# Patient Record
Sex: Female | Born: 1935
Health system: Southern US, Community
[De-identification: ages and names within clinical notes are randomized; demographics above are authoritative.]

## PROBLEM LIST (undated history)

## (undated) DIAGNOSIS — R197 Diarrhea, unspecified: Secondary | ICD-10-CM

## (undated) DIAGNOSIS — R51 Headache: Secondary | ICD-10-CM

## (undated) DIAGNOSIS — K219 Gastro-esophageal reflux disease without esophagitis: Secondary | ICD-10-CM

## (undated) DIAGNOSIS — I7300 Raynaud's syndrome without gangrene: Secondary | ICD-10-CM

## (undated) DIAGNOSIS — Z974 Presence of external hearing-aid: Secondary | ICD-10-CM

## (undated) DIAGNOSIS — I10 Essential (primary) hypertension: Secondary | ICD-10-CM

## (undated) DIAGNOSIS — R0602 Shortness of breath: Secondary | ICD-10-CM

## (undated) DIAGNOSIS — M1990 Unspecified osteoarthritis, unspecified site: Secondary | ICD-10-CM

## (undated) HISTORY — PX: FINGER SURGERY: SHX640

## (undated) HISTORY — DX: Presence of external hearing-aid: Z97.4

## (undated) HISTORY — PX: CATARACT EXTRACTION W/ INTRAOCULAR LENS  IMPLANT, BILATERAL: SHX1307

## (undated) HISTORY — PX: KNEE ARTHROSCOPY: SUR90

## (undated) HISTORY — PX: FACIAL COSMETIC SURGERY: SHX629

## (undated) HISTORY — PX: FOOT SURGERY: SHX648

## (undated) HISTORY — PX: TUBAL LIGATION: SHX77

---
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
## 1998-10-13 ENCOUNTER — Other Ambulatory Visit: Admission: RE | Admit: 1998-10-13 | Discharge: 1998-10-13 | Payer: Self-pay | Admitting: Obstetrics and Gynecology

## 1999-11-29 ENCOUNTER — Other Ambulatory Visit: Admission: RE | Admit: 1999-11-29 | Discharge: 1999-11-29 | Payer: Self-pay | Admitting: Obstetrics and Gynecology

## 2000-02-27 ENCOUNTER — Ambulatory Visit (HOSPITAL_COMMUNITY): Admission: RE | Admit: 2000-02-27 | Discharge: 2000-02-27 | Payer: Self-pay | Admitting: *Deleted

## 2001-02-25 ENCOUNTER — Other Ambulatory Visit: Admission: RE | Admit: 2001-02-25 | Discharge: 2001-02-25 | Payer: Self-pay | Admitting: Obstetrics and Gynecology

## 2002-03-04 ENCOUNTER — Other Ambulatory Visit: Admission: RE | Admit: 2002-03-04 | Discharge: 2002-03-04 | Payer: Self-pay | Admitting: Obstetrics and Gynecology

## 2002-08-05 ENCOUNTER — Ambulatory Visit (HOSPITAL_BASED_OUTPATIENT_CLINIC_OR_DEPARTMENT_OTHER): Admission: RE | Admit: 2002-08-05 | Discharge: 2002-08-06 | Payer: Self-pay | Admitting: Orthopedic Surgery

## 2002-11-16 HISTORY — PX: VAGINAL HYSTERECTOMY: SUR661

## 2002-11-18 ENCOUNTER — Encounter (INDEPENDENT_AMBULATORY_CARE_PROVIDER_SITE_OTHER): Payer: Self-pay | Admitting: Specialist

## 2002-11-18 ENCOUNTER — Observation Stay (HOSPITAL_COMMUNITY): Admission: RE | Admit: 2002-11-18 | Discharge: 2002-11-19 | Payer: Self-pay | Admitting: Obstetrics and Gynecology

## 2003-07-10 ENCOUNTER — Encounter: Payer: Self-pay | Admitting: Diagnostic Radiology

## 2003-07-10 ENCOUNTER — Encounter: Admission: RE | Admit: 2003-07-10 | Discharge: 2003-07-10 | Payer: Self-pay | Admitting: Neurology

## 2003-07-10 ENCOUNTER — Encounter: Payer: Self-pay | Admitting: Neurology

## 2003-08-19 ENCOUNTER — Encounter: Admission: RE | Admit: 2003-08-19 | Discharge: 2003-08-19 | Payer: Self-pay | Admitting: Neurology

## 2003-10-23 ENCOUNTER — Encounter: Admission: RE | Admit: 2003-10-23 | Discharge: 2003-10-23 | Payer: Self-pay | Admitting: Neurology

## 2004-08-01 ENCOUNTER — Ambulatory Visit (HOSPITAL_COMMUNITY): Admission: RE | Admit: 2004-08-01 | Discharge: 2004-08-01 | Payer: Self-pay | Admitting: Neurology

## 2004-10-16 HISTORY — PX: BACK SURGERY: SHX140

## 2005-05-10 ENCOUNTER — Encounter (INDEPENDENT_AMBULATORY_CARE_PROVIDER_SITE_OTHER): Payer: Self-pay | Admitting: Specialist

## 2005-05-10 ENCOUNTER — Ambulatory Visit (HOSPITAL_COMMUNITY): Admission: RE | Admit: 2005-05-10 | Discharge: 2005-05-10 | Payer: Self-pay | Admitting: *Deleted

## 2008-04-16 ENCOUNTER — Other Ambulatory Visit: Admission: RE | Admit: 2008-04-16 | Discharge: 2008-04-16 | Payer: Self-pay | Admitting: Obstetrics & Gynecology

## 2011-03-03 NOTE — Op Note (Signed)
NAMEAYDE, RECORD             ACCOUNT NO.:  [id number]   MEDICAL RECORD NO.:  [id number]          PATIENT TYPE:  AMB   LOCATION:  ENDO                         FACILITY:  [facility]   PHYSICIAN:  Georgiana Spinner, M.D.    DATE OF BIRTH:  [birth year]   DATE OF PROCEDURE:  05/10/2005  DATE OF DISCHARGE:                                 OPERATIVE REPORT   PROCEDURE:  Upper endoscopy.   INDICATIONS:  Gastroesophageal reflux disease.   ANESTHESIA:  Demerol 50, Versed 5 mg.   DESCRIPTION OF PROCEDURE:  With the patient mildly sedated in the left  lateral decubitus position, the Olympus videoscopic endoscope was inserted  in the mouth and passed under direct vision through the esophagus which  appeared normal. We biopsied around the perimeter of the squamocolumnar  junction and then entered into the stomach. The fundus and body appeared  normal. Antrum showed a thickened fold that we photographed and biopsied.  The duodenal bulb and second portion of duodenum appeared normal. From this  point, the endoscope was slowly withdrawn taking circumferential views of  the duodenal mucosa until the endoscope had been pulled back into the  stomach, placed in retroflexion to view the stomach from below and the GE  junction was widely patent lax and we could see up the esophagus. The  endoscope was then straightened and withdrawn taking circumferential views  of the remaining gastric and esophageal mucosa. The patient's vital signs  and pulse oximeter remained stable. The patient tolerated the procedure well  without apparent complications.   FINDINGS:  Erythema of antral fold biopsied, await biopsy report and biopsy  of the distal esophagus, await biopsy report. The patient will call me for  results and follow-up with me as an outpatient. Proceed to colonoscopy as  planned.       GMO/MEDQ  D:  05/10/2005  T:  05/10/2005  Job:  [id number]

## 2011-03-03 NOTE — Op Note (Signed)
[facility]  Patient:    [name], [name]                      MRN: [id number] Adm. Date:  [id number] Attending:  Sabino Gasser                           Operative Report  PROCEDURE:  Colonoscopy.  INDICATIONS:  Rectal bleeding, family history of colon cancer.  ANESTHESIA:  Demerol 40 mg, Versed 5 mg was given intravenously in divided dose.  DESCRIPTION OF PROCEDURE:  With the patient mildly sedated in the left lateral decubitus position, the Olympus videoscopic pediatric colonoscope was inserted into the rectum and passed under direct vision to the cecum.  The cecum was identified by ileocecal valve and appendiceal orifice, both of which were photographed.  We entered into the terminal ileum easily and through the ileocecal valve.  These, too, appeared normal and were photographed.  From this point, the colonoscope was slowly withdrawn, taking circumferential views of the entire colonic mucosa, stopping only then in the rectum which appeared normal under direct view and showed internal hemorrhoids on retroflex view. The endoscope was straightened and withdrawn.  The patients vital signs and pulse oximeter remained stable.  The patient tolerated the procedure well without apparent complications.  FINDINGS:  Internal hemorrhoids, rare diverticulum seen in sigmoid colon; otherwise, unremarkable colonoscopic examination to the cecum.  PLAN:  Repeat examination in three to five years. DD:  02/27/00 TD:  02/28/00 Job: [id number]

## 2011-03-03 NOTE — Op Note (Signed)
NAMEANDRES, [name]                         ACCOUNT NO.:  [id number]   MEDICAL RECORD NO.:  [id number]                   PATIENT TYPE:  AMB   LOCATION:  DSC                                  FACILITY:  [facility]   PHYSICIAN:  Katy Fitch. Naaman Plummer., M.D.          DATE OF BIRTH:  1933/03/01   DATE OF PROCEDURE:  08/05/2002  DATE OF DISCHARGE:                                 OPERATIVE REPORT   PREOPERATIVE DIAGNOSIS:  Destructive arthropathy, right thumb  metacarpophalangeal joint and right index proximal intraphalangeal joint  with components of hypertrophic osteoarthritis and destructive rheumatoid  arthritis.   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   OPERATION:  1. Proximal interphalangeal joint synovectomy and removal of loose bodies     and  hypertrophic osteophytes, right  index  finger followed by proximal     interphalangeal joint implant arthroplasty utilizing a size 10 pyrocarbon     implant set.  2. Arthrodesis of right thumb metacarpophalangeal joint with incidental     synovectomy, loose body excision and 27.5 mm AccuTrac standard sized     screw fixation with 0.025 inch Kirschner wire fixation.   SURGEON:  Katy Fitch. Sypher, M.D.   ASSISTANT:  Jonni Sanger, P.A.   ANESTHESIA:  Axillary block.   SUPERVISING ANESTHESIOLOGIST:  Janetta Hora. Gelene Mink, M.D.   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 She presented for a consultation requesting evaluation and  management options for this predicament. She was noted to have an extremely  destructive and painful arthropathy affecting her thumb, MP and index finger  proximal interphalangeal joints.   We  reviewed options for both predicaments. In the thumb, arthrodesis of the  thumb at the metacarpophalangeal joint in my judgment represents the most  appropriate choice. In the index finger, arthrodesis is a very reasonable  choice; however, due to her desire to maintain some motion of the PIP joint  due to severe arthropathy at the DIP joint as well, we recommended an  attempt at implant arthroplasty of the PIP joint utilizing our new  generation of pyrocarbon implants.  Preoperatively she was advised that this  would be an extremely difficult procedure from a technical standpoint, given  the fact that her intramedullary canals are obstructed with hypertrophic  bone.   After a lengthy informed consent in the office and a second discussion of  these issues prior to surgery, she is brought to the operating room at this  time. She understands that there are potential complications with implant  arthroplasty, including fracture of the implants, loosening, infection,  and/or development of hypertrophic osteophytes rendering the joints stiff  following surgery.  After informed consent and a period of  questions being  invited and answered, she was brought to the operating room at this time.   DESCRIPTION OF PROCEDURE:  The patient was brought to the operating room and  placed in the supine position on the operating table. Following axillary  block in the holding area by Dr. Gelene Mink, anesthesia was satisfactory in  the right arm.  The arm was prepped with Betadine soap solution and  sterilely draped.   Following exsanguination of the right arm with an Esmarch bandage, an  arterial tourniquet on the proximal brachium was inflated to 220 mmHg. The  procedure commenced with a curvilinear incision at the dorsal aspect of the  thumb. The interval between the extensor pollicis brevis and the extensor  pollicis longus was split longitudinally revealing the capsule of the MP  joint. This was bulging  with inflammatory tenosynovium and synovium.   A complete synovectomy of the MP joint was accomplished followed by release  of collateral ligaments. After hypertrophic osteophytes, the loose bodies  were removed. The 12-mm reamers, female and female, were used to shape the  metacarpal head and the proximal phalanx. This would allow arthrodesis of  the joint in approximately 15 to 20 degrees of flexion.   Preoperatively there was a significant radial deviation deformity of the  joint. I elected to correct this approximately 50%. Further correction in my  judgment may alter her pinch pattern due to deformity of the index finger;  therefore, in my judgment, accepting some degree of radial deviation was an  appropriate choice.   The joint was set at 15 degrees flexion and a 27-mm AccuTrac screw was  placed with standard technique, securing the arthrodesis site with excellent  compression. Rotation was controlled with a secondary 0.025 inch Kirschner  wire placed across the joint with x-ray control.   The capsule was then irrigated and repaired with mattress sutures of 3-0  Ethibond, followed by repair of the interval between the extensor pollicis  brevis and the extensor pollicis longus with figure-of-eight sutures of 3-0  Ethibonds, knots buried.  The wound was repaired with intradermal 3-0  Prolene.   Attention was then directed to the index finger PIP joint. There was a very  severe corkscrew deformity of the finger with considerable ulnar deviation  of the PIP joint and radial deviation of the DIP joint with flexion  deformities of both joints.  The PIP joint was exposed through a dorsal  curvilinear incision followed by use of a Chamay apex proximal release of  the central slip. Care was taken to preserve  the insertion of the central  slip at the middle phalanx.   The extremely deformed joint was exposed after partial release of collateral ligaments.  The proximal phalanx was  prepared in the standard manner for a  PIP implant utilizing the pyrocarbon rasps and cutting guides. Likewise the  middle phalanx was  prepared. The middle phalanx was prepared entirely  freehand due to severe deformity requiring a technique of insetting the  distal implant into the metaphyseal segment of the middle phalanx.  Ultimately, size 10 trials were able to be placed anatomically with  excellent correction of the valgus deformity and correction of the rotation  deformity as well.   The implants were placed with no-touch technique, followed by use of triple  antibiotic solution for irrigation. A 0.045  inch Kirschner wire was placed  down the flexor sheath in the manner of Swanson to maintain full extension  of the PIP joint during  extensive tendon healing and repair.   The central slip was then repaired with a reinforcing suture at the  insertion at the middle phalanx due to the thin bone at this level from the  significant hypertrophic osteophytes it had  formed preoperatively. This was  reinforced utilizing a 28 gauge K-wire and a loop mattress suture at the  central slip insertion distally. The central slip was repaired with a series  of  figure-of-eight sutures, knots buried of 3-0 Ethibond. A very  satisfactory reconstruction of the extensor mechanism was accomplished.   The wound was then irrigated with triple antibiotic solution followed by  repair of the skin with intradermal 3-0 Prolene.  The tourniquet was  released prior to closure of the skin  and the index finger. A small  hematoma collected on the dorsal aspect of the thumb MP joint that was  relieved with a hemostat and placement of a vessel loop drain.   The patient tolerated the surgery well. The total tourniquet time was  approximately 2 hours and 10 minutes at 220 mmHg. There were no apparent  complications.                                               Katy Fitch Naaman Plummer., M.D.    RVS/MEDQ  D:   08/05/2002  T:  08/05/2002  Job:  [id number]   cc:   Aundra Dubin, M.D.  [address]  [place]  [place] [address]  Fax: 1

## 2011-03-03 NOTE — Op Note (Signed)
NAMEJELANI, [name]             ACCOUNT NO.:  [id number]   MEDICAL RECORD NO.:  [id number]          PATIENT TYPE:  AMB   LOCATION:  ENDO                         [facility]ACILITY:  [facility]   PHYSICIAN:  Georgiana Spinner, M.D.    DATE OF BIRTH:  Apr 10, 1933   DATE OF PROCEDURE:  DATE OF DISCHARGE:                                 OPERATIVE REPORT   PROCEDURE:  Colonoscopy.   INDICATIONS:  Rectal bleeding, colon polyp.   ANESTHESIA:  Demerol 10, Versed 1 mg.   DESCRIPTION OF PROCEDURE:  With the patient mildly sedated in the left  lateral decubitus position, the Olympus videoscopic colonoscope was inserted  into the rectum and passed under direct vision to the cecum identified by  the ileocecal valve and appendiceal orifice both of which were photographed.  From this point, the colonoscope was slowly withdrawn taking circumferential  views of the colonic mucosa stopping to photograph diverticula seen in the  sigmoid colon until we reached the rectum which appeared normal on direct  and showed hemorrhoids on retroflexed view. The endoscope was straightened  and withdrawn. The patient's vital signs and pulse oximeter remained stable.  The patient tolerated the procedure well without apparent complications.   FINDINGS:  Internal hemorrhoids, diverticulosis of sigmoid colon otherwise  unremarkable exam.   PLAN:  Consider repeat examination in 5-10 years.       GMO/MEDQ  D:  05/10/2005  T:  05/10/2005  Job:  [id number]

## 2011-03-03 NOTE — Op Note (Signed)
[name], [name]                         ACCOUNT NO.:  [id number]   MEDICAL RECORD NO.:  [id number]                   PATIENT TYPE:  OBV   LOCATION:  9399                                 FACILITY:  [facility]   PHYSICIAN:  Laqueta Linden, M.D.                 DATE OF BIRTH:  1933-09-15   DATE OF PROCEDURE:  11/18/2002  DATE OF DISCHARGE:                                 OPERATIVE REPORT   PREOPERATIVE DIAGNOSES:  1. Pelvic relaxation with uterine descensus.  2. Cystocele and rectocele.   POSTOPERATIVE DIAGNOSES:  1. Pelvic relaxation with uterine descensus.  2. Cystocele and rectocele.   PROCEDURE:  Transvaginal hysterectomy, anterior and posterior colporrhaphy.   SURGEON:  Laqueta Linden, M.D.   ASSISTANT:  Andres Ege, M.D.   ANESTHESIA:  General endotracheal anesthesia.   ESTIMATED BLOOD LOSS:  Less than 100 cc.   URINE OUTPUT:  200 cc.   COUNTS:  Correct x2.   COMPLICATIONS:  None.   INDICATIONS FOR PROCEDURE:  The patient is a 77 year old gravida 2, para 2  white female, menopausal, with symptomatic pelvic relaxation. She denies any  stress urinary incontinence.  She has previously undergone tubal ligation  and removal of right ovarian cyst and has had D&C in 1994 and 1996 for  endometrial polyps.  She had complaints of perineal bulging, difficulty in  fecal evacuation and pelvic pressure and discomfort.  She and her husband  have both seen the informed consent films, visualized and expressed their  understanding and exception of all risks, benefits, alternatives and  complications as well as recovery expectations regarding return to full  activity, sexual functioning, as well as travel and agree to proceed.  She  has undergone a mechanical bowel prep and received Ancef 1 g IV antibiotic  prophylaxis preoperatively.  The above named risks include, but are not  limited to, anesthesia risks, infection, bleeding possibly requiring  transfusion, injury to bowel,  bladder, ureters, vessels or nerves;  possibility of fistula formation, possibility of recurrent prolapse,  possibility of development of stress urinary incontinence or postoperative  urinary retention, the possible need for cystoscopy with suprapubic catheter  placement depending on the findings at the time of surgery versus  intermittent self-catheterization.  Other surgical risks including DVT, PE,  pneumonia, death as well as other risks of postoperative sexual dysfunction  including stenosis and pain were also discussed at length with the patient  and her husband who agree to proceed.  The patient has been on Evista and  this was stopped two weeks preoperatively due to the increased DVT risk.  This will be restarted six to eight weeks postoperatively.  Full consent has  been given.  The patient presents now for definitive surgery.   DESCRIPTION OF PROCEDURE:  The patient was taken to the operating room and  after proper identification and consents were ascertained, she was placed on  the  operating table in the supine position.  After the induction of general  endotracheal anesthesia, she was placed in the Chilcoot-Vinton stirrups in the  lithotomy position and the perineum and vagina were prepped and draped in  the routine sterile fashion.  A weighted speculum was placed in the  posterior vagina.  Cervix was grasped with a single-tooth tenaculum and  advanced to the introitus with traction.  The portio was then injected  circumferentially with 1:100,000 solution of epinephrine.  The cervix was  then circumscribed with the scalpel and the vaginal mucosa advanced off of  the cervix using the finger and a Ray-Tec sponge.  The posterior vaginal  mucosa was then tented down and the cul-de-sac entered sharply using curved  Mayo scissors.  The long weighted speculum was then placed.  Uterosacral  ligaments were clamped, cut and ligated with 0 Vicryl suture intact.  Cardinal sutures were similarly  clamped, cut, sutured and tacked.  The  anterior peritoneal reflection was then identified and entered sharply  without obvious injury or entry into the bladder.  A Foley catheter had been  placed and was draining urine throughout the procedure.  Curved Heaney  clamps were then placed across the uterine vessels bilaterally,  incorporating both anterior and posterior peritoneum.  Pedicles are cut and  suture ligated.  The uterine fundus was then flipped posteriorly and clamps  were placed across both adnexa pedicles with excision of the specimen.  Both  tubes and ovaries were visualized.  Both ovaries were tiny and menopausal  and streaked in appearance and were not accessible for removal.  Since they  appeared within normal limits as per discussion with the patient, they were  left in place.  The adnexal pedicles were triply ligated with two free ties  and stitch of 0 Vicryl.  At this point, a McCall suture was placed through  the upper vagina through the uterosacrals with plication of the cul-de-sac  peritoneum to prevent enterocele formation.  This suture was tied at the  very conclusion of the procedure.  The posterior vaginal mucosa was then  roofed to the posterior peritoneum with improvement of hemostasis.  At this  point, the parietal peritoneum was closed in a pursestring fashion.  The  adnexal pedicles were released after confirming hemostasis.  Counts were  correct prior to closure of the peritoneum.  The cardinal ligament tags were  then placed on a Mayo needle and passed into the upper lateral vaginal  angles at 4 and 8 o'clock and tied for upper vaginal support.  The  uterosacral tags were then tied in the midline.  The bottom half of the  vaginal cuff was then closed with figure-of-eight sutures of 0 Vicryl from  side to side.  Attention was then turned to the cystocele.  It is noted that the majority of the patient's cystocele really was the significant cervical  uterine  prolapse.  There was a 2 to 3 cm cystocele high in the vagina  starting at the apex.  This did not extend anywhere near the urethrovesical  junction as demarcated by the Foley bulb.  The vaginal mucosa of the  anterior cuff of the vagina were grasped with Allis clamps and the mucosa  was then injected with sterile saline.  The mucosa was undermined and  incised in the midline for several centimeters to the extent of the  cystocele.  The tissues were sharply and bluntly dissected off of the  paravesical tissues and the cystocele was  then closed in a running locked  fashion.  Redundant vaginal mucosa was trimmed in the anterior vaginal wall  and the remainder of the vaginal cuff was then closed in a running  intermittently locked fashion.  Since there was no suturing or dissection  performed at the urethrovesical junction, the Foley catheter was left in  place and will be removed the day postoperative with anticipation the  patient would void without problems.  This had been discussed with the  patient. She will be taught intermittent self catheterization as needed.   Attention was then turned to the posterior repair. There was a loose three  fingerbreadth opening with a large rectocele noted.  A small inverted  triangular incision was made at the perineum with incision of scar tissue.  The vaginal mucosa was then grasped.  It was injected with sterile saline  and undermined and incised in the midline to the apex of the rectocele which  came to within 1 to 2 cm of the vaginal cuff.  Sharp and blunt dissection  was then used to dissect the perirectal fascia off of the vaginal mucosa.  The posterior fascia was then pulled together in the midline in running  locked fashion with reduction of the defect.  There was no evidence of an  enterocele.  There was no clear cut fascial defect identified.  The repair  of the rectocele was hemostatic and accomplished in a single layer.  Care  was taken not  to overly tighten the introitus or to create a step off.  Redundant vaginal mucosa was trimmed and the posterior vaginal wall was then  closed in a running intermittently locked fashion with routine perineal  closure as well.  Inspection revealed excellent hemostasis.  The bladder had  ongoing drainage of clear urine via the Foley.  A vaginal pack soaked with  estragon cream was then placed to be removed the morning postoperatively.  The patient was awakened and stable on transfer to the PACU.  Total  estimated blood loss was less than  100 cc.  Urine output 200 cc in one and a half hours.  Counts were correct x2.  There  were no complications.  The Foley catheter and pack will be removed the  morning after surgery with anticipation that the patient will void.  If not,  she will be taught intermittent self catheterization.  It was not felt that  suprapubic catheterization was indicated.                                              Laqueta Linden, M.D.    LKS/MEDQ  D:  11/18/2002  T:  11/18/2002  Job:  [id number]

## 2011-05-16 ENCOUNTER — Other Ambulatory Visit: Payer: Self-pay | Admitting: Dermatology

## 2011-07-04 ENCOUNTER — Other Ambulatory Visit: Payer: Self-pay | Admitting: Orthopaedic Surgery

## 2011-07-04 DIAGNOSIS — M25512 Pain in left shoulder: Secondary | ICD-10-CM

## 2011-07-06 ENCOUNTER — Ambulatory Visit
Admission: RE | Admit: 2011-07-06 | Discharge: 2011-07-06 | Disposition: A | Discharge status: Home / Self Care | Payer: Medicare Other | Source: Ambulatory Visit | Attending: Orthopaedic Surgery | Admitting: Orthopaedic Surgery

## 2011-07-06 DIAGNOSIS — M25512 Pain in left shoulder: Secondary | ICD-10-CM

## 2011-08-28 ENCOUNTER — Inpatient Hospital Stay (HOSPITAL_COMMUNITY): Payer: Medicare Other

## 2011-08-28 ENCOUNTER — Encounter (HOSPITAL_COMMUNITY): Payer: Self-pay | Admitting: Internal Medicine

## 2011-08-28 ENCOUNTER — Inpatient Hospital Stay (HOSPITAL_COMMUNITY)
Admission: AD | Admit: 2011-08-28 | Discharge: 2011-09-03 | DRG: 371 | Disposition: A | Discharge status: Home Health | Payer: Medicare Other | Source: Ambulatory Visit | Attending: Internal Medicine | Admitting: Internal Medicine

## 2011-08-28 DIAGNOSIS — N179 Acute kidney failure, unspecified: Secondary | ICD-10-CM | POA: Diagnosis present

## 2011-08-28 DIAGNOSIS — I471 Supraventricular tachycardia, unspecified: Secondary | ICD-10-CM | POA: Diagnosis not present

## 2011-08-28 DIAGNOSIS — M19049 Primary osteoarthritis, unspecified hand: Secondary | ICD-10-CM | POA: Diagnosis not present

## 2011-08-28 DIAGNOSIS — R51 Headache: Secondary | ICD-10-CM

## 2011-08-28 DIAGNOSIS — A020 Salmonella enteritis: Principal | ICD-10-CM | POA: Diagnosis present

## 2011-08-28 DIAGNOSIS — J189 Pneumonia, unspecified organism: Secondary | ICD-10-CM | POA: Diagnosis not present

## 2011-08-28 DIAGNOSIS — D72829 Elevated white blood cell count, unspecified: Secondary | ICD-10-CM | POA: Diagnosis not present

## 2011-08-28 DIAGNOSIS — M5380 Other specified dorsopathies, site unspecified: Secondary | ICD-10-CM | POA: Diagnosis present

## 2011-08-28 DIAGNOSIS — M1990 Unspecified osteoarthritis, unspecified site: Secondary | ICD-10-CM | POA: Insufficient documentation

## 2011-08-28 DIAGNOSIS — R0602 Shortness of breath: Secondary | ICD-10-CM

## 2011-08-28 DIAGNOSIS — Z79899 Other long term (current) drug therapy: Secondary | ICD-10-CM

## 2011-08-28 DIAGNOSIS — M542 Cervicalgia: Secondary | ICD-10-CM | POA: Diagnosis present

## 2011-08-28 DIAGNOSIS — R197 Diarrhea, unspecified: Secondary | ICD-10-CM

## 2011-08-28 DIAGNOSIS — Z87891 Personal history of nicotine dependence: Secondary | ICD-10-CM

## 2011-08-28 DIAGNOSIS — M75102 Unspecified rotator cuff tear or rupture of left shoulder, not specified as traumatic: Secondary | ICD-10-CM | POA: Insufficient documentation

## 2011-08-28 DIAGNOSIS — E860 Dehydration: Secondary | ICD-10-CM | POA: Diagnosis present

## 2011-08-28 DIAGNOSIS — I359 Nonrheumatic aortic valve disorder, unspecified: Secondary | ICD-10-CM | POA: Diagnosis present

## 2011-08-28 DIAGNOSIS — K219 Gastro-esophageal reflux disease without esophagitis: Secondary | ICD-10-CM | POA: Diagnosis present

## 2011-08-28 DIAGNOSIS — M62830 Muscle spasm of back: Secondary | ICD-10-CM | POA: Diagnosis present

## 2011-08-28 HISTORY — DX: Shortness of breath: R06.02

## 2011-08-28 HISTORY — DX: Gastro-esophageal reflux disease without esophagitis: K21.9

## 2011-08-28 HISTORY — DX: Headache: R51

## 2011-08-28 HISTORY — DX: Unspecified osteoarthritis, unspecified site: M19.90

## 2011-08-28 HISTORY — DX: Diarrhea, unspecified: R19.7

## 2011-08-28 LAB — CBC
HCT: 32.9 % — ABNORMAL LOW (ref 36.0–46.0)
Hemoglobin: 11.6 g/dL — ABNORMAL LOW (ref 12.0–15.0)
MCH: 32.4 pg (ref 26.0–34.0)
MCHC: 35.3 g/dL (ref 30.0–36.0)
RBC: 3.58 MIL/uL — ABNORMAL LOW (ref 3.87–5.11)

## 2011-08-28 LAB — DIFFERENTIAL
Band Neutrophils: 0 % (ref 0–10)
Basophils Absolute: 0.1 10*3/uL (ref 0.0–0.1)
Basophils Relative: 2 % — ABNORMAL HIGH (ref 0–1)
Eosinophils Absolute: 0 10*3/uL (ref 0.0–0.7)
Eosinophils Relative: 0 % (ref 0–5)
Lymphocytes Relative: 12 % (ref 12–46)
Lymphs Abs: 0.9 10*3/uL (ref 0.7–4.0)
Neutro Abs: 5.5 10*3/uL (ref 1.7–7.7)
Neutrophils Relative %: 76 % (ref 43–77)
Promyelocytes Absolute: 0 %
WBC Morphology: INCREASED

## 2011-08-28 LAB — COMPREHENSIVE METABOLIC PANEL
ALT: 9 U/L (ref 0–35)
AST: 15 U/L (ref 0–37)
Albumin: 2.1 g/dL — ABNORMAL LOW (ref 3.5–5.2)
Alkaline Phosphatase: 97 U/L (ref 39–117)
BUN: 38 mg/dL — ABNORMAL HIGH (ref 6–23)
CO2: 24 mEq/L (ref 19–32)
Calcium: 9.2 mg/dL (ref 8.4–10.5)
Chloride: 96 mEq/L (ref 96–112)
Creatinine, Ser: 1.89 mg/dL — ABNORMAL HIGH (ref 0.50–1.10)
GFR calc Af Amer: 29 mL/min — ABNORMAL LOW (ref >90)
GFR calc non Af Amer: 25 mL/min — ABNORMAL LOW (ref >90)
Glucose, Bld: 76 mg/dL (ref 70–99)
Potassium: 3.5 mEq/L (ref 3.5–5.1)
Sodium: 133 mEq/L — ABNORMAL LOW (ref 135–145)
Total Bilirubin: 0.4 mg/dL (ref 0.3–1.2)
Total Protein: 6.5 g/dL (ref 6.0–8.3)

## 2011-08-28 LAB — MAGNESIUM: Magnesium: 1.8 mg/dL (ref 1.5–2.5)

## 2011-08-28 MED ORDER — POTASSIUM CHLORIDE IN NACL 20-0.9 MEQ/L-% IV SOLN
INTRAVENOUS | Status: AC
  Administered 2011-08-28: 1000 mL via INTRAVENOUS
  Filled 2011-08-28: qty 1000

## 2011-08-28 MED ORDER — HEPARIN SODIUM (PORCINE) 5000 UNIT/ML IJ SOLN
5000.0000 [IU] | Freq: Three times a day (TID) | INTRAMUSCULAR | Status: DC
  Administered 2011-08-28 – 2011-08-31 (×9): 5000 [IU] via SUBCUTANEOUS
  Filled 2011-08-28 (×13): qty 1

## 2011-08-28 MED ORDER — ALBUTEROL SULFATE (5 MG/ML) 0.5% IN NEBU: 2.5000 mg | INHALATION_SOLUTION | RESPIRATORY_TRACT | Status: DC | PRN

## 2011-08-28 MED ORDER — SENNOSIDES-DOCUSATE SODIUM 8.6-50 MG PO TABS: 1.0000 | ORAL_TABLET | Freq: Every day | ORAL | Status: DC | PRN

## 2011-08-28 MED ORDER — MORPHINE SULFATE 2 MG/ML IJ SOLN
1.0000 mg | INTRAMUSCULAR | Status: DC | PRN
  Administered 2011-08-28 – 2011-09-01 (×10): 1 mg via INTRAVENOUS
  Administered 2011-09-02: 05:00:00 via INTRAVENOUS
  Filled 2011-08-28 (×12): qty 1

## 2011-08-28 MED ORDER — POTASSIUM CHLORIDE IN NACL 20-0.9 MEQ/L-% IV SOLN
INTRAVENOUS | Status: AC
  Administered 2011-08-28: 14:00:00 via INTRAVENOUS
  Filled 2011-08-28 (×2): qty 1000

## 2011-08-28 MED ORDER — BACLOFEN 5 MG HALF TABLET
5.0000 mg | ORAL_TABLET | Freq: Two times a day (BID) | ORAL | Status: DC
  Administered 2011-08-28 – 2011-08-30 (×6): 5 mg via ORAL
  Filled 2011-08-28 (×9): qty 1

## 2011-08-28 MED ORDER — ZOLPIDEM TARTRATE 5 MG PO TABS
5.0000 mg | ORAL_TABLET | Freq: Every evening | ORAL | Status: DC | PRN
  Administered 2011-09-01: 5 mg via ORAL
  Filled 2011-08-28: qty 1

## 2011-08-28 MED ORDER — POTASSIUM CHLORIDE IN NACL 40-0.9 MEQ/L-% IV SOLN
INTRAVENOUS | Status: DC
  Filled 2011-08-28: qty 1000

## 2011-08-28 MED ORDER — BISACODYL 10 MG RE SUPP: 10.0000 mg | Freq: Every day | RECTAL | Status: DC | PRN

## 2011-08-28 MED ORDER — CIPROFLOXACIN HCL 500 MG PO TABS
500.0000 mg | ORAL_TABLET | Freq: Two times a day (BID) | ORAL | Status: DC
  Administered 2011-08-28 – 2011-08-31 (×7): 500 mg via ORAL
  Filled 2011-08-28 (×9): qty 1

## 2011-08-28 MED ORDER — HYDROCODONE-ACETAMINOPHEN 5-325 MG PO TABS
1.0000 | ORAL_TABLET | ORAL | Status: DC | PRN
  Administered 2011-08-28 – 2011-09-03 (×19): 1 via ORAL
  Filled 2011-08-28 (×20): qty 1

## 2011-08-28 NOTE — H&P (Addendum)
[name] [phone] Outpatient Primary MD for the patient is [name] Lance, MD  With History of - Principal Problem:  *Salmonella enteritis Active Problems:  Muscle spasm of back  Dehydration  GERD (gastroesophageal reflux disease)  DJD (degenerative joint disease)  Rotator cuff tear, left   Past Medical History  Diagnosis Date  . GERD (gastroesophageal reflux disease)   . Rotator cuff tear, left      in for   Diarrhea  HPI  [name] CSN:[id number],MRN:[id number] is a 77 y.o. female, who comes as a direct admit after being diagnosed with salmonella enteritis, apparently she had a chef prepared meal at home 2 weeks ago after which she and her husband developed diarrhea, he husband recovered after 3-4 days but she continued to have diarrhea and decreased PO intake, she also started having back and neck spasms since last 2-3 days, saw her PCP and got stool studies and CBC, CBC stable, but stool +ve for Salmonella. She denies any Abd pain, no fever chills, no palpitations, no Blood in stool. No photophobia or focal weakness. No headache.  Review of Systems    In addition to the HPI above,  No Fever-chills, No Headache, No changes with Vision or hearing, No problems swallowing food or Liquids, No Chest pain, Cough or Shortness of Breath, No Abdominal pain, No Nausea or Vommitting, ++ diarrhea No Blood in stool or Urine, No dysuria, No new skin rashes or bruises, No new joints pains-aches,  No new weakness, tingling, numbness in any extremity, No recent weight gain or loss, No polyuria, polydypsia or polyphagia, No significant Mental Stressors.  A full 10 point Review of Systems was done, except as stated above, all other Review of Systems were negative.   Social History History  Substance Use Topics  . Smoking status: Never Smoker   . Smokeless tobacco: Not on file  . Alcohol Use: 3.0 oz/week    5 Glasses of wine per week      Family  History No CAD  Prior to Admission medications   Not on File   Allergies not on file  Physical Exam No intake or output data in the 24 hours ending 08/28/11 1138 1. Blood pressure 99/61, pulse 73, temperature 97.6 F (36.4 C), temperature source Oral, resp. rate 20, height 5\' 3"  (1.6 m), weight 45.813 kg (101 lb), SpO2 99.00%.  General frail thin middle aged female lying in bed in NAD,    2. Normal affect and insight, Not Suicidal or Homicidal, Awake Alert, Oriented *3.  3. No F.N deficits, ALL C.Nerves Intact, Strength 5/5 all 4 extremities, Sensation intact all 4 extremities, Plantars down going.  4. Ears and Eyes appear Normal, Conjunctivae clear, PERRLA. Moist Oral Mucosa.  5. Supple Neck, No JVD, No cervical lymphadenopathy appriciated, No Carotid Bruits.  6. Symmetrical Chest wall movement, Good air movement bilaterally, CTAB.  7. RRR, No Gallops, Rubs or Murmurs, No Parasternal Heave.  8. Positive Bowel Sounds, Abdomen Soft, Non tender, No organomegaly appriciated,  No rebound -guarding or rigidity.  9. No Cyanosis, Normal Skin Turgor, No Skin Rash or Bruise.  10. Good muscle tone,  joints appear normal , no effusions, Normal ROM.  11. No Palpable Lymph Nodes in Neck or Axillae    Data Review  Outpt Labs  Stool salmonella +ve Cipro senstive WBC 10K No BMP  Personally reviewed Old Chart from MRI shows severe L Shoulder R Cuff tear and Old DJD    Assessment & Plan  1. Severe Diarrhea  with Stool salmonella +ve Cipro sensitive - admit, clinically pt is dehydrated, will put on PO Cipro, IVF, check stat BMP and Mag, replace as indicated.   2. Severe Back spasms - old MRI does show DJD, but likely worse spasms due to dehydration - check lytes and IVF. Muscle relaxants and pain control.  3. GERD - no PPI for now, monitor.  4.Old L.R.Cuff tear , DJD - outpt follow.  DVT Prophylaxis Heparin - SCDs   Stat Labs Ordered, also please review Full  Orders  Addendum   -  pts labs back has ARF continue Rx as #1 above.  Admission, patients condition and plan of care including tests being ordered have been discussed with the patient and husband who indicate understanding and agree with the plan.  Code Status Full  Condition Fair The patient is being admitted to the Hospitalist Service.

## 2011-08-29 LAB — URINALYSIS, ROUTINE W REFLEX MICROSCOPIC
Glucose, UA: NEGATIVE mg/dL
Hgb urine dipstick: NEGATIVE
Protein, ur: 30 mg/dL — AB
Specific Gravity, Urine: 1.02 (ref 1.005–1.030)
Urobilinogen, UA: 0.2 mg/dL (ref 0.0–1.0)

## 2011-08-29 LAB — CBC
HCT: 31.3 % — ABNORMAL LOW (ref 36.0–46.0)
MCHC: 34.2 g/dL (ref 30.0–36.0)
Platelets: 634 10*3/uL — ABNORMAL HIGH (ref 150–400)
RDW: 13.5 % (ref 11.5–15.5)
WBC: 7.2 10*3/uL (ref 4.0–10.5)

## 2011-08-29 LAB — URINE MICROSCOPIC-ADD ON

## 2011-08-29 LAB — PROTIME-INR: INR: 1.25 (ref 0.00–1.49)

## 2011-08-29 LAB — BASIC METABOLIC PANEL
BUN: 23 mg/dL (ref 6–23)
Calcium: 8.9 mg/dL (ref 8.4–10.5)
Chloride: 105 mEq/L (ref 96–112)
Creatinine, Ser: 1.04 mg/dL (ref 0.50–1.10)
GFR calc Af Amer: 59 mL/min — ABNORMAL LOW (ref >90)
GFR calc non Af Amer: 51 mL/min — ABNORMAL LOW (ref >90)

## 2011-08-29 LAB — POTASSIUM
Potassium: 5.6 mEq/L — ABNORMAL HIGH (ref 3.5–5.1)
Potassium: 4.2 mEq/L (ref 3.5–5.1)

## 2011-08-29 MED ORDER — PANTOPRAZOLE SODIUM 40 MG PO TBEC
40.0000 mg | DELAYED_RELEASE_TABLET | Freq: Every day | ORAL | Status: DC
  Administered 2011-08-29 – 2011-09-02 (×5): 40 mg via ORAL
  Filled 2011-08-29 (×4): qty 1

## 2011-08-29 MED ORDER — SODIUM POLYSTYRENE SULFONATE 15 GM/60ML PO SUSP
15.0000 g | Freq: Once | ORAL | Status: AC
  Administered 2011-08-29: 15 g via ORAL
  Filled 2011-08-29: qty 60

## 2011-08-29 MED ORDER — SODIUM CHLORIDE 0.9 % IV SOLN
INTRAVENOUS | Status: AC
  Administered 2011-08-29: 11:00:00 via INTRAVENOUS

## 2011-08-29 MED ORDER — MAGNESIUM SULFATE IN D5W 10-5 MG/ML-% IV SOLN
1.0000 g | Freq: Once | INTRAVENOUS | Status: AC
  Administered 2011-08-29: 1 g via INTRAVENOUS
  Filled 2011-08-29: qty 100

## 2011-08-29 MED ORDER — CYCLOBENZAPRINE HCL 5 MG PO TABS
7.5000 mg | ORAL_TABLET | Freq: Three times a day (TID) | ORAL | Status: DC | PRN
  Administered 2011-08-29 – 2011-08-31 (×4): 7.5 mg via ORAL
  Filled 2011-08-29 (×5): qty 1.5

## 2011-08-29 MED ORDER — FUROSEMIDE 10 MG/ML IJ SOLN
10.0000 mg | Freq: Once | INTRAMUSCULAR | Status: AC
  Administered 2011-08-29: 10 mg via INTRAVENOUS
  Filled 2011-08-29: qty 1

## 2011-08-29 NOTE — Progress Notes (Addendum)
[name]:[id number],WJX:[id number] is a 77 y.o. female,  Outpatient Primary MD for the patient is [name], MD No chief complaint on file.  08/29/2011    Assessment & Plan   1. Severe Diarrhea with Stool salmonella +ve Cipro sensitive -   Continue PO Cipro, gentle NS IVF, D/W Dr Maurice March likely to last 3-4 weeks. Outpt stool C diff -ve, repeat stool studies again (PCR) and monitor.  2. Severe Back spasms - old MRI does show DJD, but likely worse spasms due to dehydration - added flexeril to baclofen, Morphine + Oxycodone for pain, give 1 gm IV Mag, monitor lytes. C spine X ray - Chr DJD.  3. GERD - no PPI for now, monitor.   4.Old L.R.Cuff tear , DJD - outpt follow.  5.ARF - resolved with hydration-monitor.  6.K repeat 5.6 - over compensation from IVF, IV Ns+Kcl (order expired 2 am however IVF continued in error  Till 11am) - 15gm PO  kayaxalate now, gentle 10mg  IV lasix, is on IVF, repeat in 6 hrs.  DVT Prophylaxis  Heparin    Lab Results  Component Value Date   WBC 7.2 08/29/2011   HGB 10.7* 08/29/2011   HCT 31.3* 08/29/2011   MCV 94.0 08/29/2011   PLT 634* 08/29/2011      See all Orders from today for further details     Subjective:   [name] today has, No headache, No chest pain, No abdominal pain - No Nausea, No new weakness tingling or numbness, No Cough - SOB.++ back spasms  Objective:   Vital signs in last 24 hours:  Filed Vitals:   08/28/11 1118 08/28/11 1418 08/28/11 2100 08/29/11 0500  BP: 99/61 93/58 103/63 93/58  Pulse: 73 78 79 67  Temp: 97.6 F (36.4 C) 97.5 F (36.4 C) 99.3 F (37.4 C) 97.5 F (36.4 C)  TempSrc: Oral Oral Oral Oral  Resp: 20 20 18 20   Height: 5\' 3"  (1.6 m)     Weight: 45.813 kg (101 lb)     SpO2: 99% 97% 96% 95%    Intake/Output from previous day:   Intake/Output Summary (Last 24 hours) at 08/29/11 1039 Last data filed at 08/29/11 1019  Gross per 24 hour  Intake   3860 ml  Output      0 ml  Net    3860 ml   Exam Awake Alert, Oriented *3, No new F.N deficits, Normal affect No photophobia, no headache [place].AT,PERRAL Supple Neck,No JVD, No cervical lymphadenopathy appriciated.  Symmetrical Chest wall movement, Good air movement bilaterally, CTAB RRR,No Gallops,Rubs or new Murmurs, No Parasternal Heave +ve B.Sounds, Abd Soft, Non tender, No organomegaly appriciated, No rebound -guarding or rigidity. Spine non tender to palpate no obvious deformity on exam.  No Cyanosis, Clubbing or edema, No new Rash or bruise   Data Review   Radiology Reports  Dg Cervical Spine 2-3 Views  08/28/2011  *RADIOLOGY REPORT*  Clinical Data: Posterior neck pain.  Clinical diagnosis of neck spasms.  CERVICAL SPINE - 2-3 VIEW  Comparison: None.  Findings: AP and lateral views of the cervical spine demonstrate reversal of the normal cervical lordosis.  Multilevel degenerative changes are noted as well as levoconvex cervicothoracic scoliosis.  IMPRESSION: Multilevel degenerative changes and reversal of the normal cervical lordosis.  Original Report Authenticated By: Darrol Angel, M.D.    Lab Results:  Micro Results: Recent Results (from the past 240 hour(s))  CULTURE, BLOOD (ROUTINE X 2)     Status: Normal (Preliminary  result)   Collection Time   08/28/11  3:17 PM      Component Value Range Status Comment   Specimen Description BLOOD LEFT ARM   Final    Special Requests BOTTLES DRAWN AEROBIC AND ANAEROBIC 10CC   Final    Setup Time [id number]   Final    Culture     Final    Value:        BLOOD CULTURE RECEIVED NO GROWTH TO DATE CULTURE WILL BE HELD FOR 5 DAYS BEFORE ISSUING A FINAL NEGATIVE REPORT   Report Status PENDING   Incomplete   CULTURE, BLOOD (ROUTINE X 2)     Status: Normal (Preliminary result)   Collection Time   08/28/11  3:24 PM      Component Value Range Status Comment   Specimen Description BLOOD LEFT WRIST   Final    Special Requests BOTTLES DRAWN AEROBIC ONLY [facility]   Final     Setup Time [id number]   Final    Culture     Final    Value:        BLOOD CULTURE RECEIVED NO GROWTH TO DATE CULTURE WILL BE HELD FOR 5 DAYS BEFORE ISSUING A FINAL NEGATIVE REPORT   Report Status PENDING   Incomplete      Basename 08/29/11 0650 08/28/11 1225  NA 136 133*  K 4.8 3.5  CL 105 96  CO2 24 24  GLUCOSE 94 76  BUN 23 38*  CREATININE 1.04 1.89*  CALCIUM 8.9 9.2  MG -- 1.8  PHOS -- --    Basename 08/28/11 1225  AST 15  ALT 9  ALKPHOS 97  BILITOT 0.4  PROT 6.5  ALBUMIN 2.1*   No results found for this basename: LIPASE:2,AMYLASE:2 in the last 72 hours  Basename 08/29/11 0650 08/28/11 1225  WBC 7.2 7.2  NEUTROABS -- 5.5  HGB 10.7* 11.6*  HCT 31.3* 32.9*  MCV 94.0 91.9  PLT 634* 602*   Medications: Scheduled Meds:   . baclofen  5 mg Oral BID  . ciprofloxacin  500 mg Oral BID  . heparin  5,000 Units Subcutaneous Q8H  . magnesium sulfate IVPB  1 g Intravenous Once   Continuous Infusions:   . sodium chloride    . 0.9 % NaCl with KCl 20 mEq / L 1,000 mL (08/28/11 1230)  . 0.9 % NaCl with KCl 20 mEq / L 100 mL/hr at 08/28/11 1400  . DISCONTD: 0.9 % NaCl with KCl 40 mEq / L     PRN Meds:.albuterol, cyclobenzaprine, HYDROcodone-acetaminophen, morphine injection, zolpidem, DISCONTD: bisacodyl, DISCONTD: senna-docusate

## 2011-08-29 NOTE — Progress Notes (Signed)
Pharmacy Consultation--Review Home Meds  Went over home medication list with patient with attention regarding hormone patch and PPI. Patient insisted that her Vivelle-Dot patch was 0.025 mg/24h. Upon calling her husband to verify this dose (could not call pharmacy as she receives it via mail order), it was determined that the patient is actually on Vivelle-Dot 0.0375/24h patches. She changes her patch on Mondays and Fridays. She placed a new patch on yesterday prior to her admission to the hospital.  In regards to her PPI. The patient stated that she took omeprazole (Prilosec) 40 mg daily as stated on her home med reconciliation.   All other medications were reviewed and verified as correct by the patient. The home medication reconciliation was corrected to include the updated Vivelle-Dot dose.  Georgina Pillion, [place] D 08/29/2011 4:13 PM

## 2011-08-30 ENCOUNTER — Other Ambulatory Visit: Payer: Self-pay

## 2011-08-30 LAB — BASIC METABOLIC PANEL
BUN: 15 mg/dL (ref 6–23)
CO2: 24 mEq/L (ref 19–32)
Calcium: 8.8 mg/dL (ref 8.4–10.5)
Chloride: 106 mEq/L (ref 96–112)
Creatinine, Ser: 0.87 mg/dL (ref 0.50–1.10)
Glucose, Bld: 95 mg/dL (ref 70–99)

## 2011-08-30 LAB — GIARDIA/CRYPTOSPORIDIUM SCREEN(EIA): Giardia Screen - EIA: NEGATIVE

## 2011-08-30 LAB — MAGNESIUM: Magnesium: 1.8 mg/dL (ref 1.5–2.5)

## 2011-08-30 MED ORDER — SODIUM CHLORIDE 0.9 % IV BOLUS (SEPSIS)
1000.0000 mL | Freq: Once | INTRAVENOUS | Status: AC
  Administered 2011-08-30: 1000 mL via INTRAVENOUS

## 2011-08-30 MED ORDER — SODIUM CHLORIDE 0.9 % IV SOLN
INTRAVENOUS | Status: DC
  Administered 2011-08-30: 22:00:00 via INTRAVENOUS
  Administered 2011-08-31: 1000 mL via INTRAVENOUS
  Administered 2011-08-31 – 2011-09-02 (×5): via INTRAVENOUS

## 2011-08-30 MED ORDER — ADENOSINE 6 MG/2ML IV SOLN
6.0000 mg | Freq: Once | INTRAVENOUS | Status: DC
  Filled 2011-08-30: qty 2

## 2011-08-30 NOTE — Progress Notes (Signed)
Subjective: 4 loose watery bm's this morning. Abdominal cramps this morning.  Objective: Vital signs in last 24 hours: Filed Vitals:   08/29/11 1456 08/29/11 2142 08/30/11 0553 08/30/11 1411  BP: 110/64 95/55 104/66 114/57  Pulse: 76 77 73 85  Temp: 98.2 F (36.8 C) 98.3 F (36.8 C) 98.3 F (36.8 C) 98.7 F (37.1 C)  TempSrc: Oral Oral Oral Oral  Resp: 20 21 20 18   Height:      Weight:   49.533 kg (109 lb 3.2 oz)   SpO2: 94% 96% 94% 94%   Weight change: 3.719 kg (8 lb 3.2 oz)  Intake/Output Summary (Last 24 hours) at 08/30/11 1543 Last data filed at 08/29/11 2100  Gross per 24 hour  Intake 481.66 ml  Output      0 ml  Net 481.66 ml   Physical Exam:   General Appearance:    Alert, cooperative, no distress, appears stated age  Lungs:     Clear to auscultation bilaterally, respirations unlabored   Heart:    Regular rate and rhythm, S1 and S2 normal, no murmur, rub   or gallop  Abdomen:     Soft, mildly tender lower quadrant. Bowel sounds heard.   Extremities:   Extremities normal, atraumatic, no cyanosis or edema              Lab Results: . Results for orders placed during the hospital encounter of 08/28/11 (from the past 24 hour(s))  URINALYSIS, ROUTINE W REFLEX MICROSCOPIC     Status: Abnormal   Collection Time   08/29/11  3:58 PM      Component Value Range   Color, Urine YELLOW  YELLOW    Appearance HAZY (*) CLEAR    Specific Gravity, Urine 1.020  1.005 - 1.030    pH 5.0  5.0 - 8.0    Glucose, UA NEGATIVE  NEGATIVE (mg/dL)   Hgb urine dipstick NEGATIVE  NEGATIVE    Bilirubin Urine NEGATIVE  NEGATIVE    Ketones, ur NEGATIVE  NEGATIVE (mg/dL)   Protein, ur 30 (*) NEGATIVE (mg/dL)   Urobilinogen, UA 0.2  0.0 - 1.0 (mg/dL)   Nitrite NEGATIVE  NEGATIVE    Leukocytes, UA SMALL (*) NEGATIVE   URINE MICROSCOPIC-ADD ON     Status: Abnormal   Collection Time   08/29/11  3:58 PM      Component Value Range   Squamous Epithelial / LPF FEW (*) RARE    WBC, UA  3-6  <3 (WBC/hpf)   RBC / HPF 0-2  <3 (RBC/hpf)   Bacteria, UA FEW (*) RARE    Casts GRANULAR CAST (*) NEGATIVE   CLOSTRIDIUM DIFFICILE BY PCR     Status: Normal   Collection Time   08/29/11  7:05 PM      Component Value Range   C difficile by pcr NEGATIVE  NEGATIVE   STOOL CULTURE     Status: Normal (Preliminary result)   Collection Time   08/29/11  7:05 PM      Component Value Range   Specimen Description STOOL     Special Requests Normal     Culture Culture reincubated for better growth     Report Status PENDING    GIARDIA/CRYPTOSPORIDIUM SCREEN(EIA)     Status: Normal   Collection Time   08/29/11  7:18 PM      Component Value Range   Giardia Screen - EIA NEGATIVE     Cryptosporidium Screen (EIA) NEGATIVE     Specimen  Source-GICRSC STOOL    POTASSIUM     Status: Normal   Collection Time   08/29/11  8:45 PM      Component Value Range   Potassium 4.2  3.5 - 5.1 (mEq/L)  MAGNESIUM     Status: Normal   Collection Time   08/30/11  6:21 AM      Component Value Range   Magnesium 1.8  1.5 - 2.5 (mg/dL)  BASIC METABOLIC PANEL     Status: Abnormal   Collection Time   08/30/11  6:21 AM      Component Value Range   Sodium 136  135 - 145 (mEq/L)   Potassium 4.0  3.5 - 5.1 (mEq/L)   Chloride 106  96 - 112 (mEq/L)   CO2 24  19 - 32 (mEq/L)   Glucose, Bld 95  70 - 99 (mg/dL)   BUN 15  6 - 23 (mg/dL)   Creatinine, Ser 7.82  0.50 - 1.10 (mg/dL)   Calcium 8.8  8.4 - 95.6 (mg/dL)   GFR calc non Af Amer 63 (*) >90 (mL/min)   GFR calc Af Amer 73 (*) >90 (mL/min)    Micro Results: Recent Results (from the past 240 hour(s))  CULTURE, BLOOD (ROUTINE X 2)     Status: Normal (Preliminary result)   Collection Time   08/28/11  3:17 PM      Component Value Range Status Comment   Specimen Description BLOOD LEFT ARM   Final    Special Requests BOTTLES DRAWN AEROBIC AND ANAEROBIC 10CC   Final    Setup Time [id number]   Final    Culture     Final    Value:        BLOOD CULTURE RECEIVED  NO GROWTH TO DATE CULTURE WILL BE HELD FOR 5 DAYS BEFORE ISSUING A FINAL NEGATIVE REPORT   Report Status PENDING   Incomplete   CULTURE, BLOOD (ROUTINE X 2)     Status: Normal (Preliminary result)   Collection Time   08/28/11  3:24 PM      Component Value Range Status Comment   Specimen Description BLOOD LEFT WRIST   Final    Special Requests BOTTLES DRAWN AEROBIC ONLY 7CC   Final    Setup Time [id number]   Final    Culture     Final    Value:        BLOOD CULTURE RECEIVED NO GROWTH TO DATE CULTURE WILL BE HELD FOR 5 DAYS BEFORE ISSUING A FINAL NEGATIVE REPORT   Report Status PENDING   Incomplete   CLOSTRIDIUM DIFFICILE BY PCR     Status: Normal   Collection Time   08/29/11  7:05 PM      Component Value Range Status Comment   C difficile by pcr NEGATIVE  NEGATIVE  Final   STOOL CULTURE     Status: Normal (Preliminary result)   Collection Time   08/29/11  7:05 PM      Component Value Range Status Comment   Specimen Description STOOL   Final    Special Requests Normal   Final    Culture Culture reincubated for better growth   Final    Report Status PENDING   Incomplete    Studies/Results: Dg Cervical Spine 2-3 Views  08/28/2011  *RADIOLOGY REPORT*  Clinical Data: Posterior neck pain.  Clinical diagnosis of neck spasms.  CERVICAL SPINE - 2-3 VIEW  Comparison: None.  Findings: AP and lateral views of the cervical spine demonstrate reversal  of the normal cervical lordosis.  Multilevel degenerative changes are noted as well as levoconvex cervicothoracic scoliosis.  IMPRESSION: Multilevel degenerative changes and reversal of the normal cervical lordosis.  Original Report Authenticated By: Darrol Angel, M.D.   Medications: reviewed.  Scheduled Meds:   . baclofen  5 mg Oral BID  . ciprofloxacin  500 mg Oral BID  . furosemide  10 mg Intravenous Once  . heparin  5,000 Units Subcutaneous Q8H  . pantoprazole  40 mg Oral Q1200  . sodium polystyrene  15 g Oral Once   Continuous  Infusions:   . sodium chloride 50 mL/hr at 08/29/11 1122   PRN Meds:.albuterol, cyclobenzaprine, HYDROcodone-acetaminophen, morphine injection, zolpidem Assessment/Plan: Principal Problem:  *Salmonella enteritis on po  antibiotics ( ciprofloxacin) , persitent diarrhea. Stool cultures pending. Continue with antibiotics and iv fluids.   Active Problems:  Muscle spasm of back secondary to the DJD.Pt is already on baclofen and flexeril.   Dehydration: on iv fluids.   GERD (gastroesophageal reflux disease): on protonix.  Osteoarthritis Pain control as needed.     LOS: 2 days   [name] 08/30/2011, 3:43 PM

## 2011-08-30 NOTE — Progress Notes (Signed)
Utilization Review Completed.[name] T11/14/2012   

## 2011-08-30 NOTE — Progress Notes (Signed)
Was called by patient's nurse to see patient at bedside tonight secondary to concerns of hypotension (blood pressure 76/62) and tachycardia (heart rate as high as 200). It appears patient has been in the hospital for 2 days with profuse watery diarrhea secondary to Salmonella. Patient had not been on IV fluids. A 1 L saline bolus was ordered and a stat EKG as well. EKG was concerning for SVT. Order for 6 mg of IV adenosine has been given. I have written orders for transfer to ICU for continuous monitorization. I have spoken with Dr. Danise Mina with [facility] who will accept patient in transfer.

## 2011-08-31 ENCOUNTER — Inpatient Hospital Stay (HOSPITAL_COMMUNITY): Payer: Medicare Other

## 2011-08-31 DIAGNOSIS — M542 Cervicalgia: Secondary | ICD-10-CM | POA: Diagnosis present

## 2011-08-31 DIAGNOSIS — M19049 Primary osteoarthritis, unspecified hand: Secondary | ICD-10-CM | POA: Diagnosis not present

## 2011-08-31 DIAGNOSIS — A020 Salmonella enteritis: Secondary | ICD-10-CM

## 2011-08-31 DIAGNOSIS — I471 Supraventricular tachycardia: Secondary | ICD-10-CM | POA: Diagnosis not present

## 2011-08-31 DIAGNOSIS — A0223 Salmonella arthritis: Secondary | ICD-10-CM

## 2011-08-31 LAB — COMPREHENSIVE METABOLIC PANEL
ALT: 23 U/L (ref 0–35)
Albumin: 1.6 g/dL — ABNORMAL LOW (ref 3.5–5.2)
Alkaline Phosphatase: 151 U/L — ABNORMAL HIGH (ref 39–117)
Chloride: 104 mEq/L (ref 96–112)
GFR calc Af Amer: 72 mL/min — ABNORMAL LOW (ref >90)
Glucose, Bld: 111 mg/dL — ABNORMAL HIGH (ref 70–99)
Potassium: 4.1 mEq/L (ref 3.5–5.1)
Sodium: 136 mEq/L (ref 135–145)
Total Bilirubin: 0.3 mg/dL (ref 0.3–1.2)
Total Protein: 5.2 g/dL — ABNORMAL LOW (ref 6.0–8.3)

## 2011-08-31 LAB — CBC
HCT: 29.6 % — ABNORMAL LOW (ref 36.0–46.0)
Hemoglobin: 10.2 g/dL — ABNORMAL LOW (ref 12.0–15.0)
RBC: 3.16 MIL/uL — ABNORMAL LOW (ref 3.87–5.11)

## 2011-08-31 LAB — LACTATE DEHYDROGENASE: LDH: 143 U/L (ref 94–250)

## 2011-08-31 LAB — LACTIC ACID, PLASMA: Lactic Acid, Venous: 0.8 mmol/L (ref 0.5–2.2)

## 2011-08-31 LAB — RHEUMATOID FACTOR: Rhuematoid fact SerPl-aCnc: 13 IU/mL (ref <=14)

## 2011-08-31 LAB — HEPARIN LEVEL (UNFRACTIONATED): Heparin Unfractionated: <0.1 IU/mL — ABNORMAL LOW (ref 0.30–0.70)

## 2011-08-31 LAB — CARDIAC PANEL(CRET KIN+CKTOT+MB+TROPI)
CK, MB: 2.8 ng/mL (ref 0.3–4.0)
CK, MB: 4.4 ng/mL — ABNORMAL HIGH (ref 0.3–4.0)
CK, MB: 4 ng/mL (ref 0.3–4.0)
Relative Index: INVALID (ref 0.0–2.5)
Total CK: 83 U/L (ref 7–177)
Troponin I: 0.61 ng/mL (ref <0.30)
Troponin I: 0.6 ng/mL (ref <0.30)

## 2011-08-31 LAB — T4, FREE: Free T4: 1.09 ng/dL (ref 0.80–1.80)

## 2011-08-31 LAB — PRO B NATRIURETIC PEPTIDE: Pro B Natriuretic peptide (BNP): 1203 pg/mL — ABNORMAL HIGH (ref 0–450)

## 2011-08-31 LAB — LIPASE, BLOOD: Lipase: 28 U/L (ref 11–59)

## 2011-08-31 LAB — GLUCOSE, CAPILLARY

## 2011-08-31 MED ORDER — HEPARIN BOLUS VIA INFUSION
1500.0000 [IU] | Freq: Once | INTRAVENOUS | Status: AC
  Administered 2011-08-31: 1500 [IU] via INTRAVENOUS
  Filled 2011-08-31: qty 1500

## 2011-08-31 MED ORDER — BACLOFEN 5 MG HALF TABLET
5.0000 mg | ORAL_TABLET | Freq: Three times a day (TID) | ORAL | Status: DC
  Administered 2011-08-31 – 2011-09-03 (×9): 5 mg via ORAL
  Filled 2011-08-31 (×12): qty 1

## 2011-08-31 MED ORDER — HEPARIN (PORCINE) IN NACL 100-0.45 UNIT/ML-% IJ SOLN
1100.0000 [IU]/h | INTRAMUSCULAR | Status: DC
  Administered 2011-08-31: 700 [IU]/h via INTRAVENOUS
  Filled 2011-08-31 (×2): qty 250

## 2011-08-31 MED ORDER — CEFTRIAXONE SODIUM 2 G IJ SOLR
2.0000 g | INTRAMUSCULAR | Status: DC
  Administered 2011-08-31 – 2011-09-01 (×2): 2 g via INTRAVENOUS
  Filled 2011-08-31 (×2): qty 2

## 2011-08-31 MED ORDER — MAGNESIUM SULFATE 40 MG/ML IJ SOLN
2.0000 g | Freq: Once | INTRAMUSCULAR | Status: AC
  Administered 2011-08-31: 2 g via INTRAVENOUS
  Filled 2011-08-31 (×3): qty 50

## 2011-08-31 MED ORDER — MAGNESIUM SULFATE 50 % IJ SOLN: 2.0000 g | Freq: Once | INTRAVENOUS | Status: DC

## 2011-08-31 NOTE — Progress Notes (Signed)
ANTICOAGULATION CONSULT NOTE - Follow Up Consult  Pharmacy Consult for heparin Indication: chest pain/ACS  No Known Allergies  Patient Measurements: Height: 5\' 3"  (160 cm) Weight: 109 lb 9.1 oz (49.7 kg) IBW/kg (Calculated) : 52.4   Vital Signs: Temp: 98.7 F (37.1 C) (11/15 1703) Temp src: Oral (11/15 1703) BP: 115/60 mmHg (11/15 1703) Pulse Rate: 80  (11/15 1703)  Labs:  Basename 08/31/11 1803 08/31/11 1615 08/31/11 1300 08/31/11 0113 08/31/11 0058 08/30/11 0621 08/29/11 0650  HGB -- -- -- -- 10.2* -- 10.7*  HCT -- -- -- -- 29.6* -- 31.3*  PLT -- -- -- -- 641* -- 634*  APTT -- -- -- -- -- -- --  LABPROT -- -- -- -- -- -- 16.0*  INR -- -- -- -- -- -- 1.25  HEPARINUNFRC <0.10* -- -- -- -- -- --  CREATININE -- -- -- -- 0.88 0.87 1.04  CKTOTAL -- 108 83 31 -- -- --  CKMB -- 4.0 4.4* 2.8 -- -- --  TROPONINI -- 0.51* 0.60* 0.61* -- -- --   Estimated Creatinine Clearance: 42.7 ml/min (by C-G formula based on Cr of 0.88).   Medications:  Prescriptions prior to admission  Medication Sig Dispense Refill  . ACAI PO Take 1 tablet by mouth daily.        . Ascorbic Acid (VITAMIN C) 1000 MG tablet Take 1,000 mg by mouth daily.        Marland Kitchen BIOTIN PO Take 1 tablet by mouth daily.        . calcium citrate-vitamin D 200-200 MG-UNIT TABS Take 1 tablet by mouth daily.        . Coenzyme Q10 (CO Q 10 PO) Take 1 tablet by mouth daily.        Marland Kitchen estradiol (VIVELLE-DOT) 0.0375 MG/24HR Place 1 patch onto the skin 2 (two) times a week. Patient changes patch on Mondays and Fridays       . fish oil-omega-3 fatty acids 1000 MG capsule Take 2 g by mouth daily.        . Multiple Vitamins-Minerals (MULTIVITAMINS THER. W/MINERALS) TABS Take 1 tablet by mouth daily.        Marland Kitchen omeprazole (PRILOSEC) 40 MG capsule Take 40 mg by mouth daily.        Marland Kitchen OVER THE COUNTER MEDICATION Take 2 tablets by mouth daily. Osteo biflex        Infusions:    . sodium chloride 150 mL/hr at 08/31/11 1314  . heparin 7 mL/hr  (08/31/11 1314)    Assessment: Pt started on heparin for NSTEMI in the setting of SVT. Heparin is undetectable. No bleeding or problems noted.    Goal of Therapy:  Heparin level 0.3-0.7 units/ml   Plan:  Heparin bolus 1500units IV x 1 Increase heparin gtt to 900units/hr Check 8 hour heparin level  Koree Schopf, Drake Leach 08/31/2011,7:25 PM

## 2011-08-31 NOTE — Progress Notes (Signed)
PHARMACY - CRITICAL CARE PROGRESS NOTE  Pharmacy Consult for Pharmaceutical Care  Indication: Critical Care   No Known Allergies  Patient Measurements: Height: 5\' 3"  (160 cm) Weight: 109 lb 9.1 oz (49.7 kg) IBW/kg (Calculated) : 52.4  Dosing weight: 49.7 Kg  Vital Signs: Temp: 98.1 F (36.7 C) (11/15 0447) Temp src: Oral (11/15 0447) BP: 102/47 mmHg (11/15 0600) Pulse Rate: 80  (11/15 0600) Intake/Output from previous day: 11/14 0701 - 11/15 0700 In: 3365 [P.O.:1160; I.V.:1205; IV Piggyback:1000] Out: -  Intake/Output from this shift:   Vent settings for last 24 hours:    Labs:  [facility] 08/31/11 0058 08/30/11 0621 08/29/11 0650 08/28/11 1225  WBC 9.5 -- 7.2 7.2  HGB 10.2* -- 10.7* 11.6*  HCT 29.6* -- 31.3* 32.9*  PLT 641* -- 634* 602*  APTT -- -- -- --  INR -- -- 1.25 --  CREATININE 0.88 0.87 1.04 --  LABCREA -- -- -- --  CREATININE 0.88 0.87 1.04 --  LABCREA -- -- -- --  CREAT24HRUR -- -- -- --  MG 1.6 1.8 -- 1.8  PHOS 3.7 -- -- --  ALBUMIN 1.6* -- -- 2.1*  PROT 5.2* -- -- 6.5  AST 41* -- -- 15  ALT 23 -- -- 9  ALKPHOS 151* -- -- 97  BILITOT 0.3 -- -- 0.4  BILIDIR -- -- -- --  IBILI -- -- -- --   Estimated Creatinine Clearance: 42.7 ml/min (by C-G formula based on Cr of 0.88).  Basename 08/31/11 0456 08/30/11 2258  GLUCAP 125* 133*    Microbiology: Recent Results (from the past 720 hour(s))  CULTURE, BLOOD (ROUTINE X 2)     Status: Normal (Preliminary result)   Collection Time   08/28/11  3:17 PM      Component Value Range Status Comment   Specimen Description BLOOD LEFT ARM   Final    Special Requests BOTTLES DRAWN AEROBIC AND ANAEROBIC 10CC   Final    Setup Time [id number]   Final    Culture     Final    Value:        BLOOD CULTURE RECEIVED NO GROWTH TO DATE CULTURE WILL BE HELD FOR 5 DAYS BEFORE ISSUING A FINAL NEGATIVE REPORT   Report Status PENDING   Incomplete   CULTURE, BLOOD (ROUTINE X 2)     Status: Normal (Preliminary result)   Collection Time   08/28/11  3:24 PM      Component Value Range Status Comment   Specimen Description BLOOD LEFT WRIST   Final    Special Requests BOTTLES DRAWN AEROBIC ONLY 7CC   Final    Setup Time [id number]   Final    Culture     Final    Value:        BLOOD CULTURE RECEIVED NO GROWTH TO DATE CULTURE WILL BE HELD FOR 5 DAYS BEFORE ISSUING A FINAL NEGATIVE REPORT   Report Status PENDING   Incomplete   CLOSTRIDIUM DIFFICILE BY PCR     Status: Normal   Collection Time   08/29/11  7:05 PM      Component Value Range Status Comment   C difficile by pcr NEGATIVE  NEGATIVE  Final   STOOL CULTURE     Status: Normal (Preliminary result)   Collection Time   08/29/11  7:05 PM      Component Value Range Status Comment   Specimen Description STOOL   Final    Special Requests Normal   Final  Culture Culture reincubated for better growth   Final    Report Status PENDING   Incomplete     Medications:  Prescriptions prior to admission  Medication Sig Dispense Refill  . ACAI PO Take 1 tablet by mouth daily.        . Ascorbic Acid (VITAMIN C) 1000 MG tablet Take 1,000 mg by mouth daily.        Marland Kitchen BIOTIN PO Take 1 tablet by mouth daily.        . calcium citrate-vitamin D 200-200 MG-UNIT TABS Take 1 tablet by mouth daily.        . Coenzyme Q10 (CO Q 10 PO) Take 1 tablet by mouth daily.        Marland Kitchen estradiol (VIVELLE-DOT) 0.0375 MG/24HR Place 1 patch onto the skin 2 (two) times a week. Patient changes patch on Mondays and Fridays       . fish oil-omega-3 fatty acids 1000 MG capsule Take 2 g by mouth daily.        . Multiple Vitamins-Minerals (MULTIVITAMINS THER. W/MINERALS) TABS Take 1 tablet by mouth daily.        Marland Kitchen omeprazole (PRILOSEC) 40 MG capsule Take 40 mg by mouth daily.        Marland Kitchen OVER THE COUNTER MEDICATION Take 2 tablets by mouth daily. Osteo biflex        Scheduled:    . adenosine (ADENOCARD) IV  6 mg Intravenous Once  . baclofen  5 mg Oral TID  . ciprofloxacin  500 mg Oral BID  .  heparin  5,000 Units Subcutaneous Q8H  . magnesium sulfate IVPB  2 g Intravenous Once  . pantoprazole  40 mg Oral Q1200  . sodium chloride  1,000 mL Intravenous Once  . DISCONTD: baclofen  5 mg Oral BID  . DISCONTD: magnesium sulfate infusion  2 g Intravenous Once   Infusions:    . sodium chloride 150 mL/hr at 08/31/11 0600    Assessment: 77 yo F admitted 11/12 with ongoing diarrhea, dehydration and acute renal failure positive for salmonella  Patient Active Problem List  Diagnoses  . Salmonella enteritis  . Muscle spasm of back  . Dehydration  . GERD (gastroesophageal reflux disease)  . DJD (degenerative joint disease)  . Rotator cuff tear, left  . ARF (acute renal failure)  . Neck pain, acute  . Arthritis of hand  . Supraventricular tachycardia   Pharmacy Problem List: Supraventricular tachycardia: experienced SVT and hypotension last night, adenosine ordered by not given as patient converted to NSR, now troponin elevated to 0.61, to start heparin Salmonella Enteritis: D# 3/?  Cipro, on full liquids advancing diet as tolerated ARF:  Recovering, Estimated Creatinine Clearance: 42.7 ml/min (by C-G formula based on Cr of 0.88). all medications renally adjusted appropriately, Mg 1.6, repletion today Acute Neck Pain, DJD: Baclofen TID, PRN vicodin, pain remains 8-10/10, evaluating MRI GERD: Protonix 40 mg PO daily DVT Prophylaxsis: Heparin 5000 units SQ q8h, platelets and CBC Stable  Goal of Therapy:  Heparin level 0.3-0.7  Plan:  1.DC SQ Heparin,  Heparin 1500 units IV x1 then 700 units/hr 2. Check heparin level 8h after heparin started 2. Consider stopping cipro after 7 day course unless otherwise clinically indicated  Lovenia Kim Pharm.D., BCPS 08/31/2011 9:35 AM [phone]

## 2011-08-31 NOTE — Progress Notes (Signed)
Subjective:   She states she had a rough night. No diarrhea since yesterday morning.  Overnight patient became hypotensive, went into paroxysmal supraventricular tachycardia, converted to sinus, has been in sinus since then. BP s improved to 100/50's.  Bilateral Interphalyngeal and metacarpo phal joints swelling has worsened. Neck pain and back spasm's are the same as per the patient. Objective: Vital signs in last 24 hours: Filed Vitals:   08/31/11 0400 08/31/11 0447 08/31/11 0500 08/31/11 0600  BP: 104/46  118/56 102/47  Pulse: 75  83 80  Temp:  98.1 F (36.7 C)    TempSrc:  Oral    Resp: 21  22 17   Height:      Weight:   49.7 kg (109 lb 9.1 oz)   SpO2: 100%  99% 97%   Weight change: 0.167 kg (5.9 oz)  Intake/Output Summary (Last 24 hours) at 08/31/11 1478 Last data filed at 08/31/11 0600  Gross per 24 hour  Intake   3365 ml  Output      0 ml  Net   3365 ml   Physical Exam:   General Appearance:    Alert, cooperative,   Lungs:     Clear to auscultation bilaterally, respirations unlabored   Heart:    Regular rate and rhythm, S1 and S2 normal,   Abdomen:     Soft, non-tender, bowel sounds active all four quadrants,    no masses, no organomegaly  Extremities:   Extremities normal, atraumatic, no cyanosis or edema, b/l upper extremities shows interphalangeal and metacarpo phalangeal joint swelling, erythema and tenderness.   Pulses:   2+ and symmetric all extremities  Skin:   Skin color, texture, turgor normal, no rashes or lesions  Neuro : no focal deficits.   Lab Results: Results for orders placed during the hospital encounter of 08/28/11 (from the past 24 hour(s))  GLUCOSE, CAPILLARY     Status: Abnormal   Collection Time   08/30/11 10:58 PM      Component Value Range   Glucose-Capillary 133 (*) 70 - 99 (mg/dL)  COMPREHENSIVE METABOLIC PANEL     Status: Abnormal   Collection Time   08/31/11 12:58 AM      Component Value Range   Sodium 136  135 - 145 (mEq/L)   Potassium 4.1  3.5 - 5.1 (mEq/L)   Chloride 104  96 - 112 (mEq/L)   CO2 23  19 - 32 (mEq/L)   Glucose, Bld 111 (*) 70 - 99 (mg/dL)   BUN 12  6 - 23 (mg/dL)   Creatinine, Ser 2.95  0.50 - 1.10 (mg/dL)   Calcium 8.3 (*) 8.4 - 10.5 (mg/dL)   Total Protein 5.2 (*) 6.0 - 8.3 (g/dL)   Albumin 1.6 (*) 3.5 - 5.2 (g/dL)   AST 41 (*) 0 - 37 (U/L)   ALT 23  0 - 35 (U/L)   Alkaline Phosphatase 151 (*) 39 - 117 (U/L)   Total Bilirubin 0.3  0.3 - 1.2 (mg/dL)   GFR calc non Af Amer 62 (*) >90 (mL/min)   GFR calc Af Amer 72 (*) >90 (mL/min)  MAGNESIUM     Status: Normal   Collection Time   08/31/11 12:58 AM      Component Value Range   Magnesium 1.6  1.5 - 2.5 (mg/dL)  PHOSPHORUS     Status: Normal   Collection Time   08/31/11 12:58 AM      Component Value Range   Phosphorus 3.7  2.3 - 4.6 (  mg/dL)  LACTATE DEHYDROGENASE     Status: Normal   Collection Time   08/31/11 12:58 AM      Component Value Range   LD 143  94 - 250 (U/L)  CBC     Status: Abnormal   Collection Time   08/31/11 12:58 AM      Component Value Range   WBC 9.5  4.0 - 10.5 (K/uL)   RBC 3.16 (*) 3.87 - 5.11 (MIL/uL)   Hemoglobin 10.2 (*) 12.0 - 15.0 (g/dL)   HCT 16.1 (*) 09.6 - 46.0 (%)   MCV 93.7  78.0 - 100.0 (fL)   MCH 32.3  26.0 - 34.0 (pg)   MCHC 34.5  30.0 - 36.0 (g/dL)   RDW 04.5  40.9 - 81.1 (%)   Platelets 641 (*) 150 - 400 (K/uL)  LIPASE, BLOOD     Status: Normal   Collection Time   08/31/11 12:58 AM      Component Value Range   Lipase 28  11 - 59 (U/L)  PRO B NATRIURETIC PEPTIDE     Status: Abnormal   Collection Time   08/31/11 12:59 AM      Component Value Range   BNP, POC 1203.0 (*) 0 - 450 (pg/mL)  LACTIC ACID, PLASMA     Status: Normal   Collection Time   08/31/11  1:12 AM      Component Value Range   Lactic Acid, Venous 0.8  0.5 - 2.2 (mmol/L)  PROCALCITONIN     Status: Normal   Collection Time   08/31/11  1:13 AM      Component Value Range   Procalcitonin 0.16    CARDIAC PANEL(CRET  KIN+CKTOT+MB+TROPI)     Status: Abnormal   Collection Time   08/31/11  1:13 AM      Component Value Range   Total CK 31  7 - 177 (U/L)   CK, MB 2.8  0.3 - 4.0 (ng/mL)   Troponin I 0.61 (*) <0.30 (ng/mL)   Relative Index RELATIVE INDEX IS INVALID  0.0 - 2.5   GLUCOSE, CAPILLARY     Status: Abnormal   Collection Time   08/31/11  4:56 AM      Component Value Range   Glucose-Capillary 125 (*) 70 - 99 (mg/dL)   Comment 1 Documented in Chart     Comment 2 Notify RN     Micro Results: Recent Results (from the past 240 hour(s))  CULTURE, BLOOD (ROUTINE X 2)     Status: Normal (Preliminary result)   Collection Time   08/28/11  3:17 PM      Component Value Range Status Comment   Specimen Description BLOOD LEFT ARM   Final    Special Requests BOTTLES DRAWN AEROBIC AND ANAEROBIC 10CC   Final    Setup Time [id number]   Final    Culture     Final    Value:        BLOOD CULTURE RECEIVED NO GROWTH TO DATE CULTURE WILL BE HELD FOR 5 DAYS BEFORE ISSUING A FINAL NEGATIVE REPORT   Report Status PENDING   Incomplete   CULTURE, BLOOD (ROUTINE X 2)     Status: Normal (Preliminary result)   Collection Time   08/28/11  3:24 PM      Component Value Range Status Comment   Specimen Description BLOOD LEFT WRIST   Final    Special Requests BOTTLES DRAWN AEROBIC ONLY [facility]   Final    Setup Time [id number]  Final    Culture     Final    Value:        BLOOD CULTURE RECEIVED NO GROWTH TO DATE CULTURE WILL BE HELD FOR 5 DAYS BEFORE ISSUING A FINAL NEGATIVE REPORT   Report Status PENDING   Incomplete   CLOSTRIDIUM DIFFICILE BY PCR     Status: Normal   Collection Time   08/29/11  7:05 PM      Component Value Range Status Comment   C difficile by pcr NEGATIVE  NEGATIVE  Final   STOOL CULTURE     Status: Normal (Preliminary result)   Collection Time   08/29/11  7:05 PM      Component Value Range Status Comment   Specimen Description STOOL   Final    Special Requests Normal   Final    Culture Culture  reincubated for better growth   Final    Report Status PENDING   Incomplete    Studies/Results: No results found. Medications: REVIEWED Scheduled Meds:   . adenosine (ADENOCARD) IV  6 mg Intravenous Once  . baclofen  5 mg Oral BID  . ciprofloxacin  500 mg Oral BID  . heparin  5,000 Units Subcutaneous Q8H  . pantoprazole  40 mg Oral Q1200  . sodium chloride  1,000 mL Intravenous Once   Continuous Infusions:   . sodium chloride 150 mL/hr at 08/31/11 0600   PRN Meds:.albuterol, cyclobenzaprine, HYDROcodone-acetaminophen, morphine injection, zolpidem Assessment/Plan: Principal Problem:  1.Salmonella enteritis: IMPROVING , currently on oral ciprofloxacin and IV fluids running at 131ml/hr. No more diarrhea since yesterday.  Repeat stool cultures have been negative so far. C diff PCR negative.    2.Muscle spasm of back: currently on baclofen and flexeril. Not improving, will get a CK level and check MRI  Of the lumbar spine. Increased baclofen to 5 mg TID and flexeril to 10mg  PRN.     3.ARF (acute renal failure) resolved.    4.Neck pain, acute : with radiculopathy on neck collar. Will get an MRI of the cervical spine.  5.Arthritis of hand: no signs of compartment syndrome, good pulses bilaterally. probably  REACTIVE arthritis, in view of her salmonella enteritis. But looking at old records, she has a h/o of Rheumatoid Arthritis, on further questioning, pt is not sure about it,. Auto immune work up is ordered. HLA B 27 ordered.  Will get Infectious disease consult for further evaluation and management.    6.Supraventricular tachycardia: resolved. Electrolytes will be repleted as needed.  2D echo is ordered. Serial cardiac enzymes ordered. Elevated troponin on first set of enzymes. EKG shows sinus rhythm. Will start the patient on iv heparin. Cardiology consult called from [facility] cardiology.  7. Transfer pt to telemetry when bed available.  D/w with the patient and her husband.    LOS: 3  days   Masyn Rostro 08/31/2011, 8:23 AM

## 2011-08-31 NOTE — Consult Note (Addendum)
Date of Admission:  08/28/2011  Date of Consult:  08/31/2011  Reason for Consult: salmonella enteritidis  Referring Physician: Dr. Nino [name] [name] is an 77 y.o. female pmh of OA of back and and hands with titanium rod in right first finger was admitted from her pcp's office 11/12 because of 2-3 weeks if non bloody diarrhea. She was perfectly fine 08/14/11 when she had some home chef cooked meal. 4 out of the 10 people who had that meal started having diarrhea next morning. The other three, including her husband got better in few days but she continued to have 5-7 episodes of non bloody diarrhea. She went to her pcp and gave stool samples. She was taking imodium which was not helping. She has associated back pain and leg weakness which was getting progressively worse. She went back to her pcp after a week for worsening diarrhea, generalized muscle spasms, neck and back pain. The stool cultures showed salmonella (no species reported) sensitive to ciprofloxacin, bactrim and ampicillin. She was was also told that she was in renal failure but I do not see the creatinine in office records from pcp. She was admitted to [facility] for further management.    Of note, she went into SVT with hypotension(sys 60) and HR in 200s yesterday and was transferred to icu. She spiked a troponin to 0.65 and was started on full dose of heparin. She was transferred out to icu after she spontaneuously converted back to NSR and has been having stable vitals.   Since being admitted, she has been aggressively hydrated and continued on ciprofloxacin. Her diarrhea got better today. She is having worsening back and neck pain and has new swelling in her hands b/l that is very painful. She had some since past 2-3 days but has got worse. She is also having weakness in her leg and says she is unable to walk.   Past Medical History  Diagnosis Date  . GERD (gastroesophageal reflux disease)   . Rotator cuff tear, left   . Arthritis    "osteoarthritis"  . Shortness of breath 08/28/11    "hard time breathing deeply because of the pain"  . Diarrhea 08/28/11    "for the last 17 days; from samonella poisoning"  . Headache 08/28/11    "just for the last few days; releated to dehydration"    Past Surgical History  Procedure Date  . Back surgery ~ 2004    "stenosis"  . Facial cosmetic surgery ~ 2002  . Finger surgery ~ 2000    "titanium rod in right pointer; from arthritis"  . Tonsillectomy 1943  . Abdominal hysterectomy     "still have my ovaries"  . Tubal ligation 1970's  . Cataract extraction w/ intraocular lens  implant, bilateral Summer 2011  ergies:   No Known Allergies  Medications:  cipro- day 4  Social History:  reports that she has quit smoking. Her smoking use included Cigarettes. She has a 45 pack-year smoking history. She has never used smokeless tobacco. She reports that she drinks about 8.4 ounces of alcohol per week. She reports that she does not use illicit drugs.  FH- colon cancer in father at 34, HTN in mother.   ROS Constitutional: Denies fever, chills, diaphoresis, has appetite change and fatigue.  HEENT: Denies photophobia, eye pain, redness, hearing loss, ear pain, congestion, sore throat, rhinorrhea, sneezing, mouth sores, trouble swallowing, neck pain, neck stiffness and tinnitus.   Respiratory: Denies SOB, DOE, cough, chest tightness,  and wheezing.  Cardiovascular: Denies chest pain, palpitations and leg swelling.  Gastrointestinal: denies nausea, vomiting, has diffuse abdominal soreness, no more diarrhea, no constipation, blood in stool and abdominal distention.  Genitourinary: Denies dysuria, urgency, frequency, hematuria, flank pain and difficulty urinating.  Musculoskeletal: HAs myalgias, back pain, joint swelling, arthralgias and gait problem.  Skin: Denies pallor, rash and wound.  Neurological: Denies dizziness, seizures, syncope, weakness, light-headedness, numbness and  headaches.  Hematological: Denies adenopathy. Easy bruising, personal or family bleeding history  Psychiatric/Behavioral: Denies suicidal ideation, mood changes, confusion, nervousness, sleep disturbance and agitation   Blood pressure 104/47, pulse 87, temperature 98.6 F (37 C), temperature source Oral, resp. rate 23, height 5\' 3"  (1.6 m), weight 109 lb 9.1 oz (49.7 kg), SpO2 92.00%.  BP 104/47  Pulse 87  Temp(Src) 98.6 F (37 C) (Oral)  Resp 23  Ht 5\' 3"  (1.6 m)  Wt 109 lb 9.1 oz (49.7 kg)  BMI 19.41 kg/m2  SpO2 92%  General Appearance:    Alert, cooperative, distressed from muscle cramps.   Head:    Normocephalic, without obvious abnormality, atraumatic  Neck:   Supple, symmetrical, trachea midline, no adenopathy;    thyroid:  no enlargement/tenderness/nodules; no carotid   bruit or JVD  Back:   Tender in cervical and lumbar are, mild swelling in lumbar-sacral region  Lungs:     Clear to auscultation bilaterally, respirations unlabored  Chest Wall:    No tenderness or deformity   Heart:    Regular rate and rhythm, S1 and S2 normal, no murmur, rub   or gallop  Abdomen:     Soft, mildly distended,normal bowel sounds, sore to dep palpation without rigidity or gaurding, no masses, no organomegaly  Extremities:   B/l swelling and pain in both upper extremity, lower extremities norma  Pulses:   2+ and symmetric all extremities  Skin:   Skin color, texture, turgor normal, no rashes or lesions  Lymph nodes:   Cervical, supraclavicular, and axillary nodes normal  Neurologic:   CNII-XII intact, normal strength, sensation and reflexes    throughout     Results for orders placed during the hospital encounter of 08/28/11 (from the past 48 hour(s))  CLOSTRIDIUM DIFFICILE BY PCR     Status: Normal   Collection Time   08/29/11  7:05 PM      Component Value Range Comment   C difficile by pcr NEGATIVE  NEGATIVE    STOOL CULTURE     Status: Normal (Preliminary result)   Collection Time     08/29/11  7:05 PM      Component Value Range Comment   Specimen Description STOOL      Special Requests Normal      Culture NO SUSPICIOUS COLONIES, CONTINUING TO HOLD      Report Status PENDING     GIARDIA/CRYPTOSPORIDIUM SCREEN(EIA)     Status: Normal   Collection Time   08/29/11  7:18 PM      Component Value Range Comment   Giardia Screen - EIA NEGATIVE      Cryptosporidium Screen (EIA) NEGATIVE      Specimen Source-GICRSC STOOL     POTASSIUM     Status: Normal   Collection Time   08/29/11  8:45 PM      Component Value Range Comment   Potassium 4.2  3.5 - 5.1 (mEq/L)   MAGNESIUM     Status: Normal   Collection Time   08/30/11  6:21 AM      Component Value  Range Comment   Magnesium 1.8  1.5 - 2.5 (mg/dL)   BASIC METABOLIC PANEL     Status: Abnormal   Collection Time   08/30/11  6:21 AM      Component Value Range Comment   Sodium 136  135 - 145 (mEq/L)    Potassium 4.0  3.5 - 5.1 (mEq/L)    Chloride 106  96 - 112 (mEq/L)    CO2 24  19 - 32 (mEq/L)    Glucose, Bld 95  70 - 99 (mg/dL)    BUN 15  6 - 23 (mg/dL)    Creatinine, Ser 4.09  0.50 - 1.10 (mg/dL)    Calcium 8.8  8.4 - 10.5 (mg/dL)    GFR calc non Af Amer 63 (*) >90 (mL/min)    GFR calc Af Amer 73 (*) >90 (mL/min)   GLUCOSE, CAPILLARY     Status: Abnormal   Collection Time   08/30/11 10:58 PM      Component Value Range Comment   Glucose-Capillary 133 (*) 70 - 99 (mg/dL)   COMPREHENSIVE METABOLIC PANEL     Status: Abnormal   Collection Time   08/31/11 12:58 AM      Component Value Range Comment   Sodium 136  135 - 145 (mEq/L)    Potassium 4.1  3.5 - 5.1 (mEq/L)    Chloride 104  96 - 112 (mEq/L)    CO2 23  19 - 32 (mEq/L)    Glucose, Bld 111 (*) 70 - 99 (mg/dL)    BUN 12  6 - 23 (mg/dL)    Creatinine, Ser 8.11  0.50 - 1.10 (mg/dL)    Calcium 8.3 (*) 8.4 - 10.5 (mg/dL)    Total Protein 5.2 (*) 6.0 - 8.3 (g/dL)    Albumin 1.6 (*) 3.5 - 5.2 (g/dL)    AST 41 (*) 0 - 37 (U/L)    ALT 23  0 - 35 (U/L)     Alkaline Phosphatase 151 (*) 39 - 117 (U/L)    Total Bilirubin 0.3  0.3 - 1.2 (mg/dL)    GFR calc non Af Amer 62 (*) >90 (mL/min)    GFR calc Af Amer 72 (*) >90 (mL/min)   MAGNESIUM     Status: Normal   Collection Time   08/31/11 12:58 AM      Component Value Range Comment   Magnesium 1.6  1.5 - 2.5 (mg/dL)   PHOSPHORUS     Status: Normal   Collection Time   08/31/11 12:58 AM      Component Value Range Comment   Phosphorus 3.7  2.3 - 4.6 (mg/dL)   LACTATE DEHYDROGENASE     Status: Normal   Collection Time   08/31/11 12:58 AM      Component Value Range Comment   LD 143  94 - 250 (U/L)   CBC     Status: Abnormal   Collection Time   08/31/11 12:58 AM      Component Value Range Comment   WBC 9.5  4.0 - 10.5 (K/uL)    RBC 3.16 (*) 3.87 - 5.11 (MIL/uL)    Hemoglobin 10.2 (*) 12.0 - 15.0 (g/dL)    HCT 91.4 (*) 78.2 - 46.0 (%)    MCV 93.7  78.0 - 100.0 (fL)    MCH 32.3  26.0 - 34.0 (pg)    MCHC 34.5  30.0 - 36.0 (g/dL)    RDW 95.6  21.3 - 08.6 (%)    Platelets 641 (*)  150 - 400 (K/uL)   T4, FREE     Status: Normal   Collection Time   08/31/11 12:58 AM      Component Value Range Comment   Free T4 1.09  0.80 - 1.80 (ng/dL)   TSH     Status: Normal   Collection Time   08/31/11 12:58 AM      Component Value Range Comment   TSH 1.946  0.350 - 4.500 (uIU/mL)   LIPASE, BLOOD     Status: Normal   Collection Time   08/31/11 12:58 AM      Component Value Range Comment   Lipase 28  11 - 59 (U/L)   RHEUMATOID FACTOR     Status: Normal   Collection Time   08/31/11 12:58 AM      Component Value Range Comment   Rheumatoid Factor 13  <=14 (IU/mL)   PRO B NATRIURETIC PEPTIDE     Status: Abnormal   Collection Time   08/31/11 12:59 AM      Component Value Range Comment   BNP, POC 1203.0 (*) 0 - 450 (pg/mL)   LACTIC ACID, PLASMA     Status: Normal   Collection Time   08/31/11  1:12 AM      Component Value Range Comment   Lactic Acid, Venous 0.8  0.5 - 2.2 (mmol/L)   PROCALCITONIN      Status: Normal   Collection Time   08/31/11  1:13 AM      Component Value Range Comment   Procalcitonin 0.16     CARDIAC PANEL(CRET KIN+CKTOT+MB+TROPI)     Status: Abnormal   Collection Time   08/31/11  1:13 AM      Component Value Range Comment   Total CK 31  7 - 177 (U/L)    CK, MB 2.8  0.3 - 4.0 (ng/mL)    Troponin I 0.61 (*) <0.30 (ng/mL)    Relative Index RELATIVE INDEX IS INVALID  0.0 - 2.5    GLUCOSE, CAPILLARY     Status: Abnormal   Collection Time   08/31/11  4:56 AM      Component Value Range Comment   Glucose-Capillary 125 (*) 70 - 99 (mg/dL)    Comment 1 Documented in Chart      Comment 2 Notify RN     CARDIAC PANEL(CRET KIN+CKTOT+MB+TROPI)     Status: Abnormal   Collection Time   08/31/11  1:00 PM      Component Value Range Comment   Total CK 83  7 - 177 (U/L)    CK, MB 4.4 (*) 0.3 - 4.0 (ng/mL)    Troponin I 0.60 (*) <0.30 (ng/mL)    Relative Index RELATIVE INDEX IS INVALID  0.0 - 2.5        Component Value Date/Time   SDES STOOL 08/29/2011 1905   SPECREQUEST Normal 08/29/2011 1905   CULT NO SUSPICIOUS COLONIES, CONTINUING TO HOLD 08/29/2011 1905   REPTSTATUS PENDING 08/29/2011 1905   Mr Cervical Spine Wo Contrast  08/31/2011  *RADIOLOGY REPORT*  Clinical Data:  Back pain and spasms.  Neck pain  MRI CERVICAL AND LUMBAR SPINE WITHOUT CONTRAST  Technique:  Multiplanar and multiecho pulse sequences of the cervical spine, to include the craniocervical junction and cervicothoracic junction, and lumbar spine, were obtained without intravenous contrast.  Comparison:  Lumbar MRI 08/01/2004  MRI CERVICAL SPINE  Findings:  Normal cervical alignment.  Negative for fracture or mass.  Cervical cord has normal signal and morphology.  Negative for Chiari malformation  C2-3:  Mild disc degeneration.  C3-4:  Disc degeneration and mild spurring.  Mild foraminal narrowing on the right and moderate foraminal narrowing on the left.  Mild central canal stenosis.  C4-5:  Disc  degeneration and moderate spondylosis causing mild spinal stenosis and moderate foraminal narrowing bilaterally. There is mild flattening of the cord due to spurring  C5-6:  Moderate spondylosis with flattening of the cord and mild to moderate spinal stenosis.  Moderate foraminal narrowing bilaterally due to spurring.  C6-7:  Mild to moderate spondylosis.  Right foraminal narrowing due to spurring.  C7-T1:  Negative  IMPRESSION: Cervical disc degeneration and spondylosis as above.  No acute disc protrusion.  MRI LUMBAR SPINE  Findings: 9 mm anterior slip L4 on L5 has progressed since the prior study.  No fracture is identified.  No mass lesion.  Conus medullaris is normal and terminates at L1-2.  T12-L1:  Negative  L1-2:  Small right-sided disc protrusion with disc bulging.  Mild right foraminal narrowing.  L2-3:  Diffuse disc bulging with mild spurring.  Mild facet degeneration and mild spinal stenosis.  Mild left foraminal narrowing.  L3-4:  Disc degeneration with diffuse disc bulging.  Moderate facet degeneration with mild to moderate spinal stenosis.  L4-5:  9 mm anterior slip.  This is associated  with severe facet degeneration and disc degeneration.  Interval decompressive laminectomy since the prior MRI in 2005.  There is lateral recess encroachment bilaterally.  Mild central canal stenosis.  L5-S1:  Disc degeneration and spondylosis.  Bilateral facet hypertrophy with moderate foraminal narrowing bilaterally.  1 cm synovial cyst on the left projects posterior to the L5-S1 facet joint.  This was not present previously.  IMPRESSION: Mild spinal stenosis at L2-3 and mild to moderate stenosis at L2-3 due to disc and facet degeneration.  9 mm of slip of L4-L5 has progressed since 2005.  There has been interval posterior decompression at  this level. There is mild spinal stenosis however there is significant lateral recess encroachment especially on the right.  Foraminal encroachment bilaterally L5-S1 due to  spondylosis and facet degeneration,  unchanged.  Original Report Authenticated By: Camelia Phenes, M.D.   Mr Lumbar Spine Wo Contrast  08/31/2011  *RADIOLOGY REPORT*  Clinical Data:  Back pain and spasms.  Neck pain  MRI CERVICAL AND LUMBAR SPINE WITHOUT CONTRAST  Technique:  Multiplanar and multiecho pulse sequences of the cervical spine, to include the craniocervical junction and cervicothoracic junction, and lumbar spine, were obtained without intravenous contrast.  Comparison:  Lumbar MRI 08/01/2004  MRI CERVICAL SPINE  Findings:  Normal cervical alignment.  Negative for fracture or mass.  Cervical cord has normal signal and morphology.  Negative for Chiari malformation  C2-3:  Mild disc degeneration.  C3-4:  Disc degeneration and mild spurring.  Mild foraminal narrowing on the right and moderate foraminal narrowing on the left.  Mild central canal stenosis.  C4-5:  Disc degeneration and moderate spondylosis causing mild spinal stenosis and moderate foraminal narrowing bilaterally. There is mild flattening of the cord due to spurring  C5-6:  Moderate spondylosis with flattening of the cord and mild to moderate spinal stenosis.  Moderate foraminal narrowing bilaterally due to spurring.  C6-7:  Mild to moderate spondylosis.  Right foraminal narrowing due to spurring.  C7-T1:  Negative  IMPRESSION: Cervical disc degeneration and spondylosis as above.  No acute disc protrusion.  MRI LUMBAR SPINE  Findings: 9 mm anterior slip L4 on L5  has progressed since the prior study.  No fracture is identified.  No mass lesion.  Conus medullaris is normal and terminates at L1-2.  T12-L1:  Negative  L1-2:  Small right-sided disc protrusion with disc bulging.  Mild right foraminal narrowing.  L2-3:  Diffuse disc bulging with mild spurring.  Mild facet degeneration and mild spinal stenosis.  Mild left foraminal narrowing.  L3-4:  Disc degeneration with diffuse disc bulging.  Moderate facet degeneration with mild to moderate  spinal stenosis.  L4-5:  9 mm anterior slip.  This is associated  with severe facet degeneration and disc degeneration.  Interval decompressive laminectomy since the prior MRI in 2005.  There is lateral recess encroachment bilaterally.  Mild central canal stenosis.  L5-S1:  Disc degeneration and spondylosis.  Bilateral facet hypertrophy with moderate foraminal narrowing bilaterally.  1 cm synovial cyst on the left projects posterior to the L5-S1 facet joint.  This was not present previously.  IMPRESSION: Mild spinal stenosis at L2-3 and mild to moderate stenosis at L2-3 due to disc and facet degeneration.  9 mm of slip of L4-L5 has progressed since 2005.  There has been interval posterior decompression at  this level. There is mild spinal stenosis however there is significant lateral recess encroachment especially on the right.  Foraminal encroachment bilaterally L5-S1 due to spondylosis and facet degeneration,  unchanged.  Original Report Authenticated By: Camelia Phenes, M.D.    Impression/Recommendation  1. Salmonella gastro-enteritits: Patient's diarrhea seems to be improving with antibiotics. This is probably non typhoid strain of salmonella. Will change ciprofloxacin to rocephin (salmonella is sensitive to ampicillin, hence most likely sensitive to cefalosporins) because floroquinolones can cause mental status changes in elderly and place her at risk for c.difficile infection.  she will need only 1-2 days as she has recovered from her initial insult from gastroenteritis. Hand swelling does not look like osteomyelitis as it is bilateral and seems to be involving soft tissue but plain films might help to see the extent of OA in the hands as she is having sever pain.    Thank you so much for this consult,   [facility] 08/31/2011, 4:19 PM    I have seen patient, discussed results and reviewed the above note by [name]. I agree with the above assessment and plan.  [name] Infectious  Diseases [phone] 08/31/2011, 10:07 PM

## 2011-08-31 NOTE — Progress Notes (Signed)
Patient has been stable in the intensive care unit was normal blood pressure and heart rate since my last evaluation. She has been in sinus rhythm. The lab values reviewed her lactic acid and are normal but her troponin is elevated at 0.61. Her magnesium was low at 1.6. I have discussed this with Dr. hospitalist Dr. Blake Divine. I will replacement magnesium and she will call cardiology in followup on the echocardiogram report  We discussed patient's neck pain and arthropathy. She will consider getting an MRI. We will wait on autoimmune profile.   We agreed that patient is stable for transfer to telemetry floor  Pulmonary critical care we'll sign off at this point

## 2011-08-31 NOTE — Consult Note (Addendum)
Name: [name] MRN: [id number] DOB: 1933-02-01  DOS:   08/31/11     CRITICAL CARE MEDICINE ADMISSION / CONSULTATION NOTE  Referring Physician  Dr Peggye Pitt  Reason For Consult  Supraventricular Tachycardia Summary:  Patient Description  77 year old female with sore murmur or diarrhea admitted 08/28/2011. Transfer to intensive care unit 08/30/2011 with first episode of supraventricular tachycardia with associated hypotension that spontaneously converted to sinus rhythm and regained normal blood pressure    Line Location Start Stop  None       Culture Date Result       Antibiotic Indication Start Stop  Cipro   salmonella bacteria and diarrhea      GI Prophylaxis DVT Prophylaxis      Protocols     Consultants       Date Imaging Studies  08/28/2011   C-spine x-ray showing degenerative changes    Date Events  11/14 2012   supraventricular tachycardia, arthropathy of the hand and neck       History Of Present Illness     77 year old lady who is an ex-smoker and former real estate agent current, housewife. She tells s me that she is basically fit and active without much medical problems. Some 3 weeks ago she posterior a dinner at her home with a professional chef. Of the 10 people who consumed a dinner 4 of them developed diarrhea including her husband. Apparently this has been confirmed as Salmonella related diarrhea. Her husband symptoms resolved but she's had ongoing continued diarrhea. At some point her stool was tested positive for Salmonella and she was admitted on 08/18/2011. Some 5 days before the admission she noticed new onset constant moderate intensity, neck pain at the back of the neck with radiation down to the lower chest and upper abdomen and the back. She describes this as spasms of pain although it is constant. She is wearing a soft neck collar with very little relief. Since admission she has had an x-ray that shows DJD changes. Since admission she  is being treated with ciprofloxacin but her diarrhea has continued although it might be improving. In addition she was found to have dehydration and acute renal failure versus prerenal failure this is resolved with fluid hydration. In the today since admission she has developed bilateral hand swelling right worse than the left in the hands exquisitely tender to touch particularly the right hand in the palmar aspect she denies any IV extravasation of fluid. Then, on 08/29/2001 her weight she developed first episode of supraventricular tachycardia with hypotension and was rushed to the intensive care unit but either en route or shortness to arrival she spontaneously converted to sinus rhythm and regained normal blood pressure. At this point she feels relatively stable but symptomatic with a pre-supraventricular tachycardia complaints.  Of note her pain is being treated with morphine and oxycodone in the current setting of normal  renal function  She is frustrated by her ongoing symptomatology.  Past Medical History  Diagnosis Date  . GERD (gastroesophageal reflux disease)   . Rotator cuff tear, left   . Arthritis     "osteoarthritis"  . Shortness of breath 08/28/11    "hard time breathing deeply because of the pain"  . Diarrhea 08/28/11    "for the last 17 days; from samonella poisoning"  . Headache 08/28/11    "just for the last few days; releated to dehydration"    Past Surgical History  Procedure Date  . Back surgery ~ 2004    "  stenosis"  . Facial cosmetic surgery ~ 2002  . Finger surgery ~ 2000    "titanium rod in right pointer; from arthritis"  . Tonsillectomy 1943  . Abdominal hysterectomy     "still have my ovaries"  . Tubal ligation 1970's  . Cataract extraction w/ intraocular lens  implant, bilateral Summer 2011    Prior to Admission medications   Medication Sig Start Date End Date Taking? Authorizing Provider  ACAI PO Take 1 tablet by mouth daily.     Yes Historical  Provider, MD  Ascorbic Acid (VITAMIN C) 1000 MG tablet Take 1,000 mg by mouth daily.     Yes Historical Provider, MD  BIOTIN PO Take 1 tablet by mouth daily.     Yes Historical Provider, MD  calcium citrate-vitamin D 200-200 MG-UNIT TABS Take 1 tablet by mouth daily.     Yes Historical Provider, MD  Coenzyme Q10 (CO Q 10 PO) Take 1 tablet by mouth daily.     Yes Historical Provider, MD  estradiol (VIVELLE-DOT) 0.0375 MG/24HR Place 1 patch onto the skin 2 (two) times a week. Patient changes patch on Mondays and Fridays    Yes Historical Provider, MD  fish oil-omega-3 fatty acids 1000 MG capsule Take 2 g by mouth daily.     Yes Historical Provider, MD  Multiple Vitamins-Minerals (MULTIVITAMINS THER. W/MINERALS) TABS Take 1 tablet by mouth daily.     Yes Historical Provider, MD  omeprazole (PRILOSEC) 40 MG capsule Take 40 mg by mouth daily.     Yes Historical Provider, MD  OVER THE COUNTER MEDICATION Take 2 tablets by mouth daily. Osteo biflex    Yes Historical Provider, MD    No Known Allergies  History reviewed. No pertinent family history.  Social History  reports that she has quit smoking. Her smoking use included Cigarettes. She has a 45 pack-year smoking history. She has never used smokeless tobacco. She reports that she drinks about 8.4 ounces of alcohol per week. She reports that she does not use illicit drugs.  Review Of Systems  11 points review of systems is negative with an exception of listed in HPI.  Physical Examination  BP 101/58  Pulse 91  Temp(Src) 98.6 F (37 C) (Oral)  Resp 22  Ht 5\' 3"  (1.6 m)  Wt 49.7 kg (109 lb 9.1 oz)  BMI 19.41 kg/m2  SpO2 98%  Neuro:  Sedated, intubated HEENT:  ETT/OGT Heart:  RRR, no M/R/G Lungs:  Bilateral air entry, no W/R/R Abdomen:  Soft, non tender, bowel sounds present Extremities:  Bowel sounds present  Labs  Results for orders placed during the hospital encounter of 08/28/11 (from the past 24 hour(s))  MAGNESIUM     Status:  Normal   Collection Time   08/30/11  6:21 AM      Component Value Range   Magnesium 1.8  1.5 - 2.5 (mg/dL)  BASIC METABOLIC PANEL     Status: Abnormal   Collection Time   08/30/11  6:21 AM      Component Value Range   Sodium 136  135 - 145 (mEq/L)   Potassium 4.0  3.5 - 5.1 (mEq/L)   Chloride 106  96 - 112 (mEq/L)   CO2 24  19 - 32 (mEq/L)   Glucose, Bld 95  70 - 99 (mg/dL)   BUN 15  6 - 23 (mg/dL)   Creatinine, Ser 1.61  0.50 - 1.10 (mg/dL)   Calcium 8.8  8.4 - 09.6 (mg/dL)   GFR  calc non Af Amer 63 (*) >90 (mL/min)   GFR calc Af Amer 73 (*) >90 (mL/min)     Imaging C-spine x-rays 08/28/2011 as enumerated above  Stool C. difficile 11/13 2012 negative     Assessment Patient Active Hospital Problem List: Salmonella enteritis (08/28/2011)   Assessment: The admission problem. [facility] with ciprofloxacin. Unclear if her diarrhea has improved but I suspect it is    Plan: Per primary team [facility]  Neck pain, acute (08/31/2011)   Assessment: Although x-ray shows DJD her neck pain is recent and acute and is followed the onset of diarrhea so I wonder if this is Salmonella reactive arthritis. Currently no neurologic deficit.    Plan: Again per primary team but on the morning of 08/31/2011 Will discuss with them about the utility of getting MRI of the neck  Arthritis of hand (08/31/2011)   Assessment: This is new onset since admission to the hospital. Her palms exquisitely tender to touch especially on the right side. I'm not sure what the etiology is there's no evidence of compartment syndrome. Again suspect some of the lower reactive arthritis    Plan: We'll check autoimmune antibodies and HLA-B 27 . The latter can denote salmonella reactive arthiris. Will also check PCT and lactate Supraventricular tachycardia (08/31/2011)   Assessment: This is new onset and was associated with hypotension and developed in the hospital since admission. Unclear  etiology. Fortunately resolved by itself    Plan: Keep in intensive care unit, was carefully monitor, check thyroid function, check autoimmune profile and echocardiogram and get cardiac enzymes Muscle spasm of back (08/28/2011)   Assessment: This appears related to neck pain    Plan: Per [facility]. I will talk to [facility] about getting MRI of the spine  Dehydration (08/28/2011)   Assessment: This appears resolved    Plan: Monitor  ARF (acute renal failure) (08/28/2011)   Assessment: This was related to diarrhea and dehydration and appears to have resolved    Plan: Monitor. If renal function worsens would discontinue morphine and oxycodone which are dangerous in renal failure  Case was discussed with Dr. Penne Lash and  [facility] will continued the primary team     [facility], MD 08/31/2011, 12:39 AM

## 2011-08-31 NOTE — Progress Notes (Signed)
Utilization Review Completed.[name] T11/15/2012   

## 2011-08-31 NOTE — Progress Notes (Signed)
Shift Note: Pt HR sustained at 200-204. BP 76/62. 30R, 92% RA. Pt c/o difficulty "getting a deep breath". Placed pt on 2L oxygen. Notified MD of findings. Orders received to give patient 1 liter bolus NS, then NS @ 136ml/hr, EKG. Read findings of EKG and new BP, HR to MD (SVT, 76/64, 190P). Orders received to transfer to step-down for frequent monitoring. Prior to transfer her blood pressure 94/60, 101P. Notified MD and orders received to repeat EKG (NSR with atrial arrythmias). Pt transferred to 2116. Tacey Heap RN

## 2011-08-31 NOTE — Consults (Signed)
Admit date: 08/28/2011 Referring Physician:  [facility] hospital[facility] Primary Physician:  Blair Heys Primary Cardiologist: Verdis Prime Reason for Consultation    ASSESSMENT: 1. Paroxysmal supraventricular tachycardia, resolved spontaneously.  2. Elevated troponin, likely secondary to demand ischemia (type 2  non-ST elevation myocardial infarction).   PLAN: 1. 2-D echocardiogram to assess cardiac structure  2. Cycle cardiac markers.  3. Repeat electrocardiogram in the a.m.   4. No specific therapy at this time unless recurrent arrhythmia.   HPI: The patient is a pleasant 77 year old who was admitted to the hospital with Salmonella enteritis. Last evening as she was improving she suddenly developed in our complex tachycardia with heart rates near 200 beats per minute. Hypotension develop. A subsequent troponin level was mildly elevated at 0.61. She experienced no chest pain but did feel palpitations. She has never before experienced this.   PMH:   Past Medical History  Diagnosis Date  . GERD (gastroesophageal reflux disease)   . Rotator cuff tear, left   . Arthritis     "osteoarthritis"  . Shortness of breath 08/28/11    "hard time breathing deeply because of the pain"  . Diarrhea 08/28/11    "for the last 17 days; from samonella poisoning"  . Headache 08/28/11    "just for the last few days; releated to dehydration"     PSH:   Past Surgical History  Procedure Date  . Back surgery ~ 2004    "stenosis"  . Facial cosmetic surgery ~ 2002  . Finger surgery ~ 2000    "titanium rod in right pointer; from arthritis"  . Tonsillectomy 1943  . Abdominal hysterectomy     "still have my ovaries"  . Tubal ligation 1970's  . Cataract extraction w/ intraocular lens  implant, bilateral Summer 2011    Allergies:  Review of patient's allergies indicates no known allergies. Fam HX:   History reviewed. No pertinent family history. Social HX:    History   Social History  . Marital  Status: Married    Spouse Name: N/A    Number of Children: N/A  . Years of Education: N/A   Occupational History  . Not on file.   Social History Main Topics  . Smoking status: Former Smoker -- 1.5 packs/day for 30 years    Types: Cigarettes  . Smokeless tobacco: Never Used  . Alcohol Use: 8.4 oz/week    10 Glasses of wine, 4 Shots of liquor per week     "usually wine; sosmetimes vodka; couple drinks q night"  . Drug Use: No     "quit smoking cigarettes ~ 1980's"  . Sexually Active: Not on file   Other Topics Concern  . Not on file   Social History Narrative  . No narrative on file     Review of Systems: No pertinent positives on system review other than GI where the patient presented with abdominal discomfort and diarrhea.  Physical Exam: Blood pressure 104/47, pulse 87, temperature 98.6 F (37 C), temperature source Oral, resp. rate 23, height 5\' 3"  (1.6 m), weight 49.7 kg (109 lb 9.1 oz), SpO2 92.00%. Weight change: 0.167 kg (5.9 oz)  The patient appears pale. She is sitting at 90 in her bed. Her husband is eating her lunch. She has a neck collar on. She is in no distress her lungs are clear the cardiac exam reveals a 1/6 systolic murmur left midsternal border. Abdomen is mildly tender the extremities reveal no edema neurological exam is grossly intact. Patient is able  to appropriately answer questions and is alert and oriented x3. Labs:   Lab Results  Component Value Date   WBC 9.5 08/31/2011   HGB 10.2* 08/31/2011   HCT 29.6* 08/31/2011   MCV 93.7 08/31/2011   PLT 641* 08/31/2011    Lab 08/31/11 0058  NA 136  K 4.1  CL 104  CO2 23  BUN 12  CREATININE 0.88  CALCIUM 8.3*  PROT 5.2*  BILITOT 0.3  ALKPHOS 151*  ALT 23  AST 41*  GLUCOSE 111*   No results found for this basename: PTT   Lab Results  Component Value Date   INR 1.25 08/29/2011   Lab Results  Component Value Date   CKTOTAL 83 08/31/2011   CKMB 4.4* 08/31/2011   TROPONINI 0.60*  08/31/2011         Radiology:  no chest x-ray report is available  EKG:  EKG during tachycardia was reviewed and is compatible with paroxysmal atrial tachycardia/PSVT. EKG post conversion is normal.    [name], [name] 08/31/2011 3:59 PM

## 2011-08-31 NOTE — Progress Notes (Signed)
CRITICAL VALUE ALERT  Critical value received:  Troponin = 0.61    Date of notification:  08/31/11  Time of notification:  0240   Critical value read back:yes  Nurse who received alert:  Val Farnam, Diona Fanti  MD notified (1st page):  Dr. Darrick Penna, MD  Time of first page:  0245  MD notified (2nd page):  Time of second page:  Responding MD:    Time MD responded:

## 2011-09-01 ENCOUNTER — Inpatient Hospital Stay (HOSPITAL_COMMUNITY): Payer: Medicare Other

## 2011-09-01 LAB — URINALYSIS, ROUTINE W REFLEX MICROSCOPIC
Hgb urine dipstick: NEGATIVE
Nitrite: NEGATIVE
Specific Gravity, Urine: 1.008 (ref 1.005–1.030)
Urobilinogen, UA: 0.2 mg/dL (ref 0.0–1.0)

## 2011-09-01 LAB — COMPREHENSIVE METABOLIC PANEL
AST: 19 U/L (ref 0–37)
Albumin: 1.4 g/dL — ABNORMAL LOW (ref 3.5–5.2)
Alkaline Phosphatase: 137 U/L — ABNORMAL HIGH (ref 39–117)
BUN: 7 mg/dL (ref 6–23)
Chloride: 105 mEq/L (ref 96–112)
Potassium: 3.7 mEq/L (ref 3.5–5.1)
Total Bilirubin: 0.2 mg/dL — ABNORMAL LOW (ref 0.3–1.2)
Total Protein: 5.2 g/dL — ABNORMAL LOW (ref 6.0–8.3)

## 2011-09-01 LAB — CBC
HCT: 30.5 % — ABNORMAL LOW (ref 36.0–46.0)
MCV: 93.8 fL (ref 78.0–100.0)
RBC: 3.25 MIL/uL — ABNORMAL LOW (ref 3.87–5.11)
RDW: 13.8 % (ref 11.5–15.5)
WBC: 14.3 10*3/uL — ABNORMAL HIGH (ref 4.0–10.5)

## 2011-09-01 LAB — URINE MICROSCOPIC-ADD ON

## 2011-09-01 LAB — HEPARIN LEVEL (UNFRACTIONATED): Heparin Unfractionated: <0.1 IU/mL — ABNORMAL LOW (ref 0.30–0.70)

## 2011-09-01 LAB — MAGNESIUM: Magnesium: 1.9 mg/dL (ref 1.5–2.5)

## 2011-09-01 LAB — CYCLIC CITRUL PEPTIDE ANTIBODY, IGG: Cyclic Citrullin Peptide Ab: 2.8 U/mL (ref 0.0–5.0)

## 2011-09-01 LAB — ANA: Anti Nuclear Antibody(ANA): NEGATIVE

## 2011-09-01 MED ORDER — METHYLPREDNISOLONE 4 MG PO KIT
8.0000 mg | PACK | Freq: Every morning | ORAL | Status: AC
  Administered 2011-09-01: 8 mg via ORAL
  Filled 2011-09-01: qty 21

## 2011-09-01 MED ORDER — HEPARIN SODIUM (PORCINE) 5000 UNIT/ML IJ SOLN
5000.0000 [IU] | Freq: Three times a day (TID) | INTRAMUSCULAR | Status: DC
  Administered 2011-09-01 – 2011-09-03 (×5): 5000 [IU] via SUBCUTANEOUS
  Filled 2011-09-01 (×8): qty 1

## 2011-09-01 MED ORDER — TRAMADOL HCL 50 MG PO TABS
50.0000 mg | ORAL_TABLET | Freq: Four times a day (QID) | ORAL | Status: DC | PRN
  Filled 2011-09-01: qty 1

## 2011-09-01 MED ORDER — METHYLPREDNISOLONE 4 MG PO KIT: 8.0000 mg | PACK | Freq: Every evening | ORAL | Status: DC

## 2011-09-01 MED ORDER — METHYLPREDNISOLONE 4 MG PO KIT: 4.0000 mg | PACK | ORAL | Status: DC

## 2011-09-01 MED ORDER — METHYLPREDNISOLONE ACETATE PF 80 MG/ML IJ SUSP
80.0000 mg | Freq: Once | INTRAMUSCULAR | Status: AC
  Administered 2011-09-01: 80 mg via INTRAMUSCULAR
  Filled 2011-09-01: qty 1

## 2011-09-01 MED ORDER — ACETAMINOPHEN 325 MG PO TABS
650.0000 mg | ORAL_TABLET | ORAL | Status: DC | PRN
  Administered 2011-09-01: 650 mg via ORAL
  Filled 2011-09-01: qty 2

## 2011-09-01 MED ORDER — METHYLPREDNISOLONE 4 MG PO KIT: 4.0000 mg | PACK | Freq: Four times a day (QID) | ORAL | Status: DC

## 2011-09-01 MED ORDER — BOOST / RESOURCE BREEZE PO LIQD: 1.0000 | Freq: Every day | ORAL | Status: DC | PRN

## 2011-09-01 MED ORDER — DEXTROSE 5 % IV SOLN: 2.0000 g | INTRAVENOUS | Status: DC

## 2011-09-01 MED ORDER — METHYLPREDNISOLONE 4 MG PO KIT: 4.0000 mg | PACK | Freq: Three times a day (TID) | ORAL | Status: DC

## 2011-09-01 MED ORDER — HEPARIN BOLUS VIA INFUSION
1500.0000 [IU] | Freq: Once | INTRAVENOUS | Status: AC
  Administered 2011-09-01: 1500 [IU] via INTRAVENOUS
  Filled 2011-09-01: qty 1500

## 2011-09-01 NOTE — Progress Notes (Addendum)
Subjective:  Persistent hand pain, and swelling. Diarrhea resolved. No bowel movement in 48 hrs.  Objective: Vital signs in last 24 hours: Filed Vitals:   09/01/11 0001 09/01/11 0500 09/01/11 0630 09/01/11 0829  BP:   109/68 111/59  Pulse:   85 93  Temp: 98.6 F (37 C)  98 F (36.7 C) 99.2 F (37.3 C)  TempSrc:    Oral  Resp:   18 17  Height:      Weight:  54.2 kg (119 lb 7.8 oz)    SpO2:   92% 90%   Weight change: 4.5 kg (9 lb 14.7 oz)  Intake/Output Summary (Last 24 hours) at 09/01/11 1206 Last data filed at 09/01/11 0830  Gross per 24 hour  Intake 708.08 ml  Output    900 ml  Net -191.92 ml   Physical Exam:   General Appearance:    Alert, cooperative, no distress, appears stated age  Lungs:     Clear to auscultation bilaterally, respirations unlabored   Heart:    Regular rate and rhythm, S1 and S2 normal,     Abdomen:     Soft, non-tender, bowel sounds active all four quadrants,    no masses, no organomegaly  Extremities:   /l  hands edematous, tender, and 5th digit's erythematous.   Pulses:   2+ and symmetric all extremities    Lab Results: Results for orders placed during the hospital encounter of 08/28/11 (from the past 24 hour(s))  CARDIAC PANEL(CRET KIN+CKTOT+MB+TROPI)     Status: Abnormal   Collection Time   08/31/11  1:00 PM      Component Value Range   Total CK 83  7 - 177 (U/L)   CK, MB 4.4 (*) 0.3 - 4.0 (ng/mL)   Troponin I 0.60 (*) <0.30 (ng/mL)   Relative Index RELATIVE INDEX IS INVALID  0.0 - 2.5   CARDIAC PANEL(CRET KIN+CKTOT+MB+TROPI)     Status: Abnormal   Collection Time   08/31/11  4:15 PM      Component Value Range   Total CK 108  7 - 177 (U/L)   CK, MB 4.0  0.3 - 4.0 (ng/mL)   Troponin I 0.51 (*) <0.30 (ng/mL)   Relative Index 3.7 (*) 0.0 - 2.5   HEPARIN LEVEL (UNFRACTIONATED)     Status: Abnormal   Collection Time   08/31/11  6:03 PM      Component Value Range   Heparin Unfractionated <0.10 (*) 0.30 - 0.70 (IU/mL)  CBC      Status: Abnormal   Collection Time   09/01/11  6:45 AM      Component Value Range   WBC 14.3 (*) 4.0 - 10.5 (K/uL)   RBC 3.25 (*) 3.87 - 5.11 (MIL/uL)   Hemoglobin 10.5 (*) 12.0 - 15.0 (g/dL)   HCT 16.1 (*) 09.6 - 46.0 (%)   MCV 93.8  78.0 - 100.0 (fL)   MCH 32.3  26.0 - 34.0 (pg)   MCHC 34.4  30.0 - 36.0 (g/dL)   RDW 04.5  40.9 - 81.1 (%)   Platelets 662 (*) 150 - 400 (K/uL)  COMPREHENSIVE METABOLIC PANEL     Status: Abnormal   Collection Time   09/01/11  6:45 AM      Component Value Range   Sodium 134 (*) 135 - 145 (mEq/L)   Potassium 3.7  3.5 - 5.1 (mEq/L)   Chloride 105  96 - 112 (mEq/L)   CO2 22  19 - 32 (mEq/L)  Glucose, Bld 84  70 - 99 (mg/dL)   BUN 7  6 - 23 (mg/dL)   Creatinine, Ser 1.61  0.50 - 1.10 (mg/dL)   Calcium 7.9 (*) 8.4 - 10.5 (mg/dL)   Total Protein 5.2 (*) 6.0 - 8.3 (g/dL)   Albumin 1.4 (*) 3.5 - 5.2 (g/dL)   AST 19  0 - 37 (U/L)   ALT 15  0 - 35 (U/L)   Alkaline Phosphatase 137 (*) 39 - 117 (U/L)   Total Bilirubin 0.2 (*) 0.3 - 1.2 (mg/dL)   GFR calc non Af Amer 80 (*) >90 (mL/min)   GFR calc Af Amer >90  >90 (mL/min)  MAGNESIUM     Status: Normal   Collection Time   09/01/11  6:45 AM      Component Value Range   Magnesium 1.9  1.5 - 2.5 (mg/dL)  HEPARIN LEVEL (UNFRACTIONATED)     Status: Abnormal   Collection Time   09/01/11  8:30 AM      Component Value Range   Heparin Unfractionated <0.10 (*) 0.30 - 0.70 (IU/mL)    Micro Results: Recent Results (from the past 240 hour(s))  CULTURE, BLOOD (ROUTINE X 2)     Status: Normal (Preliminary result)   Collection Time   08/28/11  3:17 PM      Component Value Range Status Comment   Specimen Description BLOOD LEFT ARM   Final    Special Requests BOTTLES DRAWN AEROBIC AND ANAEROBIC 10CC   Final    Setup Time [id number]   Final    Culture     Final    Value:        BLOOD CULTURE RECEIVED NO GROWTH TO DATE CULTURE WILL BE HELD FOR 5 DAYS BEFORE ISSUING A FINAL NEGATIVE REPORT   Report Status  PENDING   Incomplete   CULTURE, BLOOD (ROUTINE X 2)     Status: Normal (Preliminary result)   Collection Time   08/28/11  3:24 PM      Component Value Range Status Comment   Specimen Description BLOOD LEFT WRIST   Final    Special Requests BOTTLES DRAWN AEROBIC ONLY 7CC   Final    Setup Time [id number]   Final    Culture     Final    Value:        BLOOD CULTURE RECEIVED NO GROWTH TO DATE CULTURE WILL BE HELD FOR 5 DAYS BEFORE ISSUING A FINAL NEGATIVE REPORT   Report Status PENDING   Incomplete   CLOSTRIDIUM DIFFICILE BY PCR     Status: Normal   Collection Time   08/29/11  7:05 PM      Component Value Range Status Comment   C difficile by pcr NEGATIVE  NEGATIVE  Final   STOOL CULTURE     Status: Normal (Preliminary result)   Collection Time   08/29/11  7:05 PM      Component Value Range Status Comment   Specimen Description STOOL   Final    Special Requests Normal   Final    Culture NO SUSPICIOUS COLONIES, CONTINUING TO HOLD   Final    Report Status PENDING   Incomplete    Studies/Results: Mr Cervical Spine Wo Contrast  08/31/2011  *RADIOLOGY REPORT*  Clinical Data:  Back pain and spasms.  Neck pain  MRI CERVICAL AND LUMBAR SPINE WITHOUT CONTRAST  Technique:  Multiplanar and multiecho pulse sequences of the cervical spine, to include the craniocervical junction and cervicothoracic junction, and lumbar spine,  were obtained without intravenous contrast.  Comparison:  Lumbar MRI 08/01/2004  MRI CERVICAL SPINE  Findings:  Normal cervical alignment.  Negative for fracture or mass.  Cervical cord has normal signal and morphology.  Negative for Chiari malformation  C2-3:  Mild disc degeneration.  C3-4:  Disc degeneration and mild spurring.  Mild foraminal narrowing on the right and moderate foraminal narrowing on the left.  Mild central canal stenosis.  C4-5:  Disc degeneration and moderate spondylosis causing mild spinal stenosis and moderate foraminal narrowing bilaterally. There is mild  flattening of the cord due to spurring  C5-6:  Moderate spondylosis with flattening of the cord and mild to moderate spinal stenosis.  Moderate foraminal narrowing bilaterally due to spurring.  C6-7:  Mild to moderate spondylosis.  Right foraminal narrowing due to spurring.  C7-T1:  Negative  IMPRESSION: Cervical disc degeneration and spondylosis as above.  No acute disc protrusion.  MRI LUMBAR SPINE  Findings: 9 mm anterior slip L4 on L5 has progressed since the prior study.  No fracture is identified.  No mass lesion.  Conus medullaris is normal and terminates at L1-2.  T12-L1:  Negative  L1-2:  Small right-sided disc protrusion with disc bulging.  Mild right foraminal narrowing.  L2-3:  Diffuse disc bulging with mild spurring.  Mild facet degeneration and mild spinal stenosis.  Mild left foraminal narrowing.  L3-4:  Disc degeneration with diffuse disc bulging.  Moderate facet degeneration with mild to moderate spinal stenosis.  L4-5:  9 mm anterior slip.  This is associated  with severe facet degeneration and disc degeneration.  Interval decompressive laminectomy since the prior MRI in 2005.  There is lateral recess encroachment bilaterally.  Mild central canal stenosis.  L5-S1:  Disc degeneration and spondylosis.  Bilateral facet hypertrophy with moderate foraminal narrowing bilaterally.  1 cm synovial cyst on the left projects posterior to the L5-S1 facet joint.  This was not present previously.  IMPRESSION: Mild spinal stenosis at L2-3 and mild to moderate stenosis at L2-3 due to disc and facet degeneration.  9 mm of slip of L4-L5 has progressed since 2005.  There has been interval posterior decompression at  this level. There is mild spinal stenosis however there is significant lateral recess encroachment especially on the right.  Foraminal encroachment bilaterally L5-S1 due to spondylosis and facet degeneration,  unchanged.  Original Report Authenticated By: Camelia Phenes, M.D.   Mr Lumbar Spine Wo  Contrast  08/31/2011  *RADIOLOGY REPORT*  Clinical Data:  Back pain and spasms.  Neck pain  MRI CERVICAL AND LUMBAR SPINE WITHOUT CONTRAST  Technique:  Multiplanar and multiecho pulse sequences of the cervical spine, to include the craniocervical junction and cervicothoracic junction, and lumbar spine, were obtained without intravenous contrast.  Comparison:  Lumbar MRI 08/01/2004  MRI CERVICAL SPINE  Findings:  Normal cervical alignment.  Negative for fracture or mass.  Cervical cord has normal signal and morphology.  Negative for Chiari malformation  C2-3:  Mild disc degeneration.  C3-4:  Disc degeneration and mild spurring.  Mild foraminal narrowing on the right and moderate foraminal narrowing on the left.  Mild central canal stenosis.  C4-5:  Disc degeneration and moderate spondylosis causing mild spinal stenosis and moderate foraminal narrowing bilaterally. There is mild flattening of the cord due to spurring  C5-6:  Moderate spondylosis with flattening of the cord and mild to moderate spinal stenosis.  Moderate foraminal narrowing bilaterally due to spurring.  C6-7:  Mild to moderate spondylosis.  Right  foraminal narrowing due to spurring.  C7-T1:  Negative  IMPRESSION: Cervical disc degeneration and spondylosis as above.  No acute disc protrusion.  MRI LUMBAR SPINE  Findings: 9 mm anterior slip L4 on L5 has progressed since the prior study.  No fracture is identified.  No mass lesion.  Conus medullaris is normal and terminates at L1-2.  T12-L1:  Negative  L1-2:  Small right-sided disc protrusion with disc bulging.  Mild right foraminal narrowing.  L2-3:  Diffuse disc bulging with mild spurring.  Mild facet degeneration and mild spinal stenosis.  Mild left foraminal narrowing.  L3-4:  Disc degeneration with diffuse disc bulging.  Moderate facet degeneration with mild to moderate spinal stenosis.  L4-5:  9 mm anterior slip.  This is associated  with severe facet degeneration and disc degeneration.  Interval  decompressive laminectomy since the prior MRI in 2005.  There is lateral recess encroachment bilaterally.  Mild central canal stenosis.  L5-S1:  Disc degeneration and spondylosis.  Bilateral facet hypertrophy with moderate foraminal narrowing bilaterally.  1 cm synovial cyst on the left projects posterior to the L5-S1 facet joint.  This was not present previously.  IMPRESSION: Mild spinal stenosis at L2-3 and mild to moderate stenosis at L2-3 due to disc and facet degeneration.  9 mm of slip of L4-L5 has progressed since 2005.  There has been interval posterior decompression at  this level. There is mild spinal stenosis however there is significant lateral recess encroachment especially on the right.  Foraminal encroachment bilaterally L5-S1 due to spondylosis and facet degeneration,  unchanged.  Original Report Authenticated By: Camelia Phenes, M.D.   Medications: reviewed.  Scheduled Meds:   . adenosine (ADENOCARD) IV  6 mg Intravenous Once  . baclofen  5 mg Oral TID  . cefTRIAXone (ROCEPHIN) IV  2 g Intravenous Q24H  . heparin  1,500 Units Intravenous Once  . heparin  1,500 Units Intravenous Once  . magnesium sulfate IVPB  2 g Intravenous Once  . pantoprazole  40 mg Oral Q1200  . DISCONTD: ciprofloxacin  500 mg Oral BID   Continuous Infusions:   . sodium chloride 150 mL/hr at 09/01/11 1022  . heparin 900 Units/hr (08/31/11 1924)   PRN Meds:.albuterol, cyclobenzaprine, HYDROcodone-acetaminophen, morphine injection, zolpidem Assessment/Plan: Principal Problem:  1.Salmonella enteritis: Diarrhea almost resolved. No bowel movement in 48hrs. Currently on iv ceftriaxone. Pt is afebrile, there was an elevated wbc count to 14,000, Unlikely from the enteritis.  Nevertheless we will continue with ceftriaxone in the hospital and discharge her on oral cipro, to complete a two week course of antibiotics.  2. Spinal Stenosis of the Lumbar spine: out patient follow up . Pain control and physical therapy.     3. Neck pain, acute: reviewed her MRI  Of the cervical spine, no disc bulges. Pain control  4. Severe Arthritis of hand: probably reactive arthritis vs severe osteoarthritis, ? Probably need intraarticular steroid injection, . Optimized pain medication, and ordered plain films of the hands to see the extent of pt's osteoarthritis. Pt sees [facility] as out pt, requested a consult from ortho to see if she needs intraarticular steroid injection vs systemic steroids( probably medrol dose pack)  Supraventricular tachycardia: resolved. No more episodes of arrythmia Elevated Troponin: most likely from demand ischemia. Discussed with Dr [name], can discontinue the iv heparin, do not anticipate any further cardio work up yet, but awaiting echo results.   Spoke to Dr Lendon Colonel, over the phone( rheumatologist), recommended medrol IM injection followed  by prednisone po taper over the next 12 days and outpt follow up.     LOS: 4 days   Drystan Reader 09/01/2011, 12:06 PM

## 2011-09-01 NOTE — Progress Notes (Signed)
Patient Name: [name] Date of Encounter: 09/01/2011    SUBJECTIVE:No cardiac complaints and denies dyspnea, chest pain, palpitations and neurological symptoms. No history of heart murmur.  TELEMETRY:  NSR, no SVT Filed Vitals:   09/01/11 0001 09/01/11 0500 09/01/11 0630 09/01/11 0829  BP:   109/68 111/59  Pulse:   85 93  Temp: 98.6 F (37 C)  98 F (36.7 C) 99.2 F (37.3 C)  TempSrc:    Oral  Resp:   18 17  Height:      Weight:  54.2 kg (119 lb 7.8 oz)    SpO2:   92% 90%    Intake/Output Summary (Last 24 hours) at 09/01/11 1325 Last data filed at 09/01/11 0830  Gross per 24 hour  Intake    540 ml  Output    900 ml  Net   -360 ml    LABS: Basic Metabolic Panel:  Basename 09/01/11 0645 08/31/11 0058  NA 134* 136  K 3.7 4.1  CL 105 104  CO2 22 23  GLUCOSE 84 111*  BUN 7 12  CREATININE 0.75 0.88  CALCIUM 7.9* 8.3*  MG 1.9 1.6  PHOS -- 3.7   CBC:  Basename 09/01/11 0645 08/31/11 0058  WBC 14.3* 9.5  NEUTROABS -- --  HGB 10.5* 10.2*  HCT 30.5* 29.6*  MCV 93.8 93.7  PLT 662* 641*   Cardiac Enzymes:  Basename 08/31/11 1615 08/31/11 1300 08/31/11 0113  CKTOTAL 108 83 31  CKMB 4.0 4.4* 2.8  CKMBINDEX -- -- --  TROPONINI 0.51* 0.60* 0.61*   BNP:  Basename 08/31/11 0059  POCBNP 1203.0*   Blood cultures: negative x 2  Radiology/Studies:  No new studie  Echocardiogram: thickened aortic valve with moderate AR. Normal LV function. Can't exclude AV vegetation.  Physical Exam: Blood pressure 111/59, pulse 93, temperature 99.2 F (37.3 C), temperature source Oral, resp. rate 17, height 5\' 3"  (1.6 m), weight 54.2 kg (119 lb 7.8 oz), SpO2 90.00%. Weight change: 4.5 kg (9 lb 14.7 oz)   The patient continues to wear the neck brace. The lungs are clear. There is 1/6 systolic murmur. No diastolic murmur is heard. Extremities are free of edema.  ASSESSMENT:  1. PSVT has not recurred. 2. Elevated cardiac markers due to demand ischemia. 3. Abnormal  echo with thickened aortic valve and moderate aortic regurgitation. Endocarditis is a consideration if clinically appropriate. With negative cultures, we should follow clinically but consider TEE if any suspicion of embolic phenomena or persistent infection.   Plan:  1. Continue antibiotic. 2. No need for ischemic evaluation at this time. 3. Consider endocarditis if clinical course is not as anticipated.  ODALY, PERI 09/01/2011, 1:25 PM

## 2011-09-01 NOTE — Progress Notes (Addendum)
INFECTIOUS DISEASES PROGRESS NOTE 77 yo F with Salmonella gastroenteritis now resolved Subjective: No diarrhea in 48hrs, remains afebrile. She underwent MRI, bilateral hand xrays, TTE for SVT event.   Abtx: cipro 11/12-11/15 Ceftriaxone 11/15- present Objective: Vital signs in last 24 hours: Temp:  [98 F (36.7 C)-101.3 F (38.5 C)] 99.8 F (37.7 C) (11/16 1500) Pulse Rate:  [80-95] 95  (11/16 1426) Resp:  [17-20] 17  (11/16 1426) BP: (109-132)/(59-70) 132/70 mmHg (11/16 1426) SpO2:  [90 %-96 %] 93 % (11/16 1426) Weight:  [54.2 kg (119 lb 7.8 oz)] 119 lb 7.8 oz (54.2 kg) (11/16 0500) Weight change: 4.5 kg (9 lb 14.7 oz) Last BM Date: 08/31/11 General Appearance:  Alert, cooperative, distressed from muscle cramps.   Head:  Normocephalic, without obvious abnormality, atraumatic   Neck:  Supple, symmetrical, trachea midline, no adenopathy;  thyroid: no enlargement/tenderness/nodules; no carotid  bruit or JVD   Back:  Tender in cervical and lumbar are, mild swelling in lumbar-sacral region   Lungs:  Clear to auscultation bilaterally, respirations unlabored   Chest Wall:  No tenderness or deformity   Heart:  Regular rate and rhythm, S1 and S2 normal, no murmur, rub  or gallop   Abdomen:  Soft, mildly distended,normal bowel sounds, sore to dep palpation without rigidity or gaurding, no masses, no organomegaly   Extremities:  B/l swelling and pain in both upper extremity, lower extremities norma   Pulses:  2+ and symmetric all extremities   Skin:  Skin color, texture, turgor normal, no rashes or lesions   Lymph nodes:  Cervical, supraclavicular, and axillary nodes normal   Neurologic:  CNII-XII intact, normal strength, sensation and reflexes  throughout      Lab Results:  [facility] 09/01/11 0645 08/31/11 0058  WBC 14.3* 9.5  HGB 10.5* 10.2*  HCT 30.5* 29.6*  PLT 662* 641*   BMET  Basename 09/01/11 0645 08/31/11 0058  NA 134* 136  K 3.7 4.1  CL 105 104  CO2 22 23    GLUCOSE 84 111*  BUN 7 12  CREATININE 0.75 0.88  CALCIUM 7.9* 8.3*   MICRO: 11/12 blood x2 :NGTD 11/13 stool :NGTD 11/13 cdiff Studies/Results:  MRI 11/16: Mild spinal stenosis at L2-3 and mild to moderate stenosis at L2-3 due to disc and facet degeneration.  9 mm of slip of L4-L5 has progressed since 2005.  There has been interval posterior decompression at  this level. There is mild spinal stenosis however there is significant lateral recess encroachment especially on the right.  Foraminal encroachment bilaterally L5-S1 due to spondylosis and facet degeneration,  unchanged. Xray: L-HAND: Findings: Severe osteoarthritis and spurring of the first through the fifth interphalangeal DIP joints.  Moderate osteoarthritis in the PIP joints.  Joint space widening   of the first metacarpal phalangeal joint due to ligamentous injury or laxity.  Degenerative changes of the base of the thumb with spurring and bone fragmentation.  Calcification adjacent to the ulnar styloid may be due to old fracture or chondrocalcinosis.  Negative for fracture.  No erosion.  IMPRESSION: Advanced osteoarthritis without fracture. R-HAND: Fusion of the first metacarpal phalangeal joint with a threaded screw and pin.  Advanced osteoarthritis and spurring of the first interphalangeal joint.  Prosthesis is present in the second PIP joint.  There is osteoarthritis with spurring of the DIP joints as well as the fifth PIP joint.  There is degenerative change at the base of the thumb. No erosions.   Negative for acute fracture.  Possible  chronic fracture of the ulnar styloid.  Soft tissue calcifications medial to the triquetrum may be due to old injury.  IMPRESSION: Advanced osteoarthritis.  Negative for fracture.   Assessment/Plan: 77 yo F with salmonella gastroenteritis, admitted for dehydration, and now diarrhea has resolved. No bacteremia nor imaging to suggest osteomyelitis.-- currently on ceftriaxone  1) salmonella  gastroenteritis - antibiotics is generally not warranted, unless thought to be complicated by requiring hospitalization, as it is for this patient. Length of treatment is usually a short-course < 5 days, once patient has resolved diarrhea, or no longer febrile. Since this is the case for this patient, I recommend to discontinue ceftriaxone tomorrow. Patient does not need antibiotics for salmonella gastroenteritis anymore. She did not have any bacteremia which would place her at risk for disseminated infections, including osteomyelitis.  2) leukocytosis= unclear  Etiology. Recommend to check cbc with diff in the morning. Would do pan-culture if patient has temp > 100.3. Patient did have exposure to ciprofloxacin for a few days, thus have a low threshold to consider c.difficile infection.  3) osteoarthritis = not-likely related to infection, probably having reactive arthritis episode  LOS: 4 days   Berdell Hostetler 09/01/2011, 4:27 PM

## 2011-09-01 NOTE — Progress Notes (Signed)
INITIAL ADULT NUTRITION ASSESSMENT Date: 09/01/2011   Time: 11:51 AM  Reason for Assessment: Nutrition Risk Report (unintentional wt loss)  ASSESSMENT: Female 77 y.o.  Dx: Salmonella enteritis  Hx:  Past Medical History  Diagnosis Date  . GERD (gastroesophageal reflux disease)   . Rotator cuff tear, left   . Arthritis     "osteoarthritis"  . Shortness of breath 08/28/11    "hard time breathing deeply because of the pain"  . Diarrhea 08/28/11    "for the last 17 days; from samonella poisoning"  . Headache 08/28/11    "just for the last few days; releated to dehydration"   Related Meds:     . adenosine (ADENOCARD) IV  6 mg Intravenous Once  . baclofen  5 mg Oral TID  . cefTRIAXone (ROCEPHIN) IV  2 g Intravenous Q24H  . heparin  1,500 Units Intravenous Once  . heparin  1,500 Units Intravenous Once  . magnesium sulfate IVPB  2 g Intravenous Once  . pantoprazole  40 mg Oral Q1200  . DISCONTD: ciprofloxacin  500 mg Oral BID   Ht: 5\' 3"  (160 cm) Wt: 119 lb 7.8 oz (54.2 kg)  Ideal Wt: 52.3kg % Ideal Wt: 104%  Usual Wt: 107-108kg (48.9 kg) % Usual Wt: 111%  Body mass index is 21.17 kg/(m^2). Wt is WNL.  Food/Nutrition Related Hx: diarrhea, poor PO intake, weight loss x 2 week PTA  Labs:  BMET    Component Value Date/Time   NA 134* 09/01/2011 0645   K 3.7 09/01/2011 0645   CL 105 09/01/2011 0645   CO2 22 09/01/2011 0645   GLUCOSE 84 09/01/2011 0645   BUN 7 09/01/2011 0645   CREATININE 0.75 09/01/2011 0645   CALCIUM 7.9* 09/01/2011 0645   GFRNONAA 80* 09/01/2011 0645   GFRAA >90 09/01/2011 0645    I/O last 3 completed shifts: In: 4128.1 [P.O.:840; I.V.:2288.1; IV Piggyback:1000] Out: 901 [Urine:901] Total I/O In: 60 [P.O.:60] Out: -   Diet Order: Regular  Supplements/Tube Feeding: none  IVF:    sodium chloride Last Rate: 150 mL/hr at 09/01/11 1022  heparin Last Rate: 900 Units/hr (08/31/11 1924)    Estimated Nutritional Needs:   Kcal:  1500-1700 Protein: 60-75 g Fluid: 1.5 - 1.7 L/d  Pt admitted with salmonella enteritis s/p chef-prepared meal at home 2 weeks ago. Pt suspects "peanut sauce" to be the culprit, but unsure at this time.  Has had diarrhea and poor PO intake since then. Weight loss of approximately 10# in the last 2 weeks. Per patient, usual wt is ~107-108 lb, but admitted to hospital at 97 lb. This is a weight loss of 9.8% x 2 weeks 2/2 diarrhea and poor PO intake. Pt meets criteria for moderate malnutrition in the context of acute illness. Current wt 119 lb (skewed 2/2 fluid).  Currently on Regular diet just advanced from bland diet, eating 50-75%. Reports eating half french toast, half of eggs, and juice for breakfast. Declined scheduled oral supplement such as [employer] but agreed to prn supplementation.  NUTRITION DIAGNOSIS: -Inadequate protein energy intake (NI-5.3).  Status: Ongoing  RELATED TO: acute illness  AS EVIDENCE BY: wt loss of ~9.8% x 2 weeks and poor PO intake PTA.  MONITORING/EVALUATION(Goals): Goal: Pt to complete >75% of meals. Monitor: PO intake, weights, labs, diet tolerance  EDUCATION NEEDS: -No education needs identified at this time  INTERVENTION: 1. Resource Breeze daily prn for additional protein if pt requests 2. RD to continue to follow care plan  Dietitian #:[id number]  DOCUMENTATION CODES Per approved criteria  -Non-severe (moderate) malnutrition in the context of acute illness or injury    Adair Laundry 09/01/2011, 11:51 AM

## 2011-09-01 NOTE — Progress Notes (Signed)
ANTICOAGULATION CONSULT NOTE - Follow Up Consult  Pharmacy Consult for Heparin Indication: SVT/NSTEMI  No Known Allergies  Patient Measurements: Height: 5\' 3"  (160 cm) Weight: 119 lb 7.8 oz (54.2 kg) IBW/kg (Calculated) : 52.4  Heparin weight: 54.2  Vital Signs: Temp: 99.2 F (37.3 C) (11/16 0829) Temp src: Oral (11/16 0829) BP: 111/59 mmHg (11/16 0829) Pulse Rate: 93  (11/16 0829)  Labs:  Basename 09/01/11 0830 09/01/11 0645 08/31/11 1803 08/31/11 1615 08/31/11 1300 08/31/11 0113 08/31/11 0058 08/30/11 0621  HGB -- 10.5* -- -- -- -- 10.2* --  HCT -- 30.5* -- -- -- -- 29.6* --  PLT -- 662* -- -- -- -- 641* --  APTT -- -- -- -- -- -- -- --  LABPROT -- -- -- -- -- -- -- --  INR -- -- -- -- -- -- -- --  HEPARINUNFRC <0.10* -- <0.10* -- -- -- -- --  CREATININE -- 0.75 -- -- -- -- 0.88 0.87  CKTOTAL -- -- -- 108 83 31 -- --  CKMB -- -- -- 4.0 4.4* 2.8 -- --  TROPONINI -- -- -- 0.51* 0.60* 0.61* -- --   Estimated Creatinine Clearance: 49.5 ml/min (by C-G formula based on Cr of 0.75).   Medications:  Prescriptions prior to admission  Medication Sig Dispense Refill  . ACAI PO Take 1 tablet by mouth daily.        . Ascorbic Acid (VITAMIN C) 1000 MG tablet Take 1,000 mg by mouth daily.        Marland Kitchen BIOTIN PO Take 1 tablet by mouth daily.        . calcium citrate-vitamin D 200-200 MG-UNIT TABS Take 1 tablet by mouth daily.        . Coenzyme Q10 (CO Q 10 PO) Take 1 tablet by mouth daily.        Marland Kitchen estradiol (VIVELLE-DOT) 0.0375 MG/24HR Place 1 patch onto the skin 2 (two) times a week. Patient changes patch on Mondays and Fridays       . fish oil-omega-3 fatty acids 1000 MG capsule Take 2 g by mouth daily.        . Multiple Vitamins-Minerals (MULTIVITAMINS THER. W/MINERALS) TABS Take 1 tablet by mouth daily.        Marland Kitchen omeprazole (PRILOSEC) 40 MG capsule Take 40 mg by mouth daily.        Marland Kitchen OVER THE COUNTER MEDICATION Take 2 tablets by mouth daily. Osteo biflex        Scheduled:    .  adenosine (ADENOCARD) IV  6 mg Intravenous Once  . baclofen  5 mg Oral TID  . cefTRIAXone (ROCEPHIN) IV  2 g Intravenous Q24H  . heparin  1,500 Units Intravenous Once  . heparin  1,500 Units Intravenous Once  . heparin  1,500 Units Intravenous Once  . magnesium sulfate IVPB  2 g Intravenous Once  . pantoprazole  40 mg Oral Q1200  . DISCONTD: ciprofloxacin  500 mg Oral BID    Assessment: 77 y.o. F on heparin for NSTEMI with a undetectable heparin level this a.m. (drawn late) Heparin infusing at proper rate. Hgb/Hct/Plt stable. No bleeding noted. Will re-bolus and increase rate to reach goal.  Goal of Therapy:  Heparin level 0.3-0.7 units/ml   Plan:  1. Heparin bolus of 1500 units x 1 2. Increase heparin drip rate to 1100 units/ hr (11 ml/hr) 3. Will obtain heparin level 6 hours after drip rate changed.  Georgina Pillion Ann 09/01/2011,9:54 AM

## 2011-09-01 NOTE — Progress Notes (Signed)
Pt is off unit

## 2011-09-01 NOTE — Progress Notes (Signed)
*  PRELIMINARY RESULTS* Echocardiogram 2D Echocardiogram has been performed.  Glean Salen Antonio RDCS 09/01/2011, 11:30 AM

## 2011-09-02 DIAGNOSIS — A020 Salmonella enteritis: Secondary | ICD-10-CM

## 2011-09-02 LAB — CBC
HCT: 32.8 % — ABNORMAL LOW (ref 36.0–46.0)
Hemoglobin: 11.3 g/dL — ABNORMAL LOW (ref 12.0–15.0)
MCV: 93.2 fL (ref 78.0–100.0)
WBC: 19.8 10*3/uL — ABNORMAL HIGH (ref 4.0–10.5)

## 2011-09-02 LAB — BASIC METABOLIC PANEL
BUN: 12 mg/dL (ref 6–23)
CO2: 23 mEq/L (ref 19–32)
Chloride: 103 mEq/L (ref 96–112)
Creatinine, Ser: 0.64 mg/dL (ref 0.50–1.10)
Glucose, Bld: 96 mg/dL (ref 70–99)
Potassium: 4.2 mEq/L (ref 3.5–5.1)

## 2011-09-02 LAB — DIFFERENTIAL
Basophils Absolute: 0 10*3/uL (ref 0.0–0.1)
Lymphocytes Relative: 11 % — ABNORMAL LOW (ref 12–46)
Monocytes Relative: 7 % (ref 3–12)
Neutro Abs: 16.2 10*3/uL — ABNORMAL HIGH (ref 1.7–7.7)

## 2011-09-02 LAB — STOOL CULTURE: Special Requests: NORMAL

## 2011-09-02 LAB — GLUCOSE, CAPILLARY: Glucose-Capillary: 174 mg/dL — ABNORMAL HIGH (ref 70–99)

## 2011-09-02 MED ORDER — LEVOFLOXACIN IN D5W 500 MG/100ML IV SOLN
500.0000 mg | Freq: Once | INTRAVENOUS | Status: AC
  Administered 2011-09-02: 500 mg via INTRAVENOUS
  Filled 2011-09-02: qty 100

## 2011-09-02 MED ORDER — SODIUM CHLORIDE 0.9 % IV SOLN
500.0000 mg | Freq: Two times a day (BID) | INTRAVENOUS | Status: DC
  Administered 2011-09-02 (×2): 500 mg via INTRAVENOUS
  Filled 2011-09-02 (×3): qty 500

## 2011-09-02 MED ORDER — PIPERACILLIN-TAZOBACTAM 3.375 G IVPB
3.3750 g | Freq: Three times a day (TID) | INTRAVENOUS | Status: DC
  Administered 2011-09-02 – 2011-09-03 (×4): 3.375 g via INTRAVENOUS
  Filled 2011-09-02 (×5): qty 50

## 2011-09-02 MED ORDER — PREDNISONE 20 MG PO TABS
20.0000 mg | ORAL_TABLET | Freq: Every day | ORAL | Status: DC
  Administered 2011-09-02 – 2011-09-03 (×2): 20 mg via ORAL
  Filled 2011-09-02 (×3): qty 1

## 2011-09-02 MED ORDER — PREDNISONE 5 MG PO TABS: 5.0000 mg | ORAL_TABLET | Freq: Every day | ORAL | Status: DC

## 2011-09-02 MED ORDER — LEVOFLOXACIN IN D5W 250 MG/50ML IV SOLN
250.0000 mg | INTRAVENOUS | Status: DC
  Filled 2011-09-02: qty 50

## 2011-09-02 MED ORDER — PREDNISONE 10 MG PO TABS: 10.0000 mg | ORAL_TABLET | Freq: Every day | ORAL | Status: DC

## 2011-09-02 MED ORDER — PREDNISONE 5 MG PO TABS: 15.0000 mg | ORAL_TABLET | Freq: Every day | ORAL | Status: DC

## 2011-09-02 NOTE — Progress Notes (Signed)
ANTIBIOTIC CONSULT NOTE - INITIAL  Pharmacy Consult for Vancomycin/Zosyn/Levaquin Indication: HCAP -  pneumonia  No Known Allergies  Patient Measurements: Height: 5\' 3"  (160 cm) Weight: 119 lb 7.8 oz (54.2 kg) IBW/kg (Calculated) : 52.4  Adjusted Body Weight: NA  Vital Signs: Temp: 97.6 F (36.4 C) (11/17 0900) Temp src: Oral (11/17 0900) BP: 118/71 mmHg (11/17 0900) Pulse Rate: 83  (11/17 0900) Intake/Output from previous day: 11/16 0701 - 11/17 0700 In: 1392.5 [P.O.:810; I.V.:582.5] Out: 1650 [Urine:1650] Intake/Output from this shift: Total I/O In: 120 [P.O.:120] Out: 150 [Urine:150]  Labs:  [facility] 09/02/11 0740 09/01/11 0645 08/31/11 0058  WBC 19.8* 14.3* 9.5  HGB 11.3* 10.5* 10.2*  PLT 736* 662* 641*  LABCREA -- -- --  CREATININE 0.64 0.75 0.88   Estimated Creatinine Clearance: 49.5 ml/min (by C-G formula based on Cr of 0.64). No results found for this basename: VANCOTROUGH:2,VANCOPEAK:2,VANCORANDOM:2,GENTTROUGH:2,GENTPEAK:2,GENTRANDOM:2,TOBRATROUGH:2,TOBRAPEAK:2,TOBRARND:2,AMIKACINPEAK:2,AMIKACINTROU:2,AMIKACIN:2, in the last 72 hours   Microbiology: Recent Results (from the past 720 hour(s))  CULTURE, BLOOD (ROUTINE X 2)     Status: Normal (Preliminary result)   Collection Time   08/28/11  3:17 PM      Component Value Range Status Comment   Specimen Description BLOOD LEFT ARM   Final    Special Requests BOTTLES DRAWN AEROBIC AND ANAEROBIC 10CC   Final    Setup Time [id number]   Final    Culture     Final    Value:        BLOOD CULTURE RECEIVED NO GROWTH TO DATE CULTURE WILL BE HELD FOR 5 DAYS BEFORE ISSUING A FINAL NEGATIVE REPORT   Report Status PENDING   Incomplete   CULTURE, BLOOD (ROUTINE X 2)     Status: Normal (Preliminary result)   Collection Time   08/28/11  3:24 PM      Component Value Range Status Comment   Specimen Description BLOOD LEFT WRIST   Final    Special Requests BOTTLES DRAWN AEROBIC ONLY 7CC   Final    Setup Time [id number]    Final    Culture     Final    Value:        BLOOD CULTURE RECEIVED NO GROWTH TO DATE CULTURE WILL BE HELD FOR 5 DAYS BEFORE ISSUING A FINAL NEGATIVE REPORT   Report Status PENDING   Incomplete   CLOSTRIDIUM DIFFICILE BY PCR     Status: Normal   Collection Time   08/29/11  7:05 PM      Component Value Range Status Comment   C difficile by pcr NEGATIVE  NEGATIVE  Final   STOOL CULTURE     Status: Normal (Preliminary result)   Collection Time   08/29/11  7:05 PM      Component Value Range Status Comment   Specimen Description STOOL   Final    Special Requests Normal   Final    Culture NO SUSPICIOUS COLONIES, CONTINUING TO HOLD   Final    Report Status PENDING   Incomplete   CULTURE, BLOOD (ROUTINE X 2)     Status: Normal (Preliminary result)   Collection Time   09/01/11  4:30 PM      Component Value Range Status Comment   Specimen Description BLOOD RIGHT ARM   Final    Special Requests BOTTLES DRAWN AEROBIC AND ANAEROBIC 10CC   Final    Setup Time [id number]   Final    Culture     Final    Value:  BLOOD CULTURE RECEIVED NO GROWTH TO DATE CULTURE WILL BE HELD FOR 5 DAYS BEFORE ISSUING A FINAL NEGATIVE REPORT   Report Status PENDING   Incomplete   CULTURE, BLOOD (ROUTINE X 2)     Status: Normal (Preliminary result)   Collection Time   09/01/11  4:35 PM      Component Value Range Status Comment   Specimen Description BLOOD RIGHT HAND   Final    Special Requests BOTTLES DRAWN AEROBIC ONLY 10CC   Final    Setup Time [id number]   Final    Culture     Final    Value:        BLOOD CULTURE RECEIVED NO GROWTH TO DATE CULTURE WILL BE HELD FOR 5 DAYS BEFORE ISSUING A FINAL NEGATIVE REPORT   Report Status PENDING   Incomplete     Medical History: Past Medical History  Diagnosis Date  . GERD (gastroesophageal reflux disease)   . Rotator cuff tear, left   . Arthritis     "osteoarthritis"  . Shortness of breath 08/28/11    "hard time breathing deeply because of the pain"  .  Diarrhea 08/28/11    "for the last 17 days; from samonella poisoning"  . Headache 08/28/11    "just for the last few days; releated to dehydration"    Medications:  Prescriptions prior to admission  Medication Sig Dispense Refill  . ACAI PO Take 1 tablet by mouth daily.        . Ascorbic Acid (VITAMIN C) 1000 MG tablet Take 1,000 mg by mouth daily.        Marland Kitchen BIOTIN PO Take 1 tablet by mouth daily.        . calcium citrate-vitamin D 200-200 MG-UNIT TABS Take 1 tablet by mouth daily.        . Coenzyme Q10 (CO Q 10 PO) Take 1 tablet by mouth daily.        Marland Kitchen estradiol (VIVELLE-DOT) 0.0375 MG/24HR Place 1 patch onto the skin 2 (two) times a week. Patient changes patch on Mondays and Fridays       . fish oil-omega-3 fatty acids 1000 MG capsule Take 2 g by mouth daily.        . Multiple Vitamins-Minerals (MULTIVITAMINS THER. W/MINERALS) TABS Take 1 tablet by mouth daily.        Marland Kitchen omeprazole (PRILOSEC) 40 MG capsule Take 40 mg by mouth daily.        Marland Kitchen OVER THE COUNTER MEDICATION Take 2 tablets by mouth daily. Osteo biflex        Assessment: Pt. Continues to have leukocytosis despite IV Rocephin.  She is being worked up for HCAP and antibiotic coverage has been changed.  Goal of Therapy:  Vancomycin trough level 15-20 mcg/ml  Plan: Vancomycin 500 mg IV every 12 hours Zosyn 3.375 gm IV every 8 hours Levaquin 500 mg IV x 1 then 250 mg IV daily Measure antibiotic drug levels at steady state  [name], [employer] 09/02/2011,10:31 AM

## 2011-09-02 NOTE — Progress Notes (Signed)
Subjective: Feel much better. Swelling has improved  Objective: Vital signs in last 24 hours: Filed Vitals:   09/02/11 0900 09/02/11 1209 09/02/11 1230 09/02/11 1300  BP: 118/71   111/71  Pulse: 83   85  Temp: 97.6 F (36.4 C)   98.2 F (36.8 C)  TempSrc: Oral   Oral  Resp: 18   18  Height:      Weight:      SpO2: 95% 97% 95% 94%   Weight change:   Intake/Output Summary (Last 24 hours) at 09/02/11 1614 Last data filed at 09/02/11 1300  Gross per 24 hour  Intake 1552.5 ml  Output   1201 ml  Net  351.5 ml   Physical Exam:   General Appearance:    Alert, cooperative, no distress,  Lungs:     Clear to auscultation bilaterally, no wheezing or rhonchi   Heart:    Regular rate and rhythm, S1 and S2 normal, no murmur, rub   or gallop  Abdomen:     Soft, non-tender, bowel sounds active all four quadrants,  Extremities:   Swelling of the hands and the joints decreased. Tenderness decreased.  Pulses:   2+ and symmetric all extremities  Skin:   Skin color, texture, turgor normal, no rashes or lesions  Neurologic:   CNII-XII intact, normal strength, sensation and reflexes    throughout     Lab Results: Results for orders placed during the hospital encounter of 08/28/11 (from the past 24 hour(s))  URINALYSIS, ROUTINE W REFLEX MICROSCOPIC     Status: Abnormal   Collection Time   09/01/11  8:15 PM      Component Value Range   Color, Urine YELLOW  YELLOW    Appearance HAZY (*) CLEAR    Specific Gravity, Urine 1.008  1.005 - 1.030    pH 5.5  5.0 - 8.0    Glucose, UA NEGATIVE  NEGATIVE (mg/dL)   Hgb urine dipstick NEGATIVE  NEGATIVE    Bilirubin Urine NEGATIVE  NEGATIVE    Ketones, ur 15 (*) NEGATIVE (mg/dL)   Protein, ur NEGATIVE  NEGATIVE (mg/dL)   Urobilinogen, UA 0.2  0.0 - 1.0 (mg/dL)   Nitrite NEGATIVE  NEGATIVE    Leukocytes, UA SMALL (*) NEGATIVE   URINE MICROSCOPIC-ADD ON     Status: Abnormal   Collection Time   09/01/11  8:15 PM      Component Value Range   Squamous Epithelial / LPF MANY (*) RARE    WBC, UA 7-10  <3 (WBC/hpf)   Bacteria, UA RARE  RARE    Urine-Other RARE YEAST    CBC     Status: Abnormal   Collection Time   09/02/11  7:40 AM      Component Value Range   WBC 19.8 (*) 4.0 - 10.5 (K/uL)   RBC 3.52 (*) 3.87 - 5.11 (MIL/uL)   Hemoglobin 11.3 (*) 12.0 - 15.0 (g/dL)   HCT 72.5 (*) 36.6 - 46.0 (%)   MCV 93.2  78.0 - 100.0 (fL)   MCH 32.1  26.0 - 34.0 (pg)   MCHC 34.5  30.0 - 36.0 (g/dL)   RDW 44.0  34.7 - 42.5 (%)   Platelets 736 (*) 150 - 400 (K/uL)  BASIC METABOLIC PANEL     Status: Abnormal   Collection Time   09/02/11  7:40 AM      Component Value Range   Sodium 136  135 - 145 (mEq/L)   Potassium 4.2  3.5 -  5.1 (mEq/L)   Chloride 103  96 - 112 (mEq/L)   CO2 23  19 - 32 (mEq/L)   Glucose, Bld 96  70 - 99 (mg/dL)   BUN 12  6 - 23 (mg/dL)   Creatinine, Ser 6.29  0.50 - 1.10 (mg/dL)   Calcium 8.8  8.4 - 52.8 (mg/dL)   GFR calc non Af Amer 85 (*) >90 (mL/min)   GFR calc Af Amer >90  >90 (mL/min)  DIFFERENTIAL     Status: Abnormal   Collection Time   09/02/11  7:40 AM      Component Value Range   Neutrophils Relative 82 (*) 43 - 77 (%)   Lymphocytes Relative 11 (*) 12 - 46 (%)   Monocytes Relative 7  3 - 12 (%)   Eosinophils Relative 0  0 - 5 (%)   Basophils Relative 0  0 - 1 (%)   Neutro Abs 16.2 (*) 1.7 - 7.7 (K/uL)   Lymphs Abs 2.2  0.7 - 4.0 (K/uL)   Monocytes Absolute 1.4 (*) 0.1 - 1.0 (K/uL)   Eosinophils Absolute 0.0  0.0 - 0.7 (K/uL)   Basophils Absolute 0.0  0.0 - 0.1 (K/uL)   Smear Review LARGE PLATELETS PRESENT      Micro Results: Recent Results (from the past 240 hour(s))  CULTURE, BLOOD (ROUTINE X 2)     Status: Normal (Preliminary result)   Collection Time   08/28/11  3:17 PM      Component Value Range Status Comment   Specimen Description BLOOD LEFT ARM   Final    Special Requests BOTTLES DRAWN AEROBIC AND ANAEROBIC 10CC   Final    Setup Time [id number]   Final    Culture     Final     Value:        BLOOD CULTURE RECEIVED NO GROWTH TO DATE CULTURE WILL BE HELD FOR 5 DAYS BEFORE ISSUING A FINAL NEGATIVE REPORT   Report Status PENDING   Incomplete   CULTURE, BLOOD (ROUTINE X 2)     Status: Normal (Preliminary result)   Collection Time   08/28/11  3:24 PM      Component Value Range Status Comment   Specimen Description BLOOD LEFT WRIST   Final    Special Requests BOTTLES DRAWN AEROBIC ONLY 7CC   Final    Setup Time [id number]   Final    Culture     Final    Value:        BLOOD CULTURE RECEIVED NO GROWTH TO DATE CULTURE WILL BE HELD FOR 5 DAYS BEFORE ISSUING A FINAL NEGATIVE REPORT   Report Status PENDING   Incomplete   CLOSTRIDIUM DIFFICILE BY PCR     Status: Normal   Collection Time   08/29/11  7:05 PM      Component Value Range Status Comment   C difficile by pcr NEGATIVE  NEGATIVE  Final   STOOL CULTURE     Status: Normal (Preliminary result)   Collection Time   08/29/11  7:05 PM      Component Value Range Status Comment   Specimen Description STOOL   Final    Special Requests Normal   Final    Culture NO SUSPICIOUS COLONIES, CONTINUING TO HOLD   Final    Report Status PENDING   Incomplete   CULTURE, BLOOD (ROUTINE X 2)     Status: Normal (Preliminary result)   Collection Time   09/01/11  4:30 PM  Component Value Range Status Comment   Specimen Description BLOOD RIGHT ARM   Final    Special Requests BOTTLES DRAWN AEROBIC AND ANAEROBIC 10CC   Final    Setup Time [id number]   Final    Culture     Final    Value:        BLOOD CULTURE RECEIVED NO GROWTH TO DATE CULTURE WILL BE HELD FOR 5 DAYS BEFORE ISSUING A FINAL NEGATIVE REPORT   Report Status PENDING   Incomplete   CULTURE, BLOOD (ROUTINE X 2)     Status: Normal (Preliminary result)   Collection Time   09/01/11  4:35 PM      Component Value Range Status Comment   Specimen Description BLOOD RIGHT HAND   Final    Special Requests BOTTLES DRAWN AEROBIC ONLY 10CC   Final    Setup Time [id number]    Final    Culture     Final    Value:        BLOOD CULTURE RECEIVED NO GROWTH TO DATE CULTURE WILL BE HELD FOR 5 DAYS BEFORE ISSUING A FINAL NEGATIVE REPORT   Report Status PENDING   Incomplete    Studies/Results: Dg Chest 2 View  09/01/2011  *RADIOLOGY REPORT*  Clinical Data: New onset of high grade fever  CHEST - 2 VIEW  Comparison: None.  Findings: There is parenchymal opacity in the left lower lobe with small effusions most consistent with left lower lobe pneumonia. The heart is mildly enlarged.  No bony abnormality is seen.  IMPRESSION: Left lower lobe pneumonia and small effusions.  Original Report Authenticated By: Juline Patch, M.D.   Dg Hand Complete Left  09/01/2011  *RADIOLOGY REPORT*  Clinical Data: Bilateral interphalangeal joint swelling  LEFT HAND - COMPLETE 3+ VIEW  Comparison: None.  Findings: Severe osteoarthritis and spurring of the first through the fifth interphalangeal DIP joints.  Moderate osteoarthritis in the PIP joints.  Joint space widening   of the first metacarpal phalangeal joint due to ligamentous injury or laxity.  Degenerative changes of the base of the thumb with spurring and bone fragmentation.  Calcification adjacent to the ulnar styloid may be due to old fracture or chondrocalcinosis.  Negative for fracture.  No erosion.  IMPRESSION: Advanced osteoarthritis without fracture.  Original Report Authenticated By: Camelia Phenes, M.D.   Dg Hand Complete Right  09/01/2011  *RADIOLOGY REPORT*  Clinical Data: Bilateral interphalangeal joint swelling  RIGHT HAND - COMPLETE 3+ VIEW  Comparison: None.  Findings: Fusion of the first metacarpal phalangeal joint with a threaded screw and pin.  Advanced osteoarthritis and spurring of the first interphalangeal joint.  Prosthesis is present in the second PIP joint.  There is osteoarthritis with spurring of the DIP joints as well as the fifth PIP joint.  There is degenerative change at the base of the thumb. No erosions.   Negative  for acute fracture.  Possible chronic fracture of the ulnar styloid.  Soft tissue calcifications medial to the triquetrum may be due to old injury.  IMPRESSION: Advanced osteoarthritis.  Negative for fracture.  Original Report Authenticated By: Camelia Phenes, M.D.   Medications: reviewed Scheduled Meds:   . adenosine (ADENOCARD) IV  6 mg Intravenous Once  . baclofen  5 mg Oral TID  . heparin subcutaneous  5,000 Units Subcutaneous Q8H  . levofloxacin (LEVAQUIN) IV  250 mg Intravenous Q24H  . levofloxacin (LEVAQUIN) IV  500 mg Intravenous Once  . methylPREDNISolone acetate PF  80  mg Intramuscular Once  . pantoprazole  40 mg Oral Q1200  . piperacillin-tazobactam (ZOSYN)  IV  3.375 g Intravenous Q8H  . predniSONE  10 mg Oral QAC breakfast  . predniSONE  15 mg Oral QAC breakfast  . predniSONE  20 mg Oral QAC breakfast  . predniSONE  5 mg Oral QAC breakfast  . vancomycin  500 mg Intravenous Q12H  . DISCONTD: cefTRIAXone (ROCEPHIN) IV  2 g Intravenous Q24H  . DISCONTD: cefTRIAXone (ROCEPHIN) IV  2 g Intravenous Q24H   Continuous Infusions:   . sodium chloride 50 mL/hr at 09/02/11 0626   PRN Meds:.acetaminophen, albuterol, cyclobenzaprine, feeding supplement, HYDROcodone-acetaminophen, morphine injection, traMADol, zolpidem Assessment/Plan: Principal Problem:  *Salmonella enteritis: diarrhea resolved.  HCAP: on Day 1 of abx. ID input appreciated. Will continue abx for one more day and de escalate in am.  SVT : resolved. And 2 d echo showed mod  aortic regurgitation.  ? Reactive arthrits: on tapering steroids. Improving.    LOS: 5 days   [name] 09/02/2011, 4:14 PM

## 2011-09-02 NOTE — Progress Notes (Signed)
Physical Therapy Evaluation Patient Details Name: Vergie Zahm MRN: [id number] DOB: [birth year] Today's Date: 09/02/2011  Problem List:  Patient Active Problem List  Diagnoses  . Salmonella enteritis  . Muscle spasm of back  . Dehydration  . GERD (gastroesophageal reflux disease)  . DJD (degenerative joint disease)  . Rotator cuff tear, left  . ARF (acute renal failure)  . Neck pain, acute  . Arthritis of hand  . Supraventricular tachycardia    Past Medical History:  Past Medical History  Diagnosis Date  . GERD (gastroesophageal reflux disease)   . Rotator cuff tear, left   . Arthritis     "osteoarthritis"  . Shortness of breath 08/28/11    "hard time breathing deeply because of the pain"  . Diarrhea 08/28/11    "for the last 17 days; from samonella poisoning"  . Headache 08/28/11    "just for the last few days; releated to dehydration"   Past Surgical History:  Past Surgical History  Procedure Date  . Back surgery ~ 2004    "stenosis"  . Facial cosmetic surgery ~ 2002  . Finger surgery ~ 2000    "titanium rod in right pointer; from arthritis"  . Tonsillectomy 1943  . Abdominal hysterectomy     "still have my ovaries"  . Tubal ligation 1970's  . Cataract extraction w/ intraocular lens  implant, bilateral Summer 2011    PT Assessment/Plan/Recommendation PT Assessment Clinical Impression Statement: Pt with significant weakness and balance deficits which result in pt being at increased risk for falls. Recommend supervision  with all ambulation at this point.  PT Recommendation/Assessment: Patient will need skilled PT in the acute care venue PT Problem List: Decreased strength;Decreased range of motion;Decreased activity tolerance;Decreased balance;Decreased mobility;Cardiopulmonary status limiting activity PT Therapy Diagnosis : Difficulty walking;Abnormality of gait;Generalized weakness;Acute pain PT Plan PT Frequency: Min 3X/week PT Treatment/Interventions:  DME instruction;Gait training;Functional mobility training;Therapeutic exercise;Balance training;Patient/family education (?DME instruction) PT Recommendation Recommendations for Other Services: OT consult (if not already ordered) Follow Up Recommendations: Home health PT Equipment Recommended: None recommended by PT PT Goals  Acute Rehab PT Goals PT Goal Formulation: With patient Time For Goal Achievement: 2 weeks Pt will go Supine/Side to Sit: Independently PT Goal: Supine/Side to Sit - Progress: Progressing toward goal Pt will Transfer Sit to Stand/Stand to Sit: with modified independence PT Transfer Goal: Sit to Stand/Stand to Sit - Progress: Progressing toward goal Pt will Ambulate: >150 feet;with supervision PT Goal: Ambulate - Progress: Progressing toward goal Additional Goals Additional Goal #1: Pt will score >/= 46/56  PT Evaluation Precautions/Restrictions  Precautions Precautions: Fall Precaution Comments: Pt needs assistance with ambulation Prior Functioning  Home Living Lives With: Spouse Type of Home: House Home Layout: Two level Alternate Level Stairs-Rails: Right Alternate Level Stairs-Number of Steps: 13 Home Access: Level entry Bathroom Shower/Tub: Health visitor: Standard Home Adaptive Equipment: None Prior Function Level of Independence: Independent with basic ADLs Driving: Yes Vocation: Retired Comments: Avid yoga participant, works out with a Psychologist, educational.  Cognition Cognition Arousal/Alertness: Awake/alert Overall Cognitive Status: Appears within functional limits for tasks assessed Sensation/Coordination Sensation Light Touch: Appears Intact Extremity Assessment RLE Assessment RLE Assessment: Exceptions to [facility] RLE AROM (degrees) Overall AROM Right Lower Extremity: Within functional limits for tasks assessed RLE Strength RLE Overall Strength: Deficits RLE Overall Strength Comments: generalized deconditioning, grossly >/= 3/5 (most  evident with functional tasks LLE Assessment LLE Assessment: Exceptions to WFL LLE AROM (degrees) Overall AROM Left Lower Extremity: Within functional limits for tasks  assessed LLE Strength LLE Overall Strength: Deficits LLE Overall Strength Comments: generalized deconditioning, grossly >/= 3/5 (most evident with functional tasks Mobility (including Balance) Bed Mobility Bed Mobility: Yes Supine to Sit: 6: Modified independent (Device/Increase time) (increased time) Transfers Transfers: Yes Sit to Stand: 5: Supervision Sit to Stand Details (indicate cue type and reason): initially for safety.  Stand to Sit: 5: Supervision Stand to Sit Details: for safety, able to sit without need for UEs Ambulation/Gait Ambulation/Gait: Yes Ambulation/Gait Assistance: 4: Min assist Ambulation/Gait Assistance Details (indicate cue type and reason): Verbal cues to monitor fatigue, assist secondary to lateral sway and some cross over stepping Ambulation Distance (Feet): 60 Feet Assistive device: None Gait Pattern: Decreased step length - left;Decreased stance time - right;Decreased stride length (decreased foot clearance bil.) Stairs: No  Posture/Postural Control Posture/Postural Control: No significant limitations Balance Balance Assessed: Yes Berg Balance Test Sit to Stand: Able to stand  independently using hands Standing Unsupported: Able to stand 2 minutes with supervision Sitting with Back Unsupported but Feet Supported on Floor or Stool: Able to sit safely and securely 2 minutes Stand to Sit: Sits safely with minimal use of hands Transfers: Able to transfer safely, minor use of hands Standing Unsupported with Eyes Closed: Able to stand 10 seconds with supervision Standing Ubsupported with Feet Together: Able to place feet together independently and stand for 1 minute with supervision From Standing, Reach Forward with Outstretched Arm: Reaches forward but needs supervision From Standing  Position, Pick up Object from Floor: Able to pick up shoe, needs supervision From Standing Position, Turn to Look Behind Over each Shoulder: Looks behind from both sides and weight shifts well (limited neck rotation secondary to stiffness) Turn 360 Degrees: Needs assistance while turning (loss of balance posteriorly) Standing Unsupported, Alternately Place Feet on Step/Stool: Able to complete >2 steps/needs minimal assist Standing Unsupported, One Foot in Front: Able to plae foot ahead of the other independently and hold 30 seconds (needs assistance for tandem) Standing on One Leg: Able to lift leg independently and hold equal to or more than 3 seconds Total Score: 38  Exercise  General Exercises - Lower Extremity Ankle Circles/Pumps: AROM;Both;15 reps;Seated End of Session PT - End of Session Equipment Utilized During Treatment: Gait belt Activity Tolerance: Patient tolerated treatment well;Patient limited by fatigue Patient left: in chair;with call bell in reach;with family/visitor present Nurse Communication: Mobility status for transfers;Mobility status for ambulation General Behavior During Session: [name] for tasks performed Cognition: [facility] for tasks performed  Sherie Don 09/02/2011, 1:03 PM  Sherie Don) Carleene Mains PT, DPT Acute Rehabilitation [phone]

## 2011-09-02 NOTE — Progress Notes (Signed)
Subjective:  77 year old with thickened AV, moderate AR. Leukocytosis. PSVT. No further occurrence. No chest pain, no SOB. No cardiac complaints.   Objective:  Vital Signs in the last 24 hours: Temp:  [97.6 F (36.4 C)-101.3 F (38.5 C)] 97.6 F (36.4 C) (11/17 0500) Pulse Rate:  [74-95] 75  (11/17 0500) Resp:  [17-20] 17  (11/17 0500) BP: (110-132)/(66-70) 110/70 mmHg (11/17 0500) SpO2:  [93 %-97 %] 96 % (11/17 0500)  Intake/Output from previous day: 11/16 0701 - 11/17 0700 In: 1392.5 [P.O.:810; I.V.:582.5] Out: 1650 [Urine:1650]   Physical Exam: General: Well developed, well nourished, in no acute distress. Head:  Normocephalic and atraumatic. Lungs: Clear to auscultation and percussion. Heart: Normal S1 and S2.  Soft sysolic murmur Pulses: Pulses normal in all 4 extremities. Extremities: No clubbing or cyanosis. No edema. Neurologic: Alert and oriented x 3.    Lab Results:  [facility] 09/02/11 0740 09/01/11 0645  WBC 19.8* 14.3*  HGB 11.3* 10.5*  PLT 736* 662*    Basename 09/01/11 0645 08/31/11 0058  NA 134* 136  K 3.7 4.1  CL 105 104  CO2 22 23  GLUCOSE 84 111*  BUN 7 12  CREATININE 0.75 0.88    Basename 08/31/11 1615 08/31/11 1300  TROPONINI 0.51* 0.60*   Hepatic Function Panel  Basename 09/01/11 0645  PROT 5.2*  ALBUMIN 1.4*  AST 19  ALT 15  ALKPHOS 137*  BILITOT 0.2*  BILIDIR --  IBILI --   No results found for this basename: CHOL in the last 72 hours No results found for this basename: PROTIME in the last 72 hours  Imaging:   EKG:  TELE reviewed. No abnormalities.  Cardiac Studies:  Echocardiogram: thickened aortic valve with moderate AR. Normal LV function. Can't exclude AV vegetation.      Assessment/Plan:   1. PSVT has not recurred.  2. Elevated cardiac markers due to demand ischemia.  3. Abnormal echo with thickened aortic valve and moderate aortic regurgitation. Endocarditis is a consideration if clinically appropriate. With  negative cultures, we should follow clinically but consider TEE if any suspicion of embolic phenomena or persistent infection.  Plan:  1. Continue antibiotic.  2. No need for ischemic evaluation at this time.  3. Consider endocarditis if clinical course is not as anticipated.    [name] 09/02/2011, 8:48 AM

## 2011-09-02 NOTE — Progress Notes (Signed)
Subjective:    Patient ID: [name], female    DOB: 08-04-1933, 77 y.o.   MRN: [id number]  HPI     INFECTIOUS DISEASES PROGRESS NOTE 77 yo F with Salmonella gastroenteritis now resolved Subjective: No diarrhea in 72hrs She underwent MRI, bilateral hand xrays, TTE for SVT event.  Fever to 101.3 yesterday afternoon, afebrile since.     Abtx: cipro 11/12-11/15 Ceftriaxone 11/15- 11/17 Vanco/Zosyn/Levaquin 11/17 - present Objective: Vital signs in last 24 hours: BP 118/71  Pulse 83  Temp(Src) 97.6 F (36.4 C) (Oral)  Resp 18  Ht 5\' 3"  (1.6 m)  Wt 119 lb 7.8 oz (54.2 kg)  BMI 21.17 kg/m2  SpO2 95%  Last BM Date: 08/31/11 General Appearance:   Alert, cooperative, distressed from hands swelling, though improved.    Head:   Normocephalic, without obvious abnormality, atraumatic     Back:   Tender in cervical and lumbar are, mild swelling in lumbar-sacral region    Lungs:   Clear to auscultation bilaterally, respirations unlabored    Heart:   Regular rate and rhythm, S1 and S2 normal, no murmur, rub    or gallop    Abdomen:   Soft, ND,normal bowel sounds, sore to dep palpation without rigidity or gaurding, no masses, no organomegaly    Extremities:   B/l trace swelling and pain in both upper extremity  Pulses:   2+ and symmetric all extremities    Skin:   Skin color, texture, turgor normal, no rashes or lesions   + left arm hematoma Lymph nodes:   Cervical nl  Lab Results  Component Value Date   WBC 19.8* 09/02/2011   HGB 11.3* 09/02/2011   HCT 32.8* 09/02/2011   MCV 93.2 09/02/2011   PLT 736* 09/02/2011   MICRO: 11/12 blood x2 :NGTD 11/13 stool :NGTD 11/13 cdiff Studies/Results:   MRI 11/16: Mild spinal stenosis at L2-3 and mild to moderate stenosis at L2-3 due to disc and facet degeneration.  9 mm of slip of L4-L5 has progressed since 2005.  There has been interval posterior decompression at  this level. There is mild spinal stenosis however there is significant  lateral recess encroachment especially on the right.  Foraminal encroachment bilaterally L5-S1 due to spondylosis and facet degeneration,  unchanged. Xray: L-HAND: Findings: Severe osteoarthritis and spurring of the first through the fifth interphalangeal DIP joints.  Moderate osteoarthritis in the PIP joints.  Joint space widening   of the first metacarpal phalangeal joint due to ligamentous injury or laxity.  Degenerative changes of the base of the thumb with spurring and bone fragmentation.  Calcification adjacent to the ulnar styloid may be due to old fracture or chondrocalcinosis.  Negative for fracture.  No erosion.  IMPRESSION: Advanced osteoarthritis without fracture. R-HAND: Fusion of the first metacarpal phalangeal joint with a threaded screw and pin.  Advanced osteoarthritis and spurring of the first interphalangeal joint.  Prosthesis is present in the second PIP joint.  There is osteoarthritis with spurring of the DIP joints as well as the fifth PIP joint.  There is degenerative change at the base of the thumb. No erosions.   Negative for acute fracture.  Possible chronic fracture of the ulnar styloid.  Soft tissue calcifications medial to the triquetrum may be due to old injury.  IMPRESSION: Advanced osteoarthritis.  Negative for fracture.   CHEST - 2 VIEW  Comparison: None.  Findings: There is parenchymal opacity in the left lower lobe with  small effusions most consistent with left  lower lobe pneumonia.  The heart is mildly enlarged. No bony abnormality is seen.  IMPRESSION:  Left lower lobe pneumonia and small effusions.   Assessment/Plan: 77 yo F with salmonella gastroenteritis, admitted for dehydration, and now diarrhea has resolved. No bacteremia nor imaging to suggest osteomyelitis.-- ceftriaxone now stopped   1) salmonella gastroenteritis - antibiotics is generally not warranted, unless thought to be complicated by requiring hospitalization, as it is for this patient. Length of  treatment is usually a short-course < 5 days, once patient has resolved diarrhea, or no longer febrile. She is now without diarrhea, so no further antibiotics required for this.     2) leukocytosis= unclear  Etiology.  Doubt pneumonia with no hypoxia, no cough, no increased RR (I count 15/min) or respiratory distress, exam benign.  No indication for antibiotics for HCAP. Has increased from 14 to 19 likely in large part due to steroids.  1 fever spike yesterday but none since.  I recommend observation only.  Follow cultures.  No signs of IE on exam. TTE noted.    3) osteoarthritis = not-likely related to infection, probably having reactive arthritis episode       Review of Systems     Objective:   Physical Exam        Assessment & Plan:

## 2011-09-03 LAB — CBC
MCH: 31.8 pg (ref 26.0–34.0)
MCHC: 34.1 g/dL (ref 30.0–36.0)
MCV: 93.4 fL (ref 78.0–100.0)
Platelets: 764 10*3/uL — ABNORMAL HIGH (ref 150–400)
RDW: 14 % (ref 11.5–15.5)

## 2011-09-03 LAB — CULTURE, BLOOD (ROUTINE X 2)
Culture  Setup Time: 201211122338
Culture: NO GROWTH

## 2011-09-03 LAB — BASIC METABOLIC PANEL
Calcium: 8.4 mg/dL (ref 8.4–10.5)
Creatinine, Ser: 0.71 mg/dL (ref 0.50–1.10)
GFR calc Af Amer: >90 mL/min (ref >90)

## 2011-09-03 MED ORDER — PREDNISONE 5 MG PO TABS: 5.0000 mg | ORAL_TABLET | Freq: Every day | ORAL | Status: AC

## 2011-09-03 MED ORDER — BACLOFEN 5 MG HALF TABLET: 5.0000 mg | ORAL_TABLET | Freq: Three times a day (TID) | ORAL | Status: DC

## 2011-09-03 MED ORDER — PREDNISONE 5 MG PO TABS: 15.0000 mg | ORAL_TABLET | Freq: Every day | ORAL | Status: AC

## 2011-09-03 MED ORDER — HYDROCODONE-ACETAMINOPHEN 5-325 MG PO TABS: 1.0000 | ORAL_TABLET | ORAL | Status: AC | PRN

## 2011-09-03 MED ORDER — MOXIFLOXACIN HCL 400 MG PO TABS: 400.0000 mg | ORAL_TABLET | Freq: Every day | ORAL | Status: AC

## 2011-09-03 MED ORDER — ZOLPIDEM TARTRATE 5 MG PO TABS: 5.0000 mg | ORAL_TABLET | Freq: Every evening | ORAL | Status: DC | PRN

## 2011-09-03 MED ORDER — PREDNISONE 10 MG PO TABS: 10.0000 mg | ORAL_TABLET | Freq: Every day | ORAL | Status: AC

## 2011-09-03 MED ORDER — PREDNISONE 20 MG PO TABS: 20.0000 mg | ORAL_TABLET | Freq: Every day | ORAL | Status: AC

## 2011-09-03 NOTE — Progress Notes (Signed)
Patient agreeable to having HH PT with [facility]. Contacted [facility] for [facility] PT for scheduled d/c today.  [facility] delivered RW to room.

## 2011-09-03 NOTE — Progress Notes (Signed)
Subjective:  77 year old with thickened AV, moderate AR. Leukocytosis. PSVT. Elevated Troponin was in the setting of acute illness, demand ischemia. EF normal.  No further occurrence. Tele reviewed. No chest pain, no SOB. No cardiac complaints. Has "pockets of fluids" along her flanks she states.    Objective:  Vital Signs in the last 24 hours: Temp:  [97.6 F (36.4 C)-98.2 F (36.8 C)] 97.7 F (36.5 C) (11/18 0550) Pulse Rate:  [68-85] 68  (11/18 0550) Resp:  [18-19] 18  (11/17 2030) BP: (109-118)/(64-71) 118/69 mmHg (11/18 0550) SpO2:  [93 %-97 %] 94 % (11/18 0550) Weight:  [55.7 kg (122 lb 12.7 oz)] 122 lb 12.7 oz (55.7 kg) (11/17 2030)  Intake/Output from previous day: 11/17 0701 - 11/18 0700 In: 1800 [P.O.:1000; I.V.:600; IV Piggyback:200] Out: 751 [Urine:750; Stool:1]   Physical Exam: General: Well developed, well nourished, in no acute distress. Head:  Normocephalic and atraumatic. Lungs: Clear to auscultation and percussion. Heart: Normal S1 and S2.  No murmur, rubs or gallops.  Pulses: Pulses normal in all 4 extremities. Extremities: No clubbing or cyanosis. No edema. Neurologic: Alert and oriented x 3.    Lab Results:  [facility] 09/03/11 0556 09/02/11 0740  WBC 18.7* 19.8*  HGB 11.0* 11.3*  PLT 764* 736*    Basename 09/03/11 0556 09/02/11 0740  NA 135 136  K 4.0 4.2  CL 102 103  CO2 25 23  GLUCOSE 101* 96  BUN 14 12  CREATININE 0.71 0.64    Basename 08/31/11 1615 08/31/11 1300  TROPONINI 0.51* 0.60*   Hepatic Function Panel  Basename 09/01/11 0645  PROT 5.2*  ALBUMIN 1.4*  AST 19  ALT 15  ALKPHOS 137*  BILITOT 0.2*  BILIDIR --  IBILI --    Imaging:  CXR - LLL PNA  EKG:  PSVT, narrow complex tachycardia 170's personally reviewed from admit. TELE now unremarkable.   Cardiac Studies:  Echocardiogram: thickened aortic valve with moderate AR. Normal LV function.  Assessment/Plan:  Salmonella enteritis - per primary team. Had several days  of diarrhea (non bloody) after dinner party. 4/10 people got sick. Husband had diarrhea for four days. Self limiting.  On levaquin, prednisone, Zosyn, Vanco. PLT's increasing (acute phase reactant). Leukocytosis (in part prednisone).   Dehydration - improved with IVF. Probably can saline lock at this point. Per primary team.   ARF (acute renal failure) - per primary team, resolved with IVF, azotemia  Supraventricular tachycardia - likely AVNRT, auto-converted. No adenosine given. No return. On no beta blocker or calcium channel blocker currently. Stable. Normal EF.   Aortic regurgitation - moderate. Thickened valve. BCX are negative. If fevers persist, may consider TEE to evaluate for vegetation. For now continue medical Mgt.   Elevated Troponin - demand ischemia, PSVT in the setting of acute illness. No further workup at this time. EF normal.      [place] 09/03/2011, 8:09 AM

## 2011-09-03 NOTE — Discharge Summary (Signed)
PATIENT DETAILS Name: [name] Age: 77 y.o. Sex: female Date of Birth: 1932/11/08 MRN: [id number]. Admit Date: 08/28/2011 Admitting Physician: Leroy Sea, MD HYQ:MVHQION,GEXBMW R, MD  PRIMARY DISCHARGE DIAGNOSIS:  Principal Problem:  Salmonella enteritis: resolved.   Muscle spasm of back/ Neck Pain improving.  Dehydration resolved.  ARF (acute renal failure) resolved Reactive Arthritis: improving. Paroxysmal Supraventricular tachycardia Resolved.  Gerd stable. Pneumonia Aortic Regurgitation      PAST MEDICAL HISTORY: Past Medical History  Diagnosis Date  . GERD (gastroesophageal reflux disease)   . Rotator cuff tear, left   . Arthritis     "osteoarthritis"  . Shortness of breath 08/28/11    "hard time breathing deeply because of the pain"  . Diarrhea 08/28/11    "for the last 17 days; from samonella poisoning"  . Headache 08/28/11    "just for the last few days; releated to dehydration"    DISCHARGE MEDICATIONS: Current Discharge Medication List    START taking these medications   Details  baclofen (LIORESAL) 5 mg TABS Take 0.5 tablets (5 mg total) by mouth 3 (three) times daily. Qty: 15 tablet, Refills: 0    HYDROcodone-acetaminophen (NORCO) 5-325 MG per tablet Take 1 tablet by mouth every 4 (four) hours as needed. Qty: 30 tablet, Refills: 0    moxifloxacin (AVELOX) 400 MG tablet Take 1 tablet (400 mg total) by mouth daily. Qty: 4 tablet, Refills: 0    !! predniSONE (DELTASONE) 10 MG tablet Take 1 tablet (10 mg total) by mouth daily before breakfast. Qty: 3 tablet, Refills: 0    !! predniSONE (DELTASONE) 20 MG tablet Take 1 tablet (20 mg total) by mouth daily before breakfast. Qty: 1 tablet, Refills: 0    !! predniSONE (DELTASONE) 5 MG tablet Take 3 tablets (15 mg total) by mouth daily before breakfast. Qty: 3 tablet, Refills: 0    !! predniSONE (DELTASONE) 5 MG tablet Take 1 tablet (5 mg total) by mouth daily before breakfast. Qty: 3  tablet, Refills: 0     !! - Potential duplicate medications found. Please discuss with provider.    CONTINUE these medications which have NOT CHANGED   Details  ACAI PO Take 1 tablet by mouth daily.      Ascorbic Acid (VITAMIN C) 1000 MG tablet Take 1,000 mg by mouth daily.      BIOTIN PO Take 1 tablet by mouth daily.      calcium citrate-vitamin D 200-200 MG-UNIT TABS Take 1 tablet by mouth daily.      Coenzyme Q10 (CO Q 10 PO) Take 1 tablet by mouth daily.      estradiol (VIVELLE-DOT) 0.0375 MG/24HR Place 1 patch onto the skin 2 (two) times a week. Patient changes patch on Mondays and Fridays     fish oil-omega-3 fatty acids 1000 MG capsule Take 2 g by mouth daily.      Multiple Vitamins-Minerals (MULTIVITAMINS THER. W/MINERALS) TABS Take 1 tablet by mouth daily.      omeprazole (PRILOSEC) 40 MG capsule Take 40 mg by mouth daily.      OVER THE COUNTER MEDICATION Take 2 tablets by mouth daily. Osteo biflex          BRIEF HPI and Hospital course.  See H&P, Labs, Consult and Test reports for all details in brief, patient was admitted for worsening diarrhea.  2 days after the admission patient developed paroxysmal supraventricular tachycardia associated with hypotension she was transferred to ICU, was appropriately hydrated, PSVT  resolved spontaneously did not  require any adenosine infusion. Cardiology consult was obtained who recommended  serial cardiac markers and a 2-D echocardiogram. Her cardiac enzymes were elevated probably secondary to demand ischemia. Her daily echocardiogram shows moderate aortic regurgitation. Cardiology recommended outpatient followup as needed. Patient also tablet fever on November 16 with a temp of 101 septic workup was standing in her chest x-ray showed left lower lobe pneumonia. She was started on IV antibiotics including IV vancomycin, IV Zosyn and Levaquin with the next 48 hours patient remained afebrile her leukocytosis improved her IV antibiotics de  escalated to a by mouth aelox she is being discharged home. With the course of her hospitalization she patient developed diffuse bilateral hand swelling with interphalangeal and metacarpophalangeal joint swelling. The symptoms 2 reactive arthritis in view of her Salmonella enteritis. Rheumatologist Dr. Nickola Major was called over the phone for further recommendations. RecommendedDepo IV Solu-Medrol injection of 80 mg followed by a prednisone taper and outpatient follow up with her in about one week  CONSULTATIONS:  Cardiology  [facility]  Infectious Disease Rheumatology from Dr Nickola Major   PERTINENT RADIOLOGIC STUDIES: Dg Chest 2 View  09/01/2011  *RADIOLOGY REPORT*  Clinical Data: New onset of high grade fever  CHEST - 2 VIEW  Comparison: None.  Findings: There is parenchymal opacity in the left lower lobe with small effusions most consistent with left lower lobe pneumonia. The heart is mildly enlarged.  No bony abnormality is seen.  IMPRESSION: Left lower lobe pneumonia and small effusions.  Original Report Authenticated By: Juline Patch, M.D.   Dg Cervical Spine 2-3 Views  08/28/2011  *RADIOLOGY REPORT*  Clinical Data: Posterior neck pain.  Clinical diagnosis of neck spasms.  CERVICAL SPINE - 2-3 VIEW  Comparison: None.  Findings: AP and lateral views of the cervical spine demonstrate reversal of the normal cervical lordosis.  Multilevel degenerative changes are noted as well as levoconvex cervicothoracic scoliosis.  IMPRESSION: Multilevel degenerative changes and reversal of the normal cervical lordosis.  Original Report Authenticated By: Darrol Angel, M.D.   Mr Cervical Spine Wo Contrast  08/31/2011  *RADIOLOGY REPORT*  Clinical Data:  Back pain and spasms.  Neck pain  MRI CERVICAL AND LUMBAR SPINE WITHOUT CONTRAST  Technique:  Multiplanar and multiecho pulse sequences of the cervical spine, to include the craniocervical junction and cervicothoracic junction, and lumbar spine, were obtained without  intravenous contrast.  Comparison:  Lumbar MRI 08/01/2004  MRI CERVICAL SPINE  Findings:  Normal cervical alignment.  Negative for fracture or mass.  Cervical cord has normal signal and morphology.  Negative for Chiari malformation  C2-3:  Mild disc degeneration.  C3-4:  Disc degeneration and mild spurring.  Mild foraminal narrowing on the right and moderate foraminal narrowing on the left.  Mild central canal stenosis.  C4-5:  Disc degeneration and moderate spondylosis causing mild spinal stenosis and moderate foraminal narrowing bilaterally. There is mild flattening of the cord due to spurring  C5-6:  Moderate spondylosis with flattening of the cord and mild to moderate spinal stenosis.  Moderate foraminal narrowing bilaterally due to spurring.  C6-7:  Mild to moderate spondylosis.  Right foraminal narrowing due to spurring.  C7-T1:  Negative  IMPRESSION: Cervical disc degeneration and spondylosis as above.  No acute disc protrusion.  MRI LUMBAR SPINE  Findings: 9 mm anterior slip L4 on L5 has progressed since the prior study.  No fracture is identified.  No mass lesion.  Conus medullaris is normal and terminates at L1-2.  T12-L1:  Negative  L1-2:  Small right-sided disc protrusion with disc bulging.  Mild right foraminal narrowing.  L2-3:  Diffuse disc bulging with mild spurring.  Mild facet degeneration and mild spinal stenosis.  Mild left foraminal narrowing.  L3-4:  Disc degeneration with diffuse disc bulging.  Moderate facet degeneration with mild to moderate spinal stenosis.  L4-5:  9 mm anterior slip.  This is associated  with severe facet degeneration and disc degeneration.  Interval decompressive laminectomy since the prior MRI in 2005.  There is lateral recess encroachment bilaterally.  Mild central canal stenosis.  L5-S1:  Disc degeneration and spondylosis.  Bilateral facet hypertrophy with moderate foraminal narrowing bilaterally.  1 cm synovial cyst on the left projects posterior to the L5-S1 facet  joint.  This was not present previously.  IMPRESSION: Mild spinal stenosis at L2-3 and mild to moderate stenosis at L2-3 due to disc and facet degeneration.  9 mm of slip of L4-L5 has progressed since 2005.  There has been interval posterior decompression at  this level. There is mild spinal stenosis however there is significant lateral recess encroachment especially on the right.  Foraminal encroachment bilaterally L5-S1 due to spondylosis and facet degeneration,  unchanged.  Original Report Authenticated By: Camelia Phenes, M.D.   Mr Lumbar Spine Wo Contrast  08/31/2011  *RADIOLOGY REPORT*  Clinical Data:  Back pain and spasms.  Neck pain  MRI CERVICAL AND LUMBAR SPINE WITHOUT CONTRAST  Technique:  Multiplanar and multiecho pulse sequences of the cervical spine, to include the craniocervical junction and cervicothoracic junction, and lumbar spine, were obtained without intravenous contrast.  Comparison:  Lumbar MRI 08/01/2004  MRI CERVICAL SPINE  Findings:  Normal cervical alignment.  Negative for fracture or mass.  Cervical cord has normal signal and morphology.  Negative for Chiari malformation  C2-3:  Mild disc degeneration.  C3-4:  Disc degeneration and mild spurring.  Mild foraminal narrowing on the right and moderate foraminal narrowing on the left.  Mild central canal stenosis.  C4-5:  Disc degeneration and moderate spondylosis causing mild spinal stenosis and moderate foraminal narrowing bilaterally. There is mild flattening of the cord due to spurring  C5-6:  Moderate spondylosis with flattening of the cord and mild to moderate spinal stenosis.  Moderate foraminal narrowing bilaterally due to spurring.  C6-7:  Mild to moderate spondylosis.  Right foraminal narrowing due to spurring.  C7-T1:  Negative  IMPRESSION: Cervical disc degeneration and spondylosis as above.  No acute disc protrusion.  MRI LUMBAR SPINE  Findings: 9 mm anterior slip L4 on L5 has progressed since the prior study.  No fracture is  identified.  No mass lesion.  Conus medullaris is normal and terminates at L1-2.  T12-L1:  Negative  L1-2:  Small right-sided disc protrusion with disc bulging.  Mild right foraminal narrowing.  L2-3:  Diffuse disc bulging with mild spurring.  Mild facet degeneration and mild spinal stenosis.  Mild left foraminal narrowing.  L3-4:  Disc degeneration with diffuse disc bulging.  Moderate facet degeneration with mild to moderate spinal stenosis.  L4-5:  9 mm anterior slip.  This is associated  with severe facet degeneration and disc degeneration.  Interval decompressive laminectomy since the prior MRI in 2005.  There is lateral recess encroachment bilaterally.  Mild central canal stenosis.  L5-S1:  Disc degeneration and spondylosis.  Bilateral facet hypertrophy with moderate foraminal narrowing bilaterally.  1 cm synovial cyst on the left projects posterior to the L5-S1 facet joint.  This was not present previously.  IMPRESSION:  Mild spinal stenosis at L2-3 and mild to moderate stenosis at L2-3 due to disc and facet degeneration.  9 mm of slip of L4-L5 has progressed since 2005.  There has been interval posterior decompression at  this level. There is mild spinal stenosis however there is significant lateral recess encroachment especially on the right.  Foraminal encroachment bilaterally L5-S1 due to spondylosis and facet degeneration,  unchanged.  Original Report Authenticated By: Camelia Phenes, M.D.   Dg Hand Complete Left  09/01/2011  *RADIOLOGY REPORT*  Clinical Data: Bilateral interphalangeal joint swelling  LEFT HAND - COMPLETE 3+ VIEW  Comparison: None.  Findings: Severe osteoarthritis and spurring of the first through the fifth interphalangeal DIP joints.  Moderate osteoarthritis in the PIP joints.  Joint space widening   of the first metacarpal phalangeal joint due to ligamentous injury or laxity.  Degenerative changes of the base of the thumb with spurring and bone fragmentation.  Calcification adjacent  to the ulnar styloid may be due to old fracture or chondrocalcinosis.  Negative for fracture.  No erosion.  IMPRESSION: Advanced osteoarthritis without fracture.  Original Report Authenticated By: Camelia Phenes, M.D.   Dg Hand Complete Right  09/01/2011  *RADIOLOGY REPORT*  Clinical Data: Bilateral interphalangeal joint swelling  RIGHT HAND - COMPLETE 3+ VIEW  Comparison: None.  Findings: Fusion of the first metacarpal phalangeal joint with a threaded screw and pin.  Advanced osteoarthritis and spurring of the first interphalangeal joint.  Prosthesis is present in the second PIP joint.  There is osteoarthritis with spurring of the DIP joints as well as the fifth PIP joint.  There is degenerative change at the base of the thumb. No erosions.   Negative for acute fracture.  Possible chronic fracture of the ulnar styloid.  Soft tissue calcifications medial to the triquetrum may be due to old injury.  IMPRESSION: Advanced osteoarthritis.  Negative for fracture.  Original Report Authenticated By: Camelia Phenes, M.D.     PERTINENT LAB RESULTS: CBC:  Basename 09/03/11 0556 09/02/11 0740  WBC 18.7* 19.8*  HGB 11.0* 11.3*  HCT 32.3* 32.8*  PLT 764* 736*   CMET CMP     Component Value Date/Time   NA 135 09/03/2011 0556   K 4.0 09/03/2011 0556   CL 102 09/03/2011 0556   CO2 25 09/03/2011 0556   GLUCOSE 101* 09/03/2011 0556   BUN 14 09/03/2011 0556   CREATININE 0.71 09/03/2011 0556   CALCIUM 8.4 09/03/2011 0556   PROT 5.2* 09/01/2011 0645   ALBUMIN 1.4* 09/01/2011 0645   AST 19 09/01/2011 0645   ALT 15 09/01/2011 0645   ALKPHOS 137* 09/01/2011 0645   BILITOT 0.2* 09/01/2011 0645   GFRNONAA 82* 09/03/2011 0556   GFRAA >90 09/03/2011 0556    GFR Estimated Creatinine Clearance: 49.5 ml/min (by C-G formula based on Cr of 0.71). No results found for this basename: LIPASE:2,AMYLASE:2 in the last 72 hours  Basename 08/31/11 1615 08/31/11 1300  CKTOTAL 108 83  CKMB 4.0 4.4*  CKMBINDEX --  --  TROPONINI 0.51* 0.60*   No results found for this basename: POCBNP:3 in the last 72 hours No results found for this basename: DDIMER:2 in the last 72 hours No results found for this basename: HGBA1C:2 in the last 72 hours No results found for this basename: CHOL:2,HDL:2,LDLCALC:2,TRIG:2,CHOLHDL:2,LDLDIRECT:2 in the last 72 hours No results found for this basename: TSH,T4TOTAL,FREET3,T3FREE,THYROIDAB in the last 72 hours No results found for this basename: VITAMINB12:2,FOLATE:2,FERRITIN:2,TIBC:2,IRON:2,RETICCTPCT:2 in the last 72 hours Coags: No  results found for this basename: PT:2,INR:2 in the last 72 hours Microbiology: Recent Results (from the past 240 hour(s))  CULTURE, BLOOD (ROUTINE X 2)     Status: Normal   Collection Time   08/28/11  3:17 PM      Component Value Range Status Comment   Specimen Description BLOOD LEFT ARM   Final    Special Requests BOTTLES DRAWN AEROBIC AND ANAEROBIC 10CC   Final    Setup Time [id number]   Final    Culture NO GROWTH 5 DAYS   Final    Report Status 09/03/2011 FINAL   Final   CULTURE, BLOOD (ROUTINE X 2)     Status: Normal   Collection Time   08/28/11  3:24 PM      Component Value Range Status Comment   Specimen Description BLOOD LEFT WRIST   Final    Special Requests BOTTLES DRAWN AEROBIC ONLY [facility]   Final    Setup Time [id number]   Final    Culture NO GROWTH 5 DAYS   Final    Report Status 09/03/2011 FINAL   Final   CLOSTRIDIUM DIFFICILE BY PCR     Status: Normal   Collection Time   08/29/11  7:05 PM      Component Value Range Status Comment   C difficile by pcr NEGATIVE  NEGATIVE  Final   STOOL CULTURE     Status: Normal   Collection Time   08/29/11  7:05 PM      Component Value Range Status Comment   Specimen Description STOOL   Final    Special Requests Normal   Final    Culture     Final    Value: NO SALMONELLA, SHIGELLA, CAMPYLOBACTER, OR YERSINIA ISOLATED   Report Status 09/02/2011 FINAL   Final   CULTURE, BLOOD  (ROUTINE X 2)     Status: Normal (Preliminary result)   Collection Time   09/01/11  4:30 PM      Component Value Range Status Comment   Specimen Description BLOOD RIGHT ARM   Final    Special Requests BOTTLES DRAWN AEROBIC AND ANAEROBIC 10CC   Final    Setup Time [id number]   Final    Culture     Final    Value:        BLOOD CULTURE RECEIVED NO GROWTH TO DATE CULTURE WILL BE HELD FOR 5 DAYS BEFORE ISSUING A FINAL NEGATIVE REPORT   Report Status PENDING   Incomplete   CULTURE, BLOOD (ROUTINE X 2)     Status: Normal (Preliminary result)   Collection Time   09/01/11  4:35 PM      Component Value Range Status Comment   Specimen Description BLOOD RIGHT HAND   Final    Special Requests BOTTLES DRAWN AEROBIC ONLY 10CC   Final    Setup Time [id number]   Final    Culture     Final    Value:        BLOOD CULTURE RECEIVED NO GROWTH TO DATE CULTURE WILL BE HELD FOR 5 DAYS BEFORE ISSUING A FINAL NEGATIVE REPORT   Report Status PENDING   Incomplete      BRIEF HOSPITAL COURSE:  Principal Problem:  Salmonella enteritis resolved.  Muscle spasm of back/ neck pain: PT recommended home health PT  Dehydration  resolved  ARF (acute renal failure) ; renal failure resolved Reactive arthritis currently on prednisone taper   Paroxysmal Supraventricular tachycardia resolved. outpt follow up as needed.  TODAY-DAY OF DISCHARGE:  Subjective:   [name] today has no headache,no chest abdominal pain,no new weakness tingling or numbness, feels much better wants to go home today.   Objective:   Blood pressure 119/71, pulse 79, temperature 97.5 F (36.4 C), temperature source Oral, resp. rate 18, height 5\' 3"  (1.6 m), weight 55.7 kg (122 lb 12.7 oz), SpO2 95.00%.  Intake/Output Summary (Last 24 hours) at 09/03/11 1129 Last data filed at 09/03/11 0900  Gross per 24 hour  Intake   1880 ml  Output    801 ml  Net   1079 ml    Exam Awake Alert, Oriented 3, No new F.N deficits, Normal  affect [place].AT,PERRAL Supple Neck,No JVD, No cervical lymphadenopathy appriciated.  Symmetrical Chest wall movement, Good air movement bilaterally, CTAB RRR,No Gallops,Rubs or new Murmurs, No Parasternal Heave +ve B.Sounds, Abd Soft, Non tender, No organomegaly appriciated, No rebound -guarding or rigidity. No Cyanosis, Clubbing or bilateral hand and joint swelling improved. , No new Rash or bruise  DISPOSITION: Discharge Home.   DISCHARGE INSTRUCTIONS:    Follow-up Information    Follow up with EHINGER,ROBERT R in 2 weeks. (get a CXR  AT PCP OFFICE)    Contact information:   [address] [employer], [place].  [place] [address] [phone]       Follow up with HAWKES,ANGELA D in 1 week.   Contact information:   [address] [place] [address] [phone]           Total Time spent on discharge equals 45 minutes.  SignedKathlen Mody 09/03/2011 11:29 AM

## 2011-09-04 LAB — ANTI-SCLERODERMA ANTIBODY: Scleroderma (Scl-70) (ENA) Antibody, IgG: 2 AU/mL (ref <30)

## 2011-09-07 LAB — CULTURE, BLOOD (ROUTINE X 2)
Culture  Setup Time: 201211162123
Culture: NO GROWTH

## 2011-09-15 ENCOUNTER — Telehealth: Payer: Self-pay | Admitting: *Deleted

## 2011-09-15 NOTE — Telephone Encounter (Signed)
PATIENT CONFIRMED OVER THE PHONE APPOINTMENT FOR 09-22-2011 STARTING AT 1:00

## 2011-09-18 ENCOUNTER — Other Ambulatory Visit: Payer: Self-pay | Admitting: Family Medicine

## 2011-09-18 ENCOUNTER — Ambulatory Visit
Admission: RE | Admit: 2011-09-18 | Discharge: 2011-09-18 | Disposition: A | Discharge status: Home / Self Care | Payer: Medicare Other | Source: Ambulatory Visit | Attending: Family Medicine | Admitting: Family Medicine

## 2011-09-18 DIAGNOSIS — J189 Pneumonia, unspecified organism: Secondary | ICD-10-CM

## 2011-09-19 ENCOUNTER — Ambulatory Visit: Payer: Medicare Other | Admitting: Oncology

## 2011-09-20 ENCOUNTER — Telehealth: Payer: Self-pay | Admitting: Oncology

## 2011-09-20 NOTE — Telephone Encounter (Signed)
per Dr. Welton Flakes called pt and r/s appts on 12/07 to 12/11

## 2011-09-22 ENCOUNTER — Ambulatory Visit: Payer: Medicare Other

## 2011-09-22 ENCOUNTER — Ambulatory Visit: Payer: Medicare Other | Admitting: Oncology

## 2011-09-22 ENCOUNTER — Other Ambulatory Visit: Payer: Medicare Other | Admitting: Lab

## 2011-09-26 ENCOUNTER — Ambulatory Visit (HOSPITAL_BASED_OUTPATIENT_CLINIC_OR_DEPARTMENT_OTHER): Payer: Medicare Other | Admitting: Oncology

## 2011-09-26 ENCOUNTER — Encounter: Payer: Self-pay | Admitting: Oncology

## 2011-09-26 ENCOUNTER — Ambulatory Visit: Payer: Medicare Other

## 2011-09-26 ENCOUNTER — Other Ambulatory Visit (HOSPITAL_BASED_OUTPATIENT_CLINIC_OR_DEPARTMENT_OTHER): Payer: Medicare Other | Admitting: Lab

## 2011-09-26 ENCOUNTER — Telehealth: Payer: Self-pay | Admitting: Oncology

## 2011-09-26 VITALS — BP 143/70 | HR 96 | Temp 98.3°F | Ht 63.0 in | Wt 104.0 lb

## 2011-09-26 DIAGNOSIS — D473 Essential (hemorrhagic) thrombocythemia: Secondary | ICD-10-CM

## 2011-09-26 DIAGNOSIS — D72829 Elevated white blood cell count, unspecified: Secondary | ICD-10-CM

## 2011-09-26 LAB — CBC WITH DIFFERENTIAL/PLATELET
Basophils Absolute: 0 10*3/uL (ref 0.0–0.1)
Eosinophils Absolute: 0 10*3/uL (ref 0.0–0.5)
HGB: 9.7 g/dL — ABNORMAL LOW (ref 11.6–15.9)
MCV: 95 fL (ref 79.5–101.0)
MONO#: 0.5 10*3/uL (ref 0.1–0.9)
NEUT#: 9.5 10*3/uL — ABNORMAL HIGH (ref 1.5–6.5)
RDW: 13.6 % (ref 11.2–14.5)
WBC: 11 10*3/uL — ABNORMAL HIGH (ref 3.9–10.3)
lymph#: 1 10*3/uL (ref 0.9–3.3)

## 2011-09-26 LAB — IRON AND TIBC

## 2011-09-26 LAB — TECHNOLOGIST REVIEW: Technologist Review: NONE SEEN

## 2011-09-26 LAB — COMPREHENSIVE METABOLIC PANEL
AST: 17 U/L (ref 0–37)
Albumin: 2.3 g/dL — ABNORMAL LOW (ref 3.5–5.2)
Alkaline Phosphatase: 126 U/L — ABNORMAL HIGH (ref 39–117)
Glucose, Bld: 109 mg/dL — ABNORMAL HIGH (ref 70–99)
Potassium: 4.1 mEq/L (ref 3.5–5.3)
Sodium: 134 mEq/L — ABNORMAL LOW (ref 135–145)
Total Protein: 6.8 g/dL (ref 6.0–8.3)

## 2011-09-26 NOTE — Progress Notes (Signed)
[name] [id number] 1933-03-22 77 y.o. 09/26/2011 3:04 PM  CC Dr. Rondall Allegra Dr. Nickola Major  REASON FOR CONSULTATION:  77 year old female with reactive thrombocytosis here for further evaluation for possible primary bone marrow process.   REFERRING PHYSICIAN: Dr. Rondall Allegra  HISTORY OF PRESENT ILLNESS:  [name] is a 77 y.o. female.  With medical history significant for gastroesophageal reflux disease and osteoarthritis. Patient recently in mid November 2012 was giving a dinner party which she became quite sick with diarrhea. She eventually was diagnosed with salmonella enteritis. Thereafter she developed reactive arthritis and has been seeing Dr. Nickola Major. She tells me that she has also been on tapering doses of prednisone off-and-on. Most recently she was seen by Dr. Johna Sheriff did a CBC on her and she was noted to have a platelet count of over 1 million. Because of this she was referred by Dr.Erhinger hematologic evaluation. Graph clinically patient still continues to have significant arthritic type of pain she has swelling of her joints including the hands. He has not noticed any fevers chills night sweats headaches shortness of breath chest pains palpitations she has no myalgias she does have continued arthralgias. She denies having any bleeding problems no hematuria hematochezia melena hemoptysis or hematemesis. She does not have any previous history of hematologic problems.   Past Medical History  Diagnosis Date  . GERD (gastroesophageal reflux disease)   . Rotator cuff tear, left   . Arthritis     "osteoarthritis"  . Shortness of breath 08/28/11    "hard time breathing deeply because of the pain"  . Diarrhea 08/28/11    "for the last 17 days; from samonella poisoning"  . Headache 08/28/11    "just for the last few days; releated to dehydration"    Past Surgical History  Procedure Date  . Back surgery ~ 2004    "stenosis"  . Facial cosmetic surgery ~ 2002   . Finger surgery ~ 2000    "titanium rod in right pointer; from arthritis"  . Tonsillectomy 1943  . Abdominal hysterectomy     "still have my ovaries"  . Tubal ligation 1970's  . Cataract extraction w/ intraocular lens  implant, bilateral Summer 2011    No family history on file.  Social History History  Substance Use Topics  . Smoking status: Former Smoker -- 1.5 packs/day for 30 years    Types: Cigarettes  . Smokeless tobacco: Never Used  . Alcohol Use: 8.4 oz/week    10 Glasses of wine, 4 Shots of liquor per week     "usually wine; sosmetimes vodka; couple drinks q night"    No Known Allergies  Current Outpatient Prescriptions  Medication Sig Dispense Refill  . ACAI PO Take 1 tablet by mouth daily.        . Ascorbic Acid (VITAMIN C) 1000 MG tablet Take 1,000 mg by mouth daily.        Marland Kitchen BIOTIN PO Take 1 tablet by mouth daily.        . calcium citrate-vitamin D 200-200 MG-UNIT TABS Take 1 tablet by mouth daily.        . Coenzyme Q10 (CO Q 10 PO) Take 1 tablet by mouth daily.        Marland Kitchen estradiol (VIVELLE-DOT) 0.0375 MG/24HR Place 1 patch onto the skin 2 (two) times a week. Patient changes patch on Mondays and Fridays       . fish oil-omega-3 fatty acids 1000 MG capsule Take 2 g by mouth daily.        Marland Kitchen  Multiple Vitamins-Minerals (MULTIVITAMINS THER. W/MINERALS) TABS Take 1 tablet by mouth daily.        Marland Kitchen omeprazole (PRILOSEC) 40 MG capsule Take 40 mg by mouth daily.        Marland Kitchen OVER THE COUNTER MEDICATION Take 2 tablets by mouth daily. Osteo biflex       . oxyCODONE-acetaminophen (PERCOCET) 5-325 MG per tablet Take 1 tablet by mouth every 4 (four) hours as needed.        . predniSONE (STERAPRED UNI-PAK) 10 MG tablet Take 10 mg by mouth daily.        Marland Kitchen zolpidem (AMBIEN) 5 MG tablet Take 1 tablet (5 mg total) by mouth at bedtime as needed for sleep (insomnia).  15 tablet  0    REVIEW OF SYSTEMS:  Constitutional: positive for malaise and weight loss Eyes: positive for  irritation Ears, nose, mouth, throat, and face: negative Respiratory: negative Cardiovascular: positive for fatigue Gastrointestinal: positive for abdominal pain, diarrhea and reflux symptoms Genitourinary:negative Hematologic/lymphatic: negative Musculoskeletal:positive for arthralgias, back pain, neck pain and stiff joints Neurological: negative  ECOG performance Status: 1  PHYSICAL EXAMINATION: Blood pressure 143/70, pulse 96, temperature 98.3 F (36.8 C), temperature source Oral, height 5\' 3"  (1.6 m), weight 104 lb (47.174 kg). ZOX:WRUEA, healthy, no distress, well nourished, well developed and pale SKIN: skin color, texture, turgor are normal, no rashes or significant lesions HEAD: Normocephalic, No masses, lesions, tenderness or abnormalities EYES: normal, PERRLA, EOMI, Conjunctiva are pink and non-injected EARS: External ears normal, Canals clear OROPHARYNX:no exudate, no erythema, lips, buccal mucosa, and tongue normal and dentition normal  NECK: supple, no adenopathy, no bruits, no JVD, thyroid normal size, non-tender, without nodularity LYMPH:  no palpable lymphadenopathy, no hepatosplenomegaly, few small anterior cervical nodes BREAST:not examined LUNGS: clear to auscultation , and palpation, clear to auscultation and percussion HEART: regular rate & rhythm, no murmurs, no gallops, S1 normal and S2 normal ABDOMEN:abdomen soft, non-tender, normal bowel sounds and no masses or organomegaly BACK: Back symmetric, no curvature., No CVA tenderness, Range of motion is normal, Mild to moderate spasm of paralumbar muscles EXTREMITIES:less then 2 second capillary refill, positive findings:  Heberden's nodes present on DIP's of hands, tenderness and swelling of PIP and MCP joints and ulnar deviation  NEURO: alert & oriented x 3 with fluent speech, no focal motor/sensory deficits, gait normal, reflexes normal and symmetric    STUDIES/RESULTS: Dg Chest 2 View  09/18/2011  *RADIOLOGY  REPORT*  Clinical Data: History pneumonia, follow  CHEST - 2 VIEW  Comparison: Lower chest x-ray of 09/01/2011  Findings: The opacity noted previously at the left lung base has cleared, and the small effusions have resolved.  No active infiltrate or effusion is seen.  Mild cardiomegaly is stable.  The lungs remain slightly hyperaerated.  No bony abnormality is seen.  IMPRESSION: Clearing of left lower lobe opacity and small effusions.  No active process.  Original Report Authenticated By: Juline Patch, M.D.   Dg Chest 2 View  09/01/2011  *RADIOLOGY REPORT*  Clinical Data: New onset of high grade fever  CHEST - 2 VIEW  Comparison: None.  Findings: There is parenchymal opacity in the left lower lobe with small effusions most consistent with left lower lobe pneumonia. The heart is mildly enlarged.  No bony abnormality is seen.  IMPRESSION: Left lower lobe pneumonia and small effusions.  Original Report Authenticated By: Juline Patch, M.D.   Dg Cervical Spine 2-3 Views  08/28/2011  *RADIOLOGY REPORT*  Clinical Data: Posterior  neck pain.  Clinical diagnosis of neck spasms.  CERVICAL SPINE - 2-3 VIEW  Comparison: None.  Findings: AP and lateral views of the cervical spine demonstrate reversal of the normal cervical lordosis.  Multilevel degenerative changes are noted as well as levoconvex cervicothoracic scoliosis.  IMPRESSION: Multilevel degenerative changes and reversal of the normal cervical lordosis.  Original Report Authenticated By: Darrol Angel, M.D.   Mr Cervical Spine Wo Contrast  08/31/2011  *RADIOLOGY REPORT*  Clinical Data:  Back pain and spasms.  Neck pain  MRI CERVICAL AND LUMBAR SPINE WITHOUT CONTRAST  Technique:  Multiplanar and multiecho pulse sequences of the cervical spine, to include the craniocervical junction and cervicothoracic junction, and lumbar spine, were obtained without intravenous contrast.  Comparison:  Lumbar MRI 08/01/2004  MRI CERVICAL SPINE  Findings:  Normal cervical  alignment.  Negative for fracture or mass.  Cervical cord has normal signal and morphology.  Negative for Chiari malformation  C2-3:  Mild disc degeneration.  C3-4:  Disc degeneration and mild spurring.  Mild foraminal narrowing on the right and moderate foraminal narrowing on the left.  Mild central canal stenosis.  C4-5:  Disc degeneration and moderate spondylosis causing mild spinal stenosis and moderate foraminal narrowing bilaterally. There is mild flattening of the cord due to spurring  C5-6:  Moderate spondylosis with flattening of the cord and mild to moderate spinal stenosis.  Moderate foraminal narrowing bilaterally due to spurring.  C6-7:  Mild to moderate spondylosis.  Right foraminal narrowing due to spurring.  C7-T1:  Negative  IMPRESSION: Cervical disc degeneration and spondylosis as above.  No acute disc protrusion.  MRI LUMBAR SPINE  Findings: 9 mm anterior slip L4 on L5 has progressed since the prior study.  No fracture is identified.  No mass lesion.  Conus medullaris is normal and terminates at L1-2.  T12-L1:  Negative  L1-2:  Small right-sided disc protrusion with disc bulging.  Mild right foraminal narrowing.  L2-3:  Diffuse disc bulging with mild spurring.  Mild facet degeneration and mild spinal stenosis.  Mild left foraminal narrowing.  L3-4:  Disc degeneration with diffuse disc bulging.  Moderate facet degeneration with mild to moderate spinal stenosis.  L4-5:  9 mm anterior slip.  This is associated  with severe facet degeneration and disc degeneration.  Interval decompressive laminectomy since the prior MRI in 2005.  There is lateral recess encroachment bilaterally.  Mild central canal stenosis.  L5-S1:  Disc degeneration and spondylosis.  Bilateral facet hypertrophy with moderate foraminal narrowing bilaterally.  1 cm synovial cyst on the left projects posterior to the L5-S1 facet joint.  This was not present previously.  IMPRESSION: Mild spinal stenosis at L2-3 and mild to moderate  stenosis at L2-3 due to disc and facet degeneration.  9 mm of slip of L4-L5 has progressed since 2005.  There has been interval posterior decompression at  this level. There is mild spinal stenosis however there is significant lateral recess encroachment especially on the right.  Foraminal encroachment bilaterally L5-S1 due to spondylosis and facet degeneration,  unchanged.  Original Report Authenticated By: Camelia Phenes, M.D.   Mr Lumbar Spine Wo Contrast  08/31/2011  *RADIOLOGY REPORT*  Clinical Data:  Back pain and spasms.  Neck pain  MRI CERVICAL AND LUMBAR SPINE WITHOUT CONTRAST  Technique:  Multiplanar and multiecho pulse sequences of the cervical spine, to include the craniocervical junction and cervicothoracic junction, and lumbar spine, were obtained without intravenous contrast.  Comparison:  Lumbar MRI 08/01/2004  MRI CERVICAL SPINE  Findings:  Normal cervical alignment.  Negative for fracture or mass.  Cervical cord has normal signal and morphology.  Negative for Chiari malformation  C2-3:  Mild disc degeneration.  C3-4:  Disc degeneration and mild spurring.  Mild foraminal narrowing on the right and moderate foraminal narrowing on the left.  Mild central canal stenosis.  C4-5:  Disc degeneration and moderate spondylosis causing mild spinal stenosis and moderate foraminal narrowing bilaterally. There is mild flattening of the cord due to spurring  C5-6:  Moderate spondylosis with flattening of the cord and mild to moderate spinal stenosis.  Moderate foraminal narrowing bilaterally due to spurring.  C6-7:  Mild to moderate spondylosis.  Right foraminal narrowing due to spurring.  C7-T1:  Negative  IMPRESSION: Cervical disc degeneration and spondylosis as above.  No acute disc protrusion.  MRI LUMBAR SPINE  Findings: 9 mm anterior slip L4 on L5 has progressed since the prior study.  No fracture is identified.  No mass lesion.  Conus medullaris is normal and terminates at L1-2.  T12-L1:  Negative   L1-2:  Small right-sided disc protrusion with disc bulging.  Mild right foraminal narrowing.  L2-3:  Diffuse disc bulging with mild spurring.  Mild facet degeneration and mild spinal stenosis.  Mild left foraminal narrowing.  L3-4:  Disc degeneration with diffuse disc bulging.  Moderate facet degeneration with mild to moderate spinal stenosis.  L4-5:  9 mm anterior slip.  This is associated  with severe facet degeneration and disc degeneration.  Interval decompressive laminectomy since the prior MRI in 2005.  There is lateral recess encroachment bilaterally.  Mild central canal stenosis.  L5-S1:  Disc degeneration and spondylosis.  Bilateral facet hypertrophy with moderate foraminal narrowing bilaterally.  1 cm synovial cyst on the left projects posterior to the L5-S1 facet joint.  This was not present previously.  IMPRESSION: Mild spinal stenosis at L2-3 and mild to moderate stenosis at L2-3 due to disc and facet degeneration.  9 mm of slip of L4-L5 has progressed since 2005.  There has been interval posterior decompression at  this level. There is mild spinal stenosis however there is significant lateral recess encroachment especially on the right.  Foraminal encroachment bilaterally L5-S1 due to spondylosis and facet degeneration,  unchanged.  Original Report Authenticated By: Camelia Phenes, M.D.   Dg Hand Complete Left  09/01/2011  *RADIOLOGY REPORT*  Clinical Data: Bilateral interphalangeal joint swelling  LEFT HAND - COMPLETE 3+ VIEW  Comparison: None.  Findings: Severe osteoarthritis and spurring of the first through the fifth interphalangeal DIP joints.  Moderate osteoarthritis in the PIP joints.  Joint space widening   of the first metacarpal phalangeal joint due to ligamentous injury or laxity.  Degenerative changes of the base of the thumb with spurring and bone fragmentation.  Calcification adjacent to the ulnar styloid may be due to old fracture or chondrocalcinosis.  Negative for fracture.  No  erosion.  IMPRESSION: Advanced osteoarthritis without fracture.  Original Report Authenticated By: Camelia Phenes, M.D.   Dg Hand Complete Right  09/01/2011  *RADIOLOGY REPORT*  Clinical Data: Bilateral interphalangeal joint swelling  RIGHT HAND - COMPLETE 3+ VIEW  Comparison: None.  Findings: Fusion of the first metacarpal phalangeal joint with a threaded screw and pin.  Advanced osteoarthritis and spurring of the first interphalangeal joint.  Prosthesis is present in the second PIP joint.  There is osteoarthritis with spurring of the DIP joints as well as the fifth PIP joint.  There is degenerative change at the base of the thumb. No erosions.   Negative for acute fracture.  Possible chronic fracture of the ulnar styloid.  Soft tissue calcifications medial to the triquetrum may be due to old injury.  IMPRESSION: Advanced osteoarthritis.  Negative for fracture.  Original Report Authenticated By: Camelia Phenes, M.D.     ASSESSMENT    77 year old female with thrombocytosis with platelet count over 1 million most likely this is reactive. However a primary bone marrow problem does need to be ruled out. I have discussed the differential diagnosis with the patient and her husband. The differential does include reactive thrombocytosis versus essential thrombocytosis iron deficiency or even a chronic myeloproliferative disorder.    PLAN:    We will proceed with getting a CBC C. matte LDH as well as iron studies. Will also send off a leukocyte alkaline phosphatase score. And we will do a BCR/ABL analysis. I will see her back in about a month's time when I have the results of these studies. If none of these studies reveal the cause or source then we will plan on doing a bone marrow biopsy and aspirate.      All questions were answered. The patient knows to call the clinic with any problems, questions or concerns. We can certainly see the patient much sooner if necessary.  Thank you so much for allowing  me to participate in the care of [facility]. I will continue to follow up the patient with you and assist in her care.  I spent 40 minutes counseling the patient face to face. The total time spent in the appointment was 60 minutes. Drue Second, MD Medical Oncology/Hematology [facility] [phone] (beeper) [phone] (Office)  09/26/2011, 3:04 PM 09/26/2011, 3:04 PM

## 2011-09-26 NOTE — Telephone Encounter (Signed)
[facility] pt appt schedule for jan and sent pt back to lab per KK

## 2011-10-16 ENCOUNTER — Other Ambulatory Visit: Payer: Medicare Other | Admitting: Lab

## 2011-10-23 ENCOUNTER — Ambulatory Visit (HOSPITAL_BASED_OUTPATIENT_CLINIC_OR_DEPARTMENT_OTHER): Payer: Medicare Other | Admitting: Oncology

## 2011-10-23 ENCOUNTER — Ambulatory Visit (HOSPITAL_BASED_OUTPATIENT_CLINIC_OR_DEPARTMENT_OTHER): Payer: Medicare Other

## 2011-10-23 ENCOUNTER — Telehealth: Payer: Self-pay | Admitting: Oncology

## 2011-10-23 DIAGNOSIS — D649 Anemia, unspecified: Secondary | ICD-10-CM

## 2011-10-23 DIAGNOSIS — D473 Essential (hemorrhagic) thrombocythemia: Secondary | ICD-10-CM

## 2011-10-23 LAB — CBC WITH DIFFERENTIAL/PLATELET
Basophils Absolute: 0 10*3/uL (ref 0.0–0.1)
EOS%: 0.2 % (ref 0.0–7.0)
HCT: 35.2 % (ref 34.8–46.6)
HGB: 11.9 g/dL (ref 11.6–15.9)
LYMPH%: 9.5 % — ABNORMAL LOW (ref 14.0–49.7)
MCH: 31.1 pg (ref 25.1–34.0)
MCV: 92.3 fL (ref 79.5–101.0)
MONO%: 3.2 % (ref 0.0–14.0)
NEUT%: 86.8 % — ABNORMAL HIGH (ref 38.4–76.8)

## 2011-10-23 NOTE — Telephone Encounter (Signed)
[facility] PT APPT SCHEDULE FOR April.

## 2011-10-23 NOTE — Progress Notes (Signed)
OFFICE PROGRESS NOTE  CC  [name], MD, MD [address] [place] [address]  DIAGNOSIS: 78 year old female with reactive thrombocytosis  PRIOR THERAPY:observation  CURRENT THERAPY:observation  INTERVAL HISTORY: [name] 78 y.o. female returns for followup visit today. On her last visit which was her initial visit on 09/26/2011 patient was noted to have an elevated white count as well as platelet count. We did a quantitative RT PCR for BCR able. This result has come back as negative. Patient clinically today tells me that she is doing much better. She still remains on steroids do to her underlying inflammatory process. She otherwise feels well she has no fevers chills night sweats headaches. She is still being seen by Dr. Johna Sheriff for of the reactive arthritis. This was secondary to the Salmonella infection that she experienced at a dinner party. Today she has no nausea vomiting fevers chills night sweats headaches shortness of breath chest pains palpitations. Remainder of the review of systems is negative.  MEDICAL HISTORY: Past Medical History  Diagnosis Date  . GERD (gastroesophageal reflux disease)   . Rotator cuff tear, left   . Arthritis     "osteoarthritis"  . Shortness of breath 08/28/11    "hard time breathing deeply because of the pain"  . Diarrhea 08/28/11    "for the last 17 days; from samonella poisoning"  . Headache 08/28/11    "just for the last few days; releated to dehydration"    ALLERGIES:   has no known allergies.  MEDICATIONS:  Current Outpatient Prescriptions  Medication Sig Dispense Refill  . ACAI PO Take 1 tablet by mouth daily.        . Ascorbic Acid (VITAMIN C) 1000 MG tablet Take 1,000 mg by mouth daily.        Marland Kitchen BIOTIN PO Take 1 tablet by mouth daily.        . calcium citrate-vitamin D 200-200 MG-UNIT TABS Take 1 tablet by mouth daily.        . Coenzyme Q10 (CO Q 10 PO) Take 1 tablet by mouth daily.        Marland Kitchen estradiol  (VIVELLE-DOT) 0.0375 MG/24HR Place 1 patch onto the skin 2 (two) times a week. Patient changes patch on Mondays and Fridays       . fish oil-omega-3 fatty acids 1000 MG capsule Take 2 g by mouth daily.        . Multiple Vitamins-Minerals (MULTIVITAMINS THER. W/MINERALS) TABS Take 1 tablet by mouth daily.        Marland Kitchen omeprazole (PRILOSEC) 40 MG capsule Take 40 mg by mouth daily.        Marland Kitchen OVER THE COUNTER MEDICATION Take 2 tablets by mouth daily. Osteo biflex       . oxyCODONE-acetaminophen (PERCOCET) 5-325 MG per tablet Take 1 tablet by mouth every 4 (four) hours as needed.        . predniSONE (STERAPRED UNI-PAK) 10 MG tablet Take 10 mg by mouth daily.        Marland Kitchen zolpidem (AMBIEN) 5 MG tablet Take 1 tablet (5 mg total) by mouth at bedtime as needed for sleep (insomnia).  15 tablet  0    SURGICAL HISTORY:  Past Surgical History  Procedure Date  . Back surgery ~ 2004    "stenosis"  . Facial cosmetic surgery ~ 2002  . Finger surgery ~ 2000    "titanium rod in right pointer; from arthritis"  . Tonsillectomy 1943  . Abdominal hysterectomy     "  still have my ovaries"  . Tubal ligation 1970's  . Cataract extraction w/ intraocular lens  implant, bilateral Summer 2011    REVIEW OF SYSTEMS:  Pertinent items are noted in HPI.   PHYSICAL EXAMINATION: General appearance: alert, cooperative and appears stated age  ECOG PERFORMANCE STATUS: 0 - Asymptomatic  Blood pressure 155/71, pulse 76, temperature 97.7 F (36.5 C), height 5\' 3"  (1.6 m), weight 99 lb 14.4 oz (45.314 kg).  LABORATORY DATA: Lab Results  Component Value Date   WBC 13.2* 10/23/2011   HGB 11.9 10/23/2011   HCT 35.2 10/23/2011   MCV 92.3 10/23/2011   PLT 655* 10/23/2011      Chemistry      Component Value Date/Time   NA 134* 09/26/2011 1533   K 4.1 09/26/2011 1533   CL 99 09/26/2011 1533   CO2 27 09/26/2011 1533   BUN 15 09/26/2011 1533   CREATININE 0.64 09/26/2011 1533      Component Value Date/Time   CALCIUM 9.3 09/26/2011  1533   ALKPHOS 126* 09/26/2011 1533   AST 17 09/26/2011 1533   ALT 27 09/26/2011 1533   BILITOT 0.2* 09/26/2011 1533       RADIOGRAPHIC STUDIES:  No results found.  ASSESSMENT: 78 year old female with reactive thrombocytosis. Today her platelet count is down even further to the 600 range. I do believe that this was all a reactive process. Patient remains on corticosteroids at this time for her reactive arthritis. Of note I had done a iron studies on her and she is noted to be to have low total iron. I have recommended oral iron with vitamin C to see if we can replenish her iron stores.she tells me that she has been eating foods rich in iron and in fact her hemoglobin has gone up to 11.9 from 9.7 and this is a great response. Platelets are down to 655,000 and the white count is slightly up to 13.2 but again this may all be reactive. Patient remains on quite a dose of steroids and this may be a reaction to the steroids.   PLAN: at this time a recommendation is observation. She will continue I. Oral iron and I will see her back in 3 months time at which time we will do iron studies again for her. In the meantime she knows to call me with any problems questions or concerns.   All questions were answered. The patient knows to call the clinic with any problems, questions or concerns. We can certainly see the patient much sooner if necessary.  I spent 25 minutes counseling the patient face to face. The total time spent in the appointment was 30 minutes.    Drue Second, MD Medical/Oncology [facility] [phone] (beeper) [phone] (Office)  10/23/2011, 3:10 PM

## 2011-10-24 ENCOUNTER — Telehealth: Payer: Self-pay | Admitting: *Deleted

## 2011-10-24 NOTE — Telephone Encounter (Signed)
Message copied by GARNER, Gerald Leitz on Tue Oct 24, 2011 10:02 AM ------      Message from: Victorino December      Created: Mon Oct 23, 2011  3:16 PM       Call patient: let pt know platelets down to 655,000 from 849,000 This is all good!!!

## 2011-10-24 NOTE — Telephone Encounter (Signed)
Pt notified platelets are down to 655,000 from 849,000.

## 2012-01-29 ENCOUNTER — Telehealth: Payer: Self-pay | Admitting: Oncology

## 2012-01-29 ENCOUNTER — Encounter: Payer: Self-pay | Admitting: Oncology

## 2012-01-29 ENCOUNTER — Telehealth: Payer: Self-pay | Admitting: *Deleted

## 2012-01-29 ENCOUNTER — Other Ambulatory Visit (HOSPITAL_BASED_OUTPATIENT_CLINIC_OR_DEPARTMENT_OTHER): Payer: Medicare Other | Admitting: Lab

## 2012-01-29 ENCOUNTER — Ambulatory Visit (HOSPITAL_BASED_OUTPATIENT_CLINIC_OR_DEPARTMENT_OTHER): Payer: Medicare Other | Admitting: Oncology

## 2012-01-29 VITALS — BP 143/74 | HR 63 | Temp 97.8°F | Ht 63.0 in | Wt 106.5 lb

## 2012-01-29 DIAGNOSIS — Z862 Personal history of diseases of the blood and blood-forming organs and certain disorders involving the immune mechanism: Secondary | ICD-10-CM | POA: Insufficient documentation

## 2012-01-29 DIAGNOSIS — D649 Anemia, unspecified: Secondary | ICD-10-CM

## 2012-01-29 LAB — FERRITIN: Ferritin: 132 ng/mL (ref 10–291)

## 2012-01-29 LAB — CBC WITH DIFFERENTIAL/PLATELET
EOS%: 1.1 % (ref 0.0–7.0)
MCH: 31 pg (ref 25.1–34.0)
MCV: 91.5 fL (ref 79.5–101.0)
MONO%: 11.3 % (ref 0.0–14.0)
RBC: 4.4 10*6/uL (ref 3.70–5.45)
RDW: 15.5 % — ABNORMAL HIGH (ref 11.2–14.5)
nRBC: 0 % (ref 0–0)

## 2012-01-29 LAB — IRON AND TIBC: %SAT: 18 % — ABNORMAL LOW (ref 20–55)

## 2012-01-29 NOTE — Progress Notes (Signed)
OFFICE PROGRESS NOTE  CC  [name], MD, MD [address] [place] [address]  DIAGNOSIS: 78 year old female with reactive thrombocytosis resolved  PRIOR THERAPY:observation  CURRENT THERAPY:observation  INTERVAL HISTORY: [name] 78 y.o. female returns for followup visit today. Clinically she seems to be doing well. She still remains on prednisone as well as sulfasalazine. Her platelet count today is normal at 358,000. She otherwise denies any headaches double vision blurring of vision no fevers chills or night sweats. Remainder of the 10 point review of systems is negative from hematologic perspective.   MEDICAL HISTORY: Past Medical History  Diagnosis Date  . GERD (gastroesophageal reflux disease)   . Rotator cuff tear, left   . Arthritis     "osteoarthritis"  . Shortness of breath 08/28/11    "hard time breathing deeply because of the pain"  . Diarrhea 08/28/11    "for the last 17 days; from samonella poisoning"  . Headache 08/28/11    "just for the last few days; releated to dehydration"    ALLERGIES:   has no known allergies.  MEDICATIONS:  Current Outpatient Prescriptions  Medication Sig Dispense Refill  . ACAI PO Take 1 tablet by mouth daily.        Marland Kitchen BIOTIN PO Take 1 tablet by mouth daily.        . calcium citrate-vitamin D 200-200 MG-UNIT TABS Take 1 tablet by mouth daily.        . Coenzyme Q10 (CO Q 10 PO) Take 1 tablet by mouth daily.        Marland Kitchen estradiol (VIVELLE-DOT) 0.0375 MG/24HR Place 1 patch onto the skin 2 (two) times a week. Patient changes patch on Mondays and Fridays       . fish oil-omega-3 fatty acids 1000 MG capsule Take 2 g by mouth daily.        . Melatonin 10 MG TABS Take 10 mg by mouth every other day.      . Multiple Vitamins-Minerals (MULTIVITAMINS THER. W/MINERALS) TABS Take 1 tablet by mouth daily.        Marland Kitchen omeprazole (PRILOSEC) 40 MG capsule Take 40 mg by mouth daily.        Marland Kitchen OVER THE COUNTER MEDICATION Take 2  tablets by mouth daily. Osteo biflex       . predniSONE (STERAPRED UNI-PAK) 10 MG tablet Take 7.5 mg by mouth daily.       Marland Kitchen sulfaSALAzine (AZULFIDINE) 500 MG tablet Take 500 mg by mouth 4 (four) times daily.      Marland Kitchen VALERIAN PO Take 2 capsules by mouth every other day. Opposite the melatonin      . Ascorbic Acid (VITAMIN C) 1000 MG tablet Take 1,000 mg by mouth daily.        Marland Kitchen oxyCODONE-acetaminophen (PERCOCET) 5-325 MG per tablet Take 1 tablet by mouth every 4 (four) hours as needed.        . zolpidem (AMBIEN) 5 MG tablet Take 1 tablet (5 mg total) by mouth at bedtime as needed for sleep (insomnia).  15 tablet  0    SURGICAL HISTORY:  Past Surgical History  Procedure Date  . Back surgery ~ 2004    "stenosis"  . Facial cosmetic surgery ~ 2002  . Finger surgery ~ 2000    "titanium rod in right pointer; from arthritis"  . Tonsillectomy 1943  . Abdominal hysterectomy     "still have my ovaries"  . Tubal ligation 1970's  . Cataract extraction w/  intraocular lens  implant, bilateral Summer 2011    REVIEW OF SYSTEMS:  Pertinent items are noted in HPI.   PHYSICAL EXAMINATION: General appearance: alert, cooperative and appears stated age  ECOG PERFORMANCE STATUS: 0 - Asymptomatic  Blood pressure 143/74, pulse 63, temperature 97.8 F (36.6 C), height 5\' 3"  (1.6 m), weight 106 lb 8 oz (48.308 kg).  LABORATORY DATA: Lab Results  Component Value Date   WBC 5.7 01/29/2012   HGB 13.6 01/29/2012   HCT 40.2 01/29/2012   MCV 91.5 01/29/2012   PLT 358 01/29/2012      Chemistry      Component Value Date/Time   NA 134* 09/26/2011 1533   K 4.1 09/26/2011 1533   CL 99 09/26/2011 1533   CO2 27 09/26/2011 1533   BUN 15 09/26/2011 1533   CREATININE 0.64 09/26/2011 1533      Component Value Date/Time   CALCIUM 9.3 09/26/2011 1533   ALKPHOS 126* 09/26/2011 1533   AST 17 09/26/2011 1533   ALT 27 09/26/2011 1533   BILITOT 0.2* 09/26/2011 1533       RADIOGRAPHIC STUDIES:  No results  found.  ASSESSMENT: 78 year old female with reactive thrombocytosis. Her thrombocytosis is now resolved her platelets are back to normal at 358,000.  PLAN:  At this time a recommendation is to see me only on a as needed basis. This showed certainly I can be available to her should any questions arise. Of note patient had been anemic in the past I had recommended she take oral iron but she discontinued it. We did do iron studies on her today and I will see what the results are and we will call her with it.  All questions were answered. The patient knows to call the clinic with any problems, questions or concerns. We can certainly see the patient much sooner if necessary.  I spent 25 minutes counseling the patient face to face. The total time spent in the appointment was 30 minutes.    Drue Second, MD Medical/Oncology [name] [phone] (beeper) [phone] (Office)  01/29/2012, 3:16 PM

## 2012-01-29 NOTE — Telephone Encounter (Signed)
Per onc tx detail the pt can be seen as needed

## 2012-01-29 NOTE — Patient Instructions (Signed)
1. You can be seen on a needed

## 2012-01-30 NOTE — Telephone Encounter (Signed)
No note needed.  Error

## 2012-02-01 ENCOUNTER — Telehealth: Payer: Self-pay | Admitting: *Deleted

## 2012-02-01 NOTE — Telephone Encounter (Signed)
Per MD, notified pt to continue taking oral iron every other day.

## 2012-02-01 NOTE — Telephone Encounter (Signed)
Message copied by Cooper Render on Thu Feb 01, 2012 12:36 PM ------      Message from: Victorino December      Created: Thu Feb 01, 2012 12:14 PM       Call patient: pt iron ok she still needs to take it orally one every other day.

## 2013-01-03 ENCOUNTER — Other Ambulatory Visit: Payer: Self-pay | Admitting: *Deleted

## 2013-01-03 MED ORDER — ESTRADIOL 0.0375 MG/24HR TD PTTW: 1.0000 | MEDICATED_PATCH | TRANSDERMAL | Status: DC

## 2013-01-03 NOTE — Telephone Encounter (Signed)
Patient insurance would not cover  vivelle-dot. Required PA, per Dr. Darcel Bayley med changed to [facility] patch. Left message on pt. Phone vm to call office about this.  sue

## 2013-01-06 ENCOUNTER — Telehealth: Payer: Self-pay | Admitting: Certified Nurse Midwife

## 2013-01-06 NOTE — Telephone Encounter (Signed)
PT RETURNING SUE'S CALL FROM 01/03/13 RE: RX/PT UNSURE WHAT RX/[employer]

## 2013-01-06 NOTE — Telephone Encounter (Signed)
Returned patient call . Left message of info on Friday 3/21/ of insurance not cover her vivelle patch that needed a prior authorization. Sara Chu ,cnm , changed to medication of mimivelle  Patch that is covered by insurance. Patient to call back if any questions. Fannie Knee

## 2013-01-08 ENCOUNTER — Telehealth: Payer: Self-pay | Admitting: *Deleted

## 2013-01-08 NOTE — Telephone Encounter (Signed)
Medication vivelle-dot approved till 12/31/

## 2013-04-14 ENCOUNTER — Other Ambulatory Visit: Payer: Self-pay | Admitting: *Deleted

## 2013-04-14 MED ORDER — TESTOSTERONE PROPIONATE 2 % TD OINT: 1.0000 "application " | TOPICAL_OINTMENT | TRANSDERMAL | Status: DC

## 2013-04-14 NOTE — Telephone Encounter (Signed)
Fax from [facility]   Testerone Propionate Petroleum (Jar) 2% Ointment Last Refill: 05/01/12 # 60 x 1 year refills Next Aex: 05/06/13  #60 gram with no refills sent through to last pt. Until AEX

## 2013-04-29 ENCOUNTER — Encounter: Payer: Self-pay | Admitting: Certified Nurse Midwife

## 2013-05-05 ENCOUNTER — Other Ambulatory Visit: Payer: Self-pay | Admitting: Certified Nurse Midwife

## 2013-05-06 ENCOUNTER — Encounter: Payer: Self-pay | Admitting: Certified Nurse Midwife

## 2013-05-06 ENCOUNTER — Ambulatory Visit (INDEPENDENT_AMBULATORY_CARE_PROVIDER_SITE_OTHER): Payer: Medicare Other | Admitting: Certified Nurse Midwife

## 2013-05-06 VITALS — BP 118/70 | HR 64 | Ht 63.0 in | Wt 106.0 lb

## 2013-05-06 DIAGNOSIS — N952 Postmenopausal atrophic vaginitis: Secondary | ICD-10-CM

## 2013-05-06 DIAGNOSIS — N951 Menopausal and female climacteric states: Secondary | ICD-10-CM

## 2013-05-06 DIAGNOSIS — Z01419 Encounter for gynecological examination (general) (routine) without abnormal findings: Secondary | ICD-10-CM

## 2013-05-06 DIAGNOSIS — R6882 Decreased libido: Secondary | ICD-10-CM

## 2013-05-06 MED ORDER — TESTOSTERONE PROPIONATE 2 % TD OINT: 1.0000 "application " | TOPICAL_OINTMENT | TRANSDERMAL | Status: DC

## 2013-05-06 MED ORDER — ESTROGENS, CONJUGATED 0.625 MG/GM VA CREA: TOPICAL_CREAM | Freq: Every day | VAGINAL | Status: DC

## 2013-05-06 MED ORDER — ESTRADIOL 0.0375 MG/24HR TD PTTW: 1.0000 | MEDICATED_PATCH | TRANSDERMAL | Status: DC

## 2013-05-06 NOTE — Patient Instructions (Signed)

## 2013-05-06 NOTE — Progress Notes (Signed)
Patient ID: [name], female   DOB: 10/10/1933, 79 y.o.   MRN: [id number] 80 y.o. [id number] Married Caucasian F here for annual exam.  Menopausal on ERT no vaginal bleeding or vaginal dryness with use of Premarin cream use. Testosterone works very well for decrease libido uses twice a week. Sees PCP for aex, labs, BMD management and medication management. Just returned from another trip to [place]! No health issues today.  No LMP recorded. Patient has had a hysterectomy.          Sexually active: yes  The current method of family planning is status post hysterectomy.    Exercising: yes  cardio and strength training, yoga Smoker:  Former smoker, quit in 1980's  Health Maintenance: Pap:  04/16/08, normal  History of abnormal Pap:  no MMG:  03/2013, negative Colonoscopy:  2013, normal, repeat in 10 years BMD:   05/11/11, low bone mass TDaP:  2010 Screening Labs: PCP, Hb today: PCP, Urine today: PCP   reports that she has quit smoking. Her smoking use included Cigarettes. She has a 45 pack-year smoking history. She has never used smokeless tobacco. She reports that she drinks about 8.4 ounces of alcohol per week. She reports that she does not use illicit drugs.  Past Medical History  Diagnosis Date  . GERD (gastroesophageal reflux disease)   . Rotator cuff tear, left   . Arthritis     "osteoarthritis"  . Shortness of breath 08/28/11    "hard time breathing deeply because of the pain"  . Diarrhea 08/28/11    "for the last 17 days; from samonella poisoning"  . Headache(784.0) 08/28/11    "just for the last few days; releated to dehydration"    Past Surgical History  Procedure Laterality Date  . Back surgery  1/06    "spinal stenosis"  . Facial cosmetic surgery  ~ 2002  . Finger surgery  ~ 2000    "titanium rod in right pointer; from arthritis"  . Tonsillectomy  1943  . Tubal ligation  1970's  . Cataract extraction w/ intraocular lens  implant, bilateral  Summer 2011  . Vaginal  hysterectomy  2/04    "still have my ovaries"    Current Outpatient Prescriptions  Medication Sig Dispense Refill  . ACAI PO Take 1 tablet by mouth daily.        Marland Kitchen BIOTIN PO Take 1 tablet by mouth daily.        . calcium citrate-vitamin D 200-200 MG-UNIT TABS Take 1 tablet by mouth daily.        . Coenzyme Q10 (CO Q 10 PO) Take 1 tablet by mouth daily.        Marland Kitchen conjugated estrogens (PREMARIN) vaginal cream Place vaginally daily.      Marland Kitchen estradiol (MINIVELLE) 0.0375 MG/24HR Place 1 patch onto the skin 2 (two) times a week. . Per patient insurance to cover.  8 patch  6  . fish oil-omega-3 fatty acids 1000 MG capsule Take 2 g by mouth daily.        . Melatonin 10 MG TABS Take 10 mg by mouth every other day.      . meloxicam (MOBIC) 15 MG tablet Take 1 tablet by mouth daily.      . Multiple Vitamins-Minerals (MULTIVITAMINS THER. W/MINERALS) TABS Take 1 tablet by mouth daily.        Marland Kitchen omeprazole (PRILOSEC) 40 MG capsule Take 40 mg by mouth daily.        . S-Adenosylmethionine (  SAM-E PO) Take 400 mg by mouth daily.      Marland Kitchen sulfaSALAzine (AZULFIDINE) 500 MG tablet Take 500 mg by mouth daily.       . Testosterone Propionate 2 % OINT Place 1 application onto the skin 2 (two) times a week. Apply one tear drop size amount to vaginal area at bedtime 2x/wk  60 g  0  . VALERIAN PO Take 2 capsules by mouth every other day. Opposite the melatonin      . zolpidem (AMBIEN) 5 MG tablet Take 1 tablet (5 mg total) by mouth at bedtime as needed for sleep (insomnia).  15 tablet  0   No current facility-administered medications for this visit.    Family History  Problem Relation Age of Onset  . Cancer Father     colon    ROS:  Pertinent items are noted in HPI.  Otherwise, a comprehensive ROS was negative.  Exam:   BP 118/70  Pulse 64  Ht 5\' 3"  (1.6 m)  Wt 106 lb (48.081 kg)  BMI 18.78 kg/m2    Height: 5\' 3"  (160 cm)  Ht Readings from Last 3 Encounters:  05/06/13 5\' 3"  (1.6 m)  01/29/12 5\' 3"  (1.6 m)   10/23/11 5\' 3"  (1.6 m)    General appearance: alert, cooperative and appears stated age Head: Normocephalic, without obvious abnormality, atraumatic Neck: no adenopathy, supple, symmetrical, trachea midline and thyroid normal to inspection and palpation Lungs: clear to auscultation bilaterally Breasts: normal appearance, no masses or tenderness, No nipple retraction or dimpling, No nipple discharge or bleeding, No axillary or supraclavicular adenopathy Heart: regular rate and rhythm Abdomen: soft, non-tender; bowel sounds normal; no masses,  no organomegaly Extremities: extremities normal, atraumatic, no cyanosis or edema Skin: Skin color, texture, turgor normal. No rashes or lesions Lymph nodes: Cervical, supraclavicular, and axillary nodes normal. No abnormal inguinal nodes palpated Neurologic: Grossly normal   Pelvic: External genitalia:  no lesions              Urethra:  normal appearing urethra with no masses, tenderness or lesions              Bartholins and Skenes: normal                 Vagina: normal appearing vagina with normal color and discharge, no lesions              Cervix: absent              Pap taken: no Bimanual Exam:  Uterus:  uterus absent              Adnexa: normal adnexa and no mass, fullness, tenderness               Rectovaginal: Confirms               Anus:  normal sphincter tone, no lesions  A:  Well Woman with normal exam  Menopausal on ERT S/P TVH bleeding  Atrophic Vaginitis Premarin cream working well  Decreased libido Testosterone cream working well desires continuance  P:   Reviewed health and wellness pertinent to exam  Discussed risks and benefits of ERT, patient and provider feel appropriate to continue use Rx Minivelle see order  Continue to use 2 x weekly, advise if problems with use. Rx Premarin vaginal cream see order  Discussed risks and benefits of use, patient definitely desires continuance.  Rx Testosterone Propionate 2% ointment 2  x weekly See order, given  written Rx.  Continue follow up with PCP as indicated  Mammogram yearly pap smear not taken today  counseled on breast self exam, mammography screening, feminine hygiene, use and side effects of HRT, adequate intake of calcium and vitamin D, diet and exercise, Kegel's exercises  return annually or prn  An After Visit Summary was printed and given to the patient.

## 2013-05-13 NOTE — Progress Notes (Signed)
Note reviewed, agree with plan.  Yaneliz Radebaugh, MD  

## 2013-12-04 ENCOUNTER — Telehealth: Payer: Self-pay | Admitting: Certified Nurse Midwife

## 2013-12-04 NOTE — Telephone Encounter (Signed)
Patient states she dropped off forms from her insurance company a couple of weeks ago for prior authorization of Minivelle patches. She is calling to check on the status of this.  [facility] [employer]

## 2013-12-04 NOTE — Telephone Encounter (Signed)
Notified patient, approved through 10/15/14.  Routing to provider for final review. Patient agreeable to disposition. Will close encounter

## 2013-12-05 ENCOUNTER — Emergency Department (HOSPITAL_COMMUNITY)
Admission: EM | Admit: 2013-12-05 | Discharge: 2013-12-05 | Disposition: A | Discharge status: Home / Self Care | Payer: Medicare Other | Attending: Emergency Medicine | Admitting: Emergency Medicine

## 2013-12-05 ENCOUNTER — Encounter (HOSPITAL_COMMUNITY): Payer: Self-pay | Admitting: Emergency Medicine

## 2013-12-05 ENCOUNTER — Emergency Department (HOSPITAL_COMMUNITY): Payer: Medicare Other

## 2013-12-05 DIAGNOSIS — Y92009 Unspecified place in unspecified non-institutional (private) residence as the place of occurrence of the external cause: Secondary | ICD-10-CM | POA: Insufficient documentation

## 2013-12-05 DIAGNOSIS — S069XAA Unspecified intracranial injury with loss of consciousness status unknown, initial encounter: Secondary | ICD-10-CM

## 2013-12-05 DIAGNOSIS — Z87828 Personal history of other (healed) physical injury and trauma: Secondary | ICD-10-CM | POA: Insufficient documentation

## 2013-12-05 DIAGNOSIS — K219 Gastro-esophageal reflux disease without esophagitis: Secondary | ICD-10-CM | POA: Insufficient documentation

## 2013-12-05 DIAGNOSIS — S0083XA Contusion of other part of head, initial encounter: Secondary | ICD-10-CM

## 2013-12-05 DIAGNOSIS — S1093XA Contusion of unspecified part of neck, initial encounter: Secondary | ICD-10-CM

## 2013-12-05 DIAGNOSIS — S098XXA Other specified injuries of head, initial encounter: Secondary | ICD-10-CM

## 2013-12-05 DIAGNOSIS — M1990 Unspecified osteoarthritis, unspecified site: Secondary | ICD-10-CM | POA: Insufficient documentation

## 2013-12-05 DIAGNOSIS — Z791 Long term (current) use of non-steroidal anti-inflammatories (NSAID): Secondary | ICD-10-CM | POA: Insufficient documentation

## 2013-12-05 DIAGNOSIS — S0003XA Contusion of scalp, initial encounter: Secondary | ICD-10-CM | POA: Insufficient documentation

## 2013-12-05 DIAGNOSIS — Z79899 Other long term (current) drug therapy: Secondary | ICD-10-CM | POA: Insufficient documentation

## 2013-12-05 DIAGNOSIS — Z87891 Personal history of nicotine dependence: Secondary | ICD-10-CM | POA: Insufficient documentation

## 2013-12-05 DIAGNOSIS — W1809XA Striking against other object with subsequent fall, initial encounter: Secondary | ICD-10-CM | POA: Insufficient documentation

## 2013-12-05 DIAGNOSIS — Y9389 Activity, other specified: Secondary | ICD-10-CM | POA: Insufficient documentation

## 2013-12-05 DIAGNOSIS — W010XXA Fall on same level from slipping, tripping and stumbling without subsequent striking against object, initial encounter: Secondary | ICD-10-CM | POA: Insufficient documentation

## 2013-12-05 DIAGNOSIS — S069X9A Unspecified intracranial injury with loss of consciousness of unspecified duration, initial encounter: Secondary | ICD-10-CM

## 2013-12-05 DIAGNOSIS — W19XXXA Unspecified fall, initial encounter: Secondary | ICD-10-CM

## 2013-12-05 NOTE — ED Notes (Signed)
Judithann Graves, MD at bedside.

## 2013-12-05 NOTE — ED Provider Notes (Signed)
CSN: [id number]     Arrival date & time 12/05/13  2007 History   First MD Initiated Contact with Patient 12/05/13 2037     Chief Complaint  Patient presents with  . Fall  . Head Injury     Patient is a 80 y.o. female presenting with fall.  Fall This is a new problem. The current episode started today. The problem occurs constantly. The problem has been resolved. Associated symptoms include headaches. Pertinent negatives include no abdominal pain, anorexia, arthralgias, change in bowel habit, chest pain, chills, congestion, coughing, diaphoresis, fatigue, fever, joint swelling, myalgias, nausea, neck pain, numbness, rash, sore throat, swollen glands, urinary symptoms, vertigo, visual change, vomiting or weakness. Nothing aggravates the symptoms. She has tried nothing for the symptoms.    Past Medical History  Diagnosis Date  . GERD (gastroesophageal reflux disease)   . Rotator cuff tear, left   . Arthritis     "osteoarthritis"  . Shortness of breath 08/28/11    "hard time breathing deeply because of the pain"  . Diarrhea 08/28/11    "for the last 17 days; from [place] poisoning"  . Headache(784.0) 08/28/11    "just for the last few days; releated to dehydration"   Past Surgical History  Procedure Laterality Date  . Back surgery  1/06    "spinal stenosis"  . Facial cosmetic surgery  ~ 2002  . Finger surgery  ~ 2000    "titanium rod in right pointer; from arthritis"  . Tonsillectomy  1943  . Tubal ligation  1970's  . Cataract extraction w/ intraocular lens  implant, bilateral  Summer 2011  . Vaginal hysterectomy  2/04    "still have my ovaries"   Family History  Problem Relation Age of Onset  . Cancer Father     colon   History  Substance Use Topics  . Smoking status: Former Smoker -- 1.50 packs/day for 30 years    Types: Cigarettes  . Smokeless tobacco: Never Used  . Alcohol Use: 8.4 oz/week    10 Glasses of wine, 4 Shots of liquor per week     Comment: "usually  wine; sosmetimes vodka; couple drinks q night"   OB History   Grav Para Term Preterm Abortions TAB SAB Ect Mult Living   2 2 2       1      Review of Systems  Constitutional: Negative for fever, chills, diaphoresis, activity change, appetite change and fatigue.  HENT: Negative for congestion, ear pain, rhinorrhea and sore throat.   Eyes: Negative for pain.  Respiratory: Negative for cough and shortness of breath.   Cardiovascular: Negative for chest pain and palpitations.  Gastrointestinal: Negative for nausea, vomiting, abdominal pain, anorexia and change in bowel habit.  Genitourinary: Negative for dysuria, difficulty urinating and pelvic pain.  Musculoskeletal: Negative for arthralgias, back pain, joint swelling, myalgias and neck pain.  Skin: Negative for rash and wound.  Neurological: Positive for headaches. Negative for vertigo, weakness and numbness.  Psychiatric/Behavioral: Positive for confusion. Negative for behavioral problems and agitation.      Allergies  Review of patient's allergies indicates no known allergies.  Home Medications   Current Outpatient Rx  Name  Route  Sig  Dispense  Refill  . Calcium Citrate (CITRACAL PO)   Oral   Take 1 tablet by mouth daily.         Marland Kitchen estradiol (VIVELLE-DOT) 0.0375 MG/24HR   Transdermal   Place 1 patch onto the skin 2 (two) times a  week. Monday and Friday         . meloxicam (MOBIC) 15 MG tablet   Oral   Take 7.5 mg by mouth daily.          Marland Kitchen OVER THE COUNTER MEDICATION   Both Eyes   Place 1 drop into both eyes 2 (two) times daily as needed (redness/ dry eyes). Over the counter eye drops         . OVER THE COUNTER MEDICATION   Oral   Take 2 tablets by mouth at bedtime as needed (sleep). Aleve PM         . pantoprazole (PROTONIX) 40 MG tablet   Oral   Take 40 mg by mouth 2 (two) times daily.          Marland Kitchen PRESCRIPTION MEDICATION   Topical   Apply 1 application topically 2 (two) times a week. Apply to  vaginal area on Sunday and Thursday   (Testosterone ointment compounded at [place])         . zolpidem (AMBIEN) 5 MG tablet   Oral   Take 1 tablet (5 mg total) by mouth at bedtime as needed for sleep (insomnia).   15 tablet   0    BP 118/50  Pulse 56  Temp(Src) 97.7 F (36.5 C) (Oral)  Resp 12  Wt 104 lb (47.174 kg)  SpO2 100% Physical Exam  Constitutional: She is oriented to person, place, and time. She appears well-developed and well-nourished. No distress.  HENT:  Head: Normocephalic and atraumatic.  Nose: Nose normal.  Mouth/Throat: Oropharynx is clear and moist.  Eyes: EOM are normal. Pupils are equal, round, and reactive to light.  Neck: Normal range of motion. Neck supple. No tracheal deviation present.  Cardiovascular: Normal rate, regular rhythm, normal heart sounds and intact distal pulses.   Pulmonary/Chest: Effort normal and breath sounds normal. She has no rales.  Abdominal: Soft. Bowel sounds are normal. She exhibits no distension. There is no tenderness. There is no rebound and no guarding.  Musculoskeletal: Normal range of motion. She exhibits no tenderness.  No midline C, T or L spine ttp. No TTP to extremities. No hip pain. Patient is able to bear weight.   Neurological: She is alert and oriented to person, place, and time.  Skin: Skin is warm and dry. No rash noted.  Psychiatric: She has a normal mood and affect. Her behavior is normal.    ED Course  Procedures (including critical care time) Labs Review Labs Reviewed - No data to display Imaging Review Ct Head Wo Contrast  12/05/2013   CLINICAL DATA:  Fall .  EXAM: CT HEAD WITHOUT CONTRAST  TECHNIQUE: Contiguous axial images were obtained from the base of the skull through the vertex without intravenous contrast.  COMPARISON:  MR C SPINE W/O CM dated 08/31/2011  FINDINGS: No mass. No hydrocephalus. No hemorrhage. White matter changes consistent with chronic ischemia. Orbits are unremarkable.  No acute bony abnormality. Air-fluid level noted in the left maxillary sinus consistent with sinusitis. If facial fracture is a clinical concern facial CT can be obtained.  IMPRESSION: 1. Findings suggesting left maxillary sinusitis. 2. No acute abnormality otherwise noted. White matter changes consistent with chronic ischemia .   Electronically Signed   By: Marcello Moores  Register   On: 12/05/2013 21:46      MDM   Final diagnoses:  Fall  Blunt head injury  Mild TBI (traumatic brain injury)    80 yo F in NAD  AFVSS slipped on ice prior to arrival. No LOC initially was confused but now at baseline. Neuro exam non focal. Exam without focal ttp. No laceration to scalp.  Will obtain head CT scan.  Not on anticoagulation. No midline C , T or L spine TTP. C spine cleared by NEXUS criteria.   10:03 PM Ct scan with L maxillary sinusitis . The patient reports no TTP of L maxillary sinus. Otherwise CT head with NAICA.   Thorough discussion with family and patient on plan findings and return precautions.   Case discussed with my attending Dr. Stevie Kern.   Ruthell Rummage, MD 12/05/13 [phone]

## 2013-12-05 NOTE — Discharge Instructions (Signed)
Concussion, Adult  A concussion is a brain injury. It is caused by:  · A hit to the head.  · A quick and sudden movement (jolt) of the head or neck.  A concussion is usually not life-threatening. Even so, it can cause serious problems. If you had a concussion before, you may have concussion-like problems after a hit to your head.  HOME CARE  General Instructions  · Follow your doctor's directions carefully.  · Take medicines only as told by your doctor.  · Only take medicines your doctor says are safe.  · Do not drink alcohol until your doctor says it is OK. Alcohol and some drugs can slow down healing. They can also put you at risk for further injury.  · If your are having trouble remembering things, write them down.  · Try to do one thing at a time if you get distracted easily. For example, do not watch TV while making dinner.  · Talk to your family members or close friends when making important decisions.  · Follow up with your doctor as told.  · Watch your symptoms. Tell others to do the same. Serious problems can sometimes happen after a concussion. Older adults are more likely to have these problems.  · Tell your teachers, school nurse, school counselor, coach, athletic trainer, or work manager about your concussion. Tell them about what you can or cannot do. They should watch to see if:  · It gets even harder for you to pay attention or concentrate.  · It gets even harder for you to remember things or learn new things.  · You need more time than normal to finish things.  · You become annoyed (irritable) more than before.  · You are not able to deal with stress as well.  · You have more problems than before.  · Rest. Make sure you:  · Get plenty of sleep at night.  · Go to sleep early.  · Go to bed at the same time every day. Try to wake up at the same time.  · Rest during the day.  · Take naps when you feel tired.  · Limit activities where you have to think a lot or concentrate. These include:  · Doing  homework.  · Doing work related to a job.  · Watching TV.  · Using the computer.  Returning To Your Regular Activities  Return to your normal activities slowly, not all at once. You must give your body and brain enough time to heal.   · Do not play sports or do other athletic activities until your doctor says it is OK.  · Ask your doctor when you can drive, ride a bicycle, or work other vehicles or machines. Never do these things if you feel dizzy.  · Ask your doctor about when you can return to work or school.  Preventing Another Concussion  It is very important to avoid another brain injury, especially before you have healed. In rare cases, another injury can lead to permanent brain damage, brain swelling, or death. The risk of this is greatest during the first 7 10 after your injury. Avoid injuries by:   · Wearing a seat belt when riding in a car.  · Not drinking too much alcohol.  · Avoiding activities that could lead to a second concussion (such as contact sports).  · Wearing a helmet when doing activities like:  · Biking.  · Skiing.  · Skateboarding.  · Skating.  ·   Making your home safer by:  · Removing things from the floor or stairways that could make you trip.  · Using grab bars in bathrooms and handrails by stairs.  · Placing non-slip mats on floors and in bathtubs.  · Improve lighting in dark areas.  GET HELP IF:  · It gets even harder for you to pay attention or concentrate.  · It gets even harder for you to remember things or learn new things.  · You need more time than normal to finish things.  · You become annoyed (irritable) more than before.  · You are not able to deal with stress as well.  · You have more problems than before.  · You have problems keeping your balance.  · You are not able to react quickly when you should.  Get help if you have any of these problems for more than 2 weeks:   · Lasting (chronic) headaches.  · Dizziness or trouble balancing.  · Feeling sick to your stomach  (nausea).  · Seeing (vision) problems.  · Being affected by noises or light more than normal.  · Feeling sad, low, down in the dumps, blue, gloomy, or empty (depressed).  · Mood changes (mood swings).  · Feeling of fear or nervousness about what may happen (anxiety).  · Feeling annoyed.  · Memory problems.  · Problems concentrating or paying attention.  · Sleep problems.  · Feeling tired all the time.  GET HELP RIGHT AWAY IF:   · You have bad headaches or your headaches get worse.  · You have weakness (even if it is in one hand, leg, or part of the face).  · You have loss of feeling (numbness).  · You feel off balance.  · You keep throwing up (vomiting).  · You feel tired.  · One black center of your eye (pupil) is larger than the other.  · You twitch or shake violently (convulse).  · Your speech is not clear (slurred).  · You are more confused, easily angered (agitated), or annoyed than before.  · You have more trouble resting than before.  · You are unable to recognize people or places.  · You have neck pain.  · It is difficult to wake you up.  · You have unusual behavior changes.  · You pass out (lose consciousness).  MAKE SURE YOU:   · Understand these instructions.  · Will watch your condition.  · Will get help right away if you are not doing well or get worse.  Document Released: 09/20/2009 Document Revised: 07/23/2013 Document Reviewed: 04/24/2013  ExitCare® Patient Information ©2014 [employer].

## 2013-12-05 NOTE — ED Provider Notes (Signed)
I saw and evaluated the patient, reviewed the resident's note and I agree with the findings and plan.  Minor CHI with mild concussive Sxs. No anticoagulation, no C-S tenderness  Babette Relic, MD 12/06/13 [phone]

## 2013-12-05 NOTE — ED Notes (Addendum)
Pt in with husband who states that when they got home tonight patient slipped and fell on the ice and hit her head, denies LOC, but since injury patient has been asking repetitive questions, answers questions appropriately in triage but was unable to tell answer where they had dinner, pt alert, does not take any blood thinning medications. Pt c/o head and neck pain.

## 2014-01-02 ENCOUNTER — Other Ambulatory Visit: Payer: Self-pay | Admitting: *Deleted

## 2014-01-02 NOTE — Telephone Encounter (Signed)
eScribe request from [facility] for refill on TESTOSTERONE  Last filled - 05/06/2013 Last AEX - 05/06/13 Next AEX - 05/07/14  Please advise refills. Fax [phone]

## 2014-01-06 ENCOUNTER — Telehealth: Payer: Self-pay | Admitting: *Deleted

## 2014-01-06 NOTE — Telephone Encounter (Signed)
Incoming fax from [facility] requesting Testosterone Propionate Petrolatum (jar) 2% Ointment  Last AEX and refill 05/06/2013 #60g/1 refill - Discontinued Reason Inpatient Standard.  Next appt 05/07/2013  Paper Chart in your door. Please approve or deny rx.

## 2014-01-07 MED ORDER — NONFORMULARY OR COMPOUNDED ITEM: Status: DC

## 2014-01-08 NOTE — Telephone Encounter (Signed)
Faxed to [facility]. 

## 2014-01-08 NOTE — Telephone Encounter (Signed)
We need to deny RX at this time

## 2014-05-07 ENCOUNTER — Encounter: Payer: Self-pay | Admitting: Certified Nurse Midwife

## 2014-05-07 ENCOUNTER — Ambulatory Visit (INDEPENDENT_AMBULATORY_CARE_PROVIDER_SITE_OTHER): Payer: Medicare Other | Admitting: Certified Nurse Midwife

## 2014-05-07 VITALS — BP 108/60 | HR 60 | Resp 18 | Ht 62.5 in | Wt 104.0 lb

## 2014-05-07 DIAGNOSIS — Z0000 Encounter for general adult medical examination without abnormal findings: Secondary | ICD-10-CM

## 2014-05-07 DIAGNOSIS — R6882 Decreased libido: Secondary | ICD-10-CM

## 2014-05-07 DIAGNOSIS — Z01419 Encounter for gynecological examination (general) (routine) without abnormal findings: Secondary | ICD-10-CM

## 2014-05-07 LAB — POCT URINALYSIS DIPSTICK
BILIRUBIN UA: NEGATIVE
Blood, UA: NEGATIVE
GLUCOSE UA: NEGATIVE
Ketones, UA: NEGATIVE
LEUKOCYTES UA: NEGATIVE
NITRITE UA: NEGATIVE
Protein, UA: NEGATIVE
UROBILINOGEN UA: NEGATIVE
pH, UA: 5

## 2014-05-07 MED ORDER — ESTROGENS, CONJUGATED 0.625 MG/GM VA CREA: TOPICAL_CREAM | VAGINAL | Status: DC

## 2014-05-07 MED ORDER — NONFORMULARY OR COMPOUNDED ITEM: Status: DC

## 2014-05-07 NOTE — Patient Instructions (Signed)

## 2014-05-07 NOTE — Progress Notes (Signed)
80 y.o. G33P2001 Married Caucasian Fe here for annual exam.  Menopausal on ERT and desires continuance. Denies any vaginal bleeding. Vaginal dryness controlled with Premarin vaginal cream Testosterone working well for libido, using only once weekly. Patient recent aex with PCP normal and labs normal per patient. Has mammogram scheduled. Had last BMD with [facility]. Will request records. No health issues today. Has been traveling in [place] over the past year!!  No LMP recorded. Patient has had a hysterectomy.          Sexually active: Yes.    The current method of family planning is status post hysterectomy.    Exercising: Yes.    Walking, Weights, yoga daily Smoker:  Former  Health Maintenance: Pap: 04/2008 Neg MMG: 03/2013 neg Colonoscopy:  2013 repeat 10 years  BMD:  04/2011 TDaP: 2010 Labs: PCP   reports that she has quit smoking. Her smoking use included Cigarettes. She has a 45 pack-year smoking history. She has never used smokeless tobacco. She reports that she drinks about 8.4 ounces of alcohol per week. She reports that she does not use illicit drugs.  Past Medical History  Diagnosis Date  . GERD (gastroesophageal reflux disease)   . Rotator cuff tear, left   . Arthritis     "osteoarthritis"  . Shortness of breath 08/28/11    "hard time breathing deeply because of the pain"  . Diarrhea 08/28/11    "for the last 17 days; from [place] poisoning"  . Headache(784.0) 08/28/11    "just for the last few days; releated to dehydration"  . Hearing aid worn     Past Surgical History  Procedure Laterality Date  . Back surgery  1/06    "spinal stenosis"  . Facial cosmetic surgery  ~ 2002  . Finger surgery  ~ 2000    "titanium rod in right pointer; from arthritis"  . Tonsillectomy  1943  . Tubal ligation  1970's  . Cataract extraction w/ intraocular lens  implant, bilateral  Summer 2011  . Vaginal hysterectomy  2/04    "still have my ovaries"    Current Outpatient Prescriptions   Medication Sig Dispense Refill  . Biotin 10 MG TABS Take by mouth daily.      . Calcium Citrate (CITRACAL PO) Take 1 tablet by mouth daily.      . Coenzyme Q10 (CO Q 10 PO) Take by mouth daily.      Marland Kitchen estradiol (VIVELLE-DOT) 0.0375 MG/24HR Place 1 patch onto the skin 2 (two) times a week. Monday and Friday      . meloxicam (MOBIC) 15 MG tablet Take 7.5 mg by mouth daily.       . Multiple Vitamin (MULTIVITAMIN) tablet Take 1 tablet by mouth daily.      . NONFORMULARY OR COMPOUNDED ITEM TESTOSTERONE PROPIONATE PETROLATUM (JAR)  APPLY ONE TEAR DROP SIZE AMOUNT TO VAGINAL AREA AT BEDTIME TWO TIMES WEEKLY.  60 each  1  . OVER THE COUNTER MEDICATION Place 1 drop into both eyes 2 (two) times daily as needed (redness/ dry eyes). Over the counter eye drops      . OVER THE COUNTER MEDICATION Take 2 tablets by mouth at bedtime as needed (sleep). Aleve PM      . pantoprazole (PROTONIX) 40 MG tablet Take 40 mg by mouth 2 (two) times daily.       Marland Kitchen PREMARIN vaginal cream       . zolpidem (AMBIEN) 5 MG tablet Take 1 tablet (5 mg total) by  mouth at bedtime as needed for sleep (insomnia).  15 tablet  0  . ciclopirox (PENLAC) 8 % solution        No current facility-administered medications for this visit.    Family History  Problem Relation Age of Onset  . Cancer Father     colon    ROS:  Pertinent items are noted in HPI.  Otherwise, a comprehensive ROS was negative.  Exam:   BP 108/60  Pulse 60  Resp 18  Ht 5' 2.5" (1.588 m)  Wt 104 lb (47.174 kg)  BMI 18.71 kg/m2 Height: 5' 2.5" (158.8 cm)  Ht Readings from Last 3 Encounters:  05/07/14 5' 2.5" (1.588 m)  05/06/13 5\' 3"  (1.6 m)  01/29/12 5\' 3"  (1.6 m)    General appearance: alert, cooperative and appears stated age Head: Normocephalic, without obvious abnormality, atraumatic Neck: no adenopathy, supple, symmetrical, trachea midline and thyroid  Lungs: clear to auscultation bilaterally Breasts: No nipple retraction or dimpling, No nipple  discharge or bleeding, No axillary or supraclavicular adenopathy Heart: regular rate and rhythm Abdomen: soft, non-tender; no masses,  no organomegaly Extremities: extremities normal, atraumatic, no cyanosis or edema Skin: Skin color, texture, turgor normal. No rashes or lesions Lymph nodes: Cervical, supraclavicular, and axillary nodes normal. No abnormal inguinal nodes palpated Neurologic: Grossly normal   Pelvic: External genitalia:  no lesions              Urethra:  normal appearing urethra with no masses, tenderness or lesions              Bartholin's and Skene's: normal                 Vagina: normal appearing vagina with normal color and discharge, no lesions              Cervix: absent              Pap taken: No. Bimanual Exam:  Uterus:  uterus absent              Adnexa: normal adnexa and no mass, fullness, tenderness               Rectovaginal: Confirms               Anus:  normal sphincter tone, no lesions  A:  Well Woman with normal exam  Menopausal on ERT desires continuance  Atrophic Vaginitis uses Premarin cream  Decreased libido with testosterone compound use weekly, working well  Mammogram due  BMD managed with PCP  P:   Reviewed health and wellness pertinent to exam  Discussed risks and benefits of ERT and WHI information on long term use and cardiovascular changes and concern with breast cancer. Patient does not have brain fog with use and feels much better on. She has routine PCP visits and is active. Patient and provider agree together that patient will report in change in health status and stop ERT.  Rx Vivelle dot see order  Rx Premarin cream see order  Rx Testosterone  Ointment see order  Pap smear not taken today   counseled on breast self exam, mammography screening stressed keeping appointment for continued estrogen use, use and side effects of HRT, adequate intake of calcium and vitamin D, diet and exercise, Kegel's exercises return annually or  prn  An After Visit Summary was printed and given to the patient.

## 2014-05-08 NOTE — Progress Notes (Signed)
Note reviewed, agree with plan.  Erasmo Vertz, MD  

## 2014-05-22 ENCOUNTER — Other Ambulatory Visit: Payer: Self-pay | Admitting: Certified Nurse Midwife

## 2014-05-22 NOTE — Telephone Encounter (Signed)
According to AEX  note (05/07/14) it said that rx was refilled and to see order but no rx was sent.  Minivelle 0.0375 mg #8/12 refills sent to pharmacy.  Routed to provider for review, encounter closed.

## 2014-11-23 ENCOUNTER — Telehealth: Payer: Self-pay | Admitting: Emergency Medicine

## 2014-11-23 NOTE — Telephone Encounter (Signed)
Debbi,  If patient agreeable, okay to change back to Vivelle Dot 0.0375 mg Patch from Minivelle 0.0375 mg patch due to [place] not covered under patient's formulary?

## 2014-11-23 NOTE — Telephone Encounter (Signed)
Message left to return call to [place] at [phone].   Need to discuss formulary authorization request received from patient's insurance, [employer]. Patient's Rx for Minivelle is not on Formulary.  Will discuss with patient.   Patient was previously on Vivelle Dot but was changed due to prior insurance not having Vivelle dot on their formulary.  If patient okay to use Vivelle Dot, will need prior authorization for new Vivelle Dot Rx.

## 2014-11-23 NOTE — Telephone Encounter (Signed)
I think it is fine

## 2014-11-27 MED ORDER — ESTRADIOL 0.0375 MG/24HR TD PTTW: 1.0000 | MEDICATED_PATCH | TRANSDERMAL | Status: DC

## 2014-11-27 NOTE — Telephone Encounter (Signed)
Left message to call Deakon Frix at [phone]. 

## 2014-11-27 NOTE — Telephone Encounter (Signed)
Spoke with patient. Patient states that she is okay with staying on the [place] or switching to the Vivelle Dot. "As long as I can get one of them I am okay with either." Advised patient will initiate prior authorization and will return call once we have a decision from the insurance company. Patient is agreeable.

## 2014-11-27 NOTE — Telephone Encounter (Signed)
New rx for Vivelle dot 0.0375mg  #8 5RF sent to pharmacy on file. PA for Vivelle dot to Regina Eck CNM for review ans signature.

## 2014-12-02 NOTE — Telephone Encounter (Signed)
Spoke with patient and advised new order sent. Advised order sent. Prior authorization pending.  Routing to provider for final review. Patient agreeable to disposition. Will close encounter

## 2014-12-04 ENCOUNTER — Telehealth: Payer: Self-pay | Admitting: Certified Nurse Midwife

## 2014-12-04 NOTE — Telephone Encounter (Signed)
Left message to call Gay at [phone].  Need to advise patient that Vivelle dot has been sent to [facility] on [address] and PA has been approved from 12/04/2014-10/16/2015.

## 2014-12-04 NOTE — Telephone Encounter (Signed)
Rx sent 11/27/14 #8/5R. To [facility]. Prior Authorization pending.  Routed to Triage.   Karen Chafe, RN at 12/02/2014 2:27 PM     Status: Signed       Expand All Collapse All   Spoke with patient and advised new order sent. Advised order sent. Prior authorization pending.  Routing to provider for final review. Patient agreeable to disposition. Will close encounter

## 2014-12-04 NOTE — Telephone Encounter (Signed)
Pt requests vivelle-dot sent to [facility] on [address]  bf

## 2014-12-08 NOTE — Telephone Encounter (Signed)
Left detailed message at number provided [phone] (Okay per ROI). Advised rx for Vivelle dot has been sent to [facility] and that PA was approved. Advised if any further questions to return call to the office.  Routing to provider for final review. Patient agreeable to disposition. Will close encounter

## 2015-01-01 ENCOUNTER — Other Ambulatory Visit: Payer: Self-pay | Admitting: *Deleted

## 2015-01-01 DIAGNOSIS — R6882 Decreased libido: Secondary | ICD-10-CM

## 2015-01-01 NOTE — Telephone Encounter (Signed)
Medication refill request: Testosterone Propionate Petralatum (Jar) 2% Ointment Last AEX:  05/07/14 with DL Next AEX: 05/12/15 with SM  Last MMG (if hormonal medication request): 06/01/14 Breast Density Category B; Bi-Rads 2: Benign Refill authorized: #60/1 rfs, please advise  Routed to PG since DL is out of office.

## 2015-01-03 MED ORDER — NONFORMULARY OR COMPOUNDED ITEM: Status: DC

## 2015-01-04 MED ORDER — NONFORMULARY OR COMPOUNDED ITEM: Status: DC

## 2015-01-04 NOTE — Addendum Note (Signed)
Addended by: Alfonzo Feller on: 01/04/2015 11:57 AM   Modules accepted: Orders

## 2015-01-04 NOTE — Telephone Encounter (Signed)
RX printed, signed by Ms. Patty and faxed to [facility].

## 2015-02-24 ENCOUNTER — Telehealth: Payer: Self-pay | Admitting: Certified Nurse Midwife

## 2015-02-24 NOTE — Telephone Encounter (Signed)
Called [employer]. Was transferred to [place] at [phone]. She sees that there is an authorization on file for approval of Vivelle dot. She is now working with her supervisor to change approval. She states she will call back in the morning with an update and speak with me.   Message left to return call to [place] at [phone] to patient to give update.

## 2015-02-24 NOTE — Telephone Encounter (Signed)
Patient wants to talk with the nurse about her prescription she states the insurance denied it.

## 2015-02-25 NOTE — Telephone Encounter (Signed)
Received call from [place] at  [facility]. She states that patient will need additional prior authorization for generic estradiol and that it will be placed under tier 2 for cost.   Prior authorization completed and faxed to  [employer] at this time with fax confirmation received.

## 2015-02-25 NOTE — Telephone Encounter (Signed)
Received incoming call from Judson Roch at [facility]. She states that authorization is for brand Vivelle only. Unsure if patient prefers brand or generic. Brand for one month is tier 4, $85.00 cost per month.   Spoke with patient. She has been using generic. She is paying $79.00 for one month of generic patches.   Advised I am working with insurance to work out coverage and will return her call. She is agreeable.

## 2015-02-25 NOTE — Telephone Encounter (Signed)
Received fax from [facility] that states that prior authorization is not required for generic estradiol patch 0.0375 mg patch bi weekly.  Called to [employer] to review as this medication does indeed require a prior authorization per [employer]. I was advised by representative Mervin Kung to call  [employer] directly. I called  [employer] directly and was connected to Ivin Booty at member concierage who then transferred me to provider services at [phone] and I spoke with representative [place] at  [facility] who advised me to call [facility]. Called [facility] again and representative states that she will have a representative call me back who can take a prior authorization over the phone. Called  [employer] provider concierge department at [phone] and spoke with representative Irma who states that she will need to have a representative call me back to discuss claim.  Again, faxed prior authorization request for generic estradiol 0.0375 mg biweekly patch to [facility] at [phone] to request that prior authorization be re-reviewed as medication does require a prior authorization.

## 2015-02-26 NOTE — Telephone Encounter (Signed)
Call to [facility] after receiving fax that states that "[place]!!!!!!"   Re-faxed prior authorization this is third fax.   Called [facility] and spoke with representative, Vivien Rota, who transferred me to representative, Georgina Snell who states that prior authorization has now been denied, and an appeal must be made. After review he states that form was never received so denial was made based on form not being received. Georgina Snell has transferred me to Marjorie Smolder who is in clinical staffing. Angelina then transferred me Therapist, music.   Total time of call: 80 minutes.

## 2015-03-02 NOTE — Telephone Encounter (Signed)
Called [facility].  They have approved authorization appeal.  Called and spoke with Rob at [employer]. Rx is covered for $15.00 dollars per month.  Called patient and advised. She is agreeable. [facility] thanks for working with her insurance to get generic coverage.  Patient advised to call with any issues related to prescription or any concern. Patient agreeable. Routing to provider for final review. Patient agreeable to disposition. Will close encounter.

## 2015-04-09 ENCOUNTER — Encounter (HOSPITAL_BASED_OUTPATIENT_CLINIC_OR_DEPARTMENT_OTHER): Payer: PPO | Attending: Internal Medicine

## 2015-04-09 DIAGNOSIS — X58XXXA Exposure to other specified factors, initial encounter: Secondary | ICD-10-CM | POA: Diagnosis not present

## 2015-04-09 DIAGNOSIS — M1990 Unspecified osteoarthritis, unspecified site: Secondary | ICD-10-CM | POA: Diagnosis not present

## 2015-04-09 DIAGNOSIS — S81812A Laceration without foreign body, left lower leg, initial encounter: Secondary | ICD-10-CM | POA: Diagnosis present

## 2015-04-09 DIAGNOSIS — K219 Gastro-esophageal reflux disease without esophagitis: Secondary | ICD-10-CM | POA: Diagnosis not present

## 2015-04-16 ENCOUNTER — Encounter (HOSPITAL_BASED_OUTPATIENT_CLINIC_OR_DEPARTMENT_OTHER): Payer: PPO | Attending: Internal Medicine

## 2015-04-16 DIAGNOSIS — M1990 Unspecified osteoarthritis, unspecified site: Secondary | ICD-10-CM | POA: Diagnosis not present

## 2015-04-16 DIAGNOSIS — L97221 Non-pressure chronic ulcer of left calf limited to breakdown of skin: Secondary | ICD-10-CM | POA: Diagnosis not present

## 2015-04-16 DIAGNOSIS — I7300 Raynaud's syndrome without gangrene: Secondary | ICD-10-CM | POA: Diagnosis not present

## 2015-04-21 DIAGNOSIS — L97221 Non-pressure chronic ulcer of left calf limited to breakdown of skin: Secondary | ICD-10-CM | POA: Diagnosis not present

## 2015-04-30 DIAGNOSIS — L97221 Non-pressure chronic ulcer of left calf limited to breakdown of skin: Secondary | ICD-10-CM | POA: Diagnosis not present

## 2015-04-30 DIAGNOSIS — I7300 Raynaud's syndrome without gangrene: Secondary | ICD-10-CM | POA: Diagnosis not present

## 2015-04-30 DIAGNOSIS — M1990 Unspecified osteoarthritis, unspecified site: Secondary | ICD-10-CM | POA: Diagnosis not present

## 2015-05-04 ENCOUNTER — Encounter (HOSPITAL_BASED_OUTPATIENT_CLINIC_OR_DEPARTMENT_OTHER): Payer: Self-pay

## 2015-05-07 DIAGNOSIS — I7300 Raynaud's syndrome without gangrene: Secondary | ICD-10-CM | POA: Diagnosis not present

## 2015-05-07 DIAGNOSIS — M1990 Unspecified osteoarthritis, unspecified site: Secondary | ICD-10-CM | POA: Diagnosis not present

## 2015-05-07 DIAGNOSIS — L97221 Non-pressure chronic ulcer of left calf limited to breakdown of skin: Secondary | ICD-10-CM | POA: Diagnosis not present

## 2015-05-12 ENCOUNTER — Ambulatory Visit: Payer: Medicare Other | Admitting: Certified Nurse Midwife

## 2015-05-21 ENCOUNTER — Encounter (HOSPITAL_BASED_OUTPATIENT_CLINIC_OR_DEPARTMENT_OTHER): Payer: PPO | Attending: Internal Medicine

## 2015-05-21 DIAGNOSIS — L97821 Non-pressure chronic ulcer of other part of left lower leg limited to breakdown of skin: Secondary | ICD-10-CM | POA: Diagnosis present

## 2015-05-21 DIAGNOSIS — M1990 Unspecified osteoarthritis, unspecified site: Secondary | ICD-10-CM | POA: Insufficient documentation

## 2015-05-21 DIAGNOSIS — I7300 Raynaud's syndrome without gangrene: Secondary | ICD-10-CM | POA: Insufficient documentation

## 2015-05-24 ENCOUNTER — Other Ambulatory Visit: Payer: Self-pay | Admitting: Certified Nurse Midwife

## 2015-05-24 NOTE — Telephone Encounter (Signed)
Medication refill request: Estradiol  Last AEX:  05/07/14 DL Next AEX: 07/09/15 DL Last MMG (if hormonal medication request): 06/01/14 Bi-rads C 2 Benign Refill authorized: please advise.

## 2015-05-27 DIAGNOSIS — M1990 Unspecified osteoarthritis, unspecified site: Secondary | ICD-10-CM | POA: Diagnosis not present

## 2015-05-27 DIAGNOSIS — L97821 Non-pressure chronic ulcer of other part of left lower leg limited to breakdown of skin: Secondary | ICD-10-CM | POA: Diagnosis not present

## 2015-05-27 DIAGNOSIS — I7300 Raynaud's syndrome without gangrene: Secondary | ICD-10-CM | POA: Diagnosis not present

## 2015-06-04 DIAGNOSIS — I7300 Raynaud's syndrome without gangrene: Secondary | ICD-10-CM | POA: Diagnosis not present

## 2015-06-04 DIAGNOSIS — M1990 Unspecified osteoarthritis, unspecified site: Secondary | ICD-10-CM | POA: Diagnosis not present

## 2015-06-04 DIAGNOSIS — L97821 Non-pressure chronic ulcer of other part of left lower leg limited to breakdown of skin: Secondary | ICD-10-CM | POA: Diagnosis not present

## 2015-06-05 ENCOUNTER — Other Ambulatory Visit: Payer: Self-pay | Admitting: Certified Nurse Midwife

## 2015-06-07 NOTE — Telephone Encounter (Signed)
Medication refill request: Premarin Vaginal Cream 30 gm Last AEX:  05/07/14 with DL  Next AEX:  07/09/15 with DL  Last MMG (if hormonal medication request): 06/01/14 breast density category b: bi-rads 2 benign Refill authorized: #30 gm  Routed to Ms. Patty since Ms. Jackelyn Poling is out of the office today)

## 2015-06-11 DIAGNOSIS — M1990 Unspecified osteoarthritis, unspecified site: Secondary | ICD-10-CM | POA: Diagnosis not present

## 2015-06-11 DIAGNOSIS — I7300 Raynaud's syndrome without gangrene: Secondary | ICD-10-CM | POA: Diagnosis not present

## 2015-06-11 DIAGNOSIS — L97821 Non-pressure chronic ulcer of other part of left lower leg limited to breakdown of skin: Secondary | ICD-10-CM | POA: Diagnosis not present

## 2015-06-18 ENCOUNTER — Encounter (HOSPITAL_BASED_OUTPATIENT_CLINIC_OR_DEPARTMENT_OTHER): Payer: PPO | Attending: Internal Medicine

## 2015-06-18 DIAGNOSIS — L97221 Non-pressure chronic ulcer of left calf limited to breakdown of skin: Secondary | ICD-10-CM | POA: Insufficient documentation

## 2015-06-18 DIAGNOSIS — M1990 Unspecified osteoarthritis, unspecified site: Secondary | ICD-10-CM | POA: Diagnosis not present

## 2015-06-18 DIAGNOSIS — I7300 Raynaud's syndrome without gangrene: Secondary | ICD-10-CM | POA: Diagnosis not present

## 2015-06-21 ENCOUNTER — Other Ambulatory Visit: Payer: Self-pay | Admitting: Nurse Practitioner

## 2015-06-22 NOTE — Telephone Encounter (Signed)
Medication refill request: Vivelle Dot Last AEX:  05/07/14 with DL  Next AEX: 07/09/15 with DL Last MMG (if hormonal medication request): 06/01/14 3D dense, category b, Bi-rads C 2 Benign Refill authorized: please advise.

## 2015-06-22 NOTE — Telephone Encounter (Signed)
Needs current mammogram, will refill one month only

## 2015-06-25 DIAGNOSIS — L97221 Non-pressure chronic ulcer of left calf limited to breakdown of skin: Secondary | ICD-10-CM | POA: Diagnosis not present

## 2015-06-25 DIAGNOSIS — M1990 Unspecified osteoarthritis, unspecified site: Secondary | ICD-10-CM | POA: Diagnosis not present

## 2015-06-25 DIAGNOSIS — I7300 Raynaud's syndrome without gangrene: Secondary | ICD-10-CM | POA: Diagnosis not present

## 2015-06-28 NOTE — Telephone Encounter (Signed)
Patient was given a call and message was left regarding scheduling a mammogram.

## 2015-07-09 ENCOUNTER — Ambulatory Visit (INDEPENDENT_AMBULATORY_CARE_PROVIDER_SITE_OTHER): Payer: PPO | Admitting: Certified Nurse Midwife

## 2015-07-09 ENCOUNTER — Encounter: Payer: Self-pay | Admitting: Certified Nurse Midwife

## 2015-07-09 VITALS — BP 118/62 | HR 72 | Resp 16 | Ht 62.25 in | Wt 105.0 lb

## 2015-07-09 DIAGNOSIS — N952 Postmenopausal atrophic vaginitis: Secondary | ICD-10-CM | POA: Diagnosis not present

## 2015-07-09 DIAGNOSIS — R6882 Decreased libido: Secondary | ICD-10-CM | POA: Diagnosis not present

## 2015-07-09 DIAGNOSIS — Z01419 Encounter for gynecological examination (general) (routine) without abnormal findings: Secondary | ICD-10-CM | POA: Diagnosis not present

## 2015-07-09 DIAGNOSIS — M1990 Unspecified osteoarthritis, unspecified site: Secondary | ICD-10-CM | POA: Diagnosis not present

## 2015-07-09 DIAGNOSIS — I7300 Raynaud's syndrome without gangrene: Secondary | ICD-10-CM | POA: Diagnosis not present

## 2015-07-09 DIAGNOSIS — L97221 Non-pressure chronic ulcer of left calf limited to breakdown of skin: Secondary | ICD-10-CM | POA: Diagnosis not present

## 2015-07-09 NOTE — Patient Instructions (Signed)

## 2015-07-09 NOTE — Progress Notes (Signed)
81 y.o. G71P2001 Married  Caucasian Fe here for annual exam. Menopausal on ERT and desires continuance. Denies vaginal bleeding. Using Premarin for vaginal dryness, working well. Testosterone still working well for libido and desires continuance. No health issues or health changes in the past year. Has appointment with PCP in 2 weeks for screening labs and aex. Requests help with scheduling Mammogram and BMD. Had a slip on water and had injury to skin on left leg with wound care for the past 3 months, all healed now. Sees dermatology yearly for skin check with some basal cancer removed again. No other health concerns today.  No LMP recorded. Patient has had a hysterectomy.          Sexually active: Yes.    The current method of family planning is status post hysterectomy.    Exercising: Yes.    waling, weights, yoga Smoker:  no  Health Maintenance: Pap:  7/09 neg MMG: 06-01-14 category b density birads 2:neg Colonoscopy: 2013 f/u 63yrs BMD:  2012 TDaP:  2010 Labs: pcp Self breast exam: done occ   reports that she has quit smoking. Her smoking use included Cigarettes. She has a 45 pack-year smoking history. She has never used smokeless tobacco. She reports that she drinks alcohol. She reports that she does not use illicit drugs.  Past Medical History  Diagnosis Date  . GERD (gastroesophageal reflux disease)   . Rotator cuff tear, left   . Arthritis     "osteoarthritis"  . Shortness of breath 08/28/11    "hard time breathing deeply because of the pain"  . Diarrhea 08/28/11    "for the last 17 days; from [place] poisoning"  . Headache(784.0) 08/28/11    "just for the last few days; releated to dehydration"  . Hearing aid worn     Past Surgical History  Procedure Laterality Date  . Back surgery  1/06    "spinal stenosis"  . Facial cosmetic surgery  ~ 2002  . Finger surgery  ~ 2000    "titanium rod in right pointer; from arthritis"  . Tonsillectomy  1943  . Tubal ligation  1970's   . Cataract extraction w/ intraocular lens  implant, bilateral  Summer 2011  . Vaginal hysterectomy  2/04    "still have my ovaries"    Current Outpatient Prescriptions  Medication Sig Dispense Refill  . Biotin 10 MG TABS Take by mouth daily.    . Calcium Citrate (CITRACAL PO) Take 1 tablet by mouth daily.    . Coenzyme Q10 (CO Q 10 PO) Take by mouth daily.    Marland Kitchen estradiol (VIVELLE-DOT) 0.0375 MG/24HR PLACE 1 PATCH ONTO THE SKIN TWO TIMES A WEEK ON MONDAYS AND FRIDAYS 8 patch 0  . meloxicam (MOBIC) 15 MG tablet Take 7.5 mg by mouth daily.     . Multiple Vitamin (MULTIVITAMIN) tablet Take 1 tablet by mouth daily.    . NONFORMULARY OR COMPOUNDED ITEM TESTOSTERONE PROPIONATE PETROLATUM (JAR)  APPLY ONE TEAR DROP SIZE AMOUNT TO VAGINAL AREA AT BEDTIME TWO TIMES WEEKLY. 60 each 1  . OVER THE COUNTER MEDICATION Place 1 drop into both eyes 2 (two) times daily as needed (redness/ dry eyes). Over the counter eye drops    . OVER THE COUNTER MEDICATION Take 2 tablets by mouth at bedtime as needed (sleep). Aleve PM    . pantoprazole (PROTONIX) 40 MG tablet Take 40 mg by mouth 2 (two) times daily.     Marland Kitchen PREMARIN vaginal cream PLACE VAGINALLY TWICE  WEEKLY AS DIRECTED 30 g 0  . zolpidem (AMBIEN) 5 MG tablet Take 1 tablet (5 mg total) by mouth at bedtime as needed for sleep (insomnia). 15 tablet 0   No current facility-administered medications for this visit.    Family History  Problem Relation Age of Onset  . Cancer Father     colon    ROS:  Pertinent items are noted in HPI.  Otherwise, a comprehensive ROS was negative.  Exam:   BP 118/62 mmHg  Pulse 72  Resp 16  Ht 5' 2.25" (1.581 m)  Wt 105 lb (47.628 kg)  BMI 19.05 kg/m2 Height: 5' 2.25" (158.1 cm) Ht Readings from Last 3 Encounters:  07/09/15 5' 2.25" (1.581 m)  05/07/14 5' 2.5" (1.588 m)  05/06/13 5\' 3"  (1.6 m)    General appearance: alert, cooperative and appears stated age Head: Normocephalic, without obvious abnormality,  atraumatic Neck: no adenopathy, supple, symmetrical, trachea midline and thyroid normal to inspection and palpation Lungs: clear to auscultation bilaterally Breasts: normal appearance, no masses or tenderness, No nipple retraction or dimpling, No nipple discharge or bleeding, No axillary or supraclavicular adenopathy Heart: regular rate and rhythm Abdomen: soft, non-tender; no masses,  no organomegaly Extremities: extremities normal, atraumatic, no cyanosis or edema Skin: Skin color, texture, turgor normal. No rashes or lesions Lymph nodes: Cervical, supraclavicular, and axillary nodes normal. No abnormal inguinal nodes palpated Neurologic: Grossly normal   Pelvic: External genitalia:  no lesions              Urethra:  normal appearing urethra with no masses, tenderness or lesions              Bartholin's and Skene's: normal                 Vagina: normal appearing vagina with normal color and discharge, no lesions              Cervix: absent              Pap taken: No. Bimanual Exam:  Uterus:  uterus absent              Adnexa: normal adnexa and no mass, fullness, tenderness               Rectovaginal: Confirms               Anus:  normal sphincter tone, no lesions  Chaperone present: yes  A:  Well Woman with normal exam  Menopausal on ERT s/p TVH for prolapse, ovaries retained  Atrophic vaginitis Premarin cream working well desires continuance, uses 2 x weekly  Decrease libido, testosterone cream working well use only once weekly, needs testosterone level today  Mammogram and BMD due  P:   Reviewed health and wellness pertinent to exam  Discussed ERT and WHI information and long term estrogen use and increase of cardiovascular changes and breast cancer risk. Patient has no brain fog, feels great and feels this contributes to her well being.  Aware she needs mammogram before refill given. Patient and provider agree if she continues in good health has no health changes to continue  but decrease dose.  Rx Premarin cream see order  Will renew testosterone if lab normal  Lab testosterone  Discussed need to schedule mammogram and BMD, will schedule for her and advise of information.  Pap smear as above not taken   counseled on breast self exam, mammography screening, adequate intake of calcium and vitamin D, diet and  exercise  return annually or prn  An After Visit Summary was printed and given to the patient.

## 2015-07-09 NOTE — Progress Notes (Signed)
Agree with continuing to encouraged lowest dosage of HRT.  Reviewed personally.  Felipa Emory, MD.

## 2015-07-10 LAB — TESTOSTERONE: Testosterone: 43 ng/dL (ref 10–70)

## 2015-07-13 ENCOUNTER — Telehealth: Payer: Self-pay

## 2015-07-13 NOTE — Telephone Encounter (Signed)
lmtcb

## 2015-07-13 NOTE — Telephone Encounter (Signed)
Left message to call [place] at [phone].  Need to advised I have scheduled her mammogram and BMD for May 1st 2017 at 3:30 pm at [facility] mammography.

## 2015-07-13 NOTE — Telephone Encounter (Signed)
-----   Message from Regina Eck, CNM sent at 07/13/2015  7:15 AM EDT ----- Notify testosterone level normal Rx refilled

## 2015-07-14 NOTE — Telephone Encounter (Signed)
Spoke with patient. Advised of appointment date and time as seen below for mammogram and BMD on May 1st 2017 at 3:30 pm at [facility]. States she needs a mammogram before then and will call to schedule that appointment. Will keep appointment for BMD on May 1st 2017. Patient was notified of normal testosterone level by Joy on 07/14/2015. Please see result note.  Routing to provider for final review. Patient agreeable to disposition. Will close encounter.

## 2015-07-26 ENCOUNTER — Other Ambulatory Visit: Payer: Self-pay | Admitting: *Deleted

## 2015-07-26 ENCOUNTER — Other Ambulatory Visit: Payer: Self-pay | Admitting: Certified Nurse Midwife

## 2015-07-26 DIAGNOSIS — Z780 Asymptomatic menopausal state: Secondary | ICD-10-CM

## 2015-07-26 MED ORDER — ESTRADIOL 0.025 MG/24HR TD PTTW: MEDICATED_PATCH | TRANSDERMAL | Status: DC

## 2015-07-26 NOTE — Telephone Encounter (Signed)
Medication refill request: Vivelle Dot Last AEX:  07/09/15 DL Next AEX: 07/12/16 DL Last MMG (if hormonal medication request): 06/01/14 BIRADS2:Benign  Refill authorized: 06/22/15 #8patch/0R. Today please advise.  Called patient. Has MMG appt on 08/11/15. Will need refills until then.  Ok to wait for Mrs [name] tomorrow. 07/27/15

## 2015-08-23 ENCOUNTER — Ambulatory Visit
Admission: RE | Admit: 2015-08-23 | Discharge: 2015-08-23 | Disposition: A | Discharge status: Home / Self Care | Payer: PPO | Source: Ambulatory Visit | Attending: Family Medicine | Admitting: Family Medicine

## 2015-08-23 ENCOUNTER — Other Ambulatory Visit: Payer: Self-pay | Admitting: Family Medicine

## 2015-08-23 DIAGNOSIS — T1490XA Injury, unspecified, initial encounter: Secondary | ICD-10-CM

## 2015-08-24 ENCOUNTER — Other Ambulatory Visit: Payer: Self-pay | Admitting: Certified Nurse Midwife

## 2015-08-24 DIAGNOSIS — Z780 Asymptomatic menopausal state: Secondary | ICD-10-CM

## 2015-08-24 MED ORDER — ESTRADIOL 0.025 MG/24HR TD PTTW: MEDICATED_PATCH | TRANSDERMAL | Status: DC

## 2015-08-24 NOTE — Telephone Encounter (Signed)
Medication refill request: Vivelle patch Last AEX:  07-09-15 Next AEX: 07-12-16 Last MMG (if hormonal medication request): 08-11-15 WNl Refill authorized: please advise

## 2015-08-30 ENCOUNTER — Other Ambulatory Visit: Payer: Self-pay | Admitting: *Deleted

## 2015-08-30 DIAGNOSIS — R6882 Decreased libido: Secondary | ICD-10-CM

## 2015-08-30 MED ORDER — NONFORMULARY OR COMPOUNDED ITEM: Status: DC

## 2015-08-30 NOTE — Telephone Encounter (Signed)
Rx faxed to [facility] today

## 2015-08-30 NOTE — Telephone Encounter (Signed)
Medication refill request: testosterone  Last AEX:  07/09/15 DL Next AEX: 07/12/16 DL Last MMG (if hormonal medication request): 08/11/15 BIRADS2:benign  Refill authorized: 01/04/15 #60each/1R. Today please advise.

## 2015-09-20 ENCOUNTER — Other Ambulatory Visit: Payer: Self-pay | Admitting: Nurse Practitioner

## 2015-09-20 NOTE — Telephone Encounter (Signed)
Medication refill request: Premarin Last AEX:  07/09/15 DL Next AEX: 07/12/16 DL Last MMG (if hormonal medication request): 08/11/15-Category B; Composed of scattered fibroglandular density and Benign Vascular calcifications in both breasts. Refill authorized: 06/07/15 #30g. 0refills. Please Advise.

## 2015-10-21 DIAGNOSIS — R42 Dizziness and giddiness: Secondary | ICD-10-CM | POA: Diagnosis not present

## 2015-11-17 DIAGNOSIS — Z961 Presence of intraocular lens: Secondary | ICD-10-CM | POA: Diagnosis not present

## 2015-11-17 DIAGNOSIS — H04123 Dry eye syndrome of bilateral lacrimal glands: Secondary | ICD-10-CM | POA: Diagnosis not present

## 2015-11-17 DIAGNOSIS — H5213 Myopia, bilateral: Secondary | ICD-10-CM | POA: Diagnosis not present

## 2016-01-03 DIAGNOSIS — Z85828 Personal history of other malignant neoplasm of skin: Secondary | ICD-10-CM | POA: Diagnosis not present

## 2016-01-03 DIAGNOSIS — L853 Xerosis cutis: Secondary | ICD-10-CM | POA: Diagnosis not present

## 2016-01-17 DIAGNOSIS — M154 Erosive (osteo)arthritis: Secondary | ICD-10-CM | POA: Diagnosis not present

## 2016-01-17 DIAGNOSIS — M859 Disorder of bone density and structure, unspecified: Secondary | ICD-10-CM | POA: Diagnosis not present

## 2016-01-17 DIAGNOSIS — M4690 Unspecified inflammatory spondylopathy, site unspecified: Secondary | ICD-10-CM | POA: Diagnosis not present

## 2016-01-17 DIAGNOSIS — Z791 Long term (current) use of non-steroidal anti-inflammatories (NSAID): Secondary | ICD-10-CM | POA: Diagnosis not present

## 2016-01-17 DIAGNOSIS — Z1589 Genetic susceptibility to other disease: Secondary | ICD-10-CM | POA: Diagnosis not present

## 2016-01-17 DIAGNOSIS — Z780 Asymptomatic menopausal state: Secondary | ICD-10-CM | POA: Diagnosis not present

## 2016-05-10 DIAGNOSIS — L570 Actinic keratosis: Secondary | ICD-10-CM | POA: Diagnosis not present

## 2016-05-10 DIAGNOSIS — L820 Inflamed seborrheic keratosis: Secondary | ICD-10-CM | POA: Diagnosis not present

## 2016-05-10 DIAGNOSIS — L738 Other specified follicular disorders: Secondary | ICD-10-CM | POA: Diagnosis not present

## 2016-05-10 DIAGNOSIS — Z85828 Personal history of other malignant neoplasm of skin: Secondary | ICD-10-CM | POA: Diagnosis not present

## 2016-05-11 ENCOUNTER — Other Ambulatory Visit: Payer: Self-pay

## 2016-05-11 DIAGNOSIS — R6882 Decreased libido: Secondary | ICD-10-CM

## 2016-05-11 NOTE — Telephone Encounter (Signed)
Medication refill request: Testosterone Propionate Petrolatum 2% Last AEX:  07/13/15 PG Next AEX: 07/12/16 Last MMG (if hormonal medication request): 08/11/15 BIRADS2 benign Refill authorized: 08/30/15 #60each w/1 refill; today #60each  w/1 refill?

## 2016-05-12 ENCOUNTER — Telehealth: Payer: Self-pay | Admitting: Certified Nurse Midwife

## 2016-05-12 MED ORDER — NONFORMULARY OR COMPOUNDED ITEM: 0 refills | Status: DC

## 2016-05-12 NOTE — Telephone Encounter (Signed)
Faxed Rx to [facility].  Call to patient to advise. No answer. Unable to leave message.

## 2016-05-12 NOTE — Telephone Encounter (Signed)
[facility] calling regarding "testosterone and petroleum jelly compound" prescription sent to them.   They are unable to fill this prescription.

## 2016-05-29 DIAGNOSIS — B351 Tinea unguium: Secondary | ICD-10-CM | POA: Diagnosis not present

## 2016-05-29 DIAGNOSIS — M79676 Pain in unspecified toe(s): Secondary | ICD-10-CM | POA: Diagnosis not present

## 2016-07-03 DIAGNOSIS — J310 Chronic rhinitis: Secondary | ICD-10-CM | POA: Diagnosis not present

## 2016-07-03 DIAGNOSIS — Z87891 Personal history of nicotine dependence: Secondary | ICD-10-CM | POA: Diagnosis not present

## 2016-07-03 DIAGNOSIS — H6123 Impacted cerumen, bilateral: Secondary | ICD-10-CM | POA: Insufficient documentation

## 2016-07-12 ENCOUNTER — Ambulatory Visit (INDEPENDENT_AMBULATORY_CARE_PROVIDER_SITE_OTHER): Payer: PPO | Admitting: Certified Nurse Midwife

## 2016-07-12 ENCOUNTER — Encounter: Payer: Self-pay | Admitting: Certified Nurse Midwife

## 2016-07-12 VITALS — BP 104/62 | HR 64 | Resp 16 | Ht 62.25 in | Wt 102.0 lb

## 2016-07-12 DIAGNOSIS — Z01419 Encounter for gynecological examination (general) (routine) without abnormal findings: Secondary | ICD-10-CM | POA: Diagnosis not present

## 2016-07-12 DIAGNOSIS — R6882 Decreased libido: Secondary | ICD-10-CM

## 2016-07-12 DIAGNOSIS — Z0000 Encounter for general adult medical examination without abnormal findings: Secondary | ICD-10-CM

## 2016-07-12 DIAGNOSIS — N952 Postmenopausal atrophic vaginitis: Secondary | ICD-10-CM

## 2016-07-12 DIAGNOSIS — Z780 Asymptomatic menopausal state: Secondary | ICD-10-CM | POA: Diagnosis not present

## 2016-07-12 LAB — POCT URINALYSIS DIPSTICK
Bilirubin, UA: NEGATIVE
GLUCOSE UA: NEGATIVE
Ketones, UA: NEGATIVE
Leukocytes, UA: NEGATIVE
Nitrite, UA: NEGATIVE
PROTEIN UA: NEGATIVE
RBC UA: NEGATIVE
UROBILINOGEN UA: NEGATIVE
pH, UA: 5

## 2016-07-12 MED ORDER — ESTRADIOL 0.025 MG/24HR TD PTWK: 0.0250 mg | MEDICATED_PATCH | Freq: Once | TRANSDERMAL | 12 refills | Status: DC

## 2016-07-12 MED ORDER — ESTROGENS, CONJUGATED 0.625 MG/GM VA CREA: TOPICAL_CREAM | VAGINAL | 2 refills | Status: DC

## 2016-07-12 NOTE — Patient Instructions (Signed)

## 2016-07-12 NOTE — Progress Notes (Signed)
82 y.o. G31P2001 Married  Caucasian Fe here for annual exam. Menopausal no vaginal bleeding or vaginal dryness. Continues Premarin cream twice weekly for vaginal dryness. Sexually active, no concerns. Testosterone cream working well for libido. Continues with Vivelle dot, no problems and desires continuance to maintain bone health. Sees PCP yearly for labs, all normal, and medication management of Arthritis and GERD. No new heath issues or changes this past year. Walks 15 miles/week with spouse, travels at least 4-6 months out of the year. Recent return from [place] and [place] cruise. Will be 83 this year! Still drives and has not issues with eye sight. Ready for another good year.   No LMP recorded. Patient has had a hysterectomy.          Sexually active: Yes.    The current method of family planning is status post hysterectomy.    Exercising: Yes.    walking,weights & yoga Smoker:  no  Health Maintenance: Pap:  7/09 neg MMG:  08-07-15 category b density birads 2:neg Colonoscopy:  2013 f/u 24yrs BMD:   2017 TDaP:  2010 Shingles: had done Pneumonia: had done Hep C and HIV: not done Labs: poct urine-neg Self breast exam: done occ   reports that she has quit smoking. Her smoking use included Cigarettes. She has a 45.00 pack-year smoking history. She has never used smokeless tobacco. She reports that she drinks alcohol. She reports that she does not use drugs.  Past Medical History:  Diagnosis Date  . Arthritis    "osteoarthritis"  . Diarrhea 08/28/11   "for the last 17 days; from [place] poisoning"  . GERD (gastroesophageal reflux disease)   . Headache(784.0) 08/28/11   "just for the last few days; releated to dehydration"  . Hearing aid worn   . Rotator cuff tear, left   . Shortness of breath 08/28/11   "hard time breathing deeply because of the pain"    Past Surgical History:  Procedure Laterality Date  . BACK SURGERY  1/06   "spinal stenosis"  . CATARACT EXTRACTION  W/ INTRAOCULAR LENS  IMPLANT, BILATERAL  Summer 2011  . FACIAL COSMETIC SURGERY  ~ 2002  . FINGER SURGERY  ~ 2000   "titanium rod in right pointer; from arthritis"  . TONSILLECTOMY  1943  . TUBAL LIGATION  1970's  . VAGINAL HYSTERECTOMY  2/04   "still have my ovaries"    Current Outpatient Prescriptions  Medication Sig Dispense Refill  . Biotin 10 MG TABS Take by mouth daily.    . Calcium Citrate (CITRACAL PO) Take 1 tablet by mouth daily.    . Coenzyme Q10 (CO Q 10 PO) Take by mouth daily.    Marland Kitchen estradiol (VIVELLE-DOT) 0.025 MG/24HR Apply patch on Monday and Friday twice weekly 8 patch 11  . meloxicam (MOBIC) 15 MG tablet Take 7.5 mg by mouth daily.     . Multiple Vitamin (MULTIVITAMIN) tablet Take 1 tablet by mouth daily.    . NONFORMULARY OR COMPOUNDED ITEM TESTOSTERONE PROPIONATE PETROLATUM (JAR)  APPLY ONE TEAR DROP SIZE AMOUNT TO VAGINAL AREA AT BEDTIME TWO TIMES WEEKLY. 60 each 0  . OVER THE COUNTER MEDICATION Place 1 drop into both eyes 2 (two) times daily as needed (redness/ dry eyes). Over the counter eye drops    . OVER THE COUNTER MEDICATION Take 2 tablets by mouth at bedtime as needed (sleep). Aleve PM    . pantoprazole (PROTONIX) 40 MG tablet Take 40 mg by mouth 2 (two) times daily.     Marland Kitchen  PREMARIN vaginal cream PLACE VAGINALLY 2 TIMES A WEEK AS DIRECTED 30 g 2  . zolpidem (AMBIEN) 5 MG tablet Take 1 tablet (5 mg total) by mouth at bedtime as needed for sleep (insomnia). 15 tablet 0   No current facility-administered medications for this visit.     Family History  Problem Relation Age of Onset  . Cancer Father     colon    ROS:  Pertinent items are noted in HPI.  Otherwise, a comprehensive ROS was negative.  Exam:   There were no vitals taken for this visit.   Ht Readings from Last 3 Encounters:  07/09/15 5' 2.25" (1.581 m)  05/07/14 5' 2.5" (1.588 m)  05/06/13 5\' 3"  (1.6 m)    General appearance: alert, cooperative and appears stated age Head: Normocephalic,  without obvious abnormality, atraumatic Neck: no adenopathy, supple, symmetrical, trachea midline and thyroid normal to inspection and palpation Lungs: clear to auscultation bilaterally Breasts: normal appearance, no masses or tenderness, No nipple retraction or dimpling, No nipple discharge or bleeding, No axillary or supraclavicular adenopathy Heart: regular rate and rhythm Abdomen: soft, non-tender; no masses,  no organomegaly Extremities: extremities normal, atraumatic, no cyanosis or edema Skin: Skin color, texture, turgor normal. No rashes or lesions Lymph nodes: Cervical, supraclavicular, and axillary nodes normal. No abnormal inguinal nodes palpated Neurologic: Grossly normal   Pelvic: External genitalia:  no lesions              Urethra:  normal appearing urethra with no masses, tenderness or lesions              Bartholin's and Skene's: normal                 Vagina: normal appearing vagina with normal color and discharge, no lesions              Cervix: absent              Pap taken: No. Bimanual Exam:  Uterus:  uterus absent              Adnexa: normal adnexa and no mass, fullness, tenderness               Rectovaginal: Confirms               Anus:  normal sphincter tone, no lesions  Chaperone present: yes  A:  Well Woman with normal exam  Menopausal on ERT desires continuance, no cardiovascular changes or health changes, BMD report requested from PCP, along with labs. S/P TVH with ovaries retained  Atrophic Vaginitis with Premarin vaginal cream use, working well with 2 x weekly  Decrease libido with improvement with small amount of testosterone cream prn. GERD and arthritis management with PCP  P:   Reviewed health and wellness pertinent to exam  Discussed risks and benefits of ERT and premarin vaginal cream use. Discussed WHI recommendations of appropriate use and increase risk of cardiovascular changes and risk of breast cancer with use. Patient is aware this  information "discuss it with her every year". Patient quality of life is very important and desires continuance with the knowledge of the risks. Patient and provider agree to continue use if she continues routine mammograms and SBE and reports any changes. Reports any health changes with PCP or hospitalization. Warning signs with ERT and premarin use reviewed and need to advise.  Rx Vivelle dot see order  Rx Premarin vaginal cream see order twice weekly only  Discussed lab draw  of testosterone level and then will consider renewal. Patient aware and still has some left. Will call her with results and information on use when lab in.  Lab Testosterone  Continue follow up with PCP as indicated.  Pap smear as above not taken    counseled on breast self exam, mammography screening, adequate intake of calcium and vitamin D, diet and exercise, Kegel's exercises  return annually or prn  An After Visit Summary was printed and given to the patient.

## 2016-07-13 LAB — TESTOSTERONE, TOTAL, LC/MS/MS: TESTOSTERONE, TOTAL, LC-MS-MS: 26 ng/dL (ref 2–45)

## 2016-07-14 ENCOUNTER — Other Ambulatory Visit: Payer: Self-pay | Admitting: Certified Nurse Midwife

## 2016-07-14 DIAGNOSIS — R6882 Decreased libido: Secondary | ICD-10-CM

## 2016-07-14 MED ORDER — NONFORMULARY OR COMPOUNDED ITEM: 1 refills | Status: DC

## 2016-07-14 NOTE — Progress Notes (Signed)
Encounter reviewed Jimmy Plessinger, MD   

## 2016-07-17 DIAGNOSIS — Z791 Long term (current) use of non-steroidal anti-inflammatories (NSAID): Secondary | ICD-10-CM | POA: Diagnosis not present

## 2016-07-17 DIAGNOSIS — M79641 Pain in right hand: Secondary | ICD-10-CM | POA: Diagnosis not present

## 2016-07-17 DIAGNOSIS — M154 Erosive (osteo)arthritis: Secondary | ICD-10-CM | POA: Diagnosis not present

## 2016-07-17 DIAGNOSIS — M79642 Pain in left hand: Secondary | ICD-10-CM | POA: Diagnosis not present

## 2016-07-17 DIAGNOSIS — M4690 Unspecified inflammatory spondylopathy, site unspecified: Secondary | ICD-10-CM | POA: Diagnosis not present

## 2016-07-17 DIAGNOSIS — Z1589 Genetic susceptibility to other disease: Secondary | ICD-10-CM | POA: Diagnosis not present

## 2016-07-26 ENCOUNTER — Other Ambulatory Visit: Payer: Self-pay | Admitting: Certified Nurse Midwife

## 2016-07-26 DIAGNOSIS — Z780 Asymptomatic menopausal state: Secondary | ICD-10-CM

## 2016-08-18 ENCOUNTER — Telehealth: Payer: Self-pay | Admitting: Certified Nurse Midwife

## 2016-08-18 NOTE — Telephone Encounter (Signed)
[name], CNM -see patient message below and advise?  

## 2016-08-18 NOTE — Telephone Encounter (Signed)
Patient called and said, "I am not happy with my new hormone patch. It's big and ugly and it won't stay on.  I was taking the smaller patches and I'd like to go back to that." Pharmacy on file is correct.

## 2016-08-19 ENCOUNTER — Other Ambulatory Visit: Payer: Self-pay | Admitting: Certified Nurse Midwife

## 2016-08-19 MED ORDER — ESTRADIOL 0.025 MG/24HR TD PTTW: 1.0000 | MEDICATED_PATCH | TRANSDERMAL | 12 refills | Status: DC

## 2016-08-19 NOTE — Telephone Encounter (Signed)
Order placed for Vivelle dot and Climara cancelled. Patient needs to be notified.

## 2016-08-19 NOTE — Progress Notes (Signed)
Patient unhappy with large generic patch for ERT. Cancelled order for Climara and Vivelle Dot ordered. Patient to be notified.

## 2016-08-21 DIAGNOSIS — Z1231 Encounter for screening mammogram for malignant neoplasm of breast: Secondary | ICD-10-CM | POA: Diagnosis not present

## 2016-08-21 NOTE — Telephone Encounter (Signed)
Spoke with patient, advised as seen below per Deborah Leonard, CNM. Patient verbalizes understanding and is agreeable.   Routing to provider for final review. Patient is agreeable to disposition. Will close encounter.  

## 2016-10-30 DIAGNOSIS — Z23 Encounter for immunization: Secondary | ICD-10-CM | POA: Diagnosis not present

## 2016-10-30 DIAGNOSIS — M899 Disorder of bone, unspecified: Secondary | ICD-10-CM | POA: Diagnosis not present

## 2016-10-30 DIAGNOSIS — E871 Hypo-osmolality and hyponatremia: Secondary | ICD-10-CM | POA: Diagnosis not present

## 2016-10-30 DIAGNOSIS — Z1389 Encounter for screening for other disorder: Secondary | ICD-10-CM | POA: Diagnosis not present

## 2016-10-30 DIAGNOSIS — E7800 Pure hypercholesterolemia, unspecified: Secondary | ICD-10-CM | POA: Diagnosis not present

## 2016-10-30 DIAGNOSIS — K219 Gastro-esophageal reflux disease without esophagitis: Secondary | ICD-10-CM | POA: Diagnosis not present

## 2016-10-30 DIAGNOSIS — Z0000 Encounter for general adult medical examination without abnormal findings: Secondary | ICD-10-CM | POA: Diagnosis not present

## 2016-11-06 ENCOUNTER — Other Ambulatory Visit: Payer: Self-pay | Admitting: *Deleted

## 2016-11-06 DIAGNOSIS — R6882 Decreased libido: Secondary | ICD-10-CM

## 2016-11-06 MED ORDER — NONFORMULARY OR COMPOUNDED ITEM: 1 refills | Status: DC

## 2016-11-06 NOTE — Telephone Encounter (Signed)
Incoming message from [facility] requesting TESTOSTERONE PROPIONATE PETROLATUM (JAR)  Medication refill request: testosterone  Last AEX:  07/12/16 DL Next AEX: 07/18/17 DL Last MMG (if hormonal medication request): 08/21/16 BIRADS2:Benign  Refill authorized: 07/14/16 #60each/1R. Today please advise.

## 2016-11-07 ENCOUNTER — Other Ambulatory Visit: Payer: Self-pay

## 2016-11-07 DIAGNOSIS — R6882 Decreased libido: Secondary | ICD-10-CM

## 2016-11-07 NOTE — Telephone Encounter (Signed)
Opened in error

## 2016-11-17 DIAGNOSIS — H5212 Myopia, left eye: Secondary | ICD-10-CM | POA: Diagnosis not present

## 2016-11-17 DIAGNOSIS — H26491 Other secondary cataract, right eye: Secondary | ICD-10-CM | POA: Diagnosis not present

## 2016-12-13 DIAGNOSIS — H903 Sensorineural hearing loss, bilateral: Secondary | ICD-10-CM | POA: Diagnosis not present

## 2016-12-14 DIAGNOSIS — H26491 Other secondary cataract, right eye: Secondary | ICD-10-CM | POA: Diagnosis not present

## 2017-01-03 DIAGNOSIS — H6123 Impacted cerumen, bilateral: Secondary | ICD-10-CM | POA: Diagnosis not present

## 2017-01-03 DIAGNOSIS — H903 Sensorineural hearing loss, bilateral: Secondary | ICD-10-CM | POA: Insufficient documentation

## 2017-01-03 DIAGNOSIS — Z87891 Personal history of nicotine dependence: Secondary | ICD-10-CM | POA: Diagnosis not present

## 2017-01-03 DIAGNOSIS — Z974 Presence of external hearing-aid: Secondary | ICD-10-CM | POA: Diagnosis not present

## 2017-01-03 DIAGNOSIS — Z7289 Other problems related to lifestyle: Secondary | ICD-10-CM | POA: Diagnosis not present

## 2017-01-09 ENCOUNTER — Ambulatory Visit (INDEPENDENT_AMBULATORY_CARE_PROVIDER_SITE_OTHER): Payer: PPO | Admitting: Certified Nurse Midwife

## 2017-01-09 ENCOUNTER — Encounter: Payer: Self-pay | Admitting: Certified Nurse Midwife

## 2017-01-09 VITALS — BP 110/70 | HR 70 | Temp 98.0°F | Resp 16 | Ht 62.25 in | Wt 105.0 lb

## 2017-01-09 DIAGNOSIS — N342 Other urethritis: Secondary | ICD-10-CM | POA: Diagnosis not present

## 2017-01-09 DIAGNOSIS — N949 Unspecified condition associated with female genital organs and menstrual cycle: Secondary | ICD-10-CM

## 2017-01-09 DIAGNOSIS — R300 Dysuria: Secondary | ICD-10-CM

## 2017-01-09 LAB — POCT URINALYSIS DIPSTICK
Bilirubin, UA: NEGATIVE
Blood, UA: NEGATIVE
GLUCOSE UA: NEGATIVE
Ketones, UA: NEGATIVE
NITRITE UA: NEGATIVE
PH UA: 5 (ref 5.0–8.0)
Protein, UA: NEGATIVE
Urobilinogen, UA: NEGATIVE (ref ?–2.0)

## 2017-01-09 MED ORDER — SULFAMETHOXAZOLE-TRIMETHOPRIM 800-160 MG PO TABS: ORAL_TABLET | ORAL | 0 refills | Status: DC

## 2017-01-09 NOTE — Patient Instructions (Signed)

## 2017-01-09 NOTE — Progress Notes (Signed)
83 y.o. Married Caucasian female G2P2001 here with complaint of UTI, with onset  on 2-3 days.. Patient complaining of  pain with urination. Patient denies fever, chills, nausea or back pain. No new personal products. Patient feels not related to sexual activity. Denies any vaginal symptoms of itching but vaginal burning, continues with premarin cream once weekly.   . Menopausal with vaginal dryness with Premarin use.. Patient not consuming adequate water intake. Drinks more tea than anything, one glass of water in am and pm at best. No urinary incontinence. She did notice urine more concentrated this am. No other health issues today.  ROS  Pertinent to HPI  O: Healthy female WDWN Affect: Normal, orientation x 3 Skin : warm and dry CVAT: negative bilateral Abdomen: positive for suprapubic tenderness  Pelvic exam: External genital area: atrophic but normal female appearance, no lesions Bladder,Urethra tender, Urethral meatus: tender, slightly red Vagina:atrophic with scant moisture noted. Affirm taken Cervix: surgically absent Uterus:surgically absent Adnexa: normal non tender, no fullness or masses   A: UTI Normal pelvic exam Poct urine-wbc 1+ Vaginal burning R/O vaginal infection Atrophic Vaginitis with Premarin Cream use  P: Reviewed findings of UTI and need for treatment. Rx: Bactrim DS see order with instructions VEL:[id number] micro, culture Reviewed warning signs and symptoms of UTI and need to advise if occurring. Encouraged to limit soda, tea, and coffee and be sure to increase water intake. Continue Premarin cream but twice weekly, can do coconut oil for dryness also. Instructions given. Lab Affirm will treat if indicated. Questions addressed.   RV prn

## 2017-01-10 DIAGNOSIS — M19012 Primary osteoarthritis, left shoulder: Secondary | ICD-10-CM | POA: Diagnosis not present

## 2017-01-10 LAB — WET PREP BY MOLECULAR PROBE
Candida species: NOT DETECTED
GARDNERELLA VAGINALIS: NOT DETECTED
Trichomonas vaginosis: NOT DETECTED

## 2017-01-10 LAB — URINALYSIS, MICROSCOPIC ONLY
BACTERIA UA: NONE SEEN [HPF]
Casts: NONE SEEN [LPF]
Crystals: NONE SEEN [HPF]
RBC / HPF: NONE SEEN RBC/HPF (ref <=2)
Yeast: NONE SEEN [HPF]

## 2017-01-10 NOTE — Progress Notes (Signed)
Encounter reviewed Nea Gittens, MD   

## 2017-01-11 LAB — URINE CULTURE

## 2017-01-15 DIAGNOSIS — Z791 Long term (current) use of non-steroidal anti-inflammatories (NSAID): Secondary | ICD-10-CM | POA: Diagnosis not present

## 2017-01-15 DIAGNOSIS — Z1589 Genetic susceptibility to other disease: Secondary | ICD-10-CM | POA: Diagnosis not present

## 2017-01-15 DIAGNOSIS — M4690 Unspecified inflammatory spondylopathy, site unspecified: Secondary | ICD-10-CM | POA: Diagnosis not present

## 2017-01-15 DIAGNOSIS — Z681 Body mass index (BMI) 19 or less, adult: Secondary | ICD-10-CM | POA: Diagnosis not present

## 2017-01-15 DIAGNOSIS — M154 Erosive (osteo)arthritis: Secondary | ICD-10-CM | POA: Diagnosis not present

## 2017-01-23 ENCOUNTER — Other Ambulatory Visit: Payer: Self-pay | Admitting: Certified Nurse Midwife

## 2017-01-23 DIAGNOSIS — R6882 Decreased libido: Secondary | ICD-10-CM

## 2017-01-23 MED ORDER — NONFORMULARY OR COMPOUNDED ITEM: 0 refills | Status: DC

## 2017-01-23 NOTE — Telephone Encounter (Signed)
Patient called and said, "My pharmacy recently requested a refill on testosterone cream and it was denied by your office. I am not sure why but I do need refills."  Pharmacy on file is correct.

## 2017-01-23 NOTE — Telephone Encounter (Signed)
Medication refill request: Nonformulary--Testosterone Propionate Petrolatum Last AEX:  07/12/16 DL Next AEX: 07/18/17 DL Last MMG (if hormonal medication request): 08/21/16 BIRADS2, Density B, [facility] Refill authorized: 11/06/16 #60 each 1R. Please advise. Thank you.

## 2017-01-23 NOTE — Telephone Encounter (Signed)
Prescription faxed to [facility] on file.

## 2017-01-27 ENCOUNTER — Ambulatory Visit (HOSPITAL_COMMUNITY)
Admission: EM | Admit: 2017-01-27 | Discharge: 2017-01-27 | Disposition: A | Discharge status: Home / Self Care | Payer: PPO | Attending: Family Medicine | Admitting: Family Medicine

## 2017-01-27 ENCOUNTER — Encounter (HOSPITAL_COMMUNITY): Payer: Self-pay | Admitting: Emergency Medicine

## 2017-01-27 DIAGNOSIS — S81812A Laceration without foreign body, left lower leg, initial encounter: Secondary | ICD-10-CM

## 2017-01-27 NOTE — Discharge Instructions (Signed)
Wound adhesive instructions given to patient

## 2017-01-27 NOTE — ED Provider Notes (Signed)
[facility]    CSN: [id number] Arrival date & time: 01/27/17  1213     History   Chief Complaint Chief Complaint  Patient presents with  . Abrasion    HPI [name] is a 83 y.o. female.   Is an 83 year old woman who tore the skin on her left shin, mid section, last night when getting out of the bathtub. She's having no significant pain, redness or discharge. The wound is still bleeding a little bit.      Past Medical History:  Diagnosis Date  . Arthritis    "osteoarthritis"  . Diarrhea 08/28/11   "for the last 17 days; from [place] poisoning"  . GERD (gastroesophageal reflux disease)   . Headache(784.0) 08/28/11   "just for the last few days; releated to dehydration"  . Hearing aid worn   . Shortness of breath 08/28/11   "hard time breathing deeply because of the pain"    Patient Active Problem List   Diagnosis Date Noted  . H/O thrombocytosis 01/29/2012  . Neck pain, acute 08/31/2011  . Arthritis of hand 08/31/2011  . Supraventricular tachycardia ([place]) 08/31/2011  . Salmonella enteritis 08/28/2011  . Muscle spasm of back 08/28/2011  . Dehydration 08/28/2011  . GERD (gastroesophageal reflux disease) 08/28/2011  . DJD (degenerative joint disease) 08/28/2011  . Rotator cuff tear, left 08/28/2011  . ARF (acute renal failure) ([place]) 08/28/2011    Past Surgical History:  Procedure Laterality Date  . BACK SURGERY  1/06   "spinal stenosis"  . CATARACT EXTRACTION W/ INTRAOCULAR LENS  IMPLANT, BILATERAL  Summer 2011  . FACIAL COSMETIC SURGERY  ~ 2002  . FINGER SURGERY  ~ 2000   "titanium rod in right pointer; from arthritis"  . TONSILLECTOMY  1943  . TUBAL LIGATION  1970's  . VAGINAL HYSTERECTOMY  2/04   "still have my ovaries"    OB History    Gravida Para Term Preterm AB Living   2 2 2     1    SAB TAB Ectopic Multiple Live Births           2       Home Medications    Prior to Admission medications   Medication Sig Start Date End  Date Taking? Authorizing Provider  B Complex Vitamins (B-COMPLEX/B-12 PO) Take by mouth daily.    Historical Provider, MD  Biotin 10 MG TABS Take by mouth daily.    Historical Provider, MD  Calcium Citrate (CITRACAL PO) Take 1 tablet by mouth daily.    Historical Provider, MD  Coenzyme Q10 (CO Q 10 PO) Take by mouth daily.    Historical Provider, MD  conjugated estrogens (PREMARIN) vaginal cream PLACE 1/2 gm  VAGINALLY 2 TIMES A WEEK AS DIRECTED 07/12/16   Regina Eck, CNM  diclofenac sodium (VOLTAREN) 1 % GEL  10/24/16   Historical Provider, MD  estradiol (VIVELLE-DOT) 0.025 MG/24HR Place 1 patch onto the skin 2 (two) times a week. 08/21/16   Regina Eck, CNM  ipratropium (ATROVENT) 0.06 % nasal spray U 2 SPRAYS IEN TID PRF RHINITIS 12/03/16   Historical Provider, MD  meloxicam (MOBIC) 15 MG tablet Take 7.5 mg by mouth daily.  04/05/13   Historical Provider, MD  Multiple Vitamin (MULTIVITAMIN) tablet Take 1 tablet by mouth daily.    Historical Provider, MD  NONFORMULARY OR COMPOUNDED ITEM TESTOSTERONE PROPIONATE PETROLATUM (JAR)  APPLY ONE TEAR DROP SIZE AMOUNT TO VAGINAL AREA AT BEDTIME TWO TIMES WEEKLY. 01/23/17  Regina Eck, CNM  OVER THE COUNTER MEDICATION Place 1 drop into both eyes 2 (two) times daily as needed (redness/ dry eyes). Over the counter eye drops    Historical Provider, MD  OVER THE COUNTER MEDICATION Take 2 tablets by mouth at bedtime as needed (sleep). Aleve PM    Historical Provider, MD  ranitidine (ZANTAC) 75 MG tablet Take 75 mg by mouth 2 (two) times daily.    Historical Provider, MD  sulfamethoxazole-trimethoprim (BACTRIM DS,SEPTRA DS) 800-160 MG tablet One tablet twice daily with glass of water 01/09/17   Regina Eck, CNM  valACYclovir (VALTREX) 1000 MG tablet  01/06/17   Historical Provider, MD  zolpidem (AMBIEN) 5 MG tablet Take 1 tablet (5 mg total) by mouth at bedtime as needed for sleep (insomnia). 09/03/11   Hosie Poisson, MD    Family  History Family History  Problem Relation Age of Onset  . Cancer Father     colon    Social History Social History  Substance Use Topics  . Smoking status: Former Smoker    Packs/day: 1.50    Years: 30.00    Types: Cigarettes  . Smokeless tobacco: Never Used     Comment: quit 30 years go   . Alcohol use Yes     Comment: 1-2 per day     Allergies   Patient has no known allergies.   Review of Systems Review of Systems  Constitutional: Negative.   HENT: Negative.   Musculoskeletal: Negative.   Skin: Positive for wound.  Neurological: Negative.   All other systems reviewed and are negative.    Physical Exam Triage Vital Signs ED Triage Vitals  Enc Vitals Group     BP 01/27/17 1325 (!) 150/77     Pulse Rate 01/27/17 1325 (!) 58     Resp 01/27/17 1325 16     Temp 01/27/17 1325 98.2 F (36.8 C)     Temp Source 01/27/17 1325 Oral     SpO2 01/27/17 1325 100 %     Weight 01/27/17 1325 105 lb (47.6 kg)     Height 01/27/17 1325 5\' 2"  (1.575 m)     Head Circumference --      Peak Flow --      Pain Score 01/27/17 1326 0     Pain Loc --      Pain Edu? --      Excl. in [place]? --    No data found.   Updated Vital Signs BP (!) 150/77   Pulse (!) 58   Temp 98.2 F (36.8 C) (Oral)   Resp 16   Ht 5\' 2"  (1.575 m)   Wt 105 lb (47.6 kg)   SpO2 100%   BMI 19.20 kg/m   Visual Acuity Right Eye Distance:   Left Eye Distance:   Bilateral Distance:    Right Eye Near:   Left Eye Near:    Bilateral Near:     Physical Exam  Constitutional: She is oriented to person, place, and time. She appears well-developed and well-nourished.  HENT:  Right Ear: External ear normal.  Left Ear: External ear normal.  Mouth/Throat: Oropharynx is clear and moist.  Eyes: Conjunctivae are normal. Pupils are equal, round, and reactive to light.  Neck: Normal range of motion. Neck supple.  Pulmonary/Chest: Effort normal.  Abdominal: Soft.  Musculoskeletal: Normal range of motion.   Neurological: She is alert and oriented to person, place, and time.  Skin: Skin is warm and dry.  Nursing note and vitals reviewed.    UC Treatments / Results  Labs (all labs ordered are listed, but only abnormal results are displayed) Labs Reviewed - No data to display  EKG  EKG Interpretation None       Radiology No results found.  Procedures .Marland KitchenLaceration Repair Date/Time: 01/27/2017 1:48 PM Performed by: Robyn Haber Authorized by: Robyn Haber   Consent:    Consent obtained:  Verbal   Consent given by:  Patient   Risks discussed:  Infection and poor cosmetic result Anesthesia (see MAR for exact dosages):    Anesthesia method:  None Laceration details:    Location:  Leg   Leg location:  L lower leg Repair type:    Repair type:  Simple Treatment:    Area cleansed with:  Shur-Clens Skin repair:    Repair method:  Tissue adhesive Approximation:    Approximation:  Close Post-procedure details:    Dressing:  Bulky dressing   Patient tolerance of procedure:  Tolerated well, no immediate complications   (including critical care time)  Medications Ordered in UC Medications - No data to display   Initial Impression / Assessment and Plan / UC Course  I have reviewed the triage vital signs and the nursing notes.  Pertinent labs & imaging results that were available during my care of the patient were reviewed by me and considered in my medical decision making (see chart for details).     Final Clinical Impressions(s) / UC Diagnoses   Final diagnoses:  Laceration of left lower leg, initial encounter    New Prescriptions New Prescriptions   No medications on file     Robyn Haber, MD 01/27/17 1350

## 2017-01-27 NOTE — ED Triage Notes (Signed)
PT has a skin tear to left shin. PT scraped it on the outer edge of her tub last night. No fall

## 2017-02-09 DIAGNOSIS — M19012 Primary osteoarthritis, left shoulder: Secondary | ICD-10-CM | POA: Diagnosis not present

## 2017-02-09 DIAGNOSIS — M79671 Pain in right foot: Secondary | ICD-10-CM | POA: Diagnosis not present

## 2017-03-09 DIAGNOSIS — M19012 Primary osteoarthritis, left shoulder: Secondary | ICD-10-CM | POA: Diagnosis not present

## 2017-03-13 DIAGNOSIS — R262 Difficulty in walking, not elsewhere classified: Secondary | ICD-10-CM | POA: Diagnosis not present

## 2017-03-13 DIAGNOSIS — M79671 Pain in right foot: Secondary | ICD-10-CM | POA: Diagnosis not present

## 2017-03-13 DIAGNOSIS — S92354S Nondisplaced fracture of fifth metatarsal bone, right foot, sequela: Secondary | ICD-10-CM | POA: Diagnosis not present

## 2017-03-14 DIAGNOSIS — R262 Difficulty in walking, not elsewhere classified: Secondary | ICD-10-CM | POA: Diagnosis not present

## 2017-03-14 DIAGNOSIS — S92354S Nondisplaced fracture of fifth metatarsal bone, right foot, sequela: Secondary | ICD-10-CM | POA: Diagnosis not present

## 2017-03-14 DIAGNOSIS — M79671 Pain in right foot: Secondary | ICD-10-CM | POA: Diagnosis not present

## 2017-03-19 DIAGNOSIS — R262 Difficulty in walking, not elsewhere classified: Secondary | ICD-10-CM | POA: Diagnosis not present

## 2017-03-19 DIAGNOSIS — M79671 Pain in right foot: Secondary | ICD-10-CM | POA: Diagnosis not present

## 2017-03-19 DIAGNOSIS — S92354S Nondisplaced fracture of fifth metatarsal bone, right foot, sequela: Secondary | ICD-10-CM | POA: Diagnosis not present

## 2017-03-20 ENCOUNTER — Other Ambulatory Visit: Payer: Self-pay | Admitting: Orthopaedic Surgery

## 2017-03-20 ENCOUNTER — Encounter (HOSPITAL_BASED_OUTPATIENT_CLINIC_OR_DEPARTMENT_OTHER): Payer: PPO | Attending: Surgery

## 2017-03-20 DIAGNOSIS — I7300 Raynaud's syndrome without gangrene: Secondary | ICD-10-CM | POA: Insufficient documentation

## 2017-03-20 DIAGNOSIS — Z79899 Other long term (current) drug therapy: Secondary | ICD-10-CM | POA: Diagnosis not present

## 2017-03-20 DIAGNOSIS — M25512 Pain in left shoulder: Secondary | ICD-10-CM

## 2017-03-20 DIAGNOSIS — Z87891 Personal history of nicotine dependence: Secondary | ICD-10-CM | POA: Insufficient documentation

## 2017-03-20 DIAGNOSIS — L97222 Non-pressure chronic ulcer of left calf with fat layer exposed: Secondary | ICD-10-CM | POA: Insufficient documentation

## 2017-03-21 DIAGNOSIS — R262 Difficulty in walking, not elsewhere classified: Secondary | ICD-10-CM | POA: Diagnosis not present

## 2017-03-21 DIAGNOSIS — M79671 Pain in right foot: Secondary | ICD-10-CM | POA: Diagnosis not present

## 2017-03-21 DIAGNOSIS — S92354S Nondisplaced fracture of fifth metatarsal bone, right foot, sequela: Secondary | ICD-10-CM | POA: Diagnosis not present

## 2017-03-22 ENCOUNTER — Encounter (HOSPITAL_BASED_OUTPATIENT_CLINIC_OR_DEPARTMENT_OTHER): Payer: PPO

## 2017-03-24 ENCOUNTER — Ambulatory Visit
Admission: RE | Admit: 2017-03-24 | Discharge: 2017-03-24 | Disposition: A | Discharge status: Home / Self Care | Payer: PPO | Source: Ambulatory Visit | Attending: Orthopaedic Surgery | Admitting: Orthopaedic Surgery

## 2017-03-24 DIAGNOSIS — M25512 Pain in left shoulder: Secondary | ICD-10-CM

## 2017-03-24 DIAGNOSIS — M25412 Effusion, left shoulder: Secondary | ICD-10-CM | POA: Diagnosis not present

## 2017-03-26 DIAGNOSIS — M79671 Pain in right foot: Secondary | ICD-10-CM | POA: Diagnosis not present

## 2017-03-26 DIAGNOSIS — R262 Difficulty in walking, not elsewhere classified: Secondary | ICD-10-CM | POA: Diagnosis not present

## 2017-03-26 DIAGNOSIS — M19012 Primary osteoarthritis, left shoulder: Secondary | ICD-10-CM | POA: Diagnosis not present

## 2017-03-26 DIAGNOSIS — S92354S Nondisplaced fracture of fifth metatarsal bone, right foot, sequela: Secondary | ICD-10-CM | POA: Diagnosis not present

## 2017-03-27 DIAGNOSIS — L97222 Non-pressure chronic ulcer of left calf with fat layer exposed: Secondary | ICD-10-CM | POA: Diagnosis not present

## 2017-03-30 DIAGNOSIS — S92354S Nondisplaced fracture of fifth metatarsal bone, right foot, sequela: Secondary | ICD-10-CM | POA: Diagnosis not present

## 2017-03-30 DIAGNOSIS — R262 Difficulty in walking, not elsewhere classified: Secondary | ICD-10-CM | POA: Diagnosis not present

## 2017-03-30 DIAGNOSIS — M79671 Pain in right foot: Secondary | ICD-10-CM | POA: Diagnosis not present

## 2017-03-31 DIAGNOSIS — M19212 Secondary osteoarthritis, left shoulder: Secondary | ICD-10-CM | POA: Diagnosis not present

## 2017-04-02 DIAGNOSIS — S92354S Nondisplaced fracture of fifth metatarsal bone, right foot, sequela: Secondary | ICD-10-CM | POA: Diagnosis not present

## 2017-04-02 DIAGNOSIS — M79671 Pain in right foot: Secondary | ICD-10-CM | POA: Diagnosis not present

## 2017-04-02 DIAGNOSIS — R262 Difficulty in walking, not elsewhere classified: Secondary | ICD-10-CM | POA: Diagnosis not present

## 2017-04-03 DIAGNOSIS — L97222 Non-pressure chronic ulcer of left calf with fat layer exposed: Secondary | ICD-10-CM | POA: Diagnosis not present

## 2017-04-03 DIAGNOSIS — L97229 Non-pressure chronic ulcer of left calf with unspecified severity: Secondary | ICD-10-CM | POA: Diagnosis not present

## 2017-04-05 DIAGNOSIS — R262 Difficulty in walking, not elsewhere classified: Secondary | ICD-10-CM | POA: Diagnosis not present

## 2017-04-05 DIAGNOSIS — S92354S Nondisplaced fracture of fifth metatarsal bone, right foot, sequela: Secondary | ICD-10-CM | POA: Diagnosis not present

## 2017-04-05 DIAGNOSIS — M79671 Pain in right foot: Secondary | ICD-10-CM | POA: Diagnosis not present

## 2017-05-04 DIAGNOSIS — L03011 Cellulitis of right finger: Secondary | ICD-10-CM | POA: Diagnosis not present

## 2017-05-04 DIAGNOSIS — Z85828 Personal history of other malignant neoplasm of skin: Secondary | ICD-10-CM | POA: Diagnosis not present

## 2017-05-25 ENCOUNTER — Other Ambulatory Visit: Payer: Self-pay | Admitting: Certified Nurse Midwife

## 2017-05-25 DIAGNOSIS — R6882 Decreased libido: Secondary | ICD-10-CM

## 2017-05-25 MED ORDER — NONFORMULARY OR COMPOUNDED ITEM: 0 refills | Status: DC

## 2017-05-25 NOTE — Telephone Encounter (Signed)
Medication refill request: testosterone propionate petrolatum (jar) Last AEX:  07-12-16 Next AEX: 07-18-17 Last MMG (if hormonal medication request): 08-21-16 category b density birads 2 Refill authorized: pt called requesting refill to last until aex. Please approve if appropriate. rx [facility]

## 2017-05-25 NOTE — Telephone Encounter (Signed)
Patient is asking for a refill of her testosterone ointment . [facility].

## 2017-05-25 NOTE — Telephone Encounter (Signed)
Patient says her prescription was sent to the wrong pharmacy.

## 2017-05-25 NOTE — Telephone Encounter (Signed)
Message left for patient to return call regarding refill request. 

## 2017-05-28 NOTE — Telephone Encounter (Signed)
Spoke with patient and notified we faxed Rx for Testosterone propionate Petrolatum to [facility].

## 2017-06-07 ENCOUNTER — Other Ambulatory Visit: Payer: Self-pay | Admitting: Certified Nurse Midwife

## 2017-06-08 NOTE — Telephone Encounter (Signed)
Medication refill request: vivelle dot 0.025mg  Last AEX:  07-12-16 Next AEX: 07-18-17 Last MMG (if hormonal medication request): 08-21-16 category b density birads 2:neg Refill authorized: pt requesting 90 day supply to get to aex. Please approve if appropriate.

## 2017-07-09 DIAGNOSIS — Z974 Presence of external hearing-aid: Secondary | ICD-10-CM | POA: Diagnosis not present

## 2017-07-09 DIAGNOSIS — Z87891 Personal history of nicotine dependence: Secondary | ICD-10-CM | POA: Diagnosis not present

## 2017-07-09 DIAGNOSIS — H6123 Impacted cerumen, bilateral: Secondary | ICD-10-CM | POA: Diagnosis not present

## 2017-07-09 DIAGNOSIS — H903 Sensorineural hearing loss, bilateral: Secondary | ICD-10-CM | POA: Diagnosis not present

## 2017-07-10 ENCOUNTER — Other Ambulatory Visit: Payer: Self-pay | Admitting: Certified Nurse Midwife

## 2017-07-10 NOTE — Telephone Encounter (Signed)
Medication refill request: Vivelle Dot patch  Last AEX:  07-12-16  Next AEX: 07-18-17 Last MMG (if hormonal medication request): 08-21-16 WNL  Refill authorized: please advise

## 2017-07-13 NOTE — Telephone Encounter (Signed)
Will refill at aex

## 2017-07-16 DIAGNOSIS — Z6820 Body mass index (BMI) 20.0-20.9, adult: Secondary | ICD-10-CM | POA: Diagnosis not present

## 2017-07-16 DIAGNOSIS — M4690 Unspecified inflammatory spondylopathy, site unspecified: Secondary | ICD-10-CM | POA: Diagnosis not present

## 2017-07-16 DIAGNOSIS — M154 Erosive (osteo)arthritis: Secondary | ICD-10-CM | POA: Diagnosis not present

## 2017-07-16 DIAGNOSIS — Z1589 Genetic susceptibility to other disease: Secondary | ICD-10-CM | POA: Diagnosis not present

## 2017-07-16 DIAGNOSIS — Z791 Long term (current) use of non-steroidal anti-inflammatories (NSAID): Secondary | ICD-10-CM | POA: Diagnosis not present

## 2017-07-18 ENCOUNTER — Ambulatory Visit: Payer: PPO | Admitting: Certified Nurse Midwife

## 2017-07-30 ENCOUNTER — Other Ambulatory Visit: Payer: Self-pay | Admitting: Certified Nurse Midwife

## 2017-07-30 DIAGNOSIS — M810 Age-related osteoporosis without current pathological fracture: Principal | ICD-10-CM

## 2017-07-30 DIAGNOSIS — Z780 Asymptomatic menopausal state: Secondary | ICD-10-CM

## 2017-07-30 NOTE — Telephone Encounter (Signed)
Medication refill request: estradiol  Last AEX:  07-12-16  Next AEX: 08-01-17  Last MMG (if hormonal medication request): 08-21-16 WNL  Refill authorized: please advise

## 2017-07-31 NOTE — Telephone Encounter (Signed)
Will refill with appt 08/01/17

## 2017-08-01 ENCOUNTER — Encounter: Payer: Self-pay | Admitting: Certified Nurse Midwife

## 2017-08-01 ENCOUNTER — Ambulatory Visit (INDEPENDENT_AMBULATORY_CARE_PROVIDER_SITE_OTHER): Payer: PPO | Admitting: Certified Nurse Midwife

## 2017-08-01 VITALS — BP 118/70 | HR 64 | Resp 14 | Ht 62.0 in | Wt 106.2 lb

## 2017-08-01 DIAGNOSIS — Z01419 Encounter for gynecological examination (general) (routine) without abnormal findings: Secondary | ICD-10-CM

## 2017-08-01 DIAGNOSIS — M8588 Other specified disorders of bone density and structure, other site: Secondary | ICD-10-CM

## 2017-08-01 DIAGNOSIS — N952 Postmenopausal atrophic vaginitis: Secondary | ICD-10-CM | POA: Diagnosis not present

## 2017-08-01 DIAGNOSIS — R6882 Decreased libido: Secondary | ICD-10-CM

## 2017-08-01 MED ORDER — ESTROGENS, CONJUGATED 0.625 MG/GM VA CREA
TOPICAL_CREAM | VAGINAL | 2 refills | Status: DC
Start: 2017-08-01 — End: 2018-08-08

## 2017-08-01 MED ORDER — ESTRADIOL 0.025 MG/24HR TD PTTW: MEDICATED_PATCH | TRANSDERMAL | 1 refills | Status: DC

## 2017-08-01 NOTE — Progress Notes (Addendum)
83 y.o. G38P2001 Married  Caucasian Fe here for annual exam. Denies vaginal bleeding. Uses Estrogen cream with good results. Mammogram due after 08/21/2017. Denies urinary incontinence only with coming in from shopping only. No stress incontinence or bowel issues. Sees Dr. Doran Stabler for aex/labs. No HSV outbreaks. Continues to use testosterone for decrease libido with good results, approximately once weekly. Continues HRT for bone health and has no health change in past year. Continues to see Specialist for Botox for facial issues. No change in labs with PCP or heart or cholesterol issues. Having left shoulder pain with Rotator cuff tear, in PT now. Took trip to [place] this past summer!  No LMP recorded. Patient has had a hysterectomy.          Sexually active: Yes.    The current method of family planning is status post hysterectomy.   Exercising: Yes.    yoga, walking, strength training Smoker:  Former smoker   Health Maintenance: Pap:  7/09 NEG History of Abnormal Pap: no MMG:  08-21-16 WNL BI RADS 2 Self Breast exams: occasionally  Colonoscopy:  2013 F/U 5 yrs per record with [facility] Marisue Humble BMD:   01-17-16 WNL TDaP:  2010 Shingles: Done Pneumonia: Done Hep C and HIV: not done Labs: discuss with provider   reports that she has quit smoking. Her smoking use included Cigarettes. She has a 45.00 pack-year smoking history. She has never used smokeless tobacco. She reports that she drinks alcohol. She reports that she does not use drugs.  Past Medical History:  Diagnosis Date  . Arthritis    "osteoarthritis"  . Diarrhea 08/28/11   "for the last 17 days; from [place] poisoning"  . GERD (gastroesophageal reflux disease)   . Headache(784.0) 08/28/11   "just for the last few days; releated to dehydration"  . Hearing aid worn   . Shortness of breath 08/28/11   "hard time breathing deeply because of the pain"    Past Surgical History:  Procedure Laterality Date  . BACK SURGERY  1/06    "spinal stenosis"  . CATARACT EXTRACTION W/ INTRAOCULAR LENS  IMPLANT, BILATERAL  Summer 2011  . FACIAL COSMETIC SURGERY  ~ 2002  . FINGER SURGERY  ~ 2000   "titanium rod in right pointer; from arthritis"  . TONSILLECTOMY  1943  . TUBAL LIGATION  1970's  . VAGINAL HYSTERECTOMY  2/04   "still have my ovaries"    Current Outpatient Prescriptions  Medication Sig Dispense Refill  . B Complex Vitamins (B-COMPLEX/B-12 PO) Take by mouth daily.    . Biotin 10 MG TABS Take by mouth daily.    . Calcium Citrate (CITRACAL PO) Take 1 tablet by mouth daily.    . Coenzyme Q10 (CO Q 10 PO) Take by mouth daily.    Marland Kitchen conjugated estrogens (PREMARIN) vaginal cream PLACE 1/2 gm  VAGINALLY 2 TIMES A WEEK AS DIRECTED 30 g 2  . diclofenac sodium (VOLTAREN) 1 % GEL     . estradiol (VIVELLE-DOT) 0.025 MG/24HR APPLY 1 PATCH EXTERNALLY TO THE SKIN 2 TIMES A WEEK 24 patch 0  . ipratropium (ATROVENT) 0.06 % nasal spray U 2 SPRAYS IEN TID PRF RHINITIS  5  . meloxicam (MOBIC) 15 MG tablet Take 7.5 mg by mouth daily.     . Multiple Vitamin (MULTIVITAMIN) tablet Take 1 tablet by mouth daily.    . NONFORMULARY OR COMPOUNDED ITEM TESTOSTERONE PROPIONATE PETROLATUM (JAR)  APPLY ONE TEAR DROP SIZE AMOUNT TO VAGINAL AREA AT BEDTIME TWO TIMES  WEEKLY. 60 each 0  . OVER THE COUNTER MEDICATION Place 1 drop into both eyes 2 (two) times daily as needed (redness/ dry eyes). Over the counter eye drops    . OVER THE COUNTER MEDICATION Take 2 tablets by mouth at bedtime as needed (sleep). Aleve PM    . ranitidine (ZANTAC) 75 MG tablet Take 75 mg by mouth 2 (two) times daily.    . valACYclovir (VALTREX) 1000 MG tablet     . zolpidem (AMBIEN) 5 MG tablet Take 1 tablet (5 mg total) by mouth at bedtime as needed for sleep (insomnia). 15 tablet 0   No current facility-administered medications for this visit.     Family History  Problem Relation Age of Onset  . Cancer Father        colon    ROS:  Pertinent items are noted in HPI.   Otherwise, a comprehensive ROS was negative.  Exam:   BP 118/70 (BP Location: Right Arm, Patient Position: Sitting, Cuff Size: Normal)   Pulse 64   Resp 14   Ht 5\' 2"  (1.575 m)   Wt 106 lb 4 oz (48.2 kg)   BMI 19.43 kg/m  Height: 5\' 2"  (157.5 cm) Ht Readings from Last 3 Encounters:  08/01/17 5\' 2"  (1.575 m)  01/27/17 5\' 2"  (1.575 m)  01/09/17 5' 2.25" (1.581 m)    General appearance: alert, cooperative and appears stated age Head: Normocephalic, without obvious abnormality, atraumatic Neck: no adenopathy, supple, symmetrical, trachea midline and thyroid normal to inspection and palpation Lungs: clear to auscultation bilaterally Breasts: normal appearance, no masses or tenderness, No nipple retraction or dimpling, No nipple discharge or bleeding, No axillary or supraclavicular adenopathy Heart: regular rate and rhythm Abdomen: soft, non-tender; no masses,  no organomegaly Extremities: extremities normal, atraumatic, no cyanosis or edema Skin: Skin color, texture, turgor normal. No rashes or lesions Lymph nodes: Cervical, supraclavicular, and axillary nodes normal. No abnormal inguinal nodes palpated Neurologic: Grossly normal   Pelvic: External genitalia:  no lesions              Urethra:  normal appearing urethra with no masses, tenderness or lesions              Bartholin's and Skene's: normal                 Vagina: Atrophic appearing vagina with pink color and normal discharge, no lesions              Cervix: absent              Pap taken: No. Bimanual Exam:  Uterus:  uterus absent              Adnexa: normal adnexa and no mass, fullness, tenderness               Rectovaginal: Confirms               Anus:  normal sphincter tone, no lesions  Chaperone present: yes  A:  Well Woman with normal exam  Post menopausal on HRT for bone health and quality of life, desires continuance, s/p TVH with ovaries retained, prolapse. History of osteopenia and degenerative  spine.  Atrophic vaginitis with Premarin vaginal cream use once weekly, working well  Decrease libido with prn use of Testosterone cream use, no more than once or twice monthly  History of HSV, no outbreaks no Rx needed  MD management of insomnia and GERD    P:  Reviewed health and wellness pertinent to exam  Discussed risks/benefits/warning signs of HRT. Patient is aware and strongly feels that she is healthy and would like to continue use. She will advise if any cardiovascular changes with PCP or concerns and will stop HRT. Discussed at length and agree this is good decision for her at this point and will trust her to advise if health change. If surgery necessary for shoulder, will need to be off HRT, due to risk of clot. Agreeable  Rx Vivelle dot see order with instructions  Discussed benefits and risks and warning signs of estrogen cream use, will continue, having no issues for prn use. No more than twice weekly.  Rx Premarin cream see order with instructions  Discussed will need testosterone level to continue Testosterone use for libido. Patient agreeable. Will not refill until in. Stressed for prn use 1-2 x monthly. Will advise if issues.  Lab : testosterone female  Continue follow up with PCP as indicated and for labs. If any level change regarding cardiovascular health needs to stop HRT. Voiced agreement.  Pap smear: no  counseled on breast self exam, mammography screening, feminine hygiene, menopause, osteoporosis, adequate intake of calcium and vitamin D, diet and exercise return annually or prn  An After Visit Summary was printed and given to the patient.

## 2017-08-01 NOTE — Patient Instructions (Signed)

## 2017-08-03 LAB — TESTOSTERONE, TOTAL, LC/MS/MS: TESTOSTERONE, TOTAL: 22.7 ng/dL (ref 7.0–40.0)

## 2017-08-06 ENCOUNTER — Other Ambulatory Visit: Payer: Self-pay | Admitting: Certified Nurse Midwife

## 2017-08-06 DIAGNOSIS — R6882 Decreased libido: Secondary | ICD-10-CM

## 2017-08-06 MED ORDER — NONFORMULARY OR COMPOUNDED ITEM: 0 refills | Status: DC

## 2017-08-07 ENCOUNTER — Telehealth: Payer: Self-pay

## 2017-08-07 ENCOUNTER — Other Ambulatory Visit: Payer: Self-pay | Admitting: Certified Nurse Midwife

## 2017-08-07 DIAGNOSIS — R6882 Decreased libido: Secondary | ICD-10-CM

## 2017-08-07 MED ORDER — NONFORMULARY OR COMPOUNDED ITEM: 0 refills | Status: DC

## 2017-08-07 NOTE — Telephone Encounter (Signed)
lmtcb

## 2017-08-07 NOTE — Telephone Encounter (Signed)
-----   Message from [name], CNM sent at 08/06/2017  7:48 PM EDT ----- Notify patient her Testosterone level is normal.  Will refill her Rx, but use sparingly as we discussed.  Her printed Rx will  Need to be signed and faxed

## 2017-08-09 NOTE — Telephone Encounter (Signed)
Left message to call back  

## 2017-08-09 NOTE — Telephone Encounter (Signed)
Patient notified of results as written by provider & [facility] per patient request.

## 2017-08-09 NOTE — Telephone Encounter (Signed)
Patient is returning a call to Joy. °

## 2017-08-09 NOTE — Telephone Encounter (Signed)
Left message for call back.

## 2017-08-27 DIAGNOSIS — Z1231 Encounter for screening mammogram for malignant neoplasm of breast: Secondary | ICD-10-CM | POA: Diagnosis not present

## 2017-09-19 ENCOUNTER — Encounter: Payer: Self-pay | Admitting: Certified Nurse Midwife

## 2017-10-02 ENCOUNTER — Telehealth: Payer: Self-pay | Admitting: Certified Nurse Midwife

## 2017-10-02 DIAGNOSIS — M8588 Other specified disorders of bone density and structure, other site: Secondary | ICD-10-CM

## 2017-10-02 NOTE — Telephone Encounter (Signed)
Spoke with patient. Reports applying whole estradiol patch, just recently realized she was supposed to apply 1/2 of patch, started 1/2 patch 2.5 wks ago.   Patient states the 1/2 patch does not stick well, has not had problems in the past with whole patch. Currently applies on upper thigh.  Reviewed options of areas to apply patch: buttock, abdomen, upper arm and upper torso -not to include breast.   Reviewed changing patch sites, applying to clean, dry area, no lotions or oils.   Patient states she does not feel 1/2 patch will work, request possible alternative. Advised will review with Melvia Heaps, CNM and return call with recommendations, patient is agreeable.   Melvia Heaps, CNM -please advise?

## 2017-10-02 NOTE — Telephone Encounter (Signed)
Spoke with patient, advised as seen below per Melvia Heaps, CNM. Patient states she will need new RX reflecting dose change. Advised will review with Melvia Heaps, CNM and return call, patient agreeable.   Melvia Heaps, CNM -please advise on number of refills for estradiol 0.025 mg patch? Patient request 90 day supply.

## 2017-10-02 NOTE — Telephone Encounter (Signed)
Patient states she can not get her Estradiol patch to stay on when she cuts them in half.  Also says for a month she has used a whole patch because she did not read the instructions.

## 2017-10-02 NOTE — Telephone Encounter (Signed)
She can use the whole patch if she feels this will not adhere and is having no issues.

## 2017-10-03 MED ORDER — ESTRADIOL 0.025 MG/24HR TD PTTW: 1.0000 | MEDICATED_PATCH | TRANSDERMAL | 0 refills | Status: DC

## 2017-10-03 NOTE — Telephone Encounter (Signed)
90 day is agreeable, using for bone support

## 2017-10-03 NOTE — Telephone Encounter (Signed)
Rx for estradiol 0.025mg  #24/0RF placed to [facility]  Left message, advised requested Rx has been filled and sent to [facility], f/u for filling. Return call to office for any additional questions.   Routing to provider for final review. Will close encounter.

## 2017-11-26 DIAGNOSIS — Z1211 Encounter for screening for malignant neoplasm of colon: Secondary | ICD-10-CM | POA: Diagnosis not present

## 2017-11-26 DIAGNOSIS — K219 Gastro-esophageal reflux disease without esophagitis: Secondary | ICD-10-CM | POA: Diagnosis not present

## 2017-11-26 DIAGNOSIS — E7800 Pure hypercholesterolemia, unspecified: Secondary | ICD-10-CM | POA: Diagnosis not present

## 2017-11-26 DIAGNOSIS — M899 Disorder of bone, unspecified: Secondary | ICD-10-CM | POA: Diagnosis not present

## 2017-11-26 DIAGNOSIS — Z1389 Encounter for screening for other disorder: Secondary | ICD-10-CM | POA: Diagnosis not present

## 2017-11-26 DIAGNOSIS — E871 Hypo-osmolality and hyponatremia: Secondary | ICD-10-CM | POA: Diagnosis not present

## 2017-11-26 DIAGNOSIS — Z0000 Encounter for general adult medical examination without abnormal findings: Secondary | ICD-10-CM | POA: Diagnosis not present

## 2017-12-13 ENCOUNTER — Telehealth: Payer: Self-pay | Admitting: Certified Nurse Midwife

## 2017-12-13 NOTE — Telephone Encounter (Signed)
Medication refill request: TESTOSTERONE PROPIONATE PETROLATUM  Last AEX:  08-01-17  Next AEX: [phone]  Last MMG (if hormonal medication request): 08-27-17 WNL  Refill authorized: please advise

## 2017-12-13 NOTE — Telephone Encounter (Signed)
Patient is requesting a refill of testerone ointment to be called to [facility].

## 2017-12-14 ENCOUNTER — Other Ambulatory Visit: Payer: Self-pay | Admitting: Certified Nurse Midwife

## 2017-12-14 DIAGNOSIS — R6882 Decreased libido: Secondary | ICD-10-CM

## 2017-12-14 MED ORDER — NONFORMULARY OR COMPOUNDED ITEM: 0 refills | Status: DC

## 2017-12-14 NOTE — Telephone Encounter (Signed)
Printed script and will have [place] fax to pharmacy

## 2017-12-20 DIAGNOSIS — H6123 Impacted cerumen, bilateral: Secondary | ICD-10-CM | POA: Diagnosis not present

## 2018-01-05 IMAGING — MR MR SHOULDER*L* W/O CM
4 of 5 series · 14 of 40 positions shown · non-contrast
Comparison: 07/06/2011

CLINICAL DATA: Month of acute left shoulder pain superimposed on 5
years of chronic shoulder pain. No known surgery. Injections used to
help until a few weeks/months ago.

EXAM:
MRI OF THE LEFT SHOULDER WITHOUT CONTRAST
TECHNIQUE: Multiplanar, multisequence MR imaging of the shoulder was performed.
No intravenous contrast was administered.

[Series 6: T2 fat-sat · axial · left · 3.0mm · 0.44mm/px · z∈[-26,+52]mm · 3 of 28 slices shown (1 of 3)]
[im 4/28]
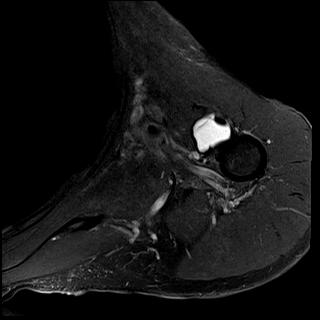
[im 16/28]
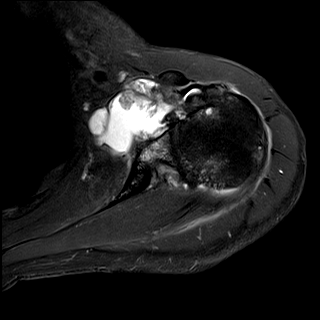
[im 25/28]
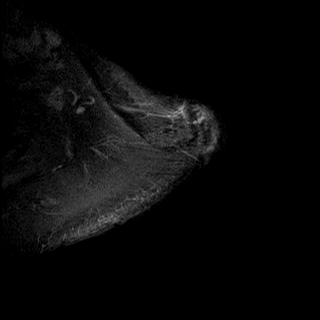

[Series 7: T2 fat-sat · oblique · left · 3.0mm · 0.44mm/px · 3 of 26 slices shown (2 of 3)]
[im 4/26]
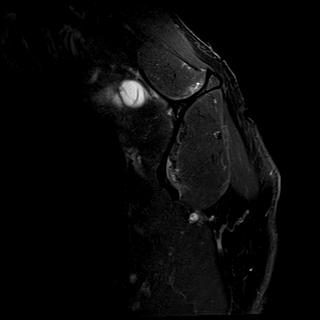
[im 15/26]
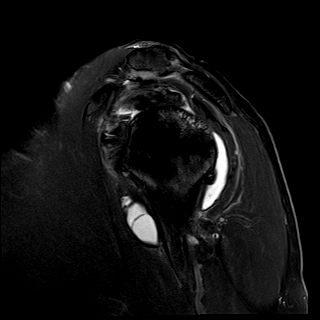
[im 22/26]
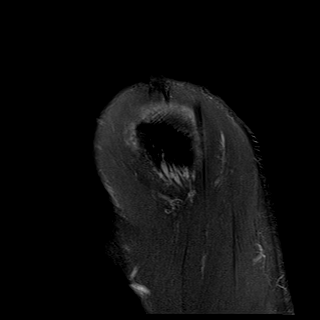

[Series 9: PD · oblique · left · 3.0mm · 0.18mm/px · 5 of 23 slices shown]
[im 1/23]
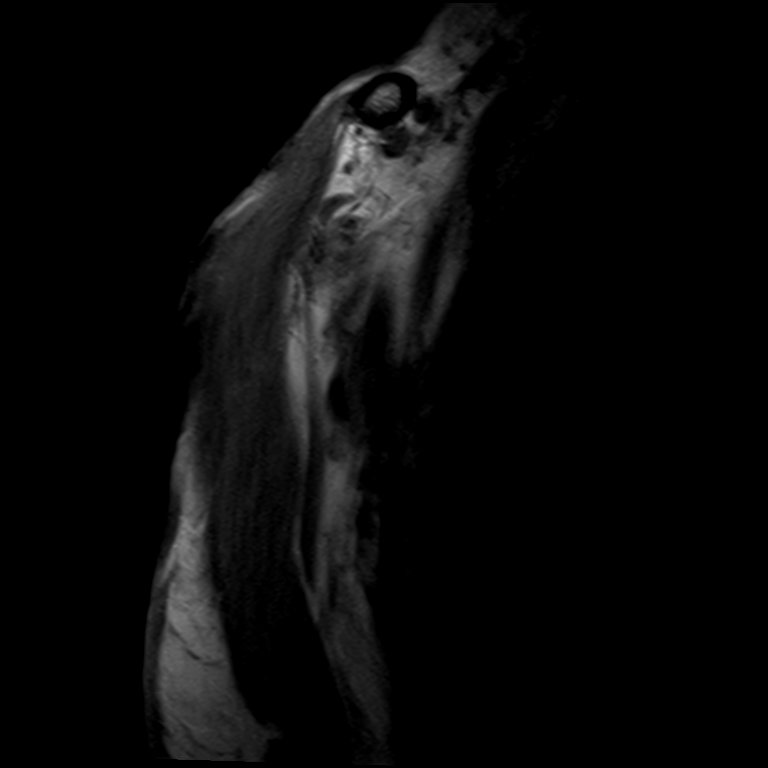
[im 4/23]
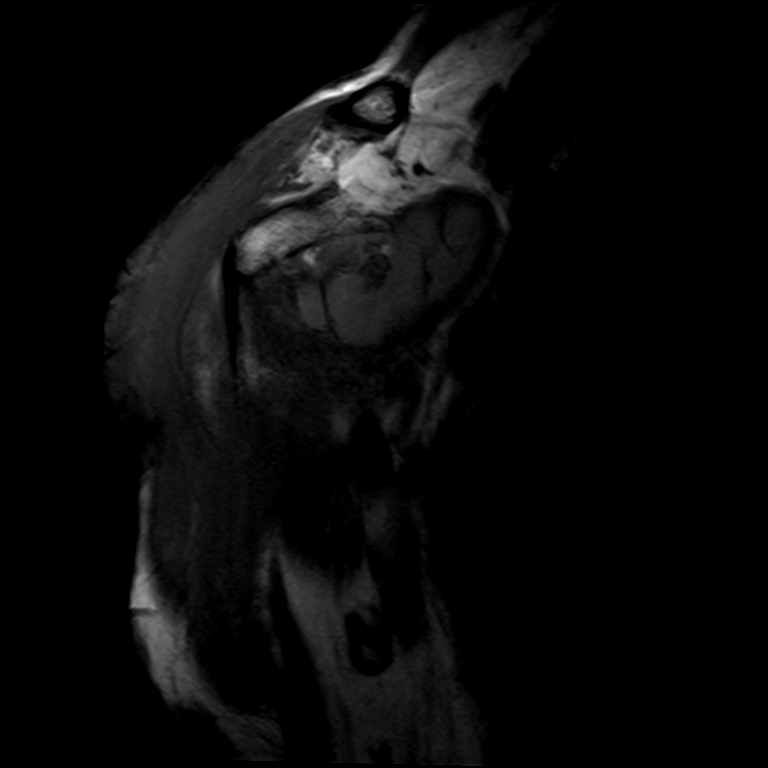
[im 8/23]
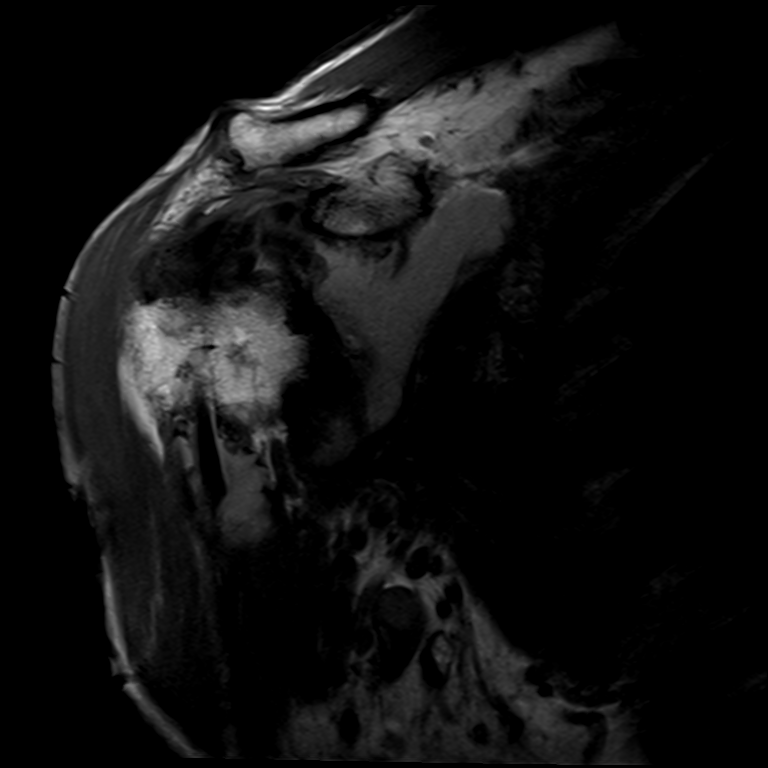
[im 12/23]
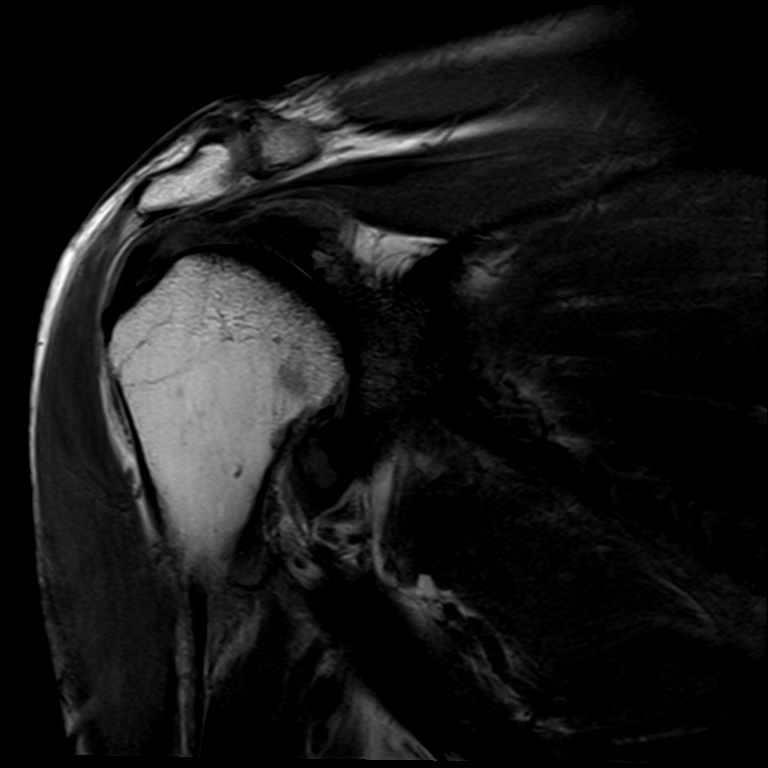
[im 19/23]
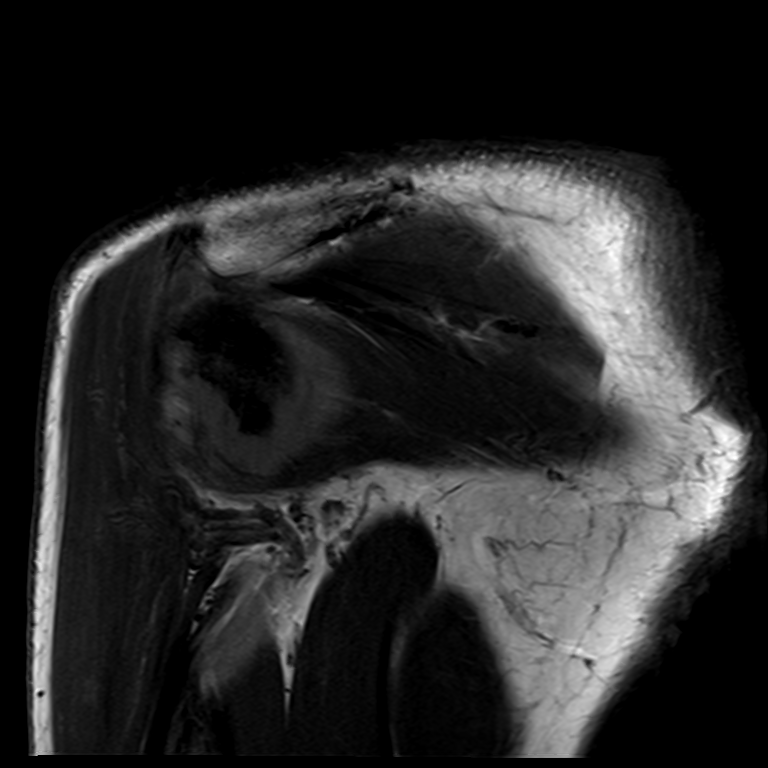

[Series 10: T2 fat-sat · oblique · left · 3.0mm · 0.22mm/px · 3 of 23 slices shown (3 of 3)]
[im 4/23]
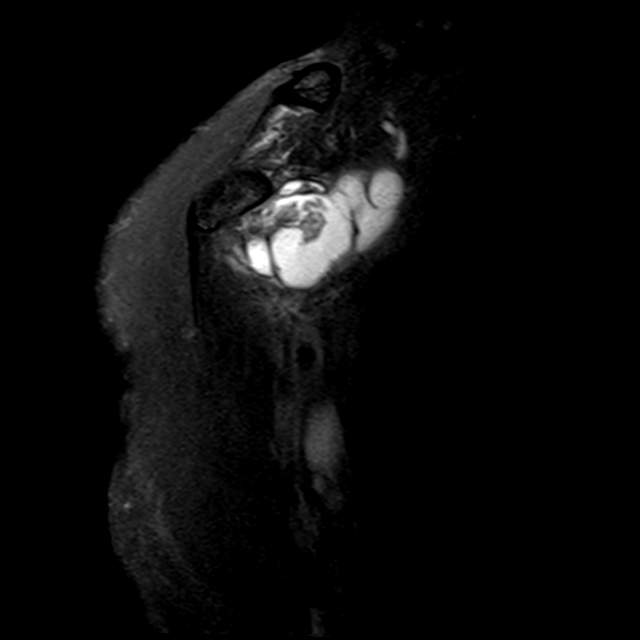
[im 12/23]
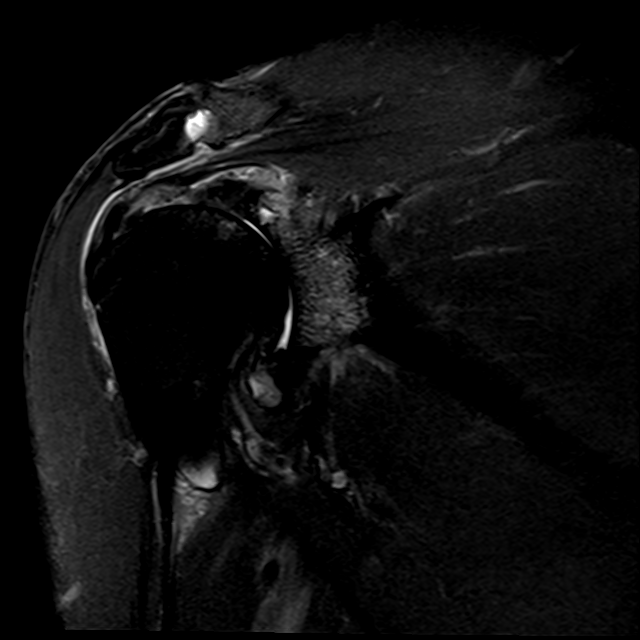
[im 19/23]
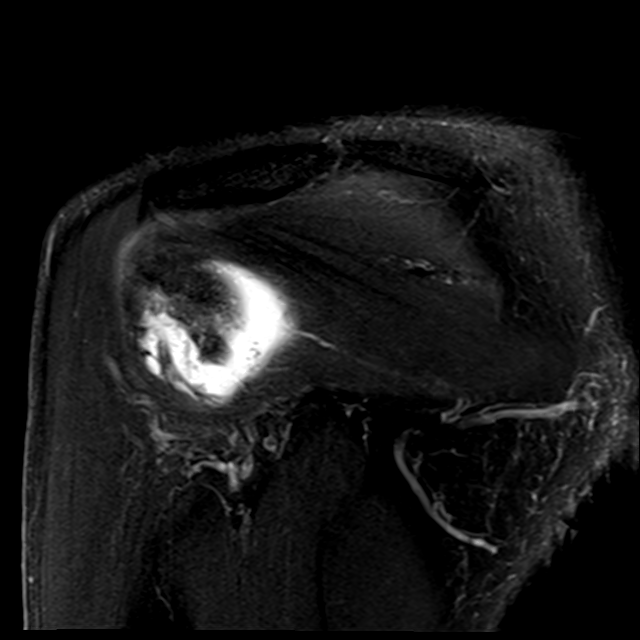

[14 of 40 positions shown; findings below may reference images not displayed]

FINDINGS: Rotator cuff: Near complete articular surface delaminating tear of
the anterior fibers of the distal supraspinatus tendon, series 10,
image 9 and series 7, image 19. Subscapularis tendinopathy. Intact
infraspinatus and teres minor.

Muscles: No atrophy or abnormal signal of the muscles of the rotator
cuff.

Biceps long head: To thickening of the proximal biceps tendon
consistent with biceps tendinopathy. No tear noted.

Acromioclavicular Joint: Mild arthropathy of the acromioclavicular
joint. Type I acromion. Trace subacromial and subdeltoid bursal
fluid.

Glenohumeral Joint: Moderate to large joint effusion with prominence
of the synovium suggesting is a component of synovitis.

Labrum: Intrasubstance degeneration of the labrum and tearing as
before.

Bones: Chondromalacia involving the glenoid and humeral head
cartilage at the glenohumeral joint with bone-on-bone apposition and
subchondral cystic change of the glenoid and humeral head. No
fracture nor dislocations.

Other: None
IMPRESSION: 1. Near complete delaminating partial articular surface tear of the
anterior fibers of the supraspinatus tendon, progressed since
previous exam. Given the small amount of subacromial and subdeltoid
bursal fluid, full-thickness microperforations are believed likely.
2. Subscapularis tendinopathy.
3. Mild AC joint osteoarthropathy.
4. Marked glenohumeral joint chondromalacia with degenerative
subchondral cystic change of the glenoid and humeral head.
5. Moderate to large glenohumeral joint effusion with mild
prominence of the synovium consistent with synovitis.
6. Extensive labral degeneration and tearing as before.
7. Proximal biceps tendinopathy.

## 2018-01-21 DIAGNOSIS — Z1589 Genetic susceptibility to other disease: Secondary | ICD-10-CM | POA: Diagnosis not present

## 2018-01-21 DIAGNOSIS — Z791 Long term (current) use of non-steroidal anti-inflammatories (NSAID): Secondary | ICD-10-CM | POA: Diagnosis not present

## 2018-01-21 DIAGNOSIS — Z681 Body mass index (BMI) 19 or less, adult: Secondary | ICD-10-CM | POA: Diagnosis not present

## 2018-01-21 DIAGNOSIS — M4690 Unspecified inflammatory spondylopathy, site unspecified: Secondary | ICD-10-CM | POA: Diagnosis not present

## 2018-01-21 DIAGNOSIS — M154 Erosive (osteo)arthritis: Secondary | ICD-10-CM | POA: Diagnosis not present

## 2018-01-23 DIAGNOSIS — M12812 Other specific arthropathies, not elsewhere classified, left shoulder: Secondary | ICD-10-CM | POA: Diagnosis not present

## 2018-01-23 DIAGNOSIS — M25512 Pain in left shoulder: Secondary | ICD-10-CM | POA: Diagnosis not present

## 2018-01-23 DIAGNOSIS — M75102 Unspecified rotator cuff tear or rupture of left shoulder, not specified as traumatic: Secondary | ICD-10-CM | POA: Diagnosis not present

## 2018-01-28 DIAGNOSIS — H903 Sensorineural hearing loss, bilateral: Secondary | ICD-10-CM | POA: Diagnosis not present

## 2018-02-04 DIAGNOSIS — G8929 Other chronic pain: Secondary | ICD-10-CM | POA: Diagnosis not present

## 2018-02-04 DIAGNOSIS — M25512 Pain in left shoulder: Secondary | ICD-10-CM | POA: Diagnosis not present

## 2018-02-04 DIAGNOSIS — M19012 Primary osteoarthritis, left shoulder: Secondary | ICD-10-CM | POA: Diagnosis not present

## 2018-02-20 ENCOUNTER — Telehealth: Payer: Self-pay | Admitting: Certified Nurse Midwife

## 2018-02-20 NOTE — Telephone Encounter (Signed)
Patient called and stated that the pharmacy said that there is "a problem with the insurance" when trying to refill medication.

## 2018-02-20 NOTE — Telephone Encounter (Signed)
Spoke with patient. Patient states that she is having trouble getting her estradiol patch filled. Unsure why thinks this is related to insurance coverage. Advised will review with pharmacy and return call.  Spoke with [facility] who states the patient picked up her rx April 1st and was given enough to get her to June as she is using 1/2 patch twice weekly. Patient is not due for a refill at this time.   Call to patient. Advised of information received from [facility]. Patient verbalizes understanding. States she forgot she packed two patches for vacation and will have enough until June.  Routing to provider for final review. Patient agreeable to disposition. Will close encounter.

## 2018-02-21 DIAGNOSIS — Z86010 Personal history of colonic polyps: Secondary | ICD-10-CM | POA: Diagnosis not present

## 2018-02-21 DIAGNOSIS — Z800 Family history of malignant neoplasm of digestive organs: Secondary | ICD-10-CM | POA: Diagnosis not present

## 2018-02-21 DIAGNOSIS — K5730 Diverticulosis of large intestine without perforation or abscess without bleeding: Secondary | ICD-10-CM | POA: Diagnosis not present

## 2018-02-21 DIAGNOSIS — K635 Polyp of colon: Secondary | ICD-10-CM | POA: Diagnosis not present

## 2018-02-21 DIAGNOSIS — K6289 Other specified diseases of anus and rectum: Secondary | ICD-10-CM | POA: Diagnosis not present

## 2018-02-26 DIAGNOSIS — K635 Polyp of colon: Secondary | ICD-10-CM | POA: Diagnosis not present

## 2018-03-16 HISTORY — PX: SHOULDER SURGERY: SHX246

## 2018-03-20 DIAGNOSIS — Z01818 Encounter for other preprocedural examination: Secondary | ICD-10-CM | POA: Diagnosis not present

## 2018-03-20 DIAGNOSIS — G8929 Other chronic pain: Secondary | ICD-10-CM | POA: Insufficient documentation

## 2018-03-20 DIAGNOSIS — M25512 Pain in left shoulder: Secondary | ICD-10-CM | POA: Diagnosis not present

## 2018-03-20 DIAGNOSIS — K219 Gastro-esophageal reflux disease without esophagitis: Secondary | ICD-10-CM | POA: Diagnosis not present

## 2018-03-20 DIAGNOSIS — H903 Sensorineural hearing loss, bilateral: Secondary | ICD-10-CM | POA: Diagnosis not present

## 2018-03-26 DIAGNOSIS — Z96612 Presence of left artificial shoulder joint: Secondary | ICD-10-CM | POA: Diagnosis not present

## 2018-03-26 DIAGNOSIS — M75102 Unspecified rotator cuff tear or rupture of left shoulder, not specified as traumatic: Secondary | ICD-10-CM | POA: Diagnosis not present

## 2018-03-26 DIAGNOSIS — M65812 Other synovitis and tenosynovitis, left shoulder: Secondary | ICD-10-CM | POA: Diagnosis not present

## 2018-03-26 DIAGNOSIS — G8929 Other chronic pain: Secondary | ICD-10-CM | POA: Diagnosis not present

## 2018-03-26 DIAGNOSIS — M12812 Other specific arthropathies, not elsewhere classified, left shoulder: Secondary | ICD-10-CM | POA: Diagnosis not present

## 2018-03-26 DIAGNOSIS — Z90710 Acquired absence of both cervix and uterus: Secondary | ICD-10-CM | POA: Diagnosis not present

## 2018-03-26 DIAGNOSIS — M25512 Pain in left shoulder: Secondary | ICD-10-CM | POA: Diagnosis not present

## 2018-03-26 DIAGNOSIS — M19012 Primary osteoarthritis, left shoulder: Secondary | ICD-10-CM | POA: Diagnosis not present

## 2018-03-26 DIAGNOSIS — G8918 Other acute postprocedural pain: Secondary | ICD-10-CM | POA: Diagnosis not present

## 2018-03-29 ENCOUNTER — Other Ambulatory Visit: Payer: Self-pay

## 2018-03-29 NOTE — Patient Outreach (Signed)
[facility]) Care Management  03/29/2018  [name] [birth year] [id number]   Referral received. No outreach warranted at this time. Transition of Care  will be completed by primary care provider office who will refer to [facility] care management if needed.  Plan: RN CM will close case.  Jone Baseman, RN, MSN [facility] Care Management Care Management Coordinator Direct Line [phone] Toll Free: [phone]  Fax: [phone]

## 2018-04-01 DIAGNOSIS — M25512 Pain in left shoulder: Secondary | ICD-10-CM | POA: Diagnosis not present

## 2018-04-04 DIAGNOSIS — M25512 Pain in left shoulder: Secondary | ICD-10-CM | POA: Diagnosis not present

## 2018-04-09 DIAGNOSIS — Z96612 Presence of left artificial shoulder joint: Secondary | ICD-10-CM | POA: Insufficient documentation

## 2018-04-10 DIAGNOSIS — Z96612 Presence of left artificial shoulder joint: Secondary | ICD-10-CM | POA: Diagnosis not present

## 2018-04-11 DIAGNOSIS — M25512 Pain in left shoulder: Secondary | ICD-10-CM | POA: Diagnosis not present

## 2018-04-15 DIAGNOSIS — M25512 Pain in left shoulder: Secondary | ICD-10-CM | POA: Diagnosis not present

## 2018-04-22 DIAGNOSIS — M25512 Pain in left shoulder: Secondary | ICD-10-CM | POA: Diagnosis not present

## 2018-04-25 DIAGNOSIS — M25512 Pain in left shoulder: Secondary | ICD-10-CM | POA: Diagnosis not present

## 2018-04-29 DIAGNOSIS — M25512 Pain in left shoulder: Secondary | ICD-10-CM | POA: Diagnosis not present

## 2018-05-02 ENCOUNTER — Other Ambulatory Visit: Payer: Self-pay

## 2018-05-02 DIAGNOSIS — R6882 Decreased libido: Secondary | ICD-10-CM

## 2018-05-02 DIAGNOSIS — M25512 Pain in left shoulder: Secondary | ICD-10-CM | POA: Diagnosis not present

## 2018-05-02 MED ORDER — NONFORMULARY OR COMPOUNDED ITEM: 0 refills | Status: DC

## 2018-05-02 NOTE — Telephone Encounter (Signed)
Medication refill request: Trestosterone propionate  Last AEX:  08/01/17 Next AEX: 08/08/18 Last MMG (if hormonal medication request): 08/27/17 Refill authorized: please refill if appropriate.

## 2018-05-03 NOTE — Telephone Encounter (Signed)
[facility]

## 2018-05-06 DIAGNOSIS — M154 Erosive (osteo)arthritis: Secondary | ICD-10-CM | POA: Diagnosis not present

## 2018-05-06 DIAGNOSIS — Z1589 Genetic susceptibility to other disease: Secondary | ICD-10-CM | POA: Diagnosis not present

## 2018-05-06 DIAGNOSIS — M4690 Unspecified inflammatory spondylopathy, site unspecified: Secondary | ICD-10-CM | POA: Diagnosis not present

## 2018-05-06 DIAGNOSIS — Z791 Long term (current) use of non-steroidal anti-inflammatories (NSAID): Secondary | ICD-10-CM | POA: Diagnosis not present

## 2018-05-06 DIAGNOSIS — Z681 Body mass index (BMI) 19 or less, adult: Secondary | ICD-10-CM | POA: Diagnosis not present

## 2018-05-06 DIAGNOSIS — M150 Primary generalized (osteo)arthritis: Secondary | ICD-10-CM | POA: Diagnosis not present

## 2018-05-08 DIAGNOSIS — M25512 Pain in left shoulder: Secondary | ICD-10-CM | POA: Diagnosis not present

## 2018-05-13 ENCOUNTER — Encounter (HOSPITAL_COMMUNITY): Payer: Self-pay | Admitting: Emergency Medicine

## 2018-05-13 ENCOUNTER — Ambulatory Visit (HOSPITAL_COMMUNITY)
Admission: EM | Admit: 2018-05-13 | Discharge: 2018-05-13 | Disposition: A | Discharge status: Home / Self Care | Payer: PPO | Attending: Family Medicine | Admitting: Family Medicine

## 2018-05-13 DIAGNOSIS — M25512 Pain in left shoulder: Secondary | ICD-10-CM | POA: Diagnosis not present

## 2018-05-13 DIAGNOSIS — L089 Local infection of the skin and subcutaneous tissue, unspecified: Secondary | ICD-10-CM

## 2018-05-13 DIAGNOSIS — T148XXA Other injury of unspecified body region, initial encounter: Secondary | ICD-10-CM | POA: Diagnosis not present

## 2018-05-13 MED ORDER — DOXYCYCLINE HYCLATE 100 MG PO CAPS: 100.0000 mg | ORAL_CAPSULE | Freq: Two times a day (BID) | ORAL | 0 refills | Status: DC

## 2018-05-13 NOTE — Discharge Instructions (Signed)
We will start antibiotics at this time for concern for possible infection.  Please call today to try to get in sooner with your orthopedist as we do not want this to worsen after such a significant surgery.  Warm compresses to the area may be helpful as well.

## 2018-05-13 NOTE — ED Triage Notes (Signed)
Pt here for wound check from surgical incision to left shoulder; pt sts small part looks infected

## 2018-05-13 NOTE — ED Provider Notes (Signed)
[place]    CSN: [id number] Arrival date & time: 05/13/18  1241     History   Chief Complaint Chief Complaint  Patient presents with  . Wound Check    HPI [name] is a 84 y.o. female.   Ezra presents with complaints of area to her left shoulder incision which has become more swollen and tender over the past three days. Had left shoulder replacement with Dr. Earlie Counts at [facility] 7 weeks ago. Still participating in P/T without difficulty. No affect to range of motion or shoulder joint pain. No fevers. No drainage. No known history of MRSA. Had staples which were removed. Has appointment for follow up 8/7. Hx of arthritis, gerd, headache, djd.    ROS per HPI.      Past Medical History:  Diagnosis Date  . Arthritis    "osteoarthritis"  . Diarrhea 08/28/11   "for the last 17 days; from [place] poisoning"  . GERD (gastroesophageal reflux disease)   . Headache(784.0) 08/28/11   "just for the last few days; releated to dehydration"  . Hearing aid worn   . Shortness of breath 08/28/11   "hard time breathing deeply because of the pain"    Patient Active Problem List   Diagnosis Date Noted  . Sensorineural hearing loss (SNHL), bilateral 01/03/2017  . Bilateral impacted cerumen 07/03/2016  . Rhinitis, chronic 07/03/2016  . H/O thrombocytosis 01/29/2012  . Neck pain, acute 08/31/2011  . Arthritis of hand 08/31/2011  . Supraventricular tachycardia ([place]) 08/31/2011  . Salmonella enteritis 08/28/2011  . Muscle spasm of back 08/28/2011  . Dehydration 08/28/2011  . GERD (gastroesophageal reflux disease) 08/28/2011  . DJD (degenerative joint disease) 08/28/2011  . Rotator cuff tear, left 08/28/2011  . ARF (acute renal failure) ([place]) 08/28/2011    Past Surgical History:  Procedure Laterality Date  . BACK SURGERY  1/06   "spinal stenosis"  . CATARACT EXTRACTION W/ INTRAOCULAR LENS  IMPLANT, BILATERAL  Summer 2011  . FACIAL COSMETIC SURGERY  ~ 2002  .  FINGER SURGERY  ~ 2000   "titanium rod in right pointer; from arthritis"  . TONSILLECTOMY  1943  . TUBAL LIGATION  1970's  . VAGINAL HYSTERECTOMY  2/04   "still have my ovaries"    OB History    Gravida  2   Para  2   Term  2   Preterm      AB      Living  1     SAB      TAB      Ectopic      Multiple      Live Births  2            Home Medications    Prior to Admission medications   Medication Sig Start Date End Date Taking? Authorizing Provider  B Complex Vitamins (B-COMPLEX/B-12 PO) Take by mouth daily.    [provider]  Biotin 10 MG TABS Take by mouth daily.    [provider]  Calcium Citrate (CITRACAL PO) Take 1 tablet by mouth daily.    [provider]  Coenzyme Q10 (CO Q 10 PO) Take by mouth daily.    [provider]  conjugated estrogens (PREMARIN) vaginal cream PLACE 1/2 gm  VAGINALLY 2 TIMES A WEEK AS DIRECTED 08/01/17   Regina Eck, CNM  diclofenac sodium (VOLTAREN) 1 % GEL  10/24/16   [provider]  doxycycline (VIBRAMYCIN) 100 MG capsule Take 1 capsule (  100 mg total) by mouth 2 (two) times daily. 05/13/18   Zigmund Gottron, NP  estradiol (VIVELLE-DOT) 0.025 MG/24HR Place 1 patch onto the skin 2 (two) times a week. 10/04/17   Regina Eck, CNM  ipratropium (ATROVENT) 0.06 % nasal spray U 2 SPRAYS IEN TID PRF RHINITIS 12/03/16   [provider]  meloxicam (MOBIC) 15 MG tablet Take 7.5 mg by mouth daily.  04/05/13   [provider]  Multiple Vitamin (MULTIVITAMIN) tablet Take 1 tablet by mouth daily.    [provider]  NONFORMULARY OR COMPOUNDED ITEM TESTOSTERONE PROPIONATE PETROLATUM (JAR)  APPLY ONE TEAR DROP SIZE AMOUNT TO VAGINAL AREA AT BEDTIME TWO TIMES WEEKLY. 05/02/18   Regina Eck, CNM  OVER THE COUNTER MEDICATION Place 1 drop into both eyes 2 (two) times daily as needed (redness/ dry eyes). Over the counter eye drops    [provider]  OVER  THE COUNTER MEDICATION Take 2 tablets by mouth at bedtime as needed (sleep). Aleve PM    [provider]  ranitidine (ZANTAC) 75 MG tablet Take 75 mg by mouth 2 (two) times daily.    [provider]  valACYclovir (VALTREX) 1000 MG tablet  01/06/17   [provider]  zolpidem (AMBIEN) 5 MG tablet Take 1 tablet (5 mg total) by mouth at bedtime as needed for sleep (insomnia). 09/03/11   Hosie Poisson, MD    Family History Family History  Problem Relation Age of Onset  . Cancer Father        colon    Social History Social History   Tobacco Use  . Smoking status: Former Smoker    Packs/day: 1.50    Years: 30.00    Pack years: 45.00    Types: Cigarettes  . Smokeless tobacco: Never Used  . Tobacco comment: quit 30 years go   Substance Use Topics  . Alcohol use: Yes    Comment: 1-2 per day  . Drug use: No    Comment: "quit smoking cigarettes ~ 1980's"     Allergies   Patient has no known allergies.   Review of Systems Review of Systems   Physical Exam Triage Vital Signs ED Triage Vitals [05/13/18 1319]  Enc Vitals Group     BP (!) 163/65     Pulse Rate (!) 58     Resp 18     Temp 98.1 F (36.7 C)     Temp Source Oral     SpO2 99 %     Weight      Height      Head Circumference      Peak Flow      Pain Score      Pain Loc      Pain Edu?      Excl. in [place]?    No data found.  Updated Vital Signs BP (!) 163/65 (BP Location: Left Arm)   Pulse (!) 58   Temp 98.1 F (36.7 C) (Oral)   Resp 18   SpO2 99%   Visual Acuity Right Eye Distance:   Left Eye Distance:   Bilateral Distance:    Right Eye Near:   Left Eye Near:    Bilateral Near:     Physical Exam  Constitutional: She is oriented to person, place, and time. She appears well-developed and well-nourished. No distress.  Cardiovascular: Normal rate, regular rhythm and normal heart sounds.  Pulmonary/Chest: Effort normal and breath sounds normal.  Neurological: She is alert  and oriented to person, place, and time.  Skin: Skin is warm and dry.          UC Treatments / Results  Labs (all labs ordered are listed, but only abnormal results are displayed) Labs Reviewed - No data to display  EKG None  Radiology No results found.  Procedures Procedures (including critical care time)  Medications Ordered in UC Medications - No data to display  Initial Impression / Assessment and Plan / UC Course  I have reviewed the triage vital signs and the nursing notes.  Pertinent labs & imaging results that were available during my care of the patient were reviewed by me and considered in my medical decision making (see chart for details).     Antibiotics initiated at this time. Uncertain if this is related to underlying sutures, vs keloid vs small abscess presence. No dehiscence. No shoulder pain. No drainage. Encouraged follow up with surgeon for further evaluation and treatment as may need further intervention. Patient verbalized understanding and agreeable to plan.    Final Clinical Impressions(s) / UC Diagnoses   Final diagnoses:  Wound infection     Discharge Instructions     We will start antibiotics at this time for concern for possible infection.  Please call today to try to get in sooner with your orthopedist as we do not want this to worsen after such a significant surgery.  Warm compresses to the area may be helpful as well.     ED Prescriptions    Medication Sig Dispense Auth. Provider   doxycycline (VIBRAMYCIN) 100 MG capsule Take 1 capsule (100 mg total) by mouth 2 (two) times daily. 20 capsule Zigmund Gottron, NP     Controlled Substance Prescriptions [place] Controlled Substance Registry consulted? Not Applicable   Zigmund Gottron, NP 05/13/18 1348

## 2018-05-16 DIAGNOSIS — M25512 Pain in left shoulder: Secondary | ICD-10-CM | POA: Diagnosis not present

## 2018-05-22 DIAGNOSIS — M25512 Pain in left shoulder: Secondary | ICD-10-CM | POA: Diagnosis not present

## 2018-05-24 DIAGNOSIS — R030 Elevated blood-pressure reading, without diagnosis of hypertension: Secondary | ICD-10-CM | POA: Diagnosis not present

## 2018-05-24 DIAGNOSIS — M25512 Pain in left shoulder: Secondary | ICD-10-CM | POA: Diagnosis not present

## 2018-05-30 DIAGNOSIS — M25512 Pain in left shoulder: Secondary | ICD-10-CM | POA: Diagnosis not present

## 2018-06-03 DIAGNOSIS — M25512 Pain in left shoulder: Secondary | ICD-10-CM | POA: Diagnosis not present

## 2018-06-06 DIAGNOSIS — H6122 Impacted cerumen, left ear: Secondary | ICD-10-CM | POA: Diagnosis not present

## 2018-06-06 DIAGNOSIS — H6121 Impacted cerumen, right ear: Secondary | ICD-10-CM | POA: Diagnosis not present

## 2018-06-06 DIAGNOSIS — H6123 Impacted cerumen, bilateral: Secondary | ICD-10-CM | POA: Diagnosis not present

## 2018-06-07 DIAGNOSIS — M25512 Pain in left shoulder: Secondary | ICD-10-CM | POA: Diagnosis not present

## 2018-06-18 DIAGNOSIS — M25512 Pain in left shoulder: Secondary | ICD-10-CM | POA: Diagnosis not present

## 2018-06-20 DIAGNOSIS — M25512 Pain in left shoulder: Secondary | ICD-10-CM | POA: Diagnosis not present

## 2018-06-24 ENCOUNTER — Other Ambulatory Visit: Payer: Self-pay | Admitting: Certified Nurse Midwife

## 2018-06-24 DIAGNOSIS — M25512 Pain in left shoulder: Secondary | ICD-10-CM | POA: Diagnosis not present

## 2018-06-24 DIAGNOSIS — M8588 Other specified disorders of bone density and structure, other site: Secondary | ICD-10-CM

## 2018-06-26 DIAGNOSIS — M25512 Pain in left shoulder: Secondary | ICD-10-CM | POA: Diagnosis not present

## 2018-07-01 DIAGNOSIS — M25512 Pain in left shoulder: Secondary | ICD-10-CM | POA: Diagnosis not present

## 2018-07-05 DIAGNOSIS — M25512 Pain in left shoulder: Secondary | ICD-10-CM | POA: Diagnosis not present

## 2018-07-08 DIAGNOSIS — M25512 Pain in left shoulder: Secondary | ICD-10-CM | POA: Diagnosis not present

## 2018-07-10 ENCOUNTER — Other Ambulatory Visit: Payer: Self-pay | Admitting: Certified Nurse Midwife

## 2018-07-10 MED ORDER — ESTRADIOL 0.025 MG/24HR TD PTTW: 1.0000 | MEDICATED_PATCH | TRANSDERMAL | 0 refills | Status: DC

## 2018-07-10 NOTE — Telephone Encounter (Signed)
Medication refill request: Vivelle dot patch  Last AEX:  08/01/17 DL Next AEX: 08/08/18 DL Last MMG (if hormonal medication request): 08/27/17 BIRADS2:benign  Refill authorized: 10/04/17 #[id number]. Today please advise.

## 2018-07-10 NOTE — Telephone Encounter (Signed)
Patient is requesting a refill of estradiol to the pharmacy on file.

## 2018-08-01 DIAGNOSIS — M154 Erosive (osteo)arthritis: Secondary | ICD-10-CM | POA: Diagnosis not present

## 2018-08-01 DIAGNOSIS — Z1589 Genetic susceptibility to other disease: Secondary | ICD-10-CM | POA: Diagnosis not present

## 2018-08-01 DIAGNOSIS — Z681 Body mass index (BMI) 19 or less, adult: Secondary | ICD-10-CM | POA: Diagnosis not present

## 2018-08-01 DIAGNOSIS — L570 Actinic keratosis: Secondary | ICD-10-CM | POA: Diagnosis not present

## 2018-08-01 DIAGNOSIS — M4690 Unspecified inflammatory spondylopathy, site unspecified: Secondary | ICD-10-CM | POA: Diagnosis not present

## 2018-08-01 DIAGNOSIS — M150 Primary generalized (osteo)arthritis: Secondary | ICD-10-CM | POA: Diagnosis not present

## 2018-08-01 DIAGNOSIS — L820 Inflamed seborrheic keratosis: Secondary | ICD-10-CM | POA: Diagnosis not present

## 2018-08-01 DIAGNOSIS — Z85828 Personal history of other malignant neoplasm of skin: Secondary | ICD-10-CM | POA: Diagnosis not present

## 2018-08-01 DIAGNOSIS — Z791 Long term (current) use of non-steroidal anti-inflammatories (NSAID): Secondary | ICD-10-CM | POA: Diagnosis not present

## 2018-08-08 ENCOUNTER — Ambulatory Visit (INDEPENDENT_AMBULATORY_CARE_PROVIDER_SITE_OTHER): Payer: PPO | Admitting: Certified Nurse Midwife

## 2018-08-08 ENCOUNTER — Other Ambulatory Visit: Payer: Self-pay

## 2018-08-08 ENCOUNTER — Encounter: Payer: Self-pay | Admitting: Certified Nurse Midwife

## 2018-08-08 VITALS — BP 120/70 | HR 60 | Resp 16 | Ht 62.0 in | Wt 106.0 lb

## 2018-08-08 DIAGNOSIS — R6882 Decreased libido: Secondary | ICD-10-CM

## 2018-08-08 DIAGNOSIS — Z01419 Encounter for gynecological examination (general) (routine) without abnormal findings: Secondary | ICD-10-CM | POA: Diagnosis not present

## 2018-08-08 DIAGNOSIS — Z8739 Personal history of other diseases of the musculoskeletal system and connective tissue: Secondary | ICD-10-CM

## 2018-08-08 DIAGNOSIS — N952 Postmenopausal atrophic vaginitis: Secondary | ICD-10-CM

## 2018-08-08 DIAGNOSIS — Z79890 Hormone replacement therapy (postmenopausal): Secondary | ICD-10-CM | POA: Diagnosis not present

## 2018-08-08 MED ORDER — NONFORMULARY OR COMPOUNDED ITEM: 2 refills | Status: DC

## 2018-08-08 MED ORDER — ESTRADIOL 0.025 MG/24HR TD PTTW: MEDICATED_PATCH | TRANSDERMAL | 4 refills | Status: DC

## 2018-08-08 MED ORDER — ESTROGENS, CONJUGATED 0.625 MG/GM VA CREA: TOPICAL_CREAM | VAGINAL | 3 refills | Status: DC

## 2018-08-08 NOTE — Progress Notes (Signed)
84 y.o. G59P2001 Married  Caucasian Fe here for annual exam. Post menopausal on HRT for health benefits and bone health. Denies hot flashes or night sweats with use. Premarin cream working well for vaginal dryness uses twice weekly. Continues Testosterone Cream use for decrease libido and feels does work for her. Has noted no increase hair growth. Continues with facial fillers in [place] for skin tightness with good results. Sees PCP yearly for labs and medication management of GERD and arthritis with Meloxicam use. No other health concerns today. Ambulating well and exercises daily. Spent time in [place] again this year!  No LMP recorded. Patient has had a hysterectomy.          Sexually active: Yes.    The current method of family planning is status post hysterectomy.    Exercising: Yes.    walking, weight training & yoga Smoker:  no  Review of Systems  Constitutional: Negative.   HENT: Negative.   Eyes: Negative.   Respiratory: Negative.   Cardiovascular: Negative.   Gastrointestinal: Negative.   Genitourinary: Negative.   Musculoskeletal: Negative.   Skin: Negative.   Neurological: Negative.   Endo/Heme/Allergies: Negative.   Psychiatric/Behavioral: Negative.     Health Maintenance: Pap:  7/09 neg History of Abnormal Pap: no MMG:  08-27-17 category b density,   birads 2:neg, has scheduled Self Breast exams: occ Colonoscopy:  2019 normal BMD:   2017 osteopenia TDaP:  2010 Shingles: 2019 Pneumonia: yes Hep C and HIV: not done Labs: if needed   reports that she has quit smoking. Her smoking use included cigarettes. She has a 45.00 pack-year smoking history. She has never used smokeless tobacco. She reports that she drinks alcohol. She reports that she does not use drugs.  Past Medical History:  Diagnosis Date  . Arthritis    "osteoarthritis"  . Diarrhea 08/28/11   "for the last 17 days; from [place] poisoning"  . GERD (gastroesophageal reflux disease)   . Headache(784.0)  08/28/11   "just for the last few days; releated to dehydration"  . Hearing aid worn   . Shortness of breath 08/28/11   "hard time breathing deeply because of the pain"    Past Surgical History:  Procedure Laterality Date  . BACK SURGERY  1/06   "spinal stenosis"  . CATARACT EXTRACTION W/ INTRAOCULAR LENS  IMPLANT, BILATERAL  Summer 2011  . FACIAL COSMETIC SURGERY  ~ 2002  . FINGER SURGERY  ~ 2000   "titanium rod in right pointer; from arthritis"  . TONSILLECTOMY  1943  . TUBAL LIGATION  1970's  . VAGINAL HYSTERECTOMY  2/04   "still have my ovaries"    Current Outpatient Medications  Medication Sig Dispense Refill  . B Complex Vitamins (B-COMPLEX/B-12 PO) Take by mouth daily.    . Biotin 10 MG TABS Take by mouth daily.    . Calcium Citrate (CITRACAL PO) Take 1 tablet by mouth daily.    . Coenzyme Q10 (CO Q 10 PO) Take by mouth daily.    Marland Kitchen conjugated estrogens (PREMARIN) vaginal cream PLACE 1/2 gm  VAGINALLY 2 TIMES A WEEK AS DIRECTED 30 g 2  . diclofenac sodium (VOLTAREN) 1 % GEL     . doxycycline (VIBRAMYCIN) 100 MG capsule Take 1 capsule (100 mg total) by mouth 2 (two) times daily. 20 capsule 0  . estradiol (VIVELLE-DOT) 0.025 MG/24HR Place 1 patch onto the skin 2 (two) times a week. 24 patch 0  . ipratropium (ATROVENT) 0.06 % nasal spray U 2  SPRAYS IEN TID PRF RHINITIS  5  . meloxicam (MOBIC) 15 MG tablet Take 7.5 mg by mouth daily.     . Multiple Vitamin (MULTIVITAMIN) tablet Take 1 tablet by mouth daily.    . NONFORMULARY OR COMPOUNDED ITEM TESTOSTERONE PROPIONATE PETROLATUM (JAR)  APPLY ONE TEAR DROP SIZE AMOUNT TO VAGINAL AREA AT BEDTIME TWO TIMES WEEKLY. 60 each 0  . OVER THE COUNTER MEDICATION Place 1 drop into both eyes 2 (two) times daily as needed (redness/ dry eyes). Over the counter eye drops    . OVER THE COUNTER MEDICATION Take 2 tablets by mouth at bedtime as needed (sleep). Aleve PM    . ranitidine (ZANTAC) 75 MG tablet Take 75 mg by mouth 2 (two) times daily.     . valACYclovir (VALTREX) 1000 MG tablet     . zolpidem (AMBIEN) 5 MG tablet Take 1 tablet (5 mg total) by mouth at bedtime as needed for sleep (insomnia). 15 tablet 0   No current facility-administered medications for this visit.     Family History  Problem Relation Age of Onset  . Cancer Father        colon    ROS:  Pertinent items are noted in HPI.  Otherwise, a comprehensive ROS was negative.  Exam:   There were no vitals taken for this visit.   Ht Readings from Last 3 Encounters:  08/01/17 5\' 2"  (1.575 m)  01/27/17 5\' 2"  (1.575 m)  01/09/17 5' 2.25" (1.581 m)    General appearance: alert, cooperative and appears stated age Head: Normocephalic, without obvious abnormality, atraumatic Neck: no adenopathy, supple, symmetrical, trachea midline and thyroid normal to inspection and palpation Lungs: clear to auscultation bilaterally Breasts: normal appearance, no masses or tenderness, No nipple retraction or dimpling, No nipple discharge or bleeding, No axillary or supraclavicular adenopathy Heart: regular rate and rhythm Abdomen: soft, non-tender; no masses,  no organomegaly Extremities: extremities normal, atraumatic, no cyanosis or edema Skin: Skin color, texture, turgor normal. No rashes or lesions Lymph nodes: Cervical, supraclavicular, and axillary nodes normal. No abnormal inguinal nodes palpated Neurologic: Grossly normal   Pelvic: External genitalia:  no lesions              Urethra:  normal appearing urethra with no masses, tenderness or lesions              Bartholin's and Skene's: normal                 Vagina: normal appearing vagina with normal color and discharge, no lesions              Cervix: absent              Pap taken: No. Bimanual Exam:  Uterus:  uterus absent              Adnexa: normal adnexa and no mass, fullness, tenderness               Rectovaginal: Confirms               Anus:  normal sphincter tone, no lesions  Chaperone present: yes  A:   Well Woman with normal exam  Post menopausal on ERT for quality of life and bone support with Osteopenia, no warning signs with use. Has decreased to 1/2 patch working well  Atrophic vaginitis with Premarin cream use, no issues.   Testosterone working well for decrease libido, no issues. Will do level today.  History of HSV with  occasional outbreak.  GERD and arthritis with MD management  P:   Reviewed health and wellness pertinent to exam  Discussed risks/benefits of ERT use and warning signs. Patient aware and requests continuance.  Rx Vivelle Dot 0.025 mg see order with instructions for 1/2 patch twice weekly  Risks/benefits/warning signs with Premarin cream given desires Rx  Rx Premarin cream vaginally twice weekly  Risks /benefits/warning signs reviewed, patient desires continuance.  Rx testosterone Cream see order with instructions  Lab: testosterone female  No refill for Valtrex given , declines.  Continue follow up with MD as indicated  Pap smear: no   counseled on breast self exam, mammography screening, feminine hygiene, adequate intake of calcium and vitamin D, diet and exercise, Kegel's exercises  return annually or prn  An After Visit Summary was printed and given to the patient.

## 2018-08-08 NOTE — Patient Instructions (Signed)
EXERCISE AND DIET:  We recommended that you start or continue a regular exercise program for good health. Regular exercise means any activity that makes your heart beat faster and makes you sweat.  We recommend exercising at least 30 minutes per day at least 3 days a week, preferably 4 or 5.  We also recommend a diet low in fat and sugar.  Inactivity, poor dietary choices and obesity can cause diabetes, heart attack, stroke, and kidney damage, among others.    ALCOHOL AND SMOKING:  Women should limit their alcohol intake to no more than 7 drinks/beers/glasses of wine (combined, not each!) per week. Moderation of alcohol intake to this level decreases your risk of breast cancer and liver damage. And of course, no recreational drugs are part of a healthy lifestyle.  And absolutely no smoking or even second hand smoke. Most people know smoking can cause heart and lung diseases, but did you know it also contributes to weakening of your bones? Aging of your skin?  Yellowing of your teeth and nails?  CALCIUM AND VITAMIN D:  Adequate intake of calcium and Vitamin D are recommended.  The recommendations for exact amounts of these supplements seem to change often, but generally speaking 600 mg of calcium (either carbonate or citrate) and 800 units of Vitamin D per day seems prudent. Certain women may benefit from higher intake of Vitamin D.  If you are among these women, your doctor will have told you during your visit.    PAP SMEARS:  Pap smears, to check for cervical cancer or precancers,  have traditionally been done yearly, although recent scientific advances have shown that most women can have pap smears less often.  However, every woman still should have a physical exam from her gynecologist every year. It will include a breast check, inspection of the vulva and vagina to check for abnormal growths or skin changes, a visual exam of the cervix, and then an exam to evaluate the size and shape of the uterus and  ovaries.  And after 82 years of age, a rectal exam is indicated to check for rectal cancers. We will also provide age appropriate advice regarding health maintenance, like when you should have certain vaccines, screening for sexually transmitted diseases, bone density testing, colonoscopy, mammograms, etc.   MAMMOGRAMS:  All women over 40 years old should have a yearly mammogram. Many facilities now offer a "3D" mammogram, which may cost around $50 extra out of pocket. If possible,  we recommend you accept the option to have the 3D mammogram performed.  It both reduces the number of women who will be called back for extra views which then turn out to be normal, and it is better than the routine mammogram at detecting truly abnormal areas.    COLONOSCOPY:  Colonoscopy to screen for colon cancer is recommended for all women at age 50.  We know, you hate the idea of the prep.  We agree, BUT, having colon cancer and not knowing it is worse!!  Colon cancer so often starts as a polyp that can be seen and removed at colonscopy, which can quite literally save your life!  And if your first colonoscopy is normal and you have no family history of colon cancer, most women don't have to have it again for 10 years.  Once every ten years, you can do something that may end up saving your life, right?  We will be happy to help you get it scheduled when you are ready.    Be sure to check your insurance coverage so you understand how much it will cost.  It may be covered as a preventative service at no cost, but you should check your particular policy.      Atrophic Vaginitis Atrophic vaginitis is when the tissues that line the vagina become dry and thin. This is caused by a drop in estrogen. Estrogen helps:  To keep the vagina moist.  To make a clear fluid that helps: ? To lubricate the vagina for sex. ? To protect the vagina from infection.  If the lining of the vagina is dry and thin, it may:  Make sex painful. It  may also cause bleeding.  Cause a feeling of: ? Burning. ? Irritation. ? Itchiness.  Make an exam of your vagina painful. It may also cause bleeding.  Make you lose interest in sex.  Cause a burning feeling when you pee.  Make your vaginal fluid (discharge) brown or yellow.  For some women, there are no symptoms. This condition is most common in women who do not get their regular menstrual periods anymore (menopause). This often starts when a woman is 45-55 years old. Follow these instructions at home:  Take medicines only as told by your doctor. Do not use any herbal or alternative medicines unless your doctor says it is okay.  Use over-the-counter products for dryness only as told by your doctor. These include: ? Creams. ? Lubricants. ? Moisturizers.  Do not douche.  Do not use products that can make your vagina dry. These include: ? Scented feminine sprays. ? Scented tampons. ? Scented soaps.  If it hurts to have sex, tell your sexual partner. Contact a doctor if:  Your discharge looks different than normal.  Your vagina has an unusual smell.  You have new symptoms.  Your symptoms do not get better with treatment.  Your symptoms get worse. This information is not intended to replace advice given to you by your health care provider. Make sure you discuss any questions you have with your health care provider. Document Released: 03/20/2008 Document Revised: 03/09/2016 Document Reviewed: 09/23/2014 Elsevier Interactive Patient Education  2018 Elsevier Inc.  

## 2018-08-11 LAB — TESTOSTERONE, TOTAL, LC/MS/MS: Testosterone, total: 12.7 ng/dL (ref 7.0–40.0)

## 2018-08-21 DIAGNOSIS — Z96612 Presence of left artificial shoulder joint: Secondary | ICD-10-CM | POA: Diagnosis not present

## 2018-09-05 DIAGNOSIS — Z1231 Encounter for screening mammogram for malignant neoplasm of breast: Secondary | ICD-10-CM | POA: Diagnosis not present

## 2018-09-17 DIAGNOSIS — H16213 Exposure keratoconjunctivitis, bilateral: Secondary | ICD-10-CM | POA: Diagnosis not present

## 2018-09-17 DIAGNOSIS — H5000 Unspecified esotropia: Secondary | ICD-10-CM | POA: Diagnosis not present

## 2018-09-26 DIAGNOSIS — M4690 Unspecified inflammatory spondylopathy, site unspecified: Secondary | ICD-10-CM | POA: Diagnosis not present

## 2018-09-26 DIAGNOSIS — M154 Erosive (osteo)arthritis: Secondary | ICD-10-CM | POA: Diagnosis not present

## 2018-09-26 DIAGNOSIS — Z1589 Genetic susceptibility to other disease: Secondary | ICD-10-CM | POA: Diagnosis not present

## 2018-09-26 DIAGNOSIS — Z79899 Other long term (current) drug therapy: Secondary | ICD-10-CM | POA: Diagnosis not present

## 2018-09-26 DIAGNOSIS — M150 Primary generalized (osteo)arthritis: Secondary | ICD-10-CM | POA: Diagnosis not present

## 2018-09-26 DIAGNOSIS — Z681 Body mass index (BMI) 19 or less, adult: Secondary | ICD-10-CM | POA: Diagnosis not present

## 2018-09-30 DIAGNOSIS — H04123 Dry eye syndrome of bilateral lacrimal glands: Secondary | ICD-10-CM | POA: Diagnosis not present

## 2018-09-30 DIAGNOSIS — H16213 Exposure keratoconjunctivitis, bilateral: Secondary | ICD-10-CM | POA: Diagnosis not present

## 2018-09-30 DIAGNOSIS — H04121 Dry eye syndrome of right lacrimal gland: Secondary | ICD-10-CM | POA: Diagnosis not present

## 2018-11-05 DIAGNOSIS — H04123 Dry eye syndrome of bilateral lacrimal glands: Secondary | ICD-10-CM | POA: Diagnosis not present

## 2018-11-05 DIAGNOSIS — H5000 Unspecified esotropia: Secondary | ICD-10-CM | POA: Diagnosis not present

## 2018-12-02 DIAGNOSIS — H9113 Presbycusis, bilateral: Secondary | ICD-10-CM | POA: Insufficient documentation

## 2018-12-02 DIAGNOSIS — H6121 Impacted cerumen, right ear: Secondary | ICD-10-CM | POA: Diagnosis not present

## 2018-12-02 DIAGNOSIS — H6123 Impacted cerumen, bilateral: Secondary | ICD-10-CM | POA: Diagnosis not present

## 2018-12-02 DIAGNOSIS — H6122 Impacted cerumen, left ear: Secondary | ICD-10-CM | POA: Diagnosis not present

## 2018-12-12 ENCOUNTER — Ambulatory Visit: Payer: PPO | Admitting: Podiatry

## 2018-12-12 ENCOUNTER — Encounter: Payer: Self-pay | Admitting: Podiatry

## 2018-12-12 ENCOUNTER — Ambulatory Visit (INDEPENDENT_AMBULATORY_CARE_PROVIDER_SITE_OTHER): Payer: PPO

## 2018-12-12 VITALS — BP 135/63 | HR 66 | Resp 16

## 2018-12-12 DIAGNOSIS — M779 Enthesopathy, unspecified: Secondary | ICD-10-CM

## 2018-12-12 DIAGNOSIS — M2041 Other hammer toe(s) (acquired), right foot: Secondary | ICD-10-CM

## 2018-12-12 DIAGNOSIS — L84 Corns and callosities: Secondary | ICD-10-CM | POA: Diagnosis not present

## 2018-12-12 DIAGNOSIS — M79674 Pain in right toe(s): Secondary | ICD-10-CM | POA: Diagnosis not present

## 2018-12-12 DIAGNOSIS — M2042 Other hammer toe(s) (acquired), left foot: Principal | ICD-10-CM

## 2018-12-12 MED ORDER — TRIAMCINOLONE ACETONIDE 10 MG/ML IJ SUSP
10.0000 mg | Freq: Once | INTRAMUSCULAR | Status: AC
  Administered 2018-12-12: 10 mg

## 2018-12-12 NOTE — Progress Notes (Signed)
Subjective:   Patient ID: [name], female   DOB: 85 y.o.   MRN: [id number]   HPI Patient presents stating the second toe is been bothering her for a long time and it is gotten worse recently.  States that it is inflamed and hard for her to wear shoe gear with and that she is tried to do some changes without relief of symptoms.  Patient does not smoke and likes to be active   Review of Systems  All other systems reviewed and are negative.       Objective:  Physical Exam Vitals signs and nursing note reviewed.  Constitutional:      Appearance: She is well-developed.  Pulmonary:     Effort: Pulmonary effort is normal.  Musculoskeletal: Normal range of motion.  Skin:    General: Skin is warm.  Neurological:     Mental Status: She is alert.     Neurovascular status intact muscle strength adequate range of motion within normal limits with pressure between the hallux and second toe right with inflammation of the distal interphalangeal joint medial side with keratotic lesion and pressure occurring between the 2 toes.  Patient is noted to have good digital perfusion and is well oriented x3     Assessment:  Digital deformity with hammertoe deformity and pressure between the hallux and second toe right foot with fluid buildup of the inner phalangeal joint     Plan:  Acute capsulitis of the second digit right foot medial side with inner phalangeal joint irritation and pressure between the 2 toes.  Reviewed condition and did initial H&P x-ray and today I did a proximal nerve block of the right second toe and I went ahead and using sterile technique I injected the inner phalangeal joint with 2 mg dexamethasone Kenalog combination and then did sterile debridement with sharp instrumentation second digit and applied padding.  Discussed possible distal arthroplasty if symptoms persist  X-ray indicates there is pressure between the hallux and second toe right foot with structural changes of  the inner phalangeal joint

## 2018-12-12 NOTE — Progress Notes (Signed)
   Subjective:    Patient ID: [name], female    DOB: 05-Jul-1933, 85 y.o.   MRN: [id number]  HPI    Review of Systems  All other systems reviewed and are negative.      Objective:   Physical Exam        Assessment & Plan:

## 2018-12-13 ENCOUNTER — Other Ambulatory Visit: Payer: Self-pay | Admitting: Certified Nurse Midwife

## 2018-12-16 ENCOUNTER — Other Ambulatory Visit: Payer: Self-pay | Admitting: Certified Nurse Midwife

## 2018-12-16 NOTE — Telephone Encounter (Signed)
Medication refill request: Vivelle Dot  Last AEX:  08/08/18 Next AEX: 08/20/19 Last MMG (if hormonal medication request): 09/05/18 Bi-rads 1 Neg Refill authorized: #12 with 3 RF pended.

## 2018-12-16 NOTE — Telephone Encounter (Signed)
Patient requesting a refill on estradiol. [facility] on [address] at [phone]

## 2018-12-17 MED ORDER — ESTRADIOL 0.025 MG/24HR TD PTTW: MEDICATED_PATCH | TRANSDERMAL | 2 refills | Status: DC

## 2018-12-27 DIAGNOSIS — M79671 Pain in right foot: Secondary | ICD-10-CM | POA: Diagnosis not present

## 2018-12-30 DIAGNOSIS — M4690 Unspecified inflammatory spondylopathy, site unspecified: Secondary | ICD-10-CM | POA: Diagnosis not present

## 2018-12-30 DIAGNOSIS — M154 Erosive (osteo)arthritis: Secondary | ICD-10-CM | POA: Diagnosis not present

## 2018-12-30 DIAGNOSIS — Z681 Body mass index (BMI) 19 or less, adult: Secondary | ICD-10-CM | POA: Diagnosis not present

## 2018-12-30 DIAGNOSIS — M150 Primary generalized (osteo)arthritis: Secondary | ICD-10-CM | POA: Diagnosis not present

## 2018-12-30 DIAGNOSIS — Z1589 Genetic susceptibility to other disease: Secondary | ICD-10-CM | POA: Diagnosis not present

## 2018-12-30 DIAGNOSIS — Z791 Long term (current) use of non-steroidal anti-inflammatories (NSAID): Secondary | ICD-10-CM | POA: Diagnosis not present

## 2019-01-01 DIAGNOSIS — H04123 Dry eye syndrome of bilateral lacrimal glands: Secondary | ICD-10-CM | POA: Diagnosis not present

## 2019-01-01 DIAGNOSIS — Z961 Presence of intraocular lens: Secondary | ICD-10-CM | POA: Diagnosis not present

## 2019-01-06 DIAGNOSIS — K219 Gastro-esophageal reflux disease without esophagitis: Secondary | ICD-10-CM | POA: Diagnosis not present

## 2019-01-06 DIAGNOSIS — M79671 Pain in right foot: Secondary | ICD-10-CM | POA: Diagnosis not present

## 2019-01-06 DIAGNOSIS — E871 Hypo-osmolality and hyponatremia: Secondary | ICD-10-CM | POA: Diagnosis not present

## 2019-01-06 DIAGNOSIS — F419 Anxiety disorder, unspecified: Secondary | ICD-10-CM | POA: Diagnosis not present

## 2019-01-06 DIAGNOSIS — G479 Sleep disorder, unspecified: Secondary | ICD-10-CM | POA: Diagnosis not present

## 2019-01-06 DIAGNOSIS — Z1389 Encounter for screening for other disorder: Secondary | ICD-10-CM | POA: Diagnosis not present

## 2019-01-06 DIAGNOSIS — Z0000 Encounter for general adult medical examination without abnormal findings: Secondary | ICD-10-CM | POA: Diagnosis not present

## 2019-01-06 DIAGNOSIS — E7800 Pure hypercholesterolemia, unspecified: Secondary | ICD-10-CM | POA: Diagnosis not present

## 2019-01-06 DIAGNOSIS — F39 Unspecified mood [affective] disorder: Secondary | ICD-10-CM | POA: Diagnosis not present

## 2019-01-06 DIAGNOSIS — M899 Disorder of bone, unspecified: Secondary | ICD-10-CM | POA: Diagnosis not present

## 2019-01-06 DIAGNOSIS — D692 Other nonthrombocytopenic purpura: Secondary | ICD-10-CM | POA: Diagnosis not present

## 2019-01-09 ENCOUNTER — Ambulatory Visit: Payer: PPO | Admitting: Podiatry

## 2019-01-20 DIAGNOSIS — M25511 Pain in right shoulder: Secondary | ICD-10-CM | POA: Diagnosis not present

## 2019-01-29 DIAGNOSIS — M25511 Pain in right shoulder: Secondary | ICD-10-CM | POA: Diagnosis not present

## 2019-02-17 ENCOUNTER — Ambulatory Visit: Payer: PPO | Admitting: Podiatry

## 2019-02-17 ENCOUNTER — Encounter: Payer: Self-pay | Admitting: Podiatry

## 2019-02-17 ENCOUNTER — Other Ambulatory Visit: Payer: Self-pay

## 2019-02-17 VITALS — Temp 99.3°F

## 2019-02-17 DIAGNOSIS — M778 Other enthesopathies, not elsewhere classified: Secondary | ICD-10-CM

## 2019-02-17 DIAGNOSIS — M2041 Other hammer toe(s) (acquired), right foot: Secondary | ICD-10-CM | POA: Diagnosis not present

## 2019-02-17 DIAGNOSIS — D169 Benign neoplasm of bone and articular cartilage, unspecified: Secondary | ICD-10-CM

## 2019-02-17 DIAGNOSIS — M779 Enthesopathy, unspecified: Secondary | ICD-10-CM

## 2019-02-17 NOTE — Progress Notes (Signed)
Subjective:   Patient ID: [name], female   DOB: 85 y.o.   MRN: [id number]   HPI Patient presents stating that spot between the big toe and second toe is still bothering me quite a bit and it got better for a short period of time   ROS      Objective:  Physical Exam  Neurovascular status intact with irritation between the hallux and second digit right with inflammation at the distal to phalangeal joint and prominence between the 2 toes     Assessment:  Hammertoe deformity second right exostosis hallux right both of them painful     Plan:  H&P conditions reviewed and at this point I did discuss surgical intervention in this case with arthroplasty second digit exostectomy of the hallux.  Patient is can hold off but that may be necessary and at this point I applied padding and if this is not successful surgery will be necessary

## 2019-02-26 DIAGNOSIS — M25551 Pain in right hip: Secondary | ICD-10-CM | POA: Diagnosis not present

## 2019-03-11 ENCOUNTER — Other Ambulatory Visit: Payer: Self-pay | Admitting: Certified Nurse Midwife

## 2019-03-11 DIAGNOSIS — R6882 Decreased libido: Secondary | ICD-10-CM

## 2019-03-11 MED ORDER — NONFORMULARY OR COMPOUNDED ITEM: 2 refills | Status: DC

## 2019-03-11 NOTE — Telephone Encounter (Signed)
Medication refill request: testosterone  Last AEX:  08/08/18 DL Next AEX: 08/20/19 DL Last MMG (if hormonal medication request): 09/05/18 BIRADS1:neg  Refill authorized: 08/08/18 #[id number]. Today please advise.

## 2019-03-11 NOTE — Telephone Encounter (Signed)
Rx faxed to [facility]

## 2019-03-11 NOTE — Telephone Encounter (Signed)
Patient left voicemail over lunch requesting a refill for her testosterone propionate petrolatum. Patient confirmed pharmacy as [facility].

## 2019-03-12 ENCOUNTER — Other Ambulatory Visit: Payer: Self-pay | Admitting: Obstetrics & Gynecology

## 2019-03-12 DIAGNOSIS — R6882 Decreased libido: Secondary | ICD-10-CM

## 2019-03-12 MED ORDER — NONFORMULARY OR COMPOUNDED ITEM: 1 refills | Status: DC

## 2019-05-21 DIAGNOSIS — K219 Gastro-esophageal reflux disease without esophagitis: Secondary | ICD-10-CM | POA: Diagnosis not present

## 2019-05-21 DIAGNOSIS — I10 Essential (primary) hypertension: Secondary | ICD-10-CM | POA: Diagnosis not present

## 2019-05-21 DIAGNOSIS — R000 Tachycardia, unspecified: Secondary | ICD-10-CM | POA: Diagnosis not present

## 2019-06-06 ENCOUNTER — Other Ambulatory Visit: Payer: Self-pay | Admitting: Certified Nurse Midwife

## 2019-06-06 NOTE — Telephone Encounter (Signed)
Medication refill request: [email or web address]  Last AEX:  08-08-18 DL  Next AEX: 08-20-2019 Last MMG (if hormonal medication request): 09-05-18 density 1/BIRADS 1 negative  Refill authorized: Today, please advise.   Medication pended for #12, 1RF. Refill if appropriate.

## 2019-06-12 DIAGNOSIS — I10 Essential (primary) hypertension: Secondary | ICD-10-CM | POA: Diagnosis not present

## 2019-06-12 DIAGNOSIS — K219 Gastro-esophageal reflux disease without esophagitis: Secondary | ICD-10-CM | POA: Diagnosis not present

## 2019-06-12 DIAGNOSIS — K649 Unspecified hemorrhoids: Secondary | ICD-10-CM | POA: Diagnosis not present

## 2019-06-16 DIAGNOSIS — K219 Gastro-esophageal reflux disease without esophagitis: Secondary | ICD-10-CM | POA: Diagnosis not present

## 2019-06-19 ENCOUNTER — Encounter: Payer: Self-pay | Admitting: Podiatry

## 2019-06-19 ENCOUNTER — Ambulatory Visit: Payer: PPO | Admitting: Podiatry

## 2019-06-19 ENCOUNTER — Other Ambulatory Visit: Payer: Self-pay

## 2019-06-19 ENCOUNTER — Ambulatory Visit: Payer: PPO

## 2019-06-19 DIAGNOSIS — M2041 Other hammer toe(s) (acquired), right foot: Secondary | ICD-10-CM | POA: Diagnosis not present

## 2019-06-19 DIAGNOSIS — D169 Benign neoplasm of bone and articular cartilage, unspecified: Secondary | ICD-10-CM

## 2019-06-19 NOTE — Patient Instructions (Signed)
Pre-Operative Instructions  Congratulations, you have decided to take an important step towards improving your quality of life.  You can be assured that the doctors and staff at [facility] will be with you every step of the way.  Here are some important things you should know:  1. Plan to be at the surgery center/hospital at least 1 (one) hour prior to your scheduled time, unless otherwise directed by the surgical center/hospital staff.  You must have a responsible adult accompany you, remain during the surgery and drive you home.  Make sure you have directions to the surgical center/hospital to ensure you arrive on time. 2. If you are having surgery at [facility] or [facility] hospitals, you will need a copy of your medical history and physical form from your family physician within one month prior to the date of surgery. We will give you a form for your primary physician to complete.  3. We make every effort to accommodate the date you request for surgery.  However, there are times where surgery dates or times have to be moved.  We will contact you as soon as possible if a change in schedule is required.   4. No aspirin/ibuprofen for one week before surgery.  If you are on aspirin, any non-steroidal anti-inflammatory medications (Mobic, Aleve, Ibuprofen) should not be taken seven (7) days prior to your surgery.  You make take Tylenol for pain prior to surgery.  5. Medications - If you are taking daily heart and blood pressure medications, seizure, reflux, allergy, asthma, anxiety, pain or diabetes medications, make sure you notify the surgery center/hospital before the day of surgery so they can tell you which medications you should take or avoid the day of surgery. 6. No food or drink after midnight the night before surgery unless directed otherwise by surgical center/hospital staff. 7. No alcoholic beverages 24-hours prior to surgery.  No smoking 24-hours prior or 24-hours after  surgery. 8. Wear loose pants or shorts. They should be loose enough to fit over bandages, boots, and casts. 9. Don't wear slip-on shoes. Sneakers are preferred. 10. Bring your boot with you to the surgery center/hospital.  Also bring crutches or a walker if your physician has prescribed it for you.  If you do not have this equipment, it will be provided for you after surgery. 11. If you have not been contacted by the surgery center/hospital by the day before your surgery, call to confirm the date and time of your surgery. 12. Leave-time from work may vary depending on the type of surgery you have.  Appropriate arrangements should be made prior to surgery with your employer. 13. Prescriptions will be provided immediately following surgery by your doctor.  Fill these as soon as possible after surgery and take the medication as directed. Pain medications will not be refilled on weekends and must be approved by the doctor. 14. Remove nail polish on the operative foot and avoid getting pedicures prior to surgery. 15. Wash the night before surgery.  The night before surgery wash the foot and leg well with water and the antibacterial soap provided. Be sure to pay special attention to beneath the toenails and in between the toes.  Wash for at least three (3) minutes. Rinse thoroughly with water and dry well with a towel.  Perform this wash unless told not to do so by your physician.  Enclosed: 1 Ice pack (please put in freezer the night before surgery)   1 Hibiclens skin cleaner     Pre-op instructions  If you have any questions regarding the instructions, please do not hesitate to call our office.  [place]: [address], [place], [place] [address] -- [phone]  [place]: [address] [place], [place] [address] -- [phone]  [place]: [address]  [place], [place] [address] -- [phone]  [place]: [address], [address], [place], [place] [address] -- [phone]  Website:  [email or web address] 

## 2019-06-20 NOTE — Progress Notes (Signed)
Subjective:   Patient ID: [name], female   DOB: 85 y.o.   MRN: [id number]   HPI Patient presents stating she has to get her toes fixed as they are killing her and making it hard for her to wear shoe gear and she is tried padding and cushioning without relief   ROS      Objective:  Physical Exam  Neurovascular status intact with patient found to have good digital perfusion and does have a distal keratotic lesion digit to right with slight breakdown of tissue on the medial side but no redness or drainage noted and has a exostotic lesion of the right hallux that is pressing against the second toe     Assessment:  Chronic lesion between the big toe second toe right creating chronic structural issues and slight breakdown of tissue second digit right without indication of infection     Plan:  H&P reviewed condition and at this point I do recommend digital surgery and exostectomy along with padding for the next few weeks to try to reduce any symptoms prior to procedure.  I allowed her to go over consent form reviewing alternative treatments complications and I did discuss that there is possible slight reduction of blood flow to the digits even though she is got good palpable pulses and that I cannot give a guarantee of healing but due to the fact she is developing this mild breakdown we have no choice but surgical intervention.  Patient agrees with me completely want surgery understands risks and signed consent form and understands recovery can take upwards of 6 months.  Patient is scheduled for outpatient surgery and is encouraged to call with any questions concerns she may have and will utilize padding to reduce any pressure on the toe at the time until we do the procedure in the next several weeks  X-rays indicate that there is significant pressure between the big toe second toe right with spur formation on both digits but no indications of ostial lysis

## 2019-06-25 DIAGNOSIS — Z87891 Personal history of nicotine dependence: Secondary | ICD-10-CM | POA: Diagnosis not present

## 2019-06-25 DIAGNOSIS — Z7289 Other problems related to lifestyle: Secondary | ICD-10-CM | POA: Diagnosis not present

## 2019-06-25 DIAGNOSIS — H6123 Impacted cerumen, bilateral: Secondary | ICD-10-CM | POA: Diagnosis not present

## 2019-06-25 DIAGNOSIS — H903 Sensorineural hearing loss, bilateral: Secondary | ICD-10-CM | POA: Diagnosis not present

## 2019-06-25 DIAGNOSIS — Z974 Presence of external hearing-aid: Secondary | ICD-10-CM | POA: Diagnosis not present

## 2019-06-30 DIAGNOSIS — M4690 Unspecified inflammatory spondylopathy, site unspecified: Secondary | ICD-10-CM | POA: Diagnosis not present

## 2019-06-30 DIAGNOSIS — M154 Erosive (osteo)arthritis: Secondary | ICD-10-CM | POA: Diagnosis not present

## 2019-06-30 DIAGNOSIS — Z1589 Genetic susceptibility to other disease: Secondary | ICD-10-CM | POA: Diagnosis not present

## 2019-06-30 DIAGNOSIS — Z79899 Other long term (current) drug therapy: Secondary | ICD-10-CM | POA: Diagnosis not present

## 2019-06-30 DIAGNOSIS — M150 Primary generalized (osteo)arthritis: Secondary | ICD-10-CM | POA: Diagnosis not present

## 2019-07-14 DIAGNOSIS — Z1159 Encounter for screening for other viral diseases: Secondary | ICD-10-CM | POA: Diagnosis not present

## 2019-07-15 ENCOUNTER — Encounter (HOSPITAL_BASED_OUTPATIENT_CLINIC_OR_DEPARTMENT_OTHER): Payer: PPO | Attending: Internal Medicine

## 2019-07-15 ENCOUNTER — Other Ambulatory Visit: Payer: Self-pay

## 2019-07-15 DIAGNOSIS — S81812A Laceration without foreign body, left lower leg, initial encounter: Secondary | ICD-10-CM | POA: Diagnosis not present

## 2019-07-15 DIAGNOSIS — I87301 Chronic venous hypertension (idiopathic) without complications of right lower extremity: Secondary | ICD-10-CM | POA: Diagnosis not present

## 2019-07-15 DIAGNOSIS — I10 Essential (primary) hypertension: Secondary | ICD-10-CM | POA: Diagnosis not present

## 2019-07-15 DIAGNOSIS — W228XXA Striking against or struck by other objects, initial encounter: Secondary | ICD-10-CM | POA: Diagnosis not present

## 2019-07-15 DIAGNOSIS — I7300 Raynaud's syndrome without gangrene: Secondary | ICD-10-CM | POA: Insufficient documentation

## 2019-07-15 DIAGNOSIS — H905 Unspecified sensorineural hearing loss: Secondary | ICD-10-CM | POA: Insufficient documentation

## 2019-07-17 DIAGNOSIS — K219 Gastro-esophageal reflux disease without esophagitis: Secondary | ICD-10-CM | POA: Diagnosis not present

## 2019-07-17 DIAGNOSIS — K2930 Chronic superficial gastritis without bleeding: Secondary | ICD-10-CM | POA: Diagnosis not present

## 2019-07-17 DIAGNOSIS — K449 Diaphragmatic hernia without obstruction or gangrene: Secondary | ICD-10-CM | POA: Diagnosis not present

## 2019-07-17 DIAGNOSIS — K269 Duodenal ulcer, unspecified as acute or chronic, without hemorrhage or perforation: Secondary | ICD-10-CM | POA: Diagnosis not present

## 2019-07-17 DIAGNOSIS — K2950 Unspecified chronic gastritis without bleeding: Secondary | ICD-10-CM | POA: Diagnosis not present

## 2019-07-21 ENCOUNTER — Other Ambulatory Visit: Payer: Self-pay

## 2019-07-21 ENCOUNTER — Encounter (HOSPITAL_BASED_OUTPATIENT_CLINIC_OR_DEPARTMENT_OTHER): Payer: PPO | Attending: Internal Medicine | Admitting: Internal Medicine

## 2019-07-21 ENCOUNTER — Telehealth: Payer: Self-pay | Admitting: *Deleted

## 2019-07-21 DIAGNOSIS — M0230 Reiter's disease, unspecified site: Secondary | ICD-10-CM | POA: Diagnosis not present

## 2019-07-21 DIAGNOSIS — I87393 Chronic venous hypertension (idiopathic) with other complications of bilateral lower extremity: Secondary | ICD-10-CM | POA: Diagnosis not present

## 2019-07-21 DIAGNOSIS — S81812A Laceration without foreign body, left lower leg, initial encounter: Secondary | ICD-10-CM | POA: Insufficient documentation

## 2019-07-21 DIAGNOSIS — W228XXA Striking against or struck by other objects, initial encounter: Secondary | ICD-10-CM | POA: Insufficient documentation

## 2019-07-21 DIAGNOSIS — L97822 Non-pressure chronic ulcer of other part of left lower leg with fat layer exposed: Secondary | ICD-10-CM | POA: Diagnosis not present

## 2019-07-21 NOTE — Progress Notes (Signed)
[name] ([id number]) Visit Report for 07/21/2019 HPI Details Patient Name: Date of Service: [name], [name] 07/21/2019 3:45 PM Medical Record [id number] Patient Account Number: [id number] Date of Birth/Sex: Treating RN: [birth year] (85 y.o. [name] Primary Care Provider: Simona Huh Other Clinician: Referring Provider: Treating Provider/Extender:Jaedynn Bohlken, Vivi Barrack, Melvern Sample in Treatment: 0 History of Present Illness Location: left lower extremity on the anterior shin Quality: Patient reports experiencing a dull pain to affected area(s). Severity: Patient states wound(s) are getting worse. Duration: Patient has had the wound for > 2 months prior to seeking treatment at the wound center Timing: Pain in wound is Intermittent (comes and goes Context: The wound occurred when the patient had a blunt injury against the tub Modifying Factors: Patient wound(s)/ulcer(s) are worsening due to :initially it was getting better and then started breaking open and draining Associated Signs and Symptoms: Patient reports having increase discharge. HPI Description: Thispleasant 85 year old who looks much younger than her stated age had a blunt injury to her left leg and has had an open wound which was taken care of at the urgent care. Was better initially but then started getting worse when she started applying Vaseline over this. no significant past medical history. ====== Old notes this patient suffered a traumatic laceration on her left leg 16 days ago against her bathtub. She was seen in urgent care and it was felt that her skin was too fragile to sutures so it was Steri-Stripped. Some 10 days later the Steri- Strips were removed. The wounds wound became what sounds to be infected with swelling and pain. She was put on anti-biotics. She is here for our review of this laceration.she is not a diabetic she does not have prior wound history. She is active walking up to  5 miles per day without difficulty. 04/16/15; the wound continues to be stable. She has undermining on its medial aspects. Her is no evidence of infection or ischemia. She does have stasis physiology. 04/21/2015 -- comes in earlier because of increasing pain over the left lower extremity ulceration and increasing discharge possibly purulent. The nurse was concerned enough to make it a doctor visit. No fever. 04/30/15 she comes in with a new traumatic wound [patio wall] area on her upper left leg. The original wound appears to be healthier. 05/07/15; the patient fell on the new traumatic area on her upper left leg. The original wound continues to be healthier. 06/04/15; both of the wound areas appear much better than the last time I saw these. Only a small open area remains of her original wound and the subsequent wound on the upper leg. There is no evidence of infection. both of these wounds are traumatic, 06/18/15 considerable improvement since I last saw these areas. No evidence of infection 06/25/15; the superior traumatic wound has resolved. The original inferior wound has only a small open area remaining, certainly no better on the silver alginate versus last week 07/09/15; all the wound areas have resolved. She has underlining venous stasis although both of these wound areas were trauma. No additional measures unless she returns ======== READMISSION 07/15/2019 Patient is now an 85 year old woman who was previously here in 2016 and 2018. Cared for at the time by Dr. Con Memos. Both times with wounds related to trauma in the left leg. She was felt to have underlying venous insufficiency although both occasions were related to trauma. The patient tells me that 2 weeks ago she hit her lateral left leg on the car  door. This was a skin tear with a flap for a period of time she has been using Polysporin. The flap came off recently she has a clean looking superficial wound on the left lateral leg. Our intake  nurse reported some drainage. Past medical history; includes sensorineural hearing loss, osteoarthritis, hypertension and a hammertoe on the right foot for which she follows with podiatry. ABIs in our clinic were 1.19 on the left 07/21/2019; the patient did not like the wraps. They slid down and rub the wound the wound is measuring larger she is upset. Electronic Signature(s) Signed: 07/21/2019 6:11:18 PM By: Linton Ham MD Entered By: Linton Ham on 07/21/2019 17:34:57 -------------------------------------------------------------------------------- Physical Exam Details Patient Name: Date of Service: [name], [name] 07/21/2019 3:45 PM Medical Record [id number] Patient Account Number: [id number] Date of Birth/Sex: Treating RN: [birth year] (85 y.o. [name] Primary Care Provider: Simona Huh Other Clinician: Referring Provider: Treating Provider/Extender:Mirabella Hilario, Vivi Barrack, Melvern Sample in Treatment: 0 Constitutional Patient is hypertensive.. Pulse regular and within target range for patient.Marland Kitchen Respirations regular, non-labored and within target range.. Temperature is normal and within the target range for the patient.Marland Kitchen Appears in no distress. Eyes Conjunctivae clear. No discharge.no icterus. Respiratory work of breathing is normal. Cardiovascular Pedal pulses are palpable. There is no edema. Integumentary (Hair, Skin) No evidence of surrounding infection. Psychiatric appears at normal baseline. Notes Wound exam; wound is measuring larger in terms of surface area however there is epithelialization in the middle of this. There is no surrounding erythema. Electronic Signature(s) Signed: 07/21/2019 6:11:18 PM By: Linton Ham MD Entered By: Linton Ham on 07/21/2019 17:38:10 -------------------------------------------------------------------------------- Physician Orders Details Patient Name: Date of Service: [name], [name] 07/21/2019 3:45  PM Medical Record [id number] Patient Account Number: [id number] Date of Birth/Sex: Treating RN: 09/30/1933 (85 y.o. [name] Primary Care Provider: Simona Huh Other Clinician: Referring Provider: Treating Provider/Extender:Alaijah Gibler, Vivi Barrack, Melvern Sample in Treatment: 0 Verbal / Phone Orders: No Diagnosis Coding ICD-10 Coding Code Description [phone] Laceration without foreign body, left lower leg, subsequent encounter I87.301 Chronic venous hypertension (idiopathic) without complications of right lower extremity Follow-up Appointments Return Appointment in 1 week. Dressing Change Frequency Wound #4 Left,Lateral Lower Leg Change Dressing every other day. Wound Cleansing Wound #4 Left,Lateral Lower Leg May shower and wash wound with soap and water. Primary Wound Dressing Wound #4 Left,Lateral Lower Leg Hydrofera Blue Secondary Dressing Wound #4 Left,Lateral Lower Leg Kerlix/Rolled Gauze - secure with tape Dry Gauze Edema Control Elevate legs to the level of the heart or above for 30 minutes daily and/or when sitting, a frequency of: - throughout the day Electronic Signature(s) Signed: 07/21/2019 6:11:18 PM By: Linton Ham MD Signed: 07/21/2019 6:17:12 PM By: Levan Hurst RN, BSN Entered By: Levan Hurst on 07/21/2019 17:06:30 -------------------------------------------------------------------------------- Problem List Details Patient Name: Date of Service: [name], [name] 07/21/2019 3:45 PM Medical Record [id number] Patient Account Number: [id number] Date of Birth/Sex: Treating RN: Dec 17, 1932 (85 y.o. [name] Primary Care Provider: Simona Huh Other Clinician: Referring Provider: Treating Provider/Extender:Berdena Cisek, Vivi Barrack, Melvern Sample in Treatment: 0 Active Problems ICD-10 Evaluated Encounter Code Description Active Date Today Diagnosis S81.812D Laceration without foreign body, left lower leg,  07/15/2019 No Yes subsequent encounter I87.301 Chronic venous hypertension (idiopathic) without [id number] No Yes complications of right lower extremity Inactive Problems Resolved Problems Electronic Signature(s) Signed: 07/21/2019 6:11:18 PM By: Linton Ham MD Entered By: Linton Ham on 07/21/2019 17:33:25 -------------------------------------------------------------------------------- Progress Note Details Patient Name: Date of Service: [name],  [name] L. 07/21/2019 3:45 PM Medical Record [id number] Patient Account Number: [id number] Date of Birth/Sex: Treating RN: 08-Jul-1933 (85 y.o. [name] Primary Care Provider: Simona Huh Other Clinician: Referring Provider: Treating Provider/Extender:Earnstine Meinders, Vivi Barrack, Melvern Sample in Treatment: 0 Subjective History of Present Illness (HPI) The following HPI elements were documented for the patient's wound: Location: left lower extremity on the anterior shin Quality: Patient reports experiencing a dull pain to affected area(s). Severity: Patient states wound(s) are getting worse. Duration: Patient has had the wound for > 2 months prior to seeking treatment at the wound center Timing: Pain in wound is Intermittent (comes and goes Context: The wound occurred when the patient had a blunt injury against the tub Modifying Factors: Patient wound(s)/ulcer(s) are worsening due to :initially it was getting better and then started breaking open and draining Associated Signs and Symptoms: Patient reports having increase discharge. Thispleasant 85 year old who looks much younger than her stated age had a blunt injury to her left leg and has had an open wound which was taken care of at the urgent care. Was better initially but then started getting worse when she started applying Vaseline over this. no significant past medical history. ====== Old notes this patient suffered a traumatic laceration on her left leg 16 days  ago against her bathtub. She was seen in urgent care and it was felt that her skin was too fragile to sutures so it was Steri-Stripped. Some 10 days later the Steri- Strips were removed. The wounds wound became what sounds to be infected with swelling and pain. She was put on anti-biotics. She is here for our review of this laceration.she is not a diabetic she does not have prior wound history. She is active walking up to 5 miles per day without difficulty. 04/16/15; the wound continues to be stable. She has undermining on its medial aspects. Her is no evidence of infection or ischemia. She does have stasis physiology. 04/21/2015 -- comes in earlier because of increasing pain over the left lower extremity ulceration and increasing discharge possibly purulent. The nurse was concerned enough to make it a doctor visit. No fever. 04/30/15 she comes in with a new traumatic wound [patio wall] area on her upper left leg. The original wound appears to be healthier. 05/07/15; the patient fell on the new traumatic area on her upper left leg. The original wound continues to be healthier. 06/04/15; both of the wound areas appear much better than the last time I saw these. Only a small open area remains of her original wound and the subsequent wound on the upper leg. There is no evidence of infection. both of these wounds are traumatic, 06/18/15 considerable improvement since I last saw these areas. No evidence of infection 06/25/15; the superior traumatic wound has resolved. The original inferior wound has only a small open area remaining, certainly no better on the silver alginate versus last week 07/09/15; all the wound areas have resolved. She has underlining venous stasis although both of these wound areas were trauma. No additional measures unless she returns ======== READMISSION 07/15/2019 Patient is now an 85 year old woman who was previously here in 2016 and 2018. Cared for at the time by Dr. Con Memos. Both  times with wounds related to trauma in the left leg. She was felt to have underlying venous insufficiency although both occasions were related to trauma. The patient tells me that 2 weeks ago she hit her lateral left leg on the car door. This was a skin  tear with a flap for a period of time she has been using Polysporin. The flap came off recently she has a clean looking superficial wound on the left lateral leg. Our intake nurse reported some drainage. Past medical history; includes sensorineural hearing loss, osteoarthritis, hypertension and a hammertoe on the right foot for which she follows with podiatry. ABIs in our clinic were 1.19 on the left 07/21/2019; the patient did not like the wraps. They slid down and rub the wound the wound is measuring larger she is upset. Objective Constitutional Patient is hypertensive.. Pulse regular and within target range for patient.Marland Kitchen Respirations regular, non-labored and within target range.. Temperature is normal and within the target range for the patient.Marland Kitchen Appears in no distress. Vitals Time Taken: 4:36 PM, Height: 62 in, Weight: 105 lbs, BMI: 19.2, Temperature: 98.3 F, Pulse: 50 bpm, Respiratory Rate: 18 breaths/min, Blood Pressure: 175/58 mmHg. General Notes: no sign/symptoms of dizziness, ringing in the ears or headache. MD notified Eyes Conjunctivae clear. No discharge.no icterus. Respiratory work of breathing is normal. Cardiovascular Pedal pulses are palpable. There is no edema. Psychiatric appears at normal baseline. General Notes: Wound exam; wound is measuring larger in terms of surface area however there is epithelialization in the middle of this. There is no surrounding erythema. Integumentary (Hair, Skin) No evidence of surrounding infection. Wound #4 status is Open. Original cause of wound was Trauma. The wound is located on the Left,Lateral Lower Leg. The wound measures 6.1cm length x 3cm width x 0.1cm depth; 14.373cm^2 area and  1.437cm^3 volume. There is Fat Layer (Subcutaneous Tissue) Exposed exposed. There is no tunneling or undermining noted. There is a medium amount of serosanguineous drainage noted. The wound margin is flat and intact. There is large (67-100%) red, hyper - granulation within the wound bed. There is no necrotic tissue within the wound bed. Assessment Active Problems ICD-10 Laceration without foreign body, left lower leg, subsequent encounter Chronic venous hypertension (idiopathic) without complications of right lower extremity Plan Follow-up Appointments: Return Appointment in 1 week. Dressing Change Frequency: Wound #4 Left,Lateral Lower Leg: Change Dressing every other day. Wound Cleansing: Wound #4 Left,Lateral Lower Leg: May shower and wash wound with soap and water. Primary Wound Dressing: Wound #4 Left,Lateral Lower Leg: Hydrofera Blue Secondary Dressing: Wound #4 Left,Lateral Lower Leg: Kerlix/Rolled Gauze - secure with tape Dry Gauze Edema Control: Elevate legs to the level of the heart or above for 30 minutes daily and/or when sitting, a frequency of: - throughout the day 1. Continue with Hydrofera Blue under border foam change every second day 2. The patient had a strong desire to change this herself Electronic Signature(s) Signed: 07/21/2019 6:11:18 PM By: Linton Ham MD Entered By: Linton Ham on 07/21/2019 17:39:44 -------------------------------------------------------------------------------- SuperBill Details Patient Name: Date of Service: [name], [name] 07/21/2019 Medical Record [id number] Patient Account Number: [id number] Date of Birth/Sex: Treating RN: [birth year] (85 y.o. [name] Primary Care Provider: Simona Huh Other Clinician: Referring Provider: Treating Provider/Extender:Kaeleigh Westendorf, Vivi Barrack, Melvern Sample in Treatment: 0 Diagnosis Coding ICD-10 Codes Code Description [phone] Laceration without foreign body, left  lower leg, subsequent encounter I87.301 Chronic venous hypertension (idiopathic) without complications of right lower extremity Facility Procedures CPT4 Code: [id number] Description: [id number] - WOUND CARE VISIT-LEV 3 EST PT Modifier: Quantity: 1 Physician Procedures Electronic Signature(s) Signed: 07/21/2019 6:11:18 PM By: Linton Ham MD Signed: 07/21/2019 6:17:12 PM By: Levan Hurst RN, BSN Entered By: Levan Hurst on 07/21/2019 18:06:52

## 2019-07-21 NOTE — Telephone Encounter (Signed)
DOS 07/22/2019 HAMMER TOE REPAIR 2ND DISTAL - [id number] AND EXOSTECTOMY HALLUX - [id number] OF THE RIGHT FOOT.  [employer]: Authorization # [id number] Valid from 07/22/2019 to 10/20/2019

## 2019-07-22 ENCOUNTER — Encounter: Payer: Self-pay | Admitting: Podiatry

## 2019-07-22 DIAGNOSIS — I10 Essential (primary) hypertension: Secondary | ICD-10-CM | POA: Diagnosis not present

## 2019-07-22 DIAGNOSIS — M898X7 Other specified disorders of bone, ankle and foot: Secondary | ICD-10-CM | POA: Diagnosis not present

## 2019-07-22 DIAGNOSIS — M2041 Other hammer toe(s) (acquired), right foot: Secondary | ICD-10-CM | POA: Diagnosis not present

## 2019-07-22 DIAGNOSIS — M25774 Osteophyte, right foot: Secondary | ICD-10-CM | POA: Diagnosis not present

## 2019-07-22 DIAGNOSIS — M2570 Osteophyte, unspecified joint: Secondary | ICD-10-CM | POA: Diagnosis not present

## 2019-07-23 DIAGNOSIS — K2930 Chronic superficial gastritis without bleeding: Secondary | ICD-10-CM | POA: Diagnosis not present

## 2019-07-24 NOTE — Progress Notes (Signed)
[name] ([id number]) Visit Report for 07/21/2019 Arrival Information Details Patient Name: Date of Service: [name], [name] 07/21/2019 3:45 PM Medical Record [id number] Patient Account Number: [id number] Date of Birth/Sex: Treating RN: 1933-09-24 (85 y.o. [name] Primary Care [name]: [name] Other Clinician: Referring [name]: Treating [name]/Extender:Robson, Vivi Barrack, Melvern Sample in Treatment: 0 Visit Information History Since Last Visit Added or deleted any medications: Yes Patient Arrived: Ambulatory Any new allergies or adverse reactions: No Arrival Time: 16:36 Had a fall or experienced change in No Accompanied By: self activities of daily living that may affect Transfer Assistance: None risk of falls: Patient Requires Transmission-Based No Signs or symptoms of abuse/neglect since last No Precautions: visito Patient Has Alerts: No Hospitalized since last visit: No Implantable device outside of the clinic excluding No cellular tissue based products placed in the center since last visit: Has Dressing in Place as Prescribed: Yes Has Compression in Place as Prescribed: Yes Pain Present Now: Yes Electronic Signature(s) Signed: 07/24/2019 4:29:59 PM By: [name] Entered By: [name] on 07/21/2019 16:36:40 -------------------------------------------------------------------------------- Clinic Level of Care Assessment Details Patient Name: Date of Service: [name], [name] 07/21/2019 3:45 PM Medical Record [id number] Patient Account Number: [id number] Date of Birth/Sex: Treating RN: 11/20/1932 (85 y.o. [name] Primary Care [name]: [name] Other Clinician: Referring [name]: Treating [name]/Extender:Robson, Vivi Barrack, Melvern Sample in Treatment: 0 Clinic Level of Care Assessment Items TOOL 4 Quantity Score X - Use when only an EandM is performed on FOLLOW-UP visit 1  0 ASSESSMENTS - Nursing Assessment / Reassessment X - Reassessment of Co-morbidities (includes updates in patient status) 1 10 X - Reassessment of Adherence to Treatment Plan 1 5 ASSESSMENTS - Wound and Skin Assessment / Reassessment X - Simple Wound Assessment / Reassessment - one wound 1 5 []  - Complex Wound Assessment / Reassessment - multiple wounds 0 []  - Dermatologic / Skin Assessment (not related to wound area) 0 ASSESSMENTS - Focused Assessment []  - Circumferential Edema Measurements - multi extremities 0 []  - Nutritional Assessment / Counseling / Intervention 0 X - Lower Extremity Assessment (monofilament, tuning fork, pulses) 1 5 []  - Peripheral Arterial Disease Assessment (using hand held doppler) 0 ASSESSMENTS - Ostomy and/or Continence Assessment and Care []  - Incontinence Assessment and Management 0 []  - Ostomy Care Assessment and Management (repouching, etc.) 0 PROCESS - Coordination of Care X - Simple Patient / Family Education for ongoing care 1 15 []  - Complex (extensive) Patient / Family Education for ongoing care 0 X - Staff obtains Programmer, systems, Records, Test Results / Process Orders 1 10 []  - Staff telephones HHA, Nursing Homes / Clarify orders / etc 0 []  - Routine Transfer to another Facility (non-emergent condition) 0 []  - Routine Hospital Admission (non-emergent condition) 0 []  - New Admissions / Biomedical engineer / Ordering NPWT, Apligraf, etc. 0 []  - Emergency Hospital Admission (emergent condition) 0 X - Simple Discharge Coordination 1 10 []  - Complex (extensive) Discharge Coordination 0 PROCESS - Special Needs []  - Pediatric / Minor Patient Management 0 []  - Isolation Patient Management 0 []  - Hearing / Language / Visual special needs 0 []  - Assessment of Community assistance (transportation, D/C planning, etc.) 0 []  - Additional assistance / Altered mentation 0 []  - Support Surface(s) Assessment (bed, cushion, seat, etc.) 0 INTERVENTIONS - Wound  Cleansing / Measurement X - Simple Wound Cleansing - one wound 1 5 []  - Complex Wound Cleansing - multiple wounds 0 X - Wound  Imaging (photographs - any number of wounds) 1 5 []  - Wound Tracing (instead of photographs) 0 X - Simple Wound Measurement - one wound 1 5 []  - Complex Wound Measurement - multiple wounds 0 INTERVENTIONS - Wound Dressings []  - Small Wound Dressing one or multiple wounds 0 X - Medium Wound Dressing one or multiple wounds 1 15 []  - Large Wound Dressing one or multiple wounds 0 X - Application of Medications - topical 1 5 []  - Application of Medications - injection 0 INTERVENTIONS - Miscellaneous []  - External ear exam 0 []  - Specimen Collection (cultures, biopsies, blood, body fluids, etc.) 0 []  - Specimen(s) / Culture(s) sent or taken to Lab for analysis 0 []  - Patient Transfer (multiple staff / Civil Service fast streamer / Similar devices) 0 []  - Simple Staple / Suture removal (25 or less) 0 []  - Complex Staple / Suture removal (26 or more) 0 []  - Hypo / Hyperglycemic Management (close monitor of Blood Glucose) 0 []  - Ankle / Brachial Index (ABI) - do not check if billed separately 0 X - Vital Signs 1 5 Has the patient been seen at the hospital within the last three years: Yes Total Score: 100 Level Of Care: New/Established - Level 3 Electronic Signature(s) Signed: 07/21/2019 6:17:12 PM By: [name] Hurst RN, BSN Entered By: [name] on 07/21/2019 18:06:44 -------------------------------------------------------------------------------- Encounter Discharge Information Details Patient Name: Date of Service: [name] 07/21/2019 3:45 PM Medical Record [id number] Patient Account Number: [id number] Date of Birth/Sex: Treating RN: December 06, 1932 (85 y.o. [name] Primary Care [name]: [name] Other Clinician: Referring [name]: Treating [name]/Extender:Robson, Vivi Barrack, Melvern Sample in Treatment: 0 Encounter Discharge Information  Items Discharge Condition: Stable Ambulatory Status: Ambulatory Discharge Destination: Home Transportation: Private Auto Accompanied By: self Schedule Follow-up Appointment: Yes Clinical Summary of Care: Electronic Signature(s) Signed: 07/21/2019 5:53:08 PM By: Deon Pilling Entered By: Deon Pilling on 07/21/2019 17:42:46 -------------------------------------------------------------------------------- Lower Extremity Assessment Details Patient Name: Date of Service: SHELDON, SARPONG 07/21/2019 3:45 PM Medical Record [id number] Patient Account Number: [id number] Date of Birth/Sex: Treating RN: 10-23-1932 (85 y.o. [name] Primary Care Cipriano Millikan: [name] Other Clinician: Referring Dajane Valli: Treating Ariany Kesselman/Extender:Robson, Vivi Barrack, Melvern Sample in Treatment: 0 Edema Assessment Assessed: [Left: No] [Right: No] Edema: [Left: Ye] [Right: s] Calf Left: Right: Point of Measurement: cm From Medial Instep 30.8 cm cm Ankle Left: Right: Point of Measurement: cm From Medial Instep 18 cm cm Vascular Assessment Pulses: Dorsalis Pedis Palpable: [Left:Yes] Electronic Signature(s) Signed: 07/24/2019 4:29:59 PM By: [name] Entered By: [name] on 07/21/2019 16:40:28 -------------------------------------------------------------------------------- Multi Wound Chart Details Patient Name: Date of Service: [name] 07/21/2019 3:45 PM Medical Record [id number] Patient Account Number: [id number] Date of Birth/Sex: Treating RN: 10-02-1933 (85 y.o. [name] Primary Care Mutasim Tuckey: [name] Other Clinician: Referring Casey Maxfield: Treating Jeury Mcnab/Extender:Robson, Vivi Barrack, Melvern Sample in Treatment: 0 Vital Signs Height(in): 62 Pulse(bpm): 50 Weight(lbs): 105 Blood Pressure(mmHg): 175/58 Body Mass Index(BMI): 19 Temperature(F): 98.3 Respiratory 18 Rate(breaths/min): Photos: [4:No Photos]  [N/A:N/A] Wound Location: [4:Left Lower Leg - Lateral] [N/A:N/A] Wounding Event: [4:Trauma] [N/A:N/A] Primary Etiology: [4:Venous Leg Ulcer] [N/A:N/A] Comorbid History: [4:Cataracts, Hypertension, N/A Raynauds, Osteoarthritis] Date Acquired: [4:07/01/2019] [N/A:N/A] Weeks of Treatment: [4:0] [N/A:N/A] Wound Status: [4:Open] [N/A:N/A] Clustered Wound: [4:Yes] [N/A:N/A] Measurements L x W x D 6.1x3x0.1 [N/A:N/A] (cm) Area (cm) : [4:14.373] [N/A:N/A] Volume (cm) : [4:1.437] [N/A:N/A] % Reduction in Area: [4:-161.40%] [N/A:N/A] % Reduction in Volume: -161.30% [N/A:N/A] Classification: [4:Full Thickness Without  Exposed Support Structures] [N/A:N/A] Exudate Amount: [4:Medium] [N/A:N/A] Exudate Type: [4:Serosanguineous] [N/A:N/A] Exudate Color: [4:red, brown] [N/A:N/A] Wound Margin: [4:Flat and Intact] [N/A:N/A] Granulation Amount: [4:Large (67-100%)] [N/A:N/A] Granulation Quality: [4:Red, Hyper-granulation] [N/A:N/A] Necrotic Amount: [4:None Present (0%)] [N/A:N/A] Exposed Structures: [4:Fat Layer (Subcutaneous N/A Tissue) Exposed: Yes Fascia: No Tendon: No Muscle: No Joint: No Bone: No Small (1-33%)] [N/A:N/A] Treatment Notes Electronic Signature(s) Signed: 07/21/2019 6:11:18 PM By: Linton Ham MD Signed: 07/21/2019 6:17:12 PM By: [name] Hurst RN, BSN Entered By: Linton Ham on 07/21/2019 17:33:35 -------------------------------------------------------------------------------- Multi-Disciplinary Care Plan Details Patient Name: Date of Service: [name], [name] 07/21/2019 3:45 PM Medical Record [id number] Patient Account Number: [id number] Date of Birth/Sex: Treating RN: 1933-05-07 (85 y.o. [name] Primary Care Irene Mitcham: [name] Other Clinician: Referring Izaak Sahr: Treating AmeLie Hollars/Extender:Robson, Vivi Barrack, Melvern Sample in Treatment: 0 Active Inactive Wound/Skin Impairment Nursing Diagnoses: Knowledge deficit related to  ulceration/compromised skin integrity Goals: Patient/caregiver will verbalize understanding of skin care regimen Date Initiated: 07/15/2019 Target Resolution Date: 08/08/2019 Goal Status: Active Ulcer/skin breakdown will have a volume reduction of 30% by week 4 Date Initiated: 07/15/2019 Target Resolution Date: 08/08/2019 Goal Status: Active Interventions: Assess patient/caregiver ability to obtain necessary supplies Assess patient/caregiver ability to perform ulcer/skin care regimen upon admission and as needed Assess ulceration(s) every visit Notes: Electronic Signature(s) Signed: 07/21/2019 6:17:12 PM By: [name] Hurst RN, BSN Entered By: [name] on 07/21/2019 18:06:15 -------------------------------------------------------------------------------- Pain Assessment Details Patient Name: Date of Service: [name], [name] 07/21/2019 3:45 PM Medical Record [id number] Patient Account Number: [id number] Date of Birth/Sex: Treating RN: [birth year] (85 y.o. [name] Primary Care Kolden Dupee: [name] Other Clinician: Referring Clydean Posas: Treating Jakira Mcfadden/Extender:Robson, Vivi Barrack, Melvern Sample in Treatment: 0 Active Problems Location of Pain Severity and Description of Pain Patient Has Paino Yes Site Locations Pain Location: Pain in Ulcers With Dressing Change: Yes Duration of the Pain. Constant / Intermittento Constant Rate the pain. Current Pain Level: 3 Worst Pain Level: 7 Least Pain Level: 3 Tolerable Pain Level: 3 Character of Pain Describe the Pain: Aching, Burning, Other: stinging Pain Management and Medication Current Pain Management: Electronic Signature(s) Signed: 07/24/2019 4:29:59 PM By: [name] Entered By: [name] on 07/21/2019 16:39:48 -------------------------------------------------------------------------------- Patient/Caregiver Education Details Patient Name: Date of Service: [name]  10/5/2020andnbsp3:45 PM Medical Record [id number] Patient Account Number: [id number] Date of Birth/Gender: August 23, 1933 (85 y.o. F) Treating RN: [name] Primary Care Physician: [name] Other Clinician: Referring Physician: Treating Physician/Extender:Robson, Vivi Barrack, Melvern Sample in Treatment: 0 Education Assessment Education Provided To: Patient Education Topics Provided Wound/Skin Impairment: Methods: Explain/Verbal Responses: State content correctly Electronic Signature(s) Signed: 07/21/2019 6:17:12 PM By: [name] Hurst RN, BSN Entered By: [name] on 07/21/2019 18:06:24 -------------------------------------------------------------------------------- Wound Assessment Details Patient Name: Date of Service: [name], [name] 07/21/2019 3:45 PM Medical Record [id number] Patient Account Number: [id number] Date of Birth/Sex: Treating RN: December 19, 1932 (85 y.o. [name] Primary Care Isaid Salvia: [name] Other Clinician: Referring Brynlie Daza: Treating Alanni Vader/Extender:Robson, Vivi Barrack, Melvern Sample in Treatment: 0 Wound Status Wound Number: 4 Primary Venous Leg Ulcer Etiology: Wound Location: Left Lower Leg - Lateral Wound Open Wounding Event: Trauma Status: Date Acquired: 07/01/2019 Comorbid Cataracts, Hypertension, Raynauds, Weeks Of Treatment: 0 History: Osteoarthritis Clustered Wound: Yes Photos Wound Measurements Length: (cm) 6.1 % Reduct Width: (cm) 3 % Reduct Depth: (cm) 0.1 Epitheli Area: (cm) 14.373 Tunneli Volume: (cm) 1.437 Undermi Wound Description Full Thickness Without Exposed Support Foul Od Classification: Structures Slough/ Wound Flat and  Intact Margin: Exudate Medium Amount: Exudate Serosanguineous Type: Exudate red, brown Color: Wound Bed Granulation Amount: Large (67-100%) Granulation Quality: Red, Hyper-granulation Fascia E Necrotic Amount: None Present (0%) Fat  Laye Tendon E Muscle E Joint Ex Bone Exp or After Cleansing: No Fibrino No Exposed Structure xposed: No r (Subcutaneous Tissue) Exposed: Yes xposed: No xposed: No posed: No osed: No ion in Area: -161.4% ion in Volume: -161.3% alization: Small (1-33%) ng: No ning: No Treatment Notes Wound #4 (Left, Lateral Lower Leg) 1. Cleanse With Wound Cleanser Soap and water 3. Primary Dressing Applied Hydrofera Blue 4. Secondary Dressing Dry Gauze Roll Gauze 5. Secured With Tape Notes netting. Electronic Signature(s) Signed: 07/24/2019 1:38:24 PM By: Mikeal Hawthorne EMT/HBOT Signed: 07/24/2019 4:29:59 PM By: [name] Entered By: Mikeal Hawthorne on 07/23/2019 10:48:24 -------------------------------------------------------------------------------- Vitals Details Patient Name: Date of Service: [name], [name] 07/21/2019 3:45 PM Medical Record [id number] Patient Account Number: [id number] Date of Birth/Sex: Treating RN: 08-20-1933 (85 y.o. [name] Primary Care Rachele Lamaster: [name] Other Clinician: Referring Maciah Schweigert: Treating Kaijah Abts/Extender:Robson, Vivi Barrack, Melvern Sample in Treatment: 0 Vital Signs Time Taken: 16:36 Temperature (F): 98.3 Height (in): 62 Pulse (bpm): 50 Weight (lbs): 105 Respiratory Rate (breaths/min): 18 Body Mass Index (BMI): 19.2 Blood Pressure (mmHg): 175/58 Reference Range: 80 - 120 mg / dl Notes no sign/symptoms of dizziness, ringing in the ears or headache. MD notified Electronic Signature(s) Signed: 07/24/2019 4:29:59 PM By: [name] Entered By: [name] on 07/21/2019 16:39:14

## 2019-07-28 ENCOUNTER — Encounter (HOSPITAL_BASED_OUTPATIENT_CLINIC_OR_DEPARTMENT_OTHER): Payer: PPO | Admitting: Internal Medicine

## 2019-07-28 ENCOUNTER — Encounter: Payer: Self-pay | Admitting: Podiatry

## 2019-07-28 ENCOUNTER — Ambulatory Visit (INDEPENDENT_AMBULATORY_CARE_PROVIDER_SITE_OTHER): Payer: Self-pay | Admitting: Podiatry

## 2019-07-28 ENCOUNTER — Other Ambulatory Visit: Payer: PPO

## 2019-07-28 ENCOUNTER — Ambulatory Visit (INDEPENDENT_AMBULATORY_CARE_PROVIDER_SITE_OTHER): Payer: PPO

## 2019-07-28 ENCOUNTER — Other Ambulatory Visit: Payer: Self-pay

## 2019-07-28 VITALS — Temp 98.4°F

## 2019-07-28 DIAGNOSIS — S81812A Laceration without foreign body, left lower leg, initial encounter: Secondary | ICD-10-CM | POA: Diagnosis not present

## 2019-07-28 DIAGNOSIS — Z09 Encounter for follow-up examination after completed treatment for conditions other than malignant neoplasm: Secondary | ICD-10-CM

## 2019-07-28 DIAGNOSIS — M2041 Other hammer toe(s) (acquired), right foot: Secondary | ICD-10-CM

## 2019-07-28 DIAGNOSIS — L97822 Non-pressure chronic ulcer of other part of left lower leg with fat layer exposed: Secondary | ICD-10-CM | POA: Diagnosis not present

## 2019-07-28 NOTE — Progress Notes (Signed)
[name], [name] ([id number]) Visit Report for 07/28/2019 HPI Details Patient Name: Date of Service: [name], [name] 07/28/2019 3:00 PM Medical Record [id number] Patient Account Number: [id number] Date of Birth/Sex: Treating RN: [birth year] (85 y.o. [name] Primary Care Provider: Simona [name] Other Clinician: Referring Provider: Treating Provider/Extender:[name], [name], [name] in Treatment: 1 History of Present Illness Location: left lower extremity on the anterior shin Quality: Patient reports experiencing a dull pain to affected area(s). Severity: Patient states wound(s) are getting worse. Duration: Patient has had the wound for > 2 months prior to seeking treatment at the wound center Timing: Pain in wound is Intermittent (comes and goes Context: The wound occurred when the patient had a blunt injury against the tub Modifying Factors: Patient wound(s)/ulcer(s) are worsening due to :initially it was getting better and then started breaking open and draining Associated Signs and Symptoms: Patient reports having increase discharge. HPI Description: Thispleasant 85 year old who looks much younger than her stated age had a blunt injury to her left leg and has had an open wound which was taken care of at the urgent care. Was better initially but then started getting worse when she started applying Vaseline over this. no significant past medical history. ====== Old notes this patient suffered a traumatic laceration on her left leg 16 days ago against her bathtub. She was seen in urgent care and it was felt that her skin was too fragile to sutures so it was Steri-Stripped. Some 10 days later the Steri- Strips were removed. The wounds wound became what sounds to be infected with swelling and pain. She was put on anti-biotics. She is here for our review of this laceration.she is not a diabetic she does not have prior wound history. She is active walking up  to 5 miles per day without difficulty. 04/16/15; the wound continues to be stable. She has undermining on its medial aspects. Her is no evidence of infection or ischemia. She does have stasis physiology. 04/20/82 -- comes in earlier because of increasing pain over the left lower extremity ulceration and increasing discharge possibly purulent. The nurse was concerned enough to make it a doctor visit. No fever. 04/30/15 she comes in with a new traumatic wound [patio wall] area on her upper left leg. The original wound appears to be healthier. 05/07/15; the patient fell on the new traumatic area on her upper left leg. The original wound continues to be healthier. 06/04/15; both of the wound areas appear much better than the last time I saw these. Only a small open area remains of her original wound and the subsequent wound on the upper leg. There is no evidence of infection. both of these wounds are traumatic, 06/18/15 considerable improvement since I last saw these areas. No evidence of infection 06/25/15; the superior traumatic wound has resolved. The original inferior wound has only a small open area remaining, certainly no better on the silver alginate versus last week 07/09/15; all the wound areas have resolved. She has underlining venous stasis although both of these wound areas were trauma. No additional measures unless she returns ======== READMISSION 07/15/2019 Patient is now an 85 year old woman who was previously here in 2016 and 2018. Cared for at the time by Dr. Con [name]. Both times with wounds related to trauma in the left leg. She was felt to have underlying venous insufficiency although both occasions were related to trauma. The patient tells me that 2 weeks ago she hit her lateral left leg on the car  door. This was a skin tear with a flap for a period of time she has been using Polysporin. The flap came off recently she has a clean looking superficial wound on the left lateral leg. Our  intake nurse reported some drainage. Past medical history; includes sensorineural hearing loss, osteoarthritis, hypertension and a hammertoe on the right foot for which she follows with podiatry. ABIs in our clinic were 1.19 on the left 07/21/2019; the patient did not like the wraps. They slid down and rub the wound the wound is measuring larger she is upset. 10/12; left lateral leg wound. Most of this looks healthy even under illumination. We have been using Hydrofera Blue under border foam. She would not allow compression Electronic Signature(s) Signed: 07/28/2019 5:50:55 PM By: [name] Ham MD Entered By: [name] on 07/28/2019 17:32:29 -------------------------------------------------------------------------------- Physical Exam Details Patient Name: Date of Service: [name], [name] 07/28/2019 3:00 PM Medical Record [id number] Patient Account Number: [id number] Date of Birth/Sex: Treating RN: 08/24/1933 (85 y.o. [name] Primary Care Provider: Simona [name] Other Clinician: Referring Provider: Treating Provider/Extender:[name], [name], [name] in Treatment: 1 Constitutional Patient is hypertensive.. [name] regular and within target range for patient.Marland Kitchen Respirations regular, non-labored and within target range.. Temperature is normal and within the target range for the patient.Marland Kitchen Appears in no distress. Eyes Conjunctivae clear. No discharge.no icterus. Notes Wound exam; continued epithelialization which is really happening in a somewhat irregular fashion however I think this wound is generally better. No evidence of surrounding erythema Electronic Signature(s) Signed: 07/28/2019 5:50:55 PM By: [name] Ham MD Entered By: [name] on 07/28/2019 17:34:33 -------------------------------------------------------------------------------- Physician Orders Details Patient Name: Date of Service: [name], [name] 07/28/2019 3:00  PM Medical Record [id number] Patient Account Number: [id number] Date of Birth/Sex: Treating RN: 1933/09/18 (85 y.o. [name] Primary Care Provider: Simona [name] Other Clinician: Referring Provider: Treating Provider/Extender:Terius Jacuinde, [name], [name] in Treatment: 1 Verbal / Phone Orders: No Diagnosis Coding Follow-up Appointments Return Appointment in 1 week. Dressing Change Frequency Wound #4 Left,Lateral Lower Leg Change Dressing every other day. Wound Cleansing Wound #4 Left,Lateral Lower Leg May shower and wash wound with soap and water. Primary Wound Dressing Wound #4 Left,Lateral Lower Leg Hydrofera Blue Secondary Dressing Wound #4 Left,Lateral Lower Leg Kerlix/Rolled Gauze - secure with tape Dry Gauze Edema Control Elevate legs to the level of the heart or above for 30 minutes daily and/or when sitting, a frequency of: - throughout the day Electronic Signature(s) Signed: 07/28/2019 5:18:03 PM By: [name] Signed: 07/28/2019 5:50:55 PM By: [name] Ham MD Entered By: [name] on 07/28/2019 16:49:57 -------------------------------------------------------------------------------- Problem List Details Patient Name: Date of Service: [name], [name] 07/28/2019 3:00 PM Medical Record [id number] Patient Account Number: [id number] Date of Birth/Sex: Treating RN: 1933-04-12 (85 y.o. [name] Primary Care Provider: Simona [name] Other Clinician: Referring Provider: Treating Provider/Extender:Tynslee Bowlds, [name], [name] in Treatment: 1 Active Problems ICD-10 Evaluated Encounter Code Description Active Date Today Diagnosis S81.812D Laceration without foreign body, left lower leg, 07/15/2019 No Yes subsequent encounter I87.301 Chronic venous hypertension (idiopathic) without [id number] No Yes complications of right lower extremity Inactive Problems Resolved Problems Electronic  Signature(s) Signed: 07/28/2019 5:50:55 PM By: [name] Ham MD Entered By: [name] on 07/28/2019 17:31:40 -------------------------------------------------------------------------------- Progress Note Details Patient Name: Date of Service: [name] 07/28/2019 3:00 PM Medical Record [id number] Patient Account Number: [id number] Date of Birth/Sex: Treating RN: Sep 08, 1933 (85 y.o. [name] Primary Care Provider: Marisue Humble,  Modena Jansky Other Clinician: Referring Provider: Treating Provider/Extender:Anajah Sterbenz, [name], [name] in Treatment: 1 Subjective History of Present Illness (HPI) The following HPI elements were documented for the patient's wound: Location: left lower extremity on the anterior shin Quality: Patient reports experiencing a dull pain to affected area(s). Severity: Patient states wound(s) are getting worse. Duration: Patient has had the wound for > 2 months prior to seeking treatment at the wound center Timing: Pain in wound is Intermittent (comes and goes Context: The wound occurred when the patient had a blunt injury against the tub Modifying Factors: Patient wound(s)/ulcer(s) are worsening due to :initially it was getting better and then started breaking open and draining Associated Signs and Symptoms: Patient reports having increase discharge. Thispleasant 85 year old who looks much younger than her stated age had a blunt injury to her left leg and has had an open wound which was taken care of at the urgent care. Was better initially but then started getting worse when she started applying Vaseline over this. no significant past medical history. ====== Old notes this patient suffered a traumatic laceration on her left leg 16 days ago against her bathtub. She was seen in urgent care and it was felt that her skin was too fragile to sutures so it was Steri-Stripped. Some 10 days later the Steri- Strips were removed. The wounds  wound became what sounds to be infected with swelling and pain. She was put on anti-biotics. She is here for our review of this laceration.she is not a diabetic she does not have prior wound history. She is active walking up to 5 miles per day without difficulty. 04/16/15; the wound continues to be stable. She has undermining on its medial aspects. Her is no evidence of infection or ischemia. She does have stasis physiology. 04/20/82 -- comes in earlier because of increasing pain over the left lower extremity ulceration and increasing discharge possibly purulent. The nurse was concerned enough to make it a doctor visit. No fever. 04/30/15 she comes in with a new traumatic wound [patio wall] area on her upper left leg. The original wound appears to be healthier. 05/07/15; the patient fell on the new traumatic area on her upper left leg. The original wound continues to be healthier. 06/04/15; both of the wound areas appear much better than the last time I saw these. Only a small open area remains of her original wound and the subsequent wound on the upper leg. There is no evidence of infection. both of these wounds are traumatic, 06/18/15 considerable improvement since I last saw these areas. No evidence of infection 06/25/15; the superior traumatic wound has resolved. The original inferior wound has only a small open area remaining, certainly no better on the silver alginate versus last week 07/09/15; all the wound areas have resolved. She has underlining venous stasis although both of these wound areas were trauma. No additional measures unless she returns ======== READMISSION 07/15/2019 Patient is now an 85 year old woman who was previously here in 2016 and 2018. Cared for at the time by Dr. Con [name]. Both times with wounds related to trauma in the left leg. She was felt to have underlying venous insufficiency although both occasions were related to trauma. The patient tells me that 2 weeks ago she hit  her lateral left leg on the car door. This was a skin tear with a flap for a period of time she has been using Polysporin. The flap came off recently she has a clean looking superficial wound on  the left lateral leg. Our intake nurse reported some drainage. Past medical history; includes sensorineural hearing loss, osteoarthritis, hypertension and a hammertoe on the right foot for which she follows with podiatry. ABIs in our clinic were 1.19 on the left 07/21/2019; the patient did not like the wraps. They slid down and rub the wound the wound is measuring larger she is upset. 10/12; left lateral leg wound. Most of this looks healthy even under illumination. We have been using Hydrofera Blue under border foam. She would not allow compression Objective Constitutional Patient is hypertensive.. [name] regular and within target range for patient.Marland Kitchen Respirations regular, non-labored and within target range.. Temperature is normal and within the target range for the patient.Marland Kitchen Appears in no distress. Vitals Time Taken: 3:22 PM, Height: 62 in, Weight: 105 lbs, BMI: 19.2, Temperature: 98.6 F, [name]: 53 bpm, Respiratory Rate: 18 breaths/min, Blood Pressure: 149/44 mmHg. Eyes Conjunctivae clear. No discharge.no icterus. General Notes: Wound exam; continued epithelialization which is really happening in a somewhat irregular fashion however I think this wound is generally better. No evidence of surrounding erythema Integumentary (Hair, Skin) Wound #4 status is Open. Original cause of wound was Trauma. The wound is located on the Left,Lateral Lower Leg. The wound measures 4.5cm length x 1.8cm width x 0.1cm depth; 6.362cm^2 area and 0.636cm^3 volume. There is Fat Layer (Subcutaneous Tissue) Exposed exposed. There is no tunneling or undermining noted. There is a medium amount of serosanguineous drainage noted. The wound margin is flat and intact. There is large (67-100%) red, hyper - granulation within the  wound bed. There is no necrotic tissue within the wound bed. Assessment Active Problems ICD-10 Laceration without foreign body, left lower leg, subsequent encounter Chronic venous hypertension (idiopathic) without complications of right lower extremity Plan Follow-up Appointments: Return Appointment in 1 week. Dressing Change Frequency: Wound #4 Left,Lateral Lower Leg: Change Dressing every other day. Wound Cleansing: Wound #4 Left,Lateral Lower Leg: May shower and wash wound with soap and water. Primary Wound Dressing: Wound #4 Left,Lateral Lower Leg: Hydrofera Blue Secondary Dressing: Wound #4 Left,Lateral Lower Leg: Kerlix/Rolled Gauze - secure with tape Dry Gauze Edema Control: Elevate legs to the level of the heart or above for 30 minutes daily and/or when sitting, a frequency of: - throughout the day 1. Continue with Hydrofera Blue border foam change every 2. Making good progress Electronic Signature(s) Signed: 07/28/2019 5:50:55 PM By: [name] Ham MD Entered By: [name] on 07/28/2019 17:35:08 -------------------------------------------------------------------------------- SuperBill Details Patient Name: Date of Service: [name], [name] 07/28/2019 Medical Record [id number] Patient Account Number: [id number] Date of Birth/Sex: Treating RN: 1933/01/21 (85 y.o. [name] Primary Care Provider: Simona [name] Other Clinician: Referring Provider: Treating Provider/Extender:[name] Ramone, [name], [name] in Treatment: 1 Diagnosis Coding ICD-10 Codes Code Description [id number] Laceration without foreign body, left lower leg, subsequent encounter I87.301 Chronic venous hypertension (idiopathic) without complications of right lower extremity Facility Procedures CPT4 Code: [id number] Description: 99213 - WOUND CARE VISIT-LEV 3 EST PT Modifier: Quantity: 1 Physician Procedures CPT4 Code Description: [id number] - WC PHYS LEVEL 2 -  EST PT ICD-10 Diagnosis Description S81.812D Laceration without foreign body, left lower leg, subseq I87.301 Chronic venous hypertension (idiopathic) without compli extremity Modifier: uent encounter cations of right Quantity: 1 lower Electronic Signature(s) Signed: 07/28/2019 5:50:55 PM By: [name] Ham MD Previous Signature: 07/28/2019 5:18:03 PM Version By: [name] Entered By: [name] on 07/28/2019 17:35:24

## 2019-07-29 ENCOUNTER — Telehealth: Payer: Self-pay | Admitting: *Deleted

## 2019-07-29 ENCOUNTER — Ambulatory Visit (INDEPENDENT_AMBULATORY_CARE_PROVIDER_SITE_OTHER): Payer: Self-pay | Admitting: Podiatry

## 2019-07-29 VITALS — Temp 98.1°F

## 2019-07-29 DIAGNOSIS — M2041 Other hammer toe(s) (acquired), right foot: Secondary | ICD-10-CM

## 2019-07-29 DIAGNOSIS — M79674 Pain in right toe(s): Secondary | ICD-10-CM

## 2019-07-29 DIAGNOSIS — Z09 Encounter for follow-up examination after completed treatment for conditions other than malignant neoplasm: Secondary | ICD-10-CM

## 2019-07-29 MED ORDER — DOXYCYCLINE HYCLATE 100 MG PO TABS: 100.0000 mg | ORAL_TABLET | Freq: Two times a day (BID) | ORAL | 0 refills | Status: DC

## 2019-07-29 NOTE — Telephone Encounter (Addendum)
I called pt, pt states seen in office yesterday for dressing change and when removed sock for bed her toes had oozed blood and fluid and were stuck together and her foot was swollen, pt states she washed with saline and they are not stuck together as previously but she would like to be seen today. Transferred to schedulers.

## 2019-07-29 NOTE — Telephone Encounter (Signed)
-----   Message from Clarene Reamer sent at 07/29/2019  8:56 AM EDT ----- Regarding: Questions about post surger Pt called has questions about post surgery [phone]

## 2019-07-30 ENCOUNTER — Encounter: Payer: Self-pay | Admitting: Podiatry

## 2019-07-30 DIAGNOSIS — R7989 Other specified abnormal findings of blood chemistry: Secondary | ICD-10-CM | POA: Diagnosis not present

## 2019-07-30 NOTE — Progress Notes (Signed)
Subjective:   Patient ID: [name], female   DOB: 85 y.o.   MRN: [id number]   HPI Patient presents stating I am doing well and I am having minimal discomfort at the current time   ROS      Objective:  Physical Exam  Neurovascular status intact with patient's right hallux and second digit healing well wound edges well coapted digits in good alignment     Assessment:  Doing well post arthroplasty exostectomy right hallux second toe     Plan:  H&P reviewed conditions and reapplied sterile dressings and advised to continue elevation compression and will return 2 weeks suture removal or earlier if needed  X-rays indicate satisfactory resection of bone with good alignment noted

## 2019-07-30 NOTE — Progress Notes (Signed)
Subjective:  Patient ID: [name], female    DOB: 03-08-1933,  MRN: [id number]  Chief Complaint  Patient presents with  . Post-op Problem    " I woke up this morning and my foot was really red, it started draining alot and the swelling has increased. I was afraid it was infected"     DOS: 07/22/2019 Procedure: Right second digit hammertoe and right hallux exostectomy  85 y.o. female returns for post-op check.  Patient is doing well no acute pain.  No nausea fever chills vomiting shortness of breath.  There is some redness developing around the surgical site with increase in swelling.    Review of Systems: Negative except as noted in the HPI. Denies N/V/F/Ch.  Past Medical History:  Diagnosis Date  . Arthritis    "osteoarthritis"  . Diarrhea 08/28/11   "for the last 17 days; from [place] poisoning"  . GERD (gastroesophageal reflux disease)   . Headache(784.0) 08/28/11   "just for the last few days; releated to dehydration"  . Hearing aid worn   . Shortness of breath 08/28/11   "hard time breathing deeply because of the pain"    Current Outpatient Medications:  .  Acetaminophen (TYLENOL ARTHRITIS PAIN PO), Take by mouth., Disp: , Rfl:  .  ALPRAZolam (XANAX) 0.25 MG tablet, TK 1/2 TO 1 T PO BID PRA, Disp: , Rfl:  .  B Complex Vitamins (B-COMPLEX/B-12 PO), Take by mouth daily., Disp: , Rfl:  .  conjugated estrogens (PREMARIN) vaginal cream, PLACE 1/2 gm  VAGINALLY 2 TIMES A WEEK AS DIRECTED, Disp: 30 g, Rfl: 3 .  diclofenac sodium (VOLTAREN) 1 % GEL, , Disp: , Rfl:  .  doxycycline (VIBRA-TABS) 100 MG tablet, Take 1 tablet (100 mg total) by mouth 2 (two) times daily., Disp: 28 tablet, Rfl: 0 .  estradiol (VIVELLE-DOT) 0.025 MG/24HR, APPLY 1/2 PATCH EXTERNALLY TO THE SKIN 2 TIMES A WEEK, Disp: 12 patch, Rfl: 0 .  hydrochlorothiazide (MICROZIDE) 12.5 MG capsule, , Disp: , Rfl:  .  ipratropium (ATROVENT) 0.06 % nasal spray, U 2 SPRAYS IEN TID PRF RHINITIS, Disp: , Rfl: 5 .   meloxicam (MOBIC) 15 MG tablet, Take 15 mg by mouth daily. , Disp: , Rfl:  .  metoprolol succinate (TOPROL-XL) 25 MG 24 hr tablet, TK 1 T PO ONCE A DAY FOR BP AND HEART RATE CONTROL, Disp: , Rfl:  .  NONFORMULARY OR COMPOUNDED ITEM, TESTOSTERONE PROPIONATE 2% PETROLATUM (JAR)  APPLY ONE TEAR DROP SIZE AMOUNT TO VAGINAL AREA AT BEDTIME TWO TIMES WEEKLY., Disp: 60 each, Rfl: 1 .  OVER THE COUNTER MEDICATION, Place 1 drop into both eyes 2 (two) times daily as needed (redness/ dry eyes). Over the counter eye drops, Disp: , Rfl:  .  PROCTOZONE-HC 2.5 % rectal cream, APPLY ONE APPLICATION AA TWICE TO TID AROUND RECTUM, Disp: , Rfl:  .  ranitidine (ZANTAC) 75 MG tablet, Take 75 mg by mouth 2 (two) times daily., Disp: , Rfl:  .  RESTASIS 0.05 % ophthalmic emulsion, INT 1 GTT INTO OU BID, Disp: , Rfl:  .  sulfaSALAzine (AZULFIDINE) 500 MG tablet, TK 1 T PO ONCE A DAY, Disp: , Rfl:  .  triamcinolone cream (KENALOG) 0.1 %, APP TO NAIL 2 TO 3 XD FOR 1 MONTH UTD, Disp: , Rfl:  .  valACYclovir (VALTREX) 1000 MG tablet, as needed. , Disp: , Rfl:  .  zolpidem (AMBIEN) 10 MG tablet, TK 1/2 TO 1 T PO B BED PRN  FOR SLEEP DIFFICULTY, Disp: , Rfl:  .  zolpidem (AMBIEN) 5 MG tablet, Take 1 tablet (5 mg total) by mouth at bedtime as needed for sleep (insomnia)., Disp: 15 tablet, Rfl: 0  Social History   Tobacco Use  Smoking Status Former Smoker  . Packs/day: 1.50  . Years: 30.00  . Pack years: 45.00  . Types: Cigarettes  Smokeless Tobacco Never Used  Tobacco Comment   quit 30 years go     Allergies  Allergen Reactions  . Sulfa Antibiotics     Pt stated, "Upset my stomach"   Objective:   Vitals:   07/29/19 1117  Temp: 98.1 F (36.7 C)   There is no height or weight on file to calculate BMI. Constitutional Well developed. Well nourished.  Vascular Foot warm and well perfused. Capillary refill normal to all digits.   Neurologic Normal speech. Oriented to person, place, and time. Epicritic  sensation to light touch grossly present bilaterally.  Dermatologic Skin healing well without signs of infection. Skin edges well coapted without signs of infection.  Orthopedic: Tenderness to palpation noted about the surgical site.   Radiographs: None Assessment:   1. Hammer toe of right foot   2. Surgery follow-up   3. Pain of toe of right foot    Plan:  Patient was evaluated and treated and all questions answered.  S/p foot surgery right -Progressing as expected post-operatively. -XR: None -WB Status: Weightbearing as tolerated in surgical shoe -Sutures: Are intact.  With no signs of dehiscence -Medications: Doxycycline was sent to pharmacy for 14 days -I instructed her to start using Betadine wet-to-dry dressing changes -Foot redressed.  Return in about 6 days (around 08/04/2019) for regal patient.

## 2019-08-01 ENCOUNTER — Telehealth: Payer: Self-pay | Admitting: Podiatry

## 2019-08-01 NOTE — Telephone Encounter (Signed)
Pt had surgery on 07/22/19 and states that she has had some bleeding through her bandage. She states that it doesn't seem to still be bleeding but there was some blood on the bandage. Pt would like a call from the nurse to make sure she will be okay for the weekend.

## 2019-08-01 NOTE — Telephone Encounter (Signed)
I called pt and she said she had had some bleeding but it looked to be dry now, but she wanted to know what to do before the weekend. I reviewed the last office note of Dr. Posey Pronto and pt was ordered Doxycycline and to perform betadine wet to dry dressings until seen 08/04/2019 by Dr. Paulla Dolly. Pt states she was not given the betadine instructions at her visit. I instructed pt of Betadine dressings daily and to continue the doxycycline.

## 2019-08-04 ENCOUNTER — Encounter: Payer: Self-pay | Admitting: Podiatry

## 2019-08-04 ENCOUNTER — Ambulatory Visit (INDEPENDENT_AMBULATORY_CARE_PROVIDER_SITE_OTHER): Payer: Self-pay | Admitting: Podiatry

## 2019-08-04 ENCOUNTER — Encounter (HOSPITAL_BASED_OUTPATIENT_CLINIC_OR_DEPARTMENT_OTHER): Payer: PPO | Admitting: Internal Medicine

## 2019-08-04 ENCOUNTER — Other Ambulatory Visit: Payer: Self-pay

## 2019-08-04 ENCOUNTER — Ambulatory Visit (INDEPENDENT_AMBULATORY_CARE_PROVIDER_SITE_OTHER): Payer: PPO

## 2019-08-04 DIAGNOSIS — S81812A Laceration without foreign body, left lower leg, initial encounter: Secondary | ICD-10-CM | POA: Diagnosis not present

## 2019-08-04 DIAGNOSIS — M2041 Other hammer toe(s) (acquired), right foot: Secondary | ICD-10-CM

## 2019-08-04 NOTE — Progress Notes (Signed)
Subjective:   Patient ID: [name], female   DOB: 85 y.o.   MRN: [id number]   HPI Patient presents stating doing much better with no drainage or redness noted currently   ROS      Objective:  Physical Exam  Neurovascular status intact with patient's right second digit and hallux healing well wound edges well coapted stitches in place with no redness     Assessment:  Seems to be doing well after having had antibiotic treatment for redness of the right digits     Plan:  Reviewed condition x-ray and recommended the continuation of antibiotics for several more days and reappoint 1 week for suture removal or earlier if needed  X-rays indicate a satisfactory section of bone with no indication of current lysis

## 2019-08-05 ENCOUNTER — Encounter: Payer: Self-pay | Admitting: Sports Medicine

## 2019-08-05 ENCOUNTER — Ambulatory Visit (INDEPENDENT_AMBULATORY_CARE_PROVIDER_SITE_OTHER): Payer: Self-pay | Admitting: Sports Medicine

## 2019-08-05 DIAGNOSIS — Z09 Encounter for follow-up examination after completed treatment for conditions other than malignant neoplasm: Secondary | ICD-10-CM

## 2019-08-05 DIAGNOSIS — S90421A Blister (nonthermal), right great toe, initial encounter: Secondary | ICD-10-CM | POA: Diagnosis not present

## 2019-08-05 DIAGNOSIS — L03031 Cellulitis of right toe: Secondary | ICD-10-CM

## 2019-08-05 DIAGNOSIS — L02611 Cutaneous abscess of right foot: Secondary | ICD-10-CM

## 2019-08-05 MED ORDER — CLINDAMYCIN HCL 150 MG PO CAPS: 150.0000 mg | ORAL_CAPSULE | Freq: Three times a day (TID) | ORAL | 0 refills | Status: DC

## 2019-08-05 NOTE — Progress Notes (Signed)
Subjective: [name] is a 85 y.o. female patient seen today in office for postoperative check patient is status post hammertoe surgery with Dr. Paulla Dolly performed on 07-22-19, patient reports that she thinks her toe was infected on doxycycline and was seen just on yesterday and reports that after her last visit has become very painful put Betadine on it this morning and last night pain was 8 out of 10 today pain is 6 out of 10 currently wearing postoperative shoe and taking Tylenol as needed for pain reports that she is concerned about the redness and swelling especially at her right great toe and wants to have it rechecked to see if the blistered skin is reinfected again.  Patient denies nausea vomiting fever chills or any other constitutional symptoms at this time.  No other issues noted.   Patient Active Problem List   Diagnosis Date Noted  . Presbycusis of both ears 12/02/2018  . S/P reverse total shoulder arthroplasty, left 04/09/2018  . Chronic left shoulder pain 03/20/2018  . Sensorineural hearing loss (SNHL), bilateral 01/03/2017  . Bilateral impacted cerumen 07/03/2016  . Rhinitis, chronic 07/03/2016  . H/O thrombocytosis 01/29/2012  . Neck pain, acute 08/31/2011  . Arthritis of hand 08/31/2011  . Supraventricular tachycardia ([place]) 08/31/2011  . Salmonella enteritis 08/28/2011  . Muscle spasm of back 08/28/2011  . Dehydration 08/28/2011  . GERD (gastroesophageal reflux disease) 08/28/2011  . DJD (degenerative joint disease) 08/28/2011  . Rotator cuff tear, left 08/28/2011  . ARF (acute renal failure) ([place]) 08/28/2011    Current Outpatient Medications on File Prior to Visit  Medication Sig Dispense Refill  . Acetaminophen (TYLENOL ARTHRITIS PAIN PO) Take by mouth.    . ALPRAZolam (XANAX) 0.25 MG tablet TK 1/2 TO 1 T PO BID PRA    . B Complex Vitamins (B-COMPLEX/B-12 PO) Take by mouth daily.    Marland Kitchen conjugated estrogens (PREMARIN) vaginal cream PLACE 1/2 gm  VAGINALLY 2 TIMES A WEEK  AS DIRECTED 30 g 3  . diclofenac sodium (VOLTAREN) 1 % GEL     . doxycycline (VIBRA-TABS) 100 MG tablet Take 1 tablet (100 mg total) by mouth 2 (two) times daily. 28 tablet 0  . estradiol (VIVELLE-DOT) 0.025 MG/24HR APPLY 1/2 PATCH EXTERNALLY TO THE SKIN 2 TIMES A WEEK 12 patch 0  . hydrochlorothiazide (MICROZIDE) 12.5 MG capsule     . HYDROcodone-acetaminophen (NORCO) 10-325 MG tablet Take 1 tablet by mouth every 6 (six) hours as needed.    Marland Kitchen ipratropium (ATROVENT) 0.06 % nasal spray U 2 SPRAYS IEN TID PRF RHINITIS  5  . meloxicam (MOBIC) 15 MG tablet Take 15 mg by mouth daily.     . metoprolol succinate (TOPROL-XL) 25 MG 24 hr tablet TK 1 T PO ONCE A DAY FOR BP AND HEART RATE CONTROL    . NONFORMULARY OR COMPOUNDED ITEM TESTOSTERONE PROPIONATE 2% PETROLATUM (JAR)  APPLY ONE TEAR DROP SIZE AMOUNT TO VAGINAL AREA AT BEDTIME TWO TIMES WEEKLY. 60 each 1  . OVER THE COUNTER MEDICATION Place 1 drop into both eyes 2 (two) times daily as needed (redness/ dry eyes). Over the counter eye drops    . PROCTOZONE-HC 2.5 % rectal cream APPLY ONE APPLICATION AA TWICE TO TID AROUND RECTUM    . ranitidine (ZANTAC) 75 MG tablet Take 75 mg by mouth 2 (two) times daily.    . RESTASIS 0.05 % ophthalmic emulsion INT 1 GTT INTO OU BID    . sulfaSALAzine (AZULFIDINE) 500 MG tablet TK 1  T PO ONCE A DAY    . triamcinolone cream (KENALOG) 0.1 % APP TO NAIL 2 TO 3 XD FOR 1 MONTH UTD    . valACYclovir (VALTREX) 1000 MG tablet as needed.     . zolpidem (AMBIEN) 10 MG tablet TK 1/2 TO 1 T PO B BED PRN FOR SLEEP DIFFICULTY    . zolpidem (AMBIEN) 5 MG tablet Take 1 tablet (5 mg total) by mouth at bedtime as needed for sleep (insomnia). 15 tablet 0   No current facility-administered medications on file prior to visit.     Allergies  Allergen Reactions  . Sulfa Antibiotics     Pt stated, "Upset my stomach"    Objective: There were no vitals filed for this visit.  General: No acute distress, AAOx3  Right foot: Sutures  intact with no gapping or dehiscence at surgical site however at the first interspace there is a ruptured blister noted to the lateral aspect of the right great toe with very minimal clear to bloody drainage, mild swelling to right great toe with focal erythems that improves with elevation, minimal warmth, capillary fill time <3 seconds in all digits, gross sensation present via light touch to right foot.  Guarding with range of motion at first and second toes on right.  No pain with calf compression.   Assessment and Plan:  Problem List Items Addressed This Visit    None    Visit Diagnoses    Blister of great toe of right foot, initial encounter    -  Primary   Relevant Orders   WOUND CULTURE   Cellulitis and abscess of toe of right foot       Surgery follow-up           -Patient seen and evaluated -X-rays from yesterday reviewed -Loose blister skin removed at the first interspace and wound bed was cultured advised patient to continue with doxycycline at this time we will add on additional low-dose of clindamycin we will call patient if her wound culture results are positive and if there is a need for change antibiotics -Applied Betadine dry sterile dressing to surgical site at blister and advised patient to continue with dressings consisting of the same daily and close monitoring of the surgical site and ruptured blister skin area between the toes -Advised patient to continue with post-op shoe on right foot   -Advised patient to limit activity to necessity  -Advised patient to ice and elevate as directed and Tylenol as needed -Will plan for blister check and possible suture removal at next office visit with Dr. Paulla Dolly. In the meantime, patient to call office if any issues or problems arise.   Landis Martins, DPM

## 2019-08-06 NOTE — Progress Notes (Signed)
[name], [name] ([id number]) Visit Report for 08/04/2019 Arrival Information Details Patient Name: Date of Service: [name], [name] 08/04/2019 1:30 PM Medical Record [id number] Patient Account Number: [id number] Date of Birth/Sex: Treating RN: Apr 22, 1933 (85 y.o. [name] Primary Care Landen Knoedler: Simona Huh Other Clinician: Referring Cayleigh Paull: Treating Amena Dockham/Extender:Robson, Vivi Barrack, Melvern Sample in Treatment: 2 Visit Information History Since Last Visit Added or deleted any medications: No Patient Arrived: Ambulatory Any new allergies or adverse reactions: No Arrival Time: 14:29 Had a fall or experienced change in No Accompanied By: self activities of daily living that may affect Transfer Assistance: None risk of falls: Patient Requires Transmission-Based No Signs or symptoms of abuse/neglect since last No Precautions: visito Patient Has Alerts: No Hospitalized since last visit: No Implantable device outside of the clinic excluding No cellular tissue based products placed in the center since last visit: Has Dressing in Place as Prescribed: Yes Pain Present Now: No Electronic Signature(s) Signed: 08/05/2019 6:16:57 PM By: [name] Entered By: [name] on 08/04/2019 14:29:26 -------------------------------------------------------------------------------- Clinic Level of Care Assessment Details Patient Name: Date of Service: [name], [name] 08/04/2019 1:30 PM Medical Record [id number] Patient Account Number: [id number] Date of Birth/Sex: Treating RN: [birth year] (85 y.o. [name] Primary Care Daulton Harbaugh: Simona Huh Other Clinician: Referring Shahidah Nesbitt: Treating Ruperto Kiernan/Extender:Robson, Vivi Barrack, Melvern Sample in Treatment: 2 Clinic Level of Care Assessment Items TOOL 4 Quantity Score X - Use when only an EandM is performed on FOLLOW-UP visit 1 0 ASSESSMENTS - Nursing Assessment /  Reassessment X - Reassessment of Co-morbidities (includes updates in patient status) 1 10 X - Reassessment of Adherence to Treatment Plan 1 5 ASSESSMENTS - Wound and Skin Assessment / Reassessment X - Simple Wound Assessment / Reassessment - one wound 1 5 []  - Complex Wound Assessment / Reassessment - multiple wounds 0 []  - Dermatologic / Skin Assessment (not related to wound area) 0 ASSESSMENTS - Focused Assessment []  - Circumferential Edema Measurements - multi extremities 0 []  - Nutritional Assessment / Counseling / Intervention 0 X - Lower Extremity Assessment (monofilament, tuning fork, pulses) 1 5 []  - Peripheral Arterial Disease Assessment (using hand held doppler) 0 ASSESSMENTS - Ostomy and/or Continence Assessment and Care []  - Incontinence Assessment and Management 0 []  - Ostomy Care Assessment and Management (repouching, etc.) 0 PROCESS - Coordination of Care X - Simple Patient / Family Education for ongoing care 1 15 []  - Complex (extensive) Patient / Family Education for ongoing care 0 X - Staff obtains Programmer, systems, Records, Test Results / Process Orders 1 10 []  - Staff telephones HHA, Nursing Homes / Clarify orders / etc 0 []  - Routine Transfer to another Facility (non-emergent condition) 0 []  - Routine Hospital Admission (non-emergent condition) 0 []  - New Admissions / Biomedical engineer / Ordering NPWT, Apligraf, etc. 0 []  - Emergency Hospital Admission (emergent condition) 0 X - Simple Discharge Coordination 1 10 []  - Complex (extensive) Discharge Coordination 0 PROCESS - Special Needs []  - Pediatric / Minor Patient Management 0 []  - Isolation Patient Management 0 []  - Hearing / Language / Visual special needs 0 []  - Assessment of Community assistance (transportation, D/C planning, etc.) 0 []  - Additional assistance / Altered mentation 0 []  - Support Surface(s) Assessment (bed, cushion, seat, etc.) 0 INTERVENTIONS - Wound Cleansing / Measurement X - Simple Wound  Cleansing - one wound 1 5 []  - Complex Wound Cleansing - multiple wounds 0 X - Wound Imaging (photographs - any number of wounds)  1 5 []  - Wound Tracing (instead of photographs) 0 X - Simple Wound Measurement - one wound 1 5 []  - Complex Wound Measurement - multiple wounds 0 INTERVENTIONS - Wound Dressings X - Small Wound Dressing one or multiple wounds 1 10 []  - Medium Wound Dressing one or multiple wounds 0 []  - Large Wound Dressing one or multiple wounds 0 X - Application of Medications - topical 1 5 []  - Application of Medications - injection 0 INTERVENTIONS - Miscellaneous []  - External ear exam 0 []  - Specimen Collection (cultures, biopsies, blood, body fluids, etc.) 0 []  - Specimen(s) / Culture(s) sent or taken to Lab for analysis 0 []  - Patient Transfer (multiple staff / Harrel Lemon Lift / Similar devices) 0 []  - Simple Staple / Suture removal (25 or less) 0 []  - Complex Staple / Suture removal (26 or more) 0 []  - Hypo / Hyperglycemic Management (close monitor of Blood Glucose) 0 []  - Ankle / Brachial Index (ABI) - do not check if billed separately 0 X - Vital Signs 1 5 Has the patient been seen at the hospital within the last three years: Yes Total Score: 95 Level Of Care: New/Established - Level 3 Electronic Signature(s) Signed: 08/06/2019 6:50:57 PM By: Levan Hurst RN, BSN Entered By: Levan Hurst on 08/04/2019 18:48:37 -------------------------------------------------------------------------------- Encounter Discharge Information Details Patient Name: Date of Service: [name] Carpenter. 08/04/2019 1:30 PM Medical Record [id number] Patient Account Number: [id number] Date of Birth/Sex: Treating RN: Dec 30, 1932 (85 y.o. [name] Primary Care Jermel Artley: Simona Huh Other Clinician: Referring Mayank Teuscher: Treating Quantarius Genrich/Extender:Robson, Vivi Barrack, Melvern Sample in Treatment: 2 Encounter Discharge Information Items Discharge Condition:  Stable Ambulatory Status: Ambulatory Discharge Destination: Home Transportation: Private Auto Accompanied By: self Schedule Follow-up Appointment: Yes Clinical Summary of Care: Patient Declined Electronic Signature(s) Signed: 08/05/2019 6:16:57 PM By: [name] Entered By: [name] on 08/04/2019 15:02:34 -------------------------------------------------------------------------------- Lower Extremity Assessment Details Patient Name: Date of Service: [name], [name] 08/04/2019 1:30 PM Medical Record [id number] Patient Account Number: [id number] Date of Birth/Sex: Treating RN: 07-15-1933 (85 y.o. [name] Primary Care Edan Juday: Simona Huh Other Clinician: Referring Abelardo Seidner: Treating Zyier Dykema/Extender:Robson, Vivi Barrack, Melvern Sample in Treatment: 2 Edema Assessment Assessed: [Left: No] [Right: No] Edema: [Left: N] [Right: o] Calf Left: Right: Point of Measurement: cm From Medial Instep 30 cm cm Ankle Left: Right: Point of Measurement: cm From Medial Instep 18 cm cm Vascular Assessment Pulses: Dorsalis Pedis Palpable: [Left:Yes] Electronic Signature(s) Signed: 08/05/2019 6:16:57 PM By: [name] Entered By: [name] on 08/04/2019 14:33:27 -------------------------------------------------------------------------------- Multi-Disciplinary Care Plan Details Patient Name: Date of Service: [name] Carpenter. 08/04/2019 1:30 PM Medical Record [id number] Patient Account Number: [id number] Date of Birth/Sex: Treating RN: [birth year] (84 y.o. [name] Primary Care Jonathan Kirkendoll: Simona Huh Other Clinician: Referring Dekendrick Uzelac: Treating Lashika Erker/Extender:Robson, Vivi Barrack, Melvern Sample in Treatment: 2 Active Inactive Wound/Skin Impairment Nursing Diagnoses: Knowledge deficit related to ulceration/compromised skin integrity Goals: Patient/caregiver will verbalize understanding of skin care  regimen Date Initiated: 07/15/2019 Target Resolution Date: 08/22/2019 Goal Status: Active Ulcer/skin breakdown will have a volume reduction of 30% by week 4 Date Initiated: 07/15/2019 Target Resolution Date: 08/22/2019 Goal Status: Active Interventions: Assess patient/caregiver ability to obtain necessary supplies Assess patient/caregiver ability to perform ulcer/skin care regimen upon admission and as needed Assess ulceration(s) every visit Notes: Electronic Signature(s) Signed: 08/06/2019 6:50:57 PM By: Levan Hurst RN, BSN Entered By: Levan Hurst on 08/04/2019 18:48:03 -------------------------------------------------------------------------------- Pain Assessment Details Patient Name: Date of  Service: [name], [name] 08/04/2019 1:30 PM Medical Record [id number] Patient Account Number: [id number] Date of Birth/Sex: Treating RN: 02/15/1933 (85 y.o. [name] Primary Care Tiquan Bouch: Simona Huh Other Clinician: Referring Kailea Dannemiller: Treating Myca Perno/Extender:Robson, Vivi Barrack, Melvern Sample in Treatment: 2 Active Problems Location of Pain Severity and Description of Pain Patient Has Paino No Site Locations Pain Management and Medication Current Pain Management: Electronic Signature(s) Signed: 08/05/2019 6:16:57 PM By: [name] Entered By: [name] on 08/04/2019 14:32:37 -------------------------------------------------------------------------------- Patient/Caregiver Education Details Patient Name: Date of Service: [name] Carpenter 10/19/2020andnbsp1:30 PM Medical Record Patient Account Number: [id number] Number: Treating RN: Levan Hurst Date of Birth/Gender: 1933-01-11 (85 y.o. F) Other Clinician: Primary Care Physician: Dietrich Pates Referring Physician: Physician/Extender: Nena Alexander in Treatment: 2 Education Assessment Education Provided To: Patient Education  Topics Provided Wound/Skin Impairment: Methods: Explain/Verbal Responses: State content correctly Electronic Signature(s) Signed: 08/06/2019 6:50:57 PM By: Levan Hurst RN, BSN Entered By: Levan Hurst on 08/04/2019 18:48:13 -------------------------------------------------------------------------------- Wound Assessment Details Patient Name: Date of Service: [name], [name] 08/04/2019 1:30 PM Medical Record [id number] Patient Account Number: [id number] Date of Birth/Sex: Treating RN: Jun 19, 1933 (85 y.o. [name] Primary Care Leshaun Biebel: Simona Huh Other Clinician: Referring Briggitte Boline: Treating Gordon Carlson/Extender:Robson, Vivi Barrack, Melvern Sample in Treatment: 2 Wound Status Wound Number: 4 Primary Venous Leg Ulcer Etiology: Wound Location: Left Lower Leg - Lateral Wound Open Wounding Event: Trauma Status: Date Acquired: 07/01/2019 Comorbid Cataracts, Hypertension, Raynauds, Weeks Of Treatment: 2 History: Osteoarthritis Clustered Wound: Yes Photos Wound Measurements Length: (cm) 2.7 % Reduct Width: (cm) 0.5 % Reduct Depth: (cm) 0.1 Epitheli Clustered Quantity: 4 Tunnelin Area: (cm) 1.06 Undermi Volume: (cm) 0.106 Wound Description Classification: Full Thickness Without Exposed Support Foul Od Structures Slough/ Wound Flat and Intact Margin: Exudate Small Amount: Exudate Exudate Serosanguineous Type: Exudate red, brown Color: Wound Bed Granulation Amount: Large (67-100%) Granulation Quality: Red Fascia Exp Necrotic Amount: None Present (0%) Fat Layer Tendon Exp Muscle Exp Joint Expo Bone Expos or After Cleansing: No Fibrino No Exposed Structure osed: No (Subcutaneous Tissue) Exposed: Yes osed: No osed: No sed: No ed: No ion in Area: 80.7% ion in Volume: 80.7% alization: Large (67-100%) g: No ning: No Treatment Notes Wound #4 (Left, Lateral Lower Leg) 1. Cleanse With Wound Cleanser 2. Periwound  Care Moisturizing lotion 3. Primary Dressing Applied Hydrofera Blue 4. Secondary Dressing Dry Gauze Roll Gauze 5. Secured With Recruitment consultant) Signed: 08/05/2019 4:20:24 PM By: Mikeal Hawthorne EMT/HBOT Signed: 08/05/2019 6:16:57 PM By: [name] Entered By: Mikeal Hawthorne on 08/05/2019 14:44:42 -------------------------------------------------------------------------------- Vitals Details Patient Name: Date of Service: [name], [name] 08/04/2019 1:30 PM Medical Record [id number] Patient Account Number: [id number] Date of Birth/Sex: Treating RN: 05-02-1933 (85 y.o. [name] Primary Care Dina Mobley: Simona Huh Other Clinician: Referring Doloras Tellado: Treating Grier Czerwinski/Extender:Robson, Vivi Barrack, Melvern Sample in Treatment: 2 Vital Signs Time Taken: 14:20 Temperature (F): 98.5 Height (in): 62 Pulse (bpm): 51 Weight (lbs): 105 Respiratory Rate (breaths/min): 17 Body Mass Index (BMI): 19.2 Blood Pressure (mmHg): 139/49 Reference Range: 80 - 120 mg / dl Electronic Signature(s) Signed: 08/05/2019 6:16:57 PM By: [name] Entered By: [name] on 08/04/2019 14:31:00

## 2019-08-06 NOTE — Progress Notes (Signed)
[name], [name] ([id number]) Visit Report for 08/04/2019 HPI Details Patient Name: Date of Service: [name], [name] 08/04/2019 1:30 PM Medical Record [id number] Patient Account Number: [id number] Date of Birth/Sex: Treating RN: February 08, 1933 (85 y.o. [name] Primary Care Provider: Simona [name] Other Clinician: Referring Provider: Treating Provider/Extender:[name], [name], [name] in Treatment: 2 History of Present Illness Location: left lower extremity on the anterior shin Quality: Patient reports experiencing a dull pain to affected area(s). Severity: Patient states wound(s) are getting worse. Duration: Patient has had the wound for > 2 months prior to seeking treatment at the wound center Timing: Pain in wound is Intermittent (comes and goes Context: The wound occurred when the patient had a blunt injury against the tub Modifying Factors: Patient wound(s)/ulcer(s) are worsening due to :initially it was getting better and then started breaking open and draining Associated Signs and Symptoms: Patient reports having increase discharge. HPI Description: Thispleasant 85 year old who looks much younger than her stated age had a blunt injury to her left leg and has had an open wound which was taken care of at the urgent care. Was better initially but then started getting worse when she started applying Vaseline over this. no significant past medical history. ====== Old notes this patient suffered a traumatic laceration on her left leg 16 days ago against her bathtub. She was seen in urgent care and it was felt that her skin was too fragile to sutures so it was Steri-Stripped. Some 10 days later the Steri- Strips were removed. The wounds wound became what sounds to be infected with swelling and pain. She was put on anti-biotics. She is here for our review of this laceration.she is not a diabetic she does not have prior wound history. She is active walking up  to 5 miles per day without difficulty. 04/16/15; the wound continues to be stable. She has undermining on its medial aspects. Her is no evidence of infection or ischemia. She does have stasis physiology. 04/21/2015 -- comes in earlier because of increasing pain over the left lower extremity ulceration and increasing discharge possibly purulent. The nurse was concerned enough to make it a doctor visit. No fever. 04/30/15 she comes in with a new traumatic wound [patio wall] area on her upper left leg. The original wound appears to be healthier. 05/07/15; the patient fell on the new traumatic area on her upper left leg. The original wound continues to be healthier. 06/04/15; both of the wound areas appear much better than the last time I saw these. Only a small open area remains of her original wound and the subsequent wound on the upper leg. There is no evidence of infection. both of these wounds are traumatic, 06/18/15 considerable improvement since I last saw these areas. No evidence of infection 06/25/15; the superior traumatic wound has resolved. The original inferior wound has only a small open area remaining, certainly no better on the silver alginate versus last week 07/09/15; all the wound areas have resolved. She has underlining venous stasis although both of these wound areas were trauma. No additional measures unless she returns ======== READMISSION 07/15/2019 Patient is now an 85 year old woman who was previously here in 2016 and 2018. Cared for at the time by Dr. Con [name]. Both times with wounds related to trauma in the left leg. She was felt to have underlying venous insufficiency although both occasions were related to trauma. The patient tells me that 2 weeks ago she hit her lateral left leg on the car  door. This was a skin tear with a flap for a period of time she has been using Polysporin. The flap came off recently she has a clean looking superficial wound on the left lateral leg. Our  intake nurse reported some drainage. Past medical history; includes sensorineural hearing loss, osteoarthritis, hypertension and a hammertoe on the right foot for which she follows with podiatry. ABIs in our clinic were 1.19 on the left 07/21/2019; the patient did not like the wraps. They slid down and rub the wound the wound is measuring larger she is upset. 10/12; left lateral leg wound. Most of this looks healthy even under illumination. We have been using Hydrofera Blue under border foam. She would not allow compression 10/19; left lateral leg wound. 2 small open areas remain of the original wound. We have been using Hydrofera Blue under border foam. This seems to be making decent progress Electronic Signature(s) Signed: 08/04/2019 5:54:03 PM By: [name] Ham MD Entered By: [name] on 08/04/2019 17:16:15 -------------------------------------------------------------------------------- Physician Orders Details Patient Name: Date of Service: [name], [name] 08/04/2019 1:30 PM Medical Record [id number] Patient Account Number: [id number] Date of Birth/Sex: Treating RN: Mar 27, 1933 (85 y.o. [name] Primary Care Provider: Simona [name] Other Clinician: Referring Provider: Treating Provider/Extender:[name], [name], [name] in Treatment: 2 Verbal / Phone Orders: No Diagnosis Coding ICD-10 Coding Code Description [phone] Laceration without foreign body, left lower leg, subsequent encounter I87.301 Chronic venous hypertension (idiopathic) without complications of right lower extremity Follow-up Appointments Return Appointment in 1 week. Dressing Change Frequency Wound #4 Left,Lateral Lower Leg Change Dressing every other day. Wound Cleansing Wound #4 Left,Lateral Lower Leg May shower and wash wound with soap and water. Primary Wound Dressing Wound #4 Left,Lateral Lower Leg Hydrofera Blue Secondary Dressing Wound #4 Left,Lateral Lower  Leg Kerlix/Rolled Gauze - secure with tape Dry Gauze Edema Control Elevate legs to the level of the heart or above for 30 minutes daily and/or when sitting, a frequency of: - throughout the day Electronic Signature(s) Signed: 08/04/2019 5:54:03 PM By: [name] Ham MD Signed: 08/06/2019 6:50:57 PM By: Levan Hurst RN, BSN Entered By: Levan Hurst on 08/04/2019 14:46:52 -------------------------------------------------------------------------------- Problem List Details Patient Name: Date of Service: [name], [name] 08/04/2019 1:30 PM Medical Record [id number] Patient Account Number: [id number] Date of Birth/Sex: Treating RN: October 01, 1933 (85 y.o. [name] Primary Care Provider: Simona [name] Other Clinician: Referring Provider: Treating Provider/Extender:Ambika Zettlemoyer, [name], [name] in Treatment: 2 Active Problems ICD-10 Evaluated Encounter Code Description Active Date Today Diagnosis S81.812D Laceration without foreign body, left lower leg, 07/15/2019 No Yes subsequent encounter I87.301 Chronic venous hypertension (idiopathic) without [id number] No Yes complications of right lower extremity Inactive Problems Resolved Problems Electronic Signature(s) Signed: 08/04/2019 5:54:03 PM By: [name] Ham MD Entered By: [name] on 08/04/2019 17:13:25 -------------------------------------------------------------------------------- Progress Note Details Patient Name: Date of Service: [name] 08/04/2019 1:30 PM Medical Record [id number] Patient Account Number: [id number] Date of Birth/Sex: Treating RN: [birth year] (85 y.o. [name] Primary Care Provider: Simona [name] Other Clinician: Referring Provider: Treating Provider/Extender:Darthy Manganelli, [name], [name] in Treatment: 2 Subjective History of Present Illness (HPI) The following HPI elements were documented for the patient's wound: Location: left  lower extremity on the anterior shin Quality: Patient reports experiencing a dull pain to affected area(s). Severity: Patient states wound(s) are getting worse. Duration: Patient has had the wound for > 2 months prior to seeking treatment at the wound center Timing: Pain in wound is  Intermittent (comes and goes Context: The wound occurred when the patient had a blunt injury against the tub Modifying Factors: Patient wound(s)/ulcer(s) are worsening due to :initially it was getting better and then started breaking open and draining Associated Signs and Symptoms: Patient reports having increase discharge. Thispleasant 85 year old who looks much younger than her stated age had a blunt injury to her left leg and has had an open wound which was taken care of at the urgent care. Was better initially but then started getting worse when she started applying Vaseline over this. no significant past medical history. ====== Old notes this patient suffered a traumatic laceration on her left leg 16 days ago against her bathtub. She was seen in urgent care and it was felt that her skin was too fragile to sutures so it was Steri-Stripped. Some 10 days later the Steri- Strips were removed. The wounds wound became what sounds to be infected with swelling and pain. She was put on anti-biotics. She is here for our review of this laceration.she is not a diabetic she does not have prior wound history. She is active walking up to 5 miles per day without difficulty. 04/16/15; the wound continues to be stable. She has undermining on its medial aspects. Her is no evidence of infection or ischemia. She does have stasis physiology. 04/21/2015 -- comes in earlier because of increasing pain over the left lower extremity ulceration and increasing discharge possibly purulent. The nurse was concerned enough to make it a doctor visit. No fever. 04/30/15 she comes in with a new traumatic wound [patio wall] area on her upper left  leg. The original wound appears to be healthier. 05/07/15; the patient fell on the new traumatic area on her upper left leg. The original wound continues to be healthier. 06/04/15; both of the wound areas appear much better than the last time I saw these. Only a small open area remains of her original wound and the subsequent wound on the upper leg. There is no evidence of infection. both of these wounds are traumatic, 06/18/15 considerable improvement since I last saw these areas. No evidence of infection 06/25/15; the superior traumatic wound has resolved. The original inferior wound has only a small open area remaining, certainly no better on the silver alginate versus last week 07/09/15; all the wound areas have resolved. She has underlining venous stasis although both of these wound areas were trauma. No additional measures unless she returns ======== READMISSION 07/15/2019 Patient is now an 85 year old woman who was previously here in 2016 and 2018. Cared for at the time by Dr. Con [name]. Both times with wounds related to trauma in the left leg. She was felt to have underlying venous insufficiency although both occasions were related to trauma. The patient tells me that 2 weeks ago she hit her lateral left leg on the car door. This was a skin tear with a flap for a period of time she has been using Polysporin. The flap came off recently she has a clean looking superficial wound on the left lateral leg. Our intake nurse reported some drainage. Past medical history; includes sensorineural hearing loss, osteoarthritis, hypertension and a hammertoe on the right foot for which she follows with podiatry. ABIs in our clinic were 1.19 on the left 07/21/2019; the patient did not like the wraps. They slid down and rub the wound the wound is measuring larger she is upset. 10/12; left lateral leg wound. Most of this looks healthy even under illumination. We have been using  Hydrofera Blue under border foam.  She would not allow compression 10/19; left lateral leg wound. 2 small open areas remain of the original wound. We have been using Hydrofera Blue under border foam. This seems to be making decent progress Objective Constitutional Vitals Time Taken: 2:20 PM, Height: 62 in, Weight: 105 lbs, BMI: 19.2, Temperature: 98.5 F, Pulse: 51 bpm, Respiratory Rate: 17 breaths/min, Blood Pressure: 139/49 mmHg. Integumentary (Hair, Skin) Wound #4 status is Open. Original cause of wound was Trauma. The wound is located on the Left,Lateral Lower Leg. The wound measures 2.7cm length x 0.5cm width x 0.1cm depth; 1.06cm^2 area and 0.106cm^3 volume. There is Fat Layer (Subcutaneous Tissue) Exposed exposed. There is no tunneling or undermining noted. There is a small amount of serosanguineous drainage noted. The wound margin is flat and intact. There is large (67-100%) red granulation within the wound bed. There is no necrotic tissue within the wound bed. Assessment Active Problems ICD-10 Laceration without foreign body, left lower leg, subsequent encounter Chronic venous hypertension (idiopathic) without complications of right lower extremity Plan Follow-up Appointments: Return Appointment in 1 week. Dressing Change Frequency: Wound #4 Left,Lateral Lower Leg: Change Dressing every other day. Wound Cleansing: Wound #4 Left,Lateral Lower Leg: May shower and wash wound with soap and water. Primary Wound Dressing: Wound #4 Left,Lateral Lower Leg: Hydrofera Blue Secondary Dressing: Wound #4 Left,Lateral Lower Leg: Kerlix/Rolled Gauze - secure with tape Dry Gauze Edema Control: Elevate legs to the level of the heart or above for 30 minutes daily and/or when sitting, a frequency of: - throughout the day 1. We continued with the [facility] and bordered foam 2. She would not allow compression glove in spite of this the wound appears to be doing fairly well Electronic Signature(s) Signed: 08/04/2019  5:54:03 PM By: [name] Ham MD Entered By: [name] on 08/04/2019 17:17:01 -------------------------------------------------------------------------------- SuperBill Details Patient Name: Date of Service: [name], [name] 08/04/2019 Medical Record [id number] Patient Account Number: [id number] Date of Birth/Sex: Treating RN: 23-May-1933 (85 y.o. [name] Primary Care Provider: Simona [name] Other Clinician: Referring Provider: Treating Provider/Extender:Rubina Basinski, [name], [name] in Treatment: 2 Diagnosis Coding ICD-10 Codes Code Description [phone] Laceration without foreign body, left lower leg, subsequent encounter I87.301 Chronic venous hypertension (idiopathic) without complications of right lower extremity Facility Procedures CPT4 Code: [id number] Description: [id number] - WOUND CARE VISIT-LEV 3 EST PT Modifier: Quantity: 1 Physician Procedures CPT4 Code Description: [id number] - WC PHYS LEVEL 2 - EST PT ICD-10 Diagnosis Description S81.812D Laceration without foreign body, left lower leg, subsequent enc I87.301 Chronic venous hypertension (idiopathic) without complications extremity Modifier: 1 ounter of right lower Quantity: Electronic Signature(s) Signed: 08/05/2019 6:17:56 PM By: [name] Ham MD Signed: 08/06/2019 6:50:57 PM By: Levan Hurst RN, BSN Previous Signature: 08/04/2019 5:54:03 PM Version By: [name] Ham MD Entered By: Levan Hurst on 08/04/2019 18:48:48

## 2019-08-08 ENCOUNTER — Other Ambulatory Visit: Payer: Self-pay | Admitting: Certified Nurse Midwife

## 2019-08-08 DIAGNOSIS — N952 Postmenopausal atrophic vaginitis: Secondary | ICD-10-CM

## 2019-08-08 NOTE — Telephone Encounter (Signed)
Medication refill request: Premarin  Last AEX:  08/08/18 Next AEX: 08/20/19 Last MMG (if hormonal medication request): 09/05/18 bi-rads 1 neg  Refill authorized: 30g with 0 rf

## 2019-08-09 LAB — WOUND CULTURE
MICRO NUMBER:: 1014580
SPECIMEN QUALITY:: ADEQUATE

## 2019-08-11 ENCOUNTER — Telehealth: Payer: Self-pay | Admitting: *Deleted

## 2019-08-11 ENCOUNTER — Encounter (HOSPITAL_BASED_OUTPATIENT_CLINIC_OR_DEPARTMENT_OTHER): Payer: PPO | Admitting: Internal Medicine

## 2019-08-11 ENCOUNTER — Other Ambulatory Visit: Payer: Self-pay

## 2019-08-11 DIAGNOSIS — S81812A Laceration without foreign body, left lower leg, initial encounter: Secondary | ICD-10-CM | POA: Diagnosis not present

## 2019-08-11 DIAGNOSIS — I872 Venous insufficiency (chronic) (peripheral): Secondary | ICD-10-CM | POA: Diagnosis not present

## 2019-08-11 NOTE — Progress Notes (Signed)
[name], [name] ([id number]) Visit Report for 08/11/2019 HPI Details Patient Name: Date of Service: [name], [name] 08/11/2019 1:45 PM Medical Record [id number] Patient Account Number: [id number] Date of Birth/Sex: Treating RN: 1933/05/05 (85 y.o. [name] Primary Care Provider: Simona [name] Other Clinician: Referring Provider: Treating Provider/Extender:Mekenna Finau, Vivi Barrack, Melvern Sample in Treatment: 3 History of Present Illness Location: left lower extremity on the anterior shin Quality: Patient reports experiencing a dull pain to affected area(s). Severity: Patient states wound(s) are getting worse. Duration: Patient has had the wound for > 2 months prior to seeking treatment at the wound center Timing: Pain in wound is Intermittent (comes and goes Context: The wound occurred when the patient had a blunt injury against the tub Modifying Factors: Patient wound(s)/ulcer(s) are worsening due to :initially it was getting better and then started breaking open and draining Associated Signs and Symptoms: Patient reports having increase discharge. HPI Description: Thispleasant 85 year old who looks much younger than her stated age had a blunt injury to her left leg and has had an open wound which was taken care of at the urgent care. Was better initially but then started getting worse when she started applying Vaseline over this. no significant past medical history. ====== Old notes this patient suffered a traumatic laceration on her left leg 16 days ago against her bathtub. She was seen in urgent care and it was felt that her skin was too fragile to sutures so it was Steri-Stripped. Some 10 days later the Steri- Strips were removed. The wounds wound became what sounds to be infected with swelling and pain. She was put on anti-biotics. She is here for our review of this laceration.she is not a diabetic she does not have prior wound history. She is active walking up  to 5 miles per day without difficulty. 04/16/15; the wound continues to be stable. She has undermining on its medial aspects. Her is no evidence of infection or ischemia. She does have stasis physiology. 04/21/2015 -- comes in earlier because of increasing pain over the left lower extremity ulceration and increasing discharge possibly purulent. The nurse was concerned enough to make it a doctor visit. No fever. 04/30/15 she comes in with a new traumatic wound [patio wall] area on her upper left leg. The original wound appears to be healthier. 05/07/15; the patient fell on the new traumatic area on her upper left leg. The original wound continues to be healthier. 06/04/15; both of the wound areas appear much better than the last time I saw these. Only a small open area remains of her original wound and the subsequent wound on the upper leg. There is no evidence of infection. both of these wounds are traumatic, 06/18/15 considerable improvement since I last saw these areas. No evidence of infection 06/25/15; the superior traumatic wound has resolved. The original inferior wound has only a small open area remaining, certainly no better on the silver alginate versus last week 07/09/15; all the wound areas have resolved. She has underlining venous stasis although both of these wound areas were trauma. No additional measures unless she returns ======== READMISSION 07/15/2019 Patient is now an 85 year old woman who was previously here in 2016 and 2018. Cared for at the time by Dr. Con Memos. Both times with wounds related to trauma in the left leg. She was felt to have underlying venous insufficiency although both occasions were related to trauma. The patient tells me that 2 weeks ago she hit her lateral left leg on the car  door. This was a skin tear with a flap for a period of time she has been using Polysporin. The flap came off recently she has a clean looking superficial wound on the left lateral leg. Our  intake nurse reported some drainage. Past medical history; includes sensorineural hearing loss, osteoarthritis, hypertension and a hammertoe on the right foot for which she follows with podiatry. ABIs in our clinic were 1.19 on the left 07/21/2019; the patient did not like the wraps. They slid down and rub the wound the wound is measuring larger she is upset. 10/12; left lateral leg wound. Most of this looks healthy even under illumination. We have been using Hydrofera Blue under border foam. She would not allow compression 10/19; left lateral leg wound. 2 small open areas remain of the original wound. We have been using Hydrofera Blue under border foam. This seems to be making decent progress 10/26 left lateral leg traumatic wound. There is no open wound remaining. We have been using Hydrofera Blue under border foam she arrived with some denuded epithelium that I removed there is no open wound remaining. Is fairly clear she has some degree of chronic venous insufficiency with not a lot of edema but dilated veins in her feet. She reminds me that she also has reactive arthritis and was on prednisone for a prolonged period of time. Nevertheless I think it would be beneficial for her to at least wear support stockings but right now I do not think she is going to agree to the Electronic Signature(s) Signed: 08/11/2019 5:48:14 PM By: Linton Ham MD Entered By: Linton Ham on 08/11/2019 15:32:44 -------------------------------------------------------------------------------- Physical Exam Details Patient Name: Date of Service: [name], [name] 08/11/2019 1:45 PM Medical Record [id number] Patient Account Number: [id number] Date of Birth/Sex: Treating RN: 1933/01/06 (85 y.o. [name] Primary Care Provider: Simona [name] Other Clinician: Referring Provider: Treating Provider/Extender:Gradie Ohm, Vivi Barrack, Melvern Sample in Treatment: 3 Constitutional Sitting or  standing Blood Pressure is within target range for patient.. Pulse regular and within target range for patient.Marland Kitchen Respirations regular, non-labored and within target range.. Temperature is normal and within the target range for the patient.Marland Kitchen Appears in no distress. Cardiovascular Pedal pulses palpable. Notes Wound exam; patient arrives in clinic completely healed. She does not have an inordinate amount of lower extremity edema. She does have venous hypertension however looking at dilated veins in her feet and ankles. There is no primary skin disturbance seen. Electronic Signature(s) Signed: 08/11/2019 5:48:14 PM By: Linton Ham MD Entered By: Linton Ham on 08/11/2019 15:34:02 -------------------------------------------------------------------------------- Physician Orders Details Patient Name: Date of Service: TIMYA, QUARTARARO 08/11/2019 1:45 PM Medical Record [id number] Patient Account Number: [id number] Date of Birth/Sex: Treating RN: 10-Oct-1933 (85 y.o. [name] Primary Care Provider: Simona [name] Other Clinician: Referring Provider: Treating Provider/Extender:Alizah Sills, Vivi Barrack, Melvern Sample in Treatment: 3 Verbal / Phone Orders: No Diagnosis Coding ICD-10 Coding Code Description [phone] Laceration without foreign body, left lower leg, subsequent encounter I87.301 Chronic venous hypertension (idiopathic) without complications of right lower extremity Discharge From [facility] Discharge from [place] lotion - both legs daily Edema Control Patient to wear own compression stockings Avoid standing for long periods of time Elevate legs to the level of the heart or above for 30 minutes daily and/or when sitting, a frequency of: - throughout the day Electronic Signature(s) Signed: 08/11/2019 5:48:14 PM By: Linton Ham MD Signed: 08/11/2019 6:04:10 PM By: Levan Hurst RN, BSN Entered  By: Levan Hurst on 08/11/2019 15:18:00 -------------------------------------------------------------------------------- Problem List Details Patient Name: Date of Service: CIANI, QUARLES 08/11/2019 1:45 PM Medical Record [id number] Patient Account Number: [id number] Date of Birth/Sex: Treating RN: 1933-08-12 (85 y.o. [name] Primary Care Provider: Simona [name] Other Clinician: Referring Provider: Treating Provider/Extender:Kratos Ruscitti, Vivi Barrack, Melvern Sample in Treatment: 3 Active Problems ICD-10 Evaluated Encounter Code Description Active Date Today Diagnosis S81.812D Laceration without foreign body, left lower leg, 07/15/2019 No Yes subsequent encounter I87.301 Chronic venous hypertension (idiopathic) without [id number] No Yes complications of right lower extremity Inactive Problems Resolved Problems Electronic Signature(s) Signed: 08/11/2019 5:48:14 PM By: Linton Ham MD Entered By: Linton Ham on 08/11/2019 15:31:00 -------------------------------------------------------------------------------- Progress Note Details Patient Name: Date of Service: [name] 08/11/2019 1:45 PM Medical Record [id number] Patient Account Number: [id number] Date of Birth/Sex: Treating RN: 03/03/1933 (85 y.o. [name] Primary Care Provider: Simona [name] Other Clinician: Referring Provider: Treating Provider/Extender:Tommie Dejoseph, Vivi Barrack, Melvern Sample in Treatment: 3 Subjective History of Present Illness (HPI) The following HPI elements were documented for the patient's wound: Location: left lower extremity on the anterior shin Quality: Patient reports experiencing a dull pain to affected area(s). Severity: Patient states wound(s) are getting worse. Duration: Patient has had the wound for > 2 months prior to seeking treatment at the wound center Timing: Pain in wound is Intermittent (comes and goes Context: The wound occurred  when the patient had a blunt injury against the tub Modifying Factors: Patient wound(s)/ulcer(s) are worsening due to :initially it was getting better and then started breaking open and draining Associated Signs and Symptoms: Patient reports having increase discharge. Thispleasant 85 year old who looks much younger than her stated age had a blunt injury to her left leg and has had an open wound which was taken care of at the urgent care. Was better initially but then started getting worse when she started applying Vaseline over this. no significant past medical history. ====== Old notes this patient suffered a traumatic laceration on her left leg 16 days ago against her bathtub. She was seen in urgent care and it was felt that her skin was too fragile to sutures so it was Steri-Stripped. Some 10 days later the Steri- Strips were removed. The wounds wound became what sounds to be infected with swelling and pain. She was put on anti-biotics. She is here for our review of this laceration.she is not a diabetic she does not have prior wound history. She is active walking up to 5 miles per day without difficulty. 04/16/15; the wound continues to be stable. She has undermining on its medial aspects. Her is no evidence of infection or ischemia. She does have stasis physiology. 04/21/2015 -- comes in earlier because of increasing pain over the left lower extremity ulceration and increasing discharge possibly purulent. The nurse was concerned enough to make it a doctor visit. No fever. 04/30/15 she comes in with a new traumatic wound [patio wall] area on her upper left leg. The original wound appears to be healthier. 05/07/15; the patient fell on the new traumatic area on her upper left leg. The original wound continues to be healthier. 06/04/15; both of the wound areas appear much better than the last time I saw these. Only a small open area remains of her original wound and the subsequent wound on the  upper leg. There is no evidence of infection. both of these wounds are traumatic, 06/18/15 considerable improvement since I last saw these areas. No evidence of  infection 06/25/15; the superior traumatic wound has resolved. The original inferior wound has only a small open area remaining, certainly no better on the silver alginate versus last week 07/09/15; all the wound areas have resolved. She has underlining venous stasis although both of these wound areas were trauma. No additional measures unless she returns ======== READMISSION 07/15/2019 Patient is now an 85 year old woman who was previously here in 2016 and 2018. Cared for at the time by Dr. Con Memos. Both times with wounds related to trauma in the left leg. She was felt to have underlying venous insufficiency although both occasions were related to trauma. The patient tells me that 2 weeks ago she hit her lateral left leg on the car door. This was a skin tear with a flap for a period of time she has been using Polysporin. The flap came off recently she has a clean looking superficial wound on the left lateral leg. Our intake nurse reported some drainage. Past medical history; includes sensorineural hearing loss, osteoarthritis, hypertension and a hammertoe on the right foot for which she follows with podiatry. ABIs in our clinic were 1.19 on the left 07/21/2019; the patient did not like the wraps. They slid down and rub the wound the wound is measuring larger she is upset. 10/12; left lateral leg wound. Most of this looks healthy even under illumination. We have been using Hydrofera Blue under border foam. She would not allow compression 10/19; left lateral leg wound. 2 small open areas remain of the original wound. We have been using Hydrofera Blue under border foam. This seems to be making decent progress 10/26 left lateral leg traumatic wound. There is no open wound remaining. We have been using Hydrofera Blue under border foam she  arrived with some denuded epithelium that I removed there is no open wound remaining. Is fairly clear she has some degree of chronic venous insufficiency with not a lot of edema but dilated veins in her feet. She reminds me that she also has reactive arthritis and was on prednisone for a prolonged period of time. Nevertheless I think it would be beneficial for her to at least wear support stockings but right now I do not think she is going to agree to the Objective Constitutional Sitting or standing Blood Pressure is within target range for patient.. Pulse regular and within target range for patient.Marland Kitchen Respirations regular, non-labored and within target range.. Temperature is normal and within the target range for the patient.Marland Kitchen Appears in no distress. Vitals Time Taken: 2:45 PM, Height: 62 in, Weight: 105 lbs, BMI: 19.2, Temperature: 98.0 F, Pulse: 50 bpm, Respiratory Rate: 16 breaths/min, Blood Pressure: 136/45 mmHg. Cardiovascular Pedal pulses palpable. General Notes: Wound exam; patient arrives in clinic completely healed. She does not have an inordinate amount of lower extremity edema. She does have venous hypertension however looking at dilated veins in her feet and ankles. There is no primary skin disturbance seen. Integumentary (Hair, Skin) Wound #4 status is Healed - Epithelialized. Original cause of wound was Trauma. The wound is located on the Left,Lateral Lower Leg. The wound measures 0cm length x 0cm width x 0cm depth; 0cm^2 area and 0cm^3 volume. There is no tunneling or undermining noted. There is a none present amount of drainage noted. The wound margin is flat and intact. There is no granulation within the wound bed. There is no necrotic tissue within the wound bed. Assessment Active Problems ICD-10 Laceration without foreign body, left lower leg, subsequent encounter Chronic venous hypertension (idiopathic) without  complications of right lower extremity Plan Discharge  From [facility]: Discharge from [place] Skin Barriers/Peri-Wound Care: Moisturizing lotion - both legs daily Edema Control: Patient to wear own compression stockings Avoid standing for long periods of time Elevate legs to the level of the heart or above for 30 minutes daily and/or when sitting, a frequency of: - throughout the day 1. The patient can be discharged from the wound care center 2. I suggested moisturizing her legs nightly 3. I also suggested support stockings for edema control and skin protection. I am doubtful she will agree to this. Electronic Signature(s) Signed: 08/11/2019 5:48:14 PM By: Linton Ham MD Entered By: Linton Ham on 08/11/2019 15:34:49 -------------------------------------------------------------------------------- SuperBill Details Patient Name: Date of Service: [name], [name] 08/11/2019 Medical Record [id number] Patient Account Number: [id number] Date of Birth/Sex: Treating RN: 04/01/1933 (85 y.o. [name] Primary Care Provider: Simona [name] Other Clinician: Referring Provider: Treating Provider/Extender:Tyreisha Ungar, Vivi Barrack, Melvern Sample in Treatment: 3 Diagnosis Coding ICD-10 Codes Code Description [phone] Laceration without foreign body, left lower leg, subsequent encounter I87.301 Chronic venous hypertension (idiopathic) without complications of right lower extremity Facility Procedures CPT4 Code: [id number] Description: [id number] - WOUND CARE VISIT-LEV 3 EST PT Modifier: Quantity: 1 Physician Procedures CPT4 Code Description: [id number] - WC PHYS LEVEL 2 - EST PT ICD-10 Diagnosis Description S81.812D Laceration without foreign body, left lower leg, subseq I87.301 Chronic venous hypertension (idiopathic) without compli extremity Modifier: uent encounter cations of right Quantity: 1 lower Electronic Signature(s) Signed: 08/11/2019 5:48:14 PM By: Linton Ham MD Entered By: Linton Ham on  08/11/2019 15:35:03

## 2019-08-11 NOTE — Telephone Encounter (Signed)
-----   Message from Landis Martins, [name] sent at 08/09/2019  2:34 PM EDT ----- Have patient to continue with clindamycin for + culture of MRSA. She can stop doxycyline. Thanks Dr. Cannon Kettle

## 2019-08-11 NOTE — Progress Notes (Addendum)
[name], [name] ([id number]) Visit Report for 08/11/2019 Arrival Information Details Patient Name: Date of Service: [name], [name] 08/11/2019 1:45 PM Medical Record [id number] Patient Account Number: [id number] Date of Birth/Sex: Treating RN: [birth year] (85 y.o. Hollie Salk, Larene Beach Primary Care Emir Nack: Simona Huh Other Clinician: Referring Carolynne Schuchard: Treating Joram Venson/Extender:Robson, Vivi Barrack, Melvern Sample in Treatment: 3 Visit Information History Since Last Visit Added or deleted any medications: No Patient Arrived: Ambulatory Any new allergies or adverse reactions: No Arrival Time: 14:44 Had a fall or experienced change in No Accompanied By: self activities of daily living that may affect Transfer Assistance: None risk of falls: Patient Identification Verified: Yes Signs or symptoms of abuse/neglect since last No Secondary Verification Process Yes visito Completed: Hospitalized since last visit: No Patient Requires Transmission-Based No Implantable device outside of the clinic excluding No Precautions: cellular tissue based products placed in the center Patient Has Alerts: No since last visit: Has Dressing in Place as Prescribed: Yes Pain Present Now: No Electronic Signature(s) Signed: 08/11/2019 5:21:50 PM By: [name] Entered By: [name] on 08/11/2019 14:46:36 -------------------------------------------------------------------------------- Clinic Level of Care Assessment Details Patient Name: Date of Service: [name], [name] 08/11/2019 1:45 PM Medical Record [id number] Patient Account Number: [id number] Date of Birth/Sex: Treating RN: 1933-10-07 (85 y.o. [name] Primary Care Bryanna Yim: Simona Huh Other Clinician: Referring Anamika Kueker: Treating Cailen Mihalik/Extender:Robson, Vivi Barrack, Melvern Sample in Treatment: 3 Clinic Level of Care Assessment Items TOOL 4 Quantity Score X - Use when only an  EandM is performed on FOLLOW-UP visit 1 0 ASSESSMENTS - Nursing Assessment / Reassessment X - Reassessment of Co-morbidities (includes updates in patient status) 1 10 X - Reassessment of Adherence to Treatment Plan 1 5 ASSESSMENTS - Wound and Skin Assessment / Reassessment X - Simple Wound Assessment / Reassessment - one wound 1 5 []  - Complex Wound Assessment / Reassessment - multiple wounds 0 []  - Dermatologic / Skin Assessment (not related to wound area) 0 ASSESSMENTS - Focused Assessment []  - Circumferential Edema Measurements - multi extremities 0 []  - Nutritional Assessment / Counseling / Intervention 0 X - Lower Extremity Assessment (monofilament, tuning fork, pulses) 1 5 []  - Peripheral Arterial Disease Assessment (using hand held doppler) 0 ASSESSMENTS - Ostomy and/or Continence Assessment and Care []  - Incontinence Assessment and Management 0 []  - Ostomy Care Assessment and Management (repouching, etc.) 0 PROCESS - Coordination of Care X - Simple Patient / Family Education for ongoing care 1 15 []  - Complex (extensive) Patient / Family Education for ongoing care 0 X - Staff obtains Programmer, systems, Records, Test Results / Process Orders 1 10 []  - Staff telephones HHA, Nursing Homes / Clarify orders / etc 0 []  - Routine Transfer to another Facility (non-emergent condition) 0 []  - Routine Hospital Admission (non-emergent condition) 0 []  - New Admissions / Biomedical engineer / Ordering NPWT, Apligraf, etc. 0 []  - Emergency Hospital Admission (emergent condition) 0 X - Simple Discharge Coordination 1 10 []  - Complex (extensive) Discharge Coordination 0 PROCESS - Special Needs []  - Pediatric / Minor Patient Management 0 []  - Isolation Patient Management 0 []  - Hearing / Language / Visual special needs 0 []  - Assessment of Community assistance (transportation, D/C planning, etc.) 0 []  - Additional assistance / Altered mentation 0 []  - Support Surface(s) Assessment (bed, cushion,  seat, etc.) 0 INTERVENTIONS - Wound Cleansing / Measurement X - Simple Wound Cleansing - one wound 1 5 []  - Complex Wound Cleansing - multiple wounds 0 X -  Wound Imaging (photographs - any number of wounds) 1 5 []  - Wound Tracing (instead of photographs) 0 X - Simple Wound Measurement - one wound 1 5 []  - Complex Wound Measurement - multiple wounds 0 INTERVENTIONS - Wound Dressings X - Small Wound Dressing one or multiple wounds 1 10 []  - Medium Wound Dressing one or multiple wounds 0 []  - Large Wound Dressing one or multiple wounds 0 X - Application of Medications - topical 1 5 []  - Application of Medications - injection 0 INTERVENTIONS - Miscellaneous []  - External ear exam 0 []  - Specimen Collection (cultures, biopsies, blood, body fluids, etc.) 0 []  - Specimen(s) / Culture(s) sent or taken to Lab for analysis 0 []  - Patient Transfer (multiple staff / Civil Service fast streamer / Similar devices) 0 []  - Simple Staple / Suture removal (25 or less) 0 []  - Complex Staple / Suture removal (26 or more) 0 []  - Hypo / Hyperglycemic Management (close monitor of Blood Glucose) 0 []  - Ankle / Brachial Index (ABI) - do not check if billed separately 0 X - Vital Signs 1 5 Has the patient been seen at the hospital within the last three years: Yes Total Score: 95 Level Of Care: New/Established - Level 3 Electronic Signature(s) Signed: 08/11/2019 6:04:10 PM By: Levan Hurst RN, BSN Entered By: Levan Hurst on 08/11/2019 15:20:55 -------------------------------------------------------------------------------- Encounter Discharge Information Details Patient Name: Date of Service: [name], [name] 08/11/2019 1:45 PM Medical Record [id number] Patient Account Number: [id number] Date of Birth/Sex: Treating RN: Aug 18, 1933 (85 y.o. [name] Primary Care Pius Byrom: Simona Huh Other Clinician: Referring Jacen Carlini: Treating Maryam Feely/Extender:Robson, Vivi Barrack, Melvern Sample in  Treatment: 3 Encounter Discharge Information Items Discharge Condition: Stable Ambulatory Status: Ambulatory Discharge Destination: Home Transportation: Private Auto Accompanied By: alone Schedule Follow-up Appointment: Yes Clinical Summary of Care: Patient Declined Electronic Signature(s) Signed: 08/11/2019 6:04:10 PM By: Levan Hurst RN, BSN Entered By: Levan Hurst on 08/11/2019 15:21:22 -------------------------------------------------------------------------------- Lower Extremity Assessment Details Patient Name: Date of Service: KELTON, ACEVEDO 08/11/2019 1:45 PM Medical Record [id number] Patient Account Number: [id number] Date of Birth/Sex: Treating RN: 1933/01/15 (85 y.o. [name] Primary Care Cheyna Retana: Simona Huh Other Clinician: Referring Johntae Broxterman: Treating Caron Tardif/Extender:Robson, Vivi Barrack, Melvern Sample in Treatment: 3 Edema Assessment Assessed: [Left: No] [Right: No] Edema: [Left: N] [Right: o] Calf Left: Right: Point of Measurement: cm From Medial Instep 30 cm cm Ankle Left: Right: Point of Measurement: cm From Medial Instep 18 cm cm Vascular Assessment Pulses: Dorsalis Pedis Palpable: [Left:Yes] Electronic Signature(s) Signed: 08/11/2019 5:21:50 PM By: [name] Entered By: [name] on 08/11/2019 14:51:28 -------------------------------------------------------------------------------- Multi-Disciplinary Care Plan Details Patient Name: Date of Service: BRIANTE, MULDREW 08/11/2019 1:45 PM Medical Record [id number] Patient Account Number: [id number] Date of Birth/Sex: Treating RN: 1933-08-03 (85 y.o. [name] Primary Care Avin Gibbons: Simona Huh Other Clinician: Referring Celester Morgan: Treating Garcia Dalzell/Extender:Robson, Vivi Barrack, Melvern Sample in Treatment: 3 Active Inactive Electronic Signature(s) Signed: 08/11/2019 6:04:10 PM By: Levan Hurst RN, BSN Entered By: Levan Hurst on 08/11/2019 15:20:04 -------------------------------------------------------------------------------- Pain Assessment Details Patient Name: Date of Service: MICAL, LOUCH 08/11/2019 1:45 PM Medical Record [id number] Patient Account Number: [id number] Date of Birth/Sex: Treating RN: [birth year] (85 y.o. [name] Primary Care Nosson Wender: Simona Huh Other Clinician: Referring Cortney Beissel: Treating Giovana Faciane/Extender:Robson, Vivi Barrack, Melvern Sample in Treatment: 3 Active Problems Location of Pain Severity and Description of Pain Patient Has Paino No Site Locations Pain Management and Medication Current Pain Management:  Electronic Signature(s) Signed: 08/11/2019 5:21:50 PM By: [name] Entered By: [name] on 08/11/2019 14:47:09 -------------------------------------------------------------------------------- Patient/Caregiver Education Details Patient Name: Date of Service: Rosaura Carpenter 10/26/2020andnbsp1:45 PM Medical Record Patient Account Number: [id number] Number: Treating RN: Levan Hurst Date of Birth/Gender: 1933-09-15 (85 y.o. F) Other Clinician: Primary Care Physician: Dietrich Pates Referring Physician: Physician/Extender: Nena Alexander in Treatment: 3 Education Assessment Education Provided To: Patient Education Topics Provided Wound/Skin Impairment: Methods: Explain/Verbal Responses: State content correctly Electronic Signature(s) Signed: 08/11/2019 6:04:10 PM By: Levan Hurst RN, BSN Entered By: Levan Hurst on 08/11/2019 15:20:15 -------------------------------------------------------------------------------- Wound Assessment Details Patient Name: Date of Service: SAMINA, NEVEL 08/11/2019 1:45 PM Medical Record [id number] Patient Account Number: [id number] Date of Birth/Sex: Treating RN: August 21, 1933 (85 y.o. [name] Primary Care  Laterica Matarazzo: Simona Huh Other Clinician: Referring Trevonn Hallum: Treating Ronnell Makarewicz/Extender:Robson, Vivi Barrack, Melvern Sample in Treatment: 3 Wound Status Wound Number: 4 Primary Venous Leg Ulcer Etiology: Wound Location: Left Lower Leg - Lateral Wound Healed - Epithelialized Wounding Event: Trauma Status: Date Acquired: 07/01/2019 Comorbid Cataracts, Hypertension, Raynauds, Weeks Of Treatment: 3 History: Osteoarthritis Clustered Wound: Yes Photos Wound Measurements Length: (cm) 0 % Reduct Width: (cm) 0 % Reduct Depth: (cm) 0 Epitheli Clustered Quantity: 4 Tunnelin Area: (cm) 0 Undermi Volume: (cm) 0 Wound Description Classification: Full Thickness Without Exposed Support Structures Wound Flat and Intact Margin: Exudate None Present Amount: Wound Bed Granulation Amount: None Present (0%) Necrotic Amount: None Present (0%) Foul Odor After Cleansing: No Slough/Fibrino No Exposed Structure Fascia Exposed: No Fat Layer (Subcutaneous Tissue) Exposed: No Tendon Exposed: No Muscle Exposed: No Joint Exposed: No Bone Exposed: No ion in Area: 100% ion in Volume: 100% alization: Large (67-100%) g: No ning: No Electronic Signature(s) Signed: 08/12/2019 3:44:28 PM By: Mikeal Hawthorne EMT/HBOT Signed: 08/12/2019 5:30:52 PM By: [name] Previous Signature: 08/11/2019 5:21:50 PM Version By: [name] Previous Signature: 08/11/2019 6:04:10 PM Version By: Levan Hurst RN, BSN Entered By: Mikeal Hawthorne on 08/12/2019 08:52:05 -------------------------------------------------------------------------------- Vitals Details Patient Name: Date of Service: CRYSTL, LILLARD 08/11/2019 1:45 PM Medical Record [id number] Patient Account Number: [id number] Date of Birth/Sex: Treating RN: [birth year] (85 y.o. [name] Primary Care Jarissa Sheriff: Simona Huh Other Clinician: Referring Desera Graffeo: Treating Diamantina Edinger/Extender:Robson,  Vivi Barrack, Melvern Sample in Treatment: 3 Vital Signs Time Taken: 14:45 Temperature (F): 98.0 Height (in): 62 Pulse (bpm): 50 Weight (lbs): 105 Respiratory Rate (breaths/min): 16 Body Mass Index (BMI): 19.2 Blood Pressure (mmHg): 136/45 Reference Range: 80 - 120 mg / dl Electronic Signature(s) Signed: 08/11/2019 5:21:50 PM By: [name] Entered By: [name] on 08/11/2019 14:46:59

## 2019-08-11 NOTE — Telephone Encounter (Signed)
Left message stating due to the importance of the message I would leave it on her voicemail and informed of Dr. Leeanne Rio review of results and orders.

## 2019-08-12 NOTE — Telephone Encounter (Signed)
Pt spoke with A. Mctighe - Scheduler and was to be transferred to me, but was disconnected.

## 2019-08-12 NOTE — Telephone Encounter (Signed)
I called pt and stated she was quite alarmed that she had MRSA. Pt asked if it was contagious and I told her yes if she had open wound that was exposed and she should not share towels and such, and continue good hand washing, especially after wound care.

## 2019-08-13 ENCOUNTER — Other Ambulatory Visit: Payer: Self-pay

## 2019-08-13 ENCOUNTER — Ambulatory Visit (INDEPENDENT_AMBULATORY_CARE_PROVIDER_SITE_OTHER): Payer: PPO | Admitting: Podiatry

## 2019-08-13 ENCOUNTER — Encounter: Payer: Self-pay | Admitting: Podiatry

## 2019-08-13 ENCOUNTER — Ambulatory Visit (INDEPENDENT_AMBULATORY_CARE_PROVIDER_SITE_OTHER): Payer: PPO

## 2019-08-13 DIAGNOSIS — Z09 Encounter for follow-up examination after completed treatment for conditions other than malignant neoplasm: Secondary | ICD-10-CM

## 2019-08-13 DIAGNOSIS — M2041 Other hammer toe(s) (acquired), right foot: Secondary | ICD-10-CM

## 2019-08-13 MED ORDER — CLINDAMYCIN HCL 150 MG PO CAPS: 150.0000 mg | ORAL_CAPSULE | Freq: Three times a day (TID) | ORAL | 1 refills | Status: DC

## 2019-08-14 ENCOUNTER — Telehealth: Payer: Self-pay | Admitting: *Deleted

## 2019-08-14 NOTE — Progress Notes (Signed)
Subjective:   Patient ID: [name], female   DOB: 85 y.o.   MRN: [id number]   HPI Patient presents for check after having cultures done of her right hallux with minimal current redness noted at the second digit hallux with stitches intact and slight breakdown of tissue on the hallux but localized with no subcutaneous exposure or no proximal edema erythema noted.  Patient is having minimal discomfort but is quite antsy about her progress even though she is only 3 weeks postop   ROS      Objective:  Physical Exam  Neurovascular status found to be intact negative [name]' sign was noted with stitches intact second digit right hallux right.  There is some slight crusted tissue on the hallux but it is localized with no subcutaneous exposure and I did not note any proximal edema erythema or drainage noted currently in the second digit appears to be healing well with no proximal edema erythema or drainage noted     Assessment:  Patient did have MRSA bacteriuria based on culture results but appears to be responding well currently to clindamycin and is actually doing quite well for only being 3 weeks postop     Plan:  H&P x-ray reviewed.  Stitches removed wound edges coapted well no drainage was noted and I did apply a small dressing to the right hallux where there is an area of irritation to be precautionary.  I gave her strict instructions if she should get any increase in redness any systemic signs of infection or other pathology to let us know immediately and we are can keep her on the clindamycin as a precautionary measure for the next 10 days but that this should heal uneventfully and will be watched carefully.  Patient will be seen back in 1 week or earlier if any issues were to occur  X-rays indicate satisfactory section of bone for the right second digit hallux and I did explain to her that due to the fact where her corn was located on the inside of the second toe I did have to take more  bone on that side so there would not be bone to bone pressure

## 2019-08-14 NOTE — Telephone Encounter (Signed)
Left message informing pt Dr. Paulla Dolly had sent her clindamycin refill to the [facility] on [address] yesterday and was confirmed received by the pharmacy at 3:07pm.

## 2019-08-14 NOTE — Telephone Encounter (Signed)
Pt states she was seen yesterday and her clindamycin should have been extended.

## 2019-08-19 DIAGNOSIS — M4690 Unspecified inflammatory spondylopathy, site unspecified: Secondary | ICD-10-CM | POA: Diagnosis not present

## 2019-08-19 DIAGNOSIS — M150 Primary generalized (osteo)arthritis: Secondary | ICD-10-CM | POA: Diagnosis not present

## 2019-08-19 DIAGNOSIS — M154 Erosive (osteo)arthritis: Secondary | ICD-10-CM | POA: Diagnosis not present

## 2019-08-19 DIAGNOSIS — Z791 Long term (current) use of non-steroidal anti-inflammatories (NSAID): Secondary | ICD-10-CM | POA: Diagnosis not present

## 2019-08-19 DIAGNOSIS — Z1589 Genetic susceptibility to other disease: Secondary | ICD-10-CM | POA: Diagnosis not present

## 2019-08-20 ENCOUNTER — Encounter: Payer: Self-pay | Admitting: Certified Nurse Midwife

## 2019-08-20 ENCOUNTER — Ambulatory Visit (INDEPENDENT_AMBULATORY_CARE_PROVIDER_SITE_OTHER): Payer: PPO | Admitting: Certified Nurse Midwife

## 2019-08-20 ENCOUNTER — Telehealth: Payer: Self-pay | Admitting: *Deleted

## 2019-08-20 ENCOUNTER — Other Ambulatory Visit: Payer: Self-pay

## 2019-08-20 VITALS — BP 120/64 | HR 68 | Resp 16 | Ht 61.75 in | Wt 102.0 lb

## 2019-08-20 DIAGNOSIS — Z01419 Encounter for gynecological examination (general) (routine) without abnormal findings: Secondary | ICD-10-CM

## 2019-08-20 NOTE — Patient Instructions (Signed)

## 2019-08-20 NOTE — Progress Notes (Signed)
85 y.o. G97P2001 Married  Caucasian Fe here for annual exam. Post menopausal with ERT use( vivelle dot). Denies vaginal bleeding or dryness. Continues with Premarin cream vaginal once weekly for dryness and UTI prevention. Patient currently on Clindamycin for the second time for infection in her foot from surgery. Patient complaining of diarrhea that has increased during taking the medication. She has not called the MD who prescribed this regarding this. Denies fever, chills or headache. Drinking adequate fluids and has started eating yogurt to see if this will help. Staying acitve. Sees PCP Dr. Marisue Humble for aex,labs, Ambien management. No other health issues today.  No LMP recorded. Patient has had a hysterectomy.          Sexually active: Yes.    The current method of family planning is status post hysterectomy.    Exercising: No.  exercise Smoker:  no  Review of Systems  Constitutional: Negative.   HENT: Negative.   Eyes: Negative.   Respiratory: Negative.   Cardiovascular: Negative.   Gastrointestinal: Positive for diarrhea.  Genitourinary: Negative.   Musculoskeletal: Negative.   Skin: Negative.   Neurological: Negative.   Endo/Heme/Allergies: Negative.   Psychiatric/Behavioral: Negative.     Health Maintenance: Pap:  7/09 neg History of Abnormal Pap: no MMG:  09-05-18 category b density birads 1:neg Self Breast exams: yes Colonoscopy:  2019 normal, endoscopy 2020 BMD:   2017 osteopenia TDaP:  2010 Shingles: 2019 Pneumonia: had done Hep C and HIV: not done Labs: if needed   reports that she has quit smoking. Her smoking use included cigarettes. She has a 45.00 pack-year smoking history. She has never used smokeless tobacco. She reports current alcohol use. She reports that she does not use drugs.  Past Medical History:  Diagnosis Date  . Arthritis    "osteoarthritis"  . 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 GERD (gastroesophageal reflux  disease)   . Headache(784.0) 08/28/11   "just for the last few days; releated to dehydration"  . Hearing aid worn   . Shortness of breath 08/28/11   "hard time breathing deeply because of the pain"    Past Surgical History:  Procedure Laterality Date  . BACK SURGERY  1/06   "spinal stenosis"  . CATARACT EXTRACTION W/ INTRAOCULAR LENS  IMPLANT, BILATERAL  Summer 2011  . FACIAL COSMETIC SURGERY  ~ 2002  . FINGER SURGERY  ~ 2000   "titanium rod in right pointer; from arthritis"  . SHOULDER SURGERY Left 03/2018  . TONSILLECTOMY  1943  . TUBAL LIGATION  1970's  . VAGINAL HYSTERECTOMY  2/04   "still have my ovaries"    Current Outpatient Medications  Medication Sig Dispense Refill  . Acetaminophen (TYLENOL ARTHRITIS PAIN PO) Take by mouth.    . ALPRAZolam (XANAX) 0.25 MG tablet TK 1/2 TO 1 T PO BID PRA    . B Complex Vitamins (B-COMPLEX/B-12 PO) Take by mouth daily.    . clindamycin (CLEOCIN) 150 MG capsule Take 1 capsule (150 mg total) by mouth 3 (three) times daily. 30 capsule 0  . clindamycin (CLEOCIN) 150 MG capsule Take 1 capsule (150 mg total) by mouth 3 (three) times daily. 30 capsule 1  . conjugated estrogens (PREMARIN) vaginal cream PLACE 0.5 GRAM VAGINALLY 2 TIMES A WEEK AS DIRECTED 30 g 0  . diclofenac sodium (VOLTAREN) 1 % GEL     . doxycycline (VIBRA-TABS) 100 MG tablet Take 1 tablet (100 mg total) by mouth 2 (two)  times daily. 28 tablet 0  . estradiol (VIVELLE-DOT) 0.025 MG/24HR APPLY 1/2 PATCH EXTERNALLY TO THE SKIN 2 TIMES A WEEK 12 patch 0  . hydrochlorothiazide (MICROZIDE) 12.5 MG capsule     . HYDROcodone-acetaminophen (NORCO) 10-325 MG tablet Take 1 tablet by mouth every 6 (six) hours as needed.    Marland Kitchen ipratropium (ATROVENT) 0.06 % nasal spray U 2 SPRAYS IEN TID PRF RHINITIS  5  . meloxicam (MOBIC) 15 MG tablet Take 15 mg by mouth daily.     . metoprolol succinate (TOPROL-XL) 25 MG 24 hr tablet TK 1 T PO ONCE A DAY FOR BP AND HEART RATE CONTROL    . NONFORMULARY OR  COMPOUNDED ITEM TESTOSTERONE PROPIONATE 2% PETROLATUM (JAR)  APPLY ONE TEAR DROP SIZE AMOUNT TO VAGINAL AREA AT BEDTIME TWO TIMES WEEKLY. 60 each 1  . OVER THE COUNTER MEDICATION Place 1 drop into both eyes 2 (two) times daily as needed (redness/ dry eyes). Over the counter eye drops    . PROCTOZONE-HC 2.5 % rectal cream APPLY ONE APPLICATION AA TWICE TO TID AROUND RECTUM    . ranitidine (ZANTAC) 75 MG tablet Take 75 mg by mouth 2 (two) times daily.    . RESTASIS 0.05 % ophthalmic emulsion INT 1 GTT INTO OU BID    . sulfaSALAzine (AZULFIDINE) 500 MG tablet TK 1 T PO ONCE A DAY    . triamcinolone cream (KENALOG) 0.1 % APP TO NAIL 2 TO 3 XD FOR 1 MONTH UTD    . valACYclovir (VALTREX) 1000 MG tablet as needed.     . zolpidem (AMBIEN) 10 MG tablet TK 1/2 TO 1 T PO B BED PRN FOR SLEEP DIFFICULTY    . zolpidem (AMBIEN) 5 MG tablet Take 1 tablet (5 mg total) by mouth at bedtime as needed for sleep (insomnia). 15 tablet 0   No current facility-administered medications for this visit.     Family History  Problem Relation Age of Onset  . Cancer Father        colon    ROS:  Pertinent items are noted in HPI.  Otherwise, a comprehensive ROS was negative.  Exam:   There were no vitals taken for this visit.   Ht Readings from Last 3 Encounters:  08/08/18 5\' 2"  (1.575 m)  08/01/17 5\' 2"  (1.575 m)  01/27/17 5\' 2"  (1.575 m)    General appearance: alert, cooperative and appears stated age Head: Normocephalic, without obvious abnormality, atraumatic Neck: no adenopathy, supple, symmetrical, trachea midline and thyroid normal to inspection and palpation Lungs: clear to auscultation bilaterally Breasts: normal appearance, no masses or tenderness, No nipple retraction or dimpling, No nipple discharge or bleeding, No axillary or supraclavicular adenopathy, implants present bilateral Heart: regular rate and rhythm Abdomen: soft, non-tender; no masses,  no organomegaly Extremities: extremities normal,  atraumatic, no cyanosis or edema Skin: Skin color, texture, turgor normal. No rashes or lesions Lymph nodes: Cervical, supraclavicular, and axillary nodes normal. No abnormal inguinal nodes palpated Neurologic: Grossly normal   Pelvic: External genitalia:  no lesions              Urethra:  normal appearing urethra with no masses, tenderness or lesions              Bartholin's and Skene's: normal                 Vagina: normal appearing vagina with normal color and discharge, no lesions  Cervix: absent              Pap taken: No. Bimanual Exam:  Uterus:  uterus absent              Adnexa: normal adnexa and no mass, fullness, tenderness               Rectovaginal: Confirms               Anus:  normal sphincter tone, no lesions, but redness around rectum from frequent diarrhea, no blood noted  Chaperone present: yes  A:  Well Woman with normal exam  Post menopausal on ERT for bone support, s/p TVH with ovaries retained  Atrophic vaginitis using Premarin cream with good results  Clindamycin use for foot infection having diarrhea  Immunization update TDAP  P:   Reviewed health and wellness pertinent to exam  Discussed risks/benefits/warning signs with use. Patient desires continuance.  Rx Vivelle -Dot see order with instructions.  Rx Premarin cream see order with instructions  Discussed starting on oral probiotic and calling MD who prescribed the Clindamycin with her concerns with continued use and worry of C-Diff. Patient may also call PCP. Warning signs with diarrhea given.  Patient declined.  Pap smear: no   counseled on breast self exam, mammography screening, feminine hygiene, adequate intake of calcium and vitamin D, diet and exercise, Kegel's exercises  return annually or prn  An After Visit Summary was printed and given to the patient.

## 2019-08-20 NOTE — Telephone Encounter (Signed)
I called pt and informed that she could stop the Clindamycin, and I asked the status of the foot. Pt states the foot is fine, and she has an appt tomorrow.

## 2019-08-20 NOTE — Telephone Encounter (Signed)
Pt states she is on her 10th day of the clindamycin and her 8th day of diarrhea and has stopped the medication.

## 2019-08-21 ENCOUNTER — Encounter: Payer: Self-pay | Admitting: Podiatry

## 2019-08-21 ENCOUNTER — Ambulatory Visit: Payer: PPO

## 2019-08-21 ENCOUNTER — Ambulatory Visit (INDEPENDENT_AMBULATORY_CARE_PROVIDER_SITE_OTHER): Payer: PPO | Admitting: Podiatry

## 2019-08-21 DIAGNOSIS — M2041 Other hammer toe(s) (acquired), right foot: Secondary | ICD-10-CM

## 2019-08-21 DIAGNOSIS — Z09 Encounter for follow-up examination after completed treatment for conditions other than malignant neoplasm: Secondary | ICD-10-CM

## 2019-08-21 DIAGNOSIS — L03031 Cellulitis of right toe: Secondary | ICD-10-CM

## 2019-08-21 DIAGNOSIS — L02611 Cutaneous abscess of right foot: Secondary | ICD-10-CM

## 2019-08-21 LAB — CBC WITH DIFFERENTIAL/PLATELET
Absolute Monocytes: 803 cells/uL (ref 200–950)
Basophils Absolute: 39 cells/uL (ref 0–200)
Basophils Relative: 0.7 %
Eosinophils Absolute: 77 cells/uL (ref 15–500)
Eosinophils Relative: 1.4 %
HCT: 38.1 % (ref 35.0–45.0)
Hemoglobin: 12.9 g/dL (ref 11.7–15.5)
Lymphs Abs: 1353 cells/uL (ref 850–3900)
MCH: 32 pg (ref 27.0–33.0)
MCHC: 33.9 g/dL (ref 32.0–36.0)
MCV: 94.5 fL (ref 80.0–100.0)
MPV: 10.5 fL (ref 7.5–12.5)
Monocytes Relative: 14.6 %
Neutro Abs: 3229 cells/uL (ref 1500–7800)
Neutrophils Relative %: 58.7 %
Platelets: 423 10*3/uL — ABNORMAL HIGH (ref 140–400)
RBC: 4.03 10*6/uL (ref 3.80–5.10)
RDW: 11.8 % (ref 11.0–15.0)
Total Lymphocyte: 24.6 %
WBC: 5.5 10*3/uL (ref 3.8–10.8)

## 2019-08-21 LAB — SEDIMENTATION RATE: Sed Rate: 9 mm/h (ref 0–30)

## 2019-08-22 NOTE — Progress Notes (Signed)
Subjective:   Patient ID: [name], female   DOB: 85 y.o.   MRN: [id number]   HPI Patient states that she is continuing to experience discomfort in her right foot and she had a reaction to the clindamycin that she was on and developed diarrhea.  Patient is concerned about the digits and states that the drainage has reduced but she still has concerns.  Patient is now approximately 4 weeks after having digital surgery right second toe and exostectomy of the right hallux   ROS      Objective:  Physical Exam  Neurovascular status was found to be intact.  Incision site on the second toe right has healed completely and there is some crusted tissue on the right hallux lateral side but there is no active drainage currently and there is no subcutaneous exposure.  There is no current proximal erythema but there is some slight increased calor in the foot and mild swelling in the foot with no streaking or other pathology which appears to be occurring from the wounds themselves.  Patient is wearing a sandal type shoe at the current time.  Patient had negative [name]' sign noted and no systemic fever or indications of systemic infection     Assessment:  Patient who had a MRSA infection postoperatively who is not currently on antibiotics after approximate 3-1/2 weeks of treatment with doxycycline and clindamycin and has a crusted area on the right hallux and healed second digit with mild discomfort in the foot itself that seems to occur periodically     Plan:  Appears to be inflammatory reaction currently but I cannot rule out infective reaction with history of MRSA and I am sending for CBC with differential and sed rate.  I also had Dr. Carman Ching evaluate this patient with me and he agrees and we will continue for several more days with Iodosorb and then we are going to switch her over to Burna for the small area of irritation on the right hallux.  I also gave her a toe separator to keep the hallux and  second toes separated to try to create more airflow to the area and I do believe that this should heal and I tried to make the patient comfortable with this fact.  I will contact her when we get the results of blood work and depending on response we will decide what might be appropriate and if it appears to be inflammatory we will consider Unna boot therapy.  Patient is instructed to call with any questions or if any changes were to occur

## 2019-08-25 ENCOUNTER — Ambulatory Visit (INDEPENDENT_AMBULATORY_CARE_PROVIDER_SITE_OTHER): Payer: PPO | Admitting: Podiatry

## 2019-08-25 ENCOUNTER — Encounter: Payer: Self-pay | Admitting: Podiatry

## 2019-08-25 ENCOUNTER — Ambulatory Visit: Payer: PPO

## 2019-08-25 ENCOUNTER — Other Ambulatory Visit: Payer: Self-pay

## 2019-08-25 VITALS — BP 131/63

## 2019-08-25 DIAGNOSIS — M2041 Other hammer toe(s) (acquired), right foot: Secondary | ICD-10-CM | POA: Diagnosis not present

## 2019-08-25 DIAGNOSIS — R600 Localized edema: Secondary | ICD-10-CM

## 2019-08-28 ENCOUNTER — Encounter: Payer: Self-pay | Admitting: Podiatry

## 2019-08-28 ENCOUNTER — Other Ambulatory Visit: Payer: Self-pay

## 2019-08-28 ENCOUNTER — Ambulatory Visit (INDEPENDENT_AMBULATORY_CARE_PROVIDER_SITE_OTHER): Payer: PPO | Admitting: Podiatry

## 2019-08-28 DIAGNOSIS — Z09 Encounter for follow-up examination after completed treatment for conditions other than malignant neoplasm: Secondary | ICD-10-CM

## 2019-08-28 DIAGNOSIS — M2041 Other hammer toe(s) (acquired), right foot: Secondary | ICD-10-CM

## 2019-08-28 NOTE — Progress Notes (Signed)
Subjective:   Patient ID: [name], female   DOB: 85 y.o.   MRN: [id number]   HPI Patient presents stating that she has been using the new medicine on her toe and she is not noted drainage and states that her foot still get swollen and is bothersome.  Results of blood work did not indicate infection at this point   ROS      Objective:  Physical Exam  Neurovascular status unchanged with second digit right healing well with mild edema and hallux showing a small crusted tissue on the lateral side of the toe that upon debridement does not show drainage or subcutaneous exposure.  There is no proximal erythema noted and there is mild edema of the foot with no erythema extension to it with mild discomfort with palpation to the foot itself     Assessment:  Appears to be gradually improving with possibility that there is inflammatory irritation and pain of the foot versus infective process currently     Plan:  Reviewed both conditions and while there still is no guarantees it appears that we are dealing more with inflammation and we are can continue to watch this very carefully but I want to try to reduce edema so I went ahead today and applied Unna boot for several days to try to take out swelling and will reevaluate her again on Friday.  I gave strict instructions of any changes were to occur or any redness drainage or systemic signs of infection to contact us immediately

## 2019-09-01 DIAGNOSIS — K529 Noninfective gastroenteritis and colitis, unspecified: Secondary | ICD-10-CM | POA: Diagnosis not present

## 2019-09-01 DIAGNOSIS — K219 Gastro-esophageal reflux disease without esophagitis: Secondary | ICD-10-CM | POA: Diagnosis not present

## 2019-09-02 NOTE — Progress Notes (Signed)
Subjective:   Patient ID: [name], female   DOB: 85 y.o.   MRN: [id number]   HPI Patient states is doing much better and she is currently having minimal discomfort in her foot and has not noted any drainage   ROS      Objective:  Physical Exam  Neurovascular status intact with patient's right foot swelling in the foot reduced quite a bit with Unna boot with second digit healed excellent and hallux healed very well with a small amount of crusted tissue on the lateral side but  no drainage noted     Assessment:  Appears to be doing well after having had surgery and localized MRSA infection     Plan:  Continue with separating the digits and using Medihoney for another week and padding therapy and patient will be seen back if any issues were to occur but at this point appears to be stable

## 2019-09-12 ENCOUNTER — Other Ambulatory Visit: Payer: Self-pay | Admitting: Certified Nurse Midwife

## 2019-09-15 NOTE — Telephone Encounter (Signed)
Medication refill request: Vivelle-dot  Last AEX:  08-20-2019 DL  Next AEX: 08-23-20  Last MMG (if hormonal medication request): 09-05-18 density B/BIRADS 1 negative  Refill authorized: Today, please advise.   Medication pended for #24, 0RF. Please refill if appropriate.

## 2019-09-23 NOTE — Progress Notes (Signed)
[name], [name] ([id number]) Visit Report for 07/15/2019 Allergy List Details Patient Name: Date of Service: [name], [name] (. 07/15/2019 10:30 AM Medical Record [id number] Patient Account Number: [id number] Date of Birth/Sex: Treating RN: January 17, 1933 (85 y.o. [name] Primary Care Rene Gonsoulin: Simona Huh Other Clinician: Sandre Kitty Referring Anden Bartolo: Treating Anaiza Behrens/Extender:Robson, Vivi Barrack, Melvern Sample in Treatment: 0 Allergies Active Allergies No Known Allergies Allergy Notes Electronic Signature(s) Signed: 07/15/2019 5:36:16 PM By: Baruch Gouty RN, BSN Entered By: Baruch Gouty on 07/15/2019 10:40:16 -------------------------------------------------------------------------------- Arrival Information Details Patient Name: Date of Service: [name], [name] (. 07/15/2019 10:30 AM Medical Record [id number] Patient Account Number: [id number] Date of Birth/Sex: Treating RN: 07-11-1933 (85 y.o. [name] Primary Care Aundrey Elahi: Simona Huh Other Clinician: Sandre Kitty Referring Kyria Bumgardner: Treating Janny Crute/Extender:Robson, Vivi Barrack, Melvern Sample in Treatment: 0 Visit Information Patient Arrived: Ambulatory Arrival Time: 10:37 Accompanied By: self Transfer Assistance: None Patient Identification Verified: Yes Secondary Verification Process Completed: Yes Patient Requires Transmission-Based No Precautions: Patient Has Alerts: No History Since Last Visit Had a fall or experienced change in activities of daily living that may affect risk of falls: No Signs or symptoms of abuse/neglect since last visito No Hospitalized since last visit: No Electronic Signature(s) Signed: 07/15/2019 5:36:16 PM By: Baruch Gouty RN, BSN Entered By: Baruch Gouty on 07/15/2019 10:38:26 -------------------------------------------------------------------------------- Clinic Level of Care Assessment Details Patient Name: Date of  Service: [name], [name] (. 07/15/2019 10:30 AM Medical Record [id number] Patient Account Number: [id number] Date of Birth/Sex: Treating RN: July 07, 1933 (85 y.o. Orvan Falconer Primary Care Randal Goens: Simona Huh Other Clinician: Sandre Kitty Referring Caroline Longie: Treating Phyllistine Domingos/Extender:Robson, Vivi Barrack, Melvern Sample in Treatment: 0 Clinic Level of Care Assessment Items TOOL 2 Quantity Score X - Use when only an EandM is performed on the INITIAL visit 1 0 ASSESSMENTS - Nursing Assessment / Reassessment X - General Physical Exam (combine w/ comprehensive assessment (listed just below) 1 20 when performed on new pt. evals) X - Comprehensive Assessment (HX, ROS, Risk Assessments, Wounds Hx, etc.) 1 25 ASSESSMENTS - Wound and Skin Assessment / Reassessment X - Simple Wound Assessment / Reassessment - one wound 1 5 []  - Complex Wound Assessment / Reassessment - multiple wounds 0 []  - Dermatologic / Skin Assessment (not related to wound area) 0 ASSESSMENTS - Ostomy and/or Continence Assessment and Care []  - Incontinence Assessment and Management 0 []  - Ostomy Care Assessment and Management (repouching, etc.) 0 PROCESS - Coordination of Care X - Simple Patient / Family Education for ongoing care 1 15 []  - Complex (extensive) Patient / Family Education for ongoing care 0 X - Staff obtains Programmer, systems, Records, Test Results / Process Orders 1 10 []  - Staff telephones HHA, Nursing Homes / Clarify orders / etc 0 []  - Routine Transfer to another Facility (non-emergent condition) 0 []  - Routine Hospital Admission (non-emergent condition) 0 []  - New Admissions / Biomedical engineer / Ordering NPWT, Apligraf, etc. 0 []  - Emergency Hospital Admission (emergent condition) 0 X - Simple Discharge Coordination 1 10 []  - Complex (extensive) Discharge Coordination 0 PROCESS - Special Needs []  - Pediatric / Minor Patient Management 0 []  - Isolation Patient Management 0 []  -  Hearing / Language / Visual special needs 0 []  - Assessment of Community assistance (transportation, D/C planning, etc.) 0 []  - Additional assistance / Altered mentation 0 []  - Support Surface(s) Assessment (bed, cushion, seat, etc.) 0 INTERVENTIONS - Wound Cleansing / Measurement X - Wound Imaging (photographs - any number  of wounds) 1 5 []  - Wound Tracing (instead of photographs) 0 X - Simple Wound Measurement - one wound 1 5 []  - Complex Wound Measurement - multiple wounds 0 X - Simple Wound Cleansing - one wound 1 5 []  - Complex Wound Cleansing - multiple wounds 0 INTERVENTIONS - Wound Dressings []  - Small Wound Dressing one or multiple wounds 0 []  - Medium Wound Dressing one or multiple wounds 0 X - Large Wound Dressing one or multiple wounds 1 20 []  - Application of Medications - injection 0 INTERVENTIONS - Miscellaneous []  - External ear exam 0 []  - Specimen Collection (cultures, biopsies, blood, body fluids, etc.) 0 []  - Specimen(s) / Culture(s) sent or taken to Lab for analysis 0 []  - Patient Transfer (multiple staff / Harrel Lemon Lift / Similar devices) 0 []  - Simple Staple / Suture removal (25 or less) 0 []  - Complex Staple / Suture removal (26 or more) 0 []  - Hypo / Hyperglycemic Management (close monitor of Blood Glucose) 0 X - Ankle / Brachial Index (ABI) - do not check if billed separately 1 15 Has the patient been seen at the hospital within the last three years: Yes Total Score: 135 Level Of Care: New/Established - Level 4 Electronic Signature(s) Signed: 09/23/2019 2:58:33 PM By: Carlene Coria RN Entered By: Carlene Coria on 07/15/2019 11:25:15 -------------------------------------------------------------------------------- Encounter Discharge Information Details Patient Name: Date of Service: [name], [name] (. 07/15/2019 10:30 AM Medical Record [id number] Patient Account Number: [id number] Date of Birth/Sex: Treating RN: August 09, 1933 (85 y.o. [name] Primary Care Azaan Leask: Simona Huh Other Clinician: Sandre Kitty Referring Dorris Vangorder: Treating Jaque Dacy/Extender:Robson, Vivi Barrack, Melvern Sample in Treatment: 0 Encounter Discharge Information Items Discharge Condition: Stable Ambulatory Status: Ambulatory Discharge Destination: Home Transportation: Private Auto Accompanied By: self Schedule Follow-up Appointment: Yes Clinical Summary of Care: Patient Declined Electronic Signature(s) Signed: 07/24/2019 4:32:31 PM By: [name] Entered By: [name] on 07/15/2019 11:39:41 -------------------------------------------------------------------------------- Lower Extremity Assessment Details Patient Name: Date of Service: [name], [name] (. 07/15/2019 10:30 AM Medical Record [id number] Patient Account Number: [id number] Date of Birth/Sex: Treating RN: 1932-10-25 (85 y.o. [name] Primary Care Bralyn Folkert: Simona Huh Other Clinician: Sandre Kitty Referring Kaileigh Viswanathan: Treating Raydan Schlabach/Extender:Robson, Vivi Barrack, Melvern Sample in Treatment: 0 Edema Assessment Assessed: [Left: No] [Right: No] Edema: [Left: Ye] [Right: s] Calf Left: Right: Point of Measurement: cm From Medial Instep 31.5 cm cm Ankle Left: Right: Point of Measurement: cm From Medial Instep 18.5 cm cm Vascular Assessment Pulses: Dorsalis Pedis Palpable: [Left:No] Blood Pressure: Brachial: [Left:140] Dorsalis Pedis: 162 Ankle: Posterior Tibial: 166 Ankle Brachial Index: [Left:1.19] Electronic Signature(s) Signed: 07/15/2019 5:36:16 PM By: Baruch Gouty RN, BSN Entered By: Baruch Gouty on 07/15/2019 11:01:40 -------------------------------------------------------------------------------- Multi Wound Chart Details Patient Name: Date of Service: [name] (. 07/15/2019 10:30 AM Medical Record [id number] Patient Account Number: [id number] Date of Birth/Sex: Treating RN: 1933-01-02 (85  y.o. F) Primary Care Jessica Checketts: Simona Huh Other Clinician: Sandre Kitty Referring Teah Votaw: Treating Jahrel Borthwick/Extender:Robson, Vivi Barrack, Melvern Sample in Treatment: 0 Vital Signs Height(in): 62 Pulse(bpm): 70 Weight(lbs): 105 Blood Pressure(mmHg): 171/55 Body Mass Index(BMI): 19 Temperature(F): 97.5 Respiratory 18 Rate(breaths/min): Photos: [4:No Photos] [N/A:N/A] Wound Location: [4:Left Lower Leg - Lateral] [N/A:N/A] Wounding Event: [4:Trauma] [N/A:N/A] Primary Etiology: [4:Venous Leg Ulcer] [N/A:N/A] Comorbid History: [4:Cataracts, Hypertension, Raynauds, Osteoarthritis] [N/A:N/A] Date Acquired: [4:07/01/2019] [N/A:N/A] Weeks of Treatment: [4:0] [N/A:N/A] Wound Status: [4:Open] [N/A:N/A] Clustered Wound: [4:Yes] [N/A:N/A] Measurements L x W x D 3.5x2x0.1 [N/A:N/A] (cm) Area (cm) : [4:5.498] [  N/A:N/A] Volume (cm) : [4:0.55] [N/A:N/A] Classification: [4:Full Thickness Without Exposed Support Structures] [N/A:N/A] Exudate Amount: [4:Medium] [N/A:N/A] Exudate Type: [4:Serosanguineous] [N/A:N/A] Exudate Color: [4:red, brown] [N/A:N/A] Wound Margin: [4:Flat and Intact] [N/A:N/A] Granulation Amount: [4:Large (67-100%)] [N/A:N/A] Granulation Quality: [4:Red, Hyper-granulation] [N/A:N/A] Necrotic Amount: [4:None Present (0%)] [N/A:N/A] Exposed Structures: [4:Fat Layer (Subcutaneous N/A Tissue) Exposed: Yes Fascia: No Tendon: No Muscle: No Joint: No Bone: No Small (1-33%)] [N/A:N/A] Treatment Notes Wound #4 (Left, Lateral Lower Leg) 1. Cleanse With Wound Cleanser Soap and water 3. Primary Dressing Applied Hydrofera Blue 4. Secondary Dressing Dry Gauze 6. Support Layer Holiday representative) Signed: 07/15/2019 5:43:25 PM By: Linton Ham MD Entered By: Linton Ham on 07/15/2019 11:58:44 -------------------------------------------------------------------------------- Multi-Disciplinary Care Plan Details Patient Name: Date  of Service: [name], [name] (. 07/15/2019 10:30 AM Medical Record [id number] Patient Account Number: [id number] Date of Birth/Sex: Treating RN: 1932/11/15 (85 y.o. Orvan Falconer Primary Care Jazzalyn Loewenstein: Simona Huh Other Clinician: Sandre Kitty Referring Roxas Clymer: Treating Javell Blackburn/Extender:Robson, Vivi Barrack, Melvern Sample in Treatment: 0 Active Inactive Wound/Skin Impairment Nursing Diagnoses: Knowledge deficit related to ulceration/compromised skin integrity Goals: Patient/caregiver will verbalize understanding of skin care regimen Date Initiated: 07/15/2019 Target Resolution Date: 08/08/2019 Goal Status: Active Ulcer/skin breakdown will have a volume reduction of 30% by week 4 Date Initiated: 07/15/2019 Target Resolution Date: 08/08/2019 Goal Status: Active Interventions: Assess patient/caregiver ability to obtain necessary supplies Assess patient/caregiver ability to perform ulcer/skin care regimen upon admission and as needed Assess ulceration(s) every visit Notes: Electronic Signature(s) Signed: 09/23/2019 2:58:33 PM By: Carlene Coria RN Entered By: Carlene Coria on 07/15/2019 11:23:42 -------------------------------------------------------------------------------- Pain Assessment Details Patient Name: Date of Service: [name], [name] (. 07/15/2019 10:30 AM Medical Record [id number] Patient Account Number: [id number] Date of Birth/Sex: Treating RN: Jan 26, 1933 (85 y.o. [name] Primary Care Malikiah Debarr: Simona Huh Other Clinician: Sandre Kitty Referring Audriana Aldama: Treating Juelz Whittenberg/Extender:Robson, Vivi Barrack, Melvern Sample in Treatment: 0 Active Problems Location of Pain Severity and Description of Pain Patient Has Paino Yes Site Locations Pain Location: Pain in Ulcers With Dressing Change: Yes Duration of the Pain. Constant / Intermittento Intermittent Rate the pain. Current Pain Level: 0 Worst Pain Level: 7 Least Pain  Level: 0 Character of Pain Describe the Pain: Tender Pain Management and Medication Current Pain Management: Medication: Yes Is the Current Pain Management Adequate: Adequate How does your wound impact your activities of daily livingo Sleep: No Bathing: No Appetite: No Relationship With Others: No Bladder Continence: No Emotions: No Bowel Continence: No Work: No Toileting: No Drive: No Dressing: No Hobbies: No Electronic Signature(s) Signed: 07/15/2019 5:36:16 PM By: Baruch Gouty RN, BSN Entered By: Baruch Gouty on 07/15/2019 10:40:00 -------------------------------------------------------------------------------- Patient/Caregiver Education Details Patient Name: Date of Service: [name], [name] (. 9/29/2020andnbsp10:30 AM Medical Record [id number] Patient Account Number: [id number] Date of Birth/Gender: 07-14-1933 (85 y.o. F) Treating RN: Carlene Coria Primary Care Physician: Simona Huh Other Clinician: Sandre Kitty Referring Physician: Treating Physician/Extender:Robson, Vivi Barrack, Melvern Sample in Treatment: 0 Education Assessment Education Provided To: Patient Education Topics Provided Wound/Skin Impairment: Methods: Explain/Verbal Responses: State content correctly Electronic Signature(s) Signed: 09/23/2019 2:58:33 PM By: Carlene Coria RN Entered By: Carlene Coria on 07/15/2019 11:23:53 -------------------------------------------------------------------------------- Wound Assessment Details Patient Name: Date of Service: [name], [name] 07/15/2019 10:30 AM Medical Record [id number] Patient Account Number: [id number] Date of Birth/Sex: Treating RN: [birth year] (85 y.o. [name] Primary Care Kieon Lawhorn: Simona Huh Other Clinician: Sandre Kitty Referring Coltyn Hanning: Treating Lennette Fader/Extender:Robson, Vivi Barrack, Melvern Sample in Treatment: 0  Wound Status Wound Number: 4 Primary Venous Leg Ulcer Etiology: Wound  Location: Left Lower Leg - Lateral Wound Open Wounding Event: Trauma Status: Date Acquired: 07/01/2019 Comorbid Cataracts, Hypertension, Raynauds, Weeks Of Treatment: 0 History: Osteoarthritis Clustered Wound: Yes Photos Wound Measurements Length: (cm) 3.5 % Reduc Width: (cm) 2 % Reduc Depth: (cm) 0.1 Epithel Area: (cm) 5.498 Tunnel Volume: (cm) 0.55 Underm Wound Description Classification: Full Thickness Without Exposed Support Foul Od Structures Slough/ Wound Flat and Intact Margin: Exudate Medium Amount: Exudate Serosanguineous Type: Exudate red, brown Color: Wound Bed Granulation Amount: Large (67-100%) Granulation Quality: Red, Hyper-granulation Fascia Necrotic Amount: None Present (0%) Fat Lay Tendon Muscle Joint E Bone Ex or After Cleansing: No Fibrino No Exposed Structure Exposed: No er (Subcutaneous Tissue) Exposed: Yes Exposed: No Exposed: No xposed: No posed: No tion in Area: 0% tion in Volume: 0% ialization: Small (1-33%) ing: No ining: No Electronic Signature(s) Signed: 07/16/2019 6:37:17 PM By: Baruch Gouty RN, BSN Signed: 07/17/2019 3:21:35 PM By: Mikeal Hawthorne EMT/HBOT Previous Signature: 07/15/2019 5:36:16 PM Version By: Baruch Gouty RN, BSN Entered By: Mikeal Hawthorne on 07/16/2019 08:51:18 -------------------------------------------------------------------------------- Dexter Details Patient Name: Date of Service: [name], [name] (. 07/15/2019 10:30 AM Medical Record [id number] Patient Account Number: [id number] Date of Birth/Sex: Treating RN: [birth year] (85 y.o. [name] Primary Care Sylvio Weatherall: Simona Huh Other Clinician: Sandre Kitty Referring Roland Lipke: Treating Seri Kimmer/Extender:Robson, Vivi Barrack, Melvern Sample in Treatment: 0 Vital Signs Time Taken: 10:42 Temperature (F): 97.5 Height (in): 62 Pulse (bpm): 47 Source: Stated Respiratory Rate (breaths/min): 18 Weight (lbs): 105 Blood  Pressure (mmHg): 171/55 Source: Stated Reference Range: 80 - 120 mg / dl Body Mass Index (BMI): 19.2 Electronic Signature(s) Signed: 07/15/2019 5:36:16 PM By: Baruch Gouty RN, BSN Entered By: Baruch Gouty on 07/15/2019 10:44:05

## 2019-09-23 NOTE — Progress Notes (Signed)
[name], [name] ([id number]) Visit Report for 07/28/2019 Arrival Information Details Patient Name: Date of Service: [name], [name] 07/28/2019 3:00 PM Medical Record [id number] Patient Account Number: [id number] Date of Birth/Sex: Treating RN: June 23, 1933 (85 y.o. [name] Primary Care Bodey Frizell: Simona Huh Other Clinician: Referring Omesha Bowerman: Treating Adan Baehr/Extender:Robson, Vivi Barrack, Melvern Sample in Treatment: 1 Visit Information History Since Last Visit Added or deleted any medications: No Patient Arrived: Ambulatory Any new allergies or adverse reactions: No Arrival Time: 15:19 Had a fall or experienced change in No Accompanied By: self activities of daily living that may affect Transfer Assistance: None risk of falls: Patient Identification Verified: Yes Signs or symptoms of abuse/neglect since last No Secondary Verification Process Completed: Yes visito Patient Requires Transmission-Based No Hospitalized since last visit: No Precautions: Implantable device outside of the clinic excluding No Patient Has Alerts: No cellular tissue based products placed in the center since last visit: Has Dressing in Place as Prescribed: Yes Pain Present Now: No Electronic Signature(s) Signed: 09/23/2019 3:02:15 PM By: Sandre Kitty Entered By: Sandre Kitty on 07/28/2019 15:21:44 -------------------------------------------------------------------------------- Clinic Level of Care Assessment Details Patient Name: Date of Service: [name], [name] 07/28/2019 3:00 PM Medical Record [id number] Patient Account Number: [id number] Date of Birth/Sex: Treating RN: October 07, 1933 (85 y.o. [name] Primary Care Melissa Tomaselli: Simona Huh Other Clinician: Referring Aubrielle Stroud: Treating Erroll Wilbourne/Extender:Robson, Vivi Barrack, Melvern Sample in Treatment: 1 Clinic Level of Care Assessment Items TOOL 4 Quantity Score X - Use when only an EandM  is performed on FOLLOW-UP visit 1 0 ASSESSMENTS - Nursing Assessment / Reassessment X - Reassessment of Co-morbidities (includes updates in patient status) 1 10 X - Reassessment of Adherence to Treatment Plan 1 5 ASSESSMENTS - Wound and Skin Assessment / Reassessment X - Simple Wound Assessment / Reassessment - one wound 1 5 []  - Complex Wound Assessment / Reassessment - multiple wounds 0 []  - Dermatologic / Skin Assessment (not related to wound area) 0 ASSESSMENTS - Focused Assessment X - Circumferential Edema Measurements - multi extremities 1 5 []  - Nutritional Assessment / Counseling / Intervention 0 []  - Lower Extremity Assessment (monofilament, tuning fork, pulses) 0 []  - Peripheral Arterial Disease Assessment (using hand held doppler) 0 ASSESSMENTS - Ostomy and/or Continence Assessment and Care []  - Incontinence Assessment and Management 0 []  - Ostomy Care Assessment and Management (repouching, etc.) 0 PROCESS - Coordination of Care X - Simple Patient / Family Education for ongoing care 1 15 []  - Complex (extensive) Patient / Family Education for ongoing care 0 X - Staff obtains Programmer, systems, Records, Test Results / Process Orders 1 10 []  - Staff telephones HHA, Nursing Homes / Clarify orders / etc 0 []  - Routine Transfer to another Facility (non-emergent condition) 0 []  - Routine Hospital Admission (non-emergent condition) 0 []  - New Admissions / Biomedical engineer / Ordering NPWT, Apligraf, etc. 0 []  - Emergency Hospital Admission (emergent condition) 0 []  - Simple Discharge Coordination 0 []  - Complex (extensive) Discharge Coordination 0 PROCESS - Special Needs []  - Pediatric / Minor Patient Management 0 []  - Isolation Patient Management 0 []  - Hearing / Language / Visual special needs 0 []  - Assessment of Community assistance (transportation, D/C planning, etc.) 0 []  - Additional assistance / Altered mentation 0 []  - Support Surface(s) Assessment (bed, cushion, seat,  etc.) 0 INTERVENTIONS - Wound Cleansing / Measurement X - Simple Wound Cleansing - one wound 1 5 []  - Complex Wound Cleansing - multiple wounds 0 X -  Wound Imaging (photographs - any number of wounds) 1 5 []  - Wound Tracing (instead of photographs) 0 X - Simple Wound Measurement - one wound 1 5 []  - Complex Wound Measurement - multiple wounds 0 INTERVENTIONS - Wound Dressings X - Small Wound Dressing one or multiple wounds 1 10 []  - Medium Wound Dressing one or multiple wounds 0 []  - Large Wound Dressing one or multiple wounds 0 X - Application of Medications - topical 1 5 []  - Application of Medications - injection 0 INTERVENTIONS - Miscellaneous []  - External ear exam 0 []  - Specimen Collection (cultures, biopsies, blood, body fluids, etc.) 0 []  - Specimen(s) / Culture(s) sent or taken to Lab for analysis 0 []  - Patient Transfer (multiple staff / Civil Service fast streamer / Similar devices) 0 []  - Simple Staple / Suture removal (25 or less) 0 []  - Complex Staple / Suture removal (26 or more) 0 []  - Hypo / Hyperglycemic Management (close monitor of Blood Glucose) 0 []  - Ankle / Brachial Index (ABI) - do not check if billed separately 0 X - Vital Signs 1 5 Has the patient been seen at the hospital within the last three years: Yes Total Score: 85 Level Of Care: New/Established - Level 3 Electronic Signature(s) Signed: 07/28/2019 5:18:03 PM By: [name] Entered By: [name] on 07/28/2019 16:51:25 -------------------------------------------------------------------------------- Encounter Discharge Information Details Patient Name: Date of Service: [name] Carpenter. 07/28/2019 3:00 PM Medical Record [id number] Patient Account Number: [id number] Date of Birth/Sex: Treating RN: 1932/10/17 (85 y.o. [name] Primary Care Martrice Apt: Simona Huh Other Clinician: Referring Telly Jawad: Treating Tyisha Cressy/Extender:Robson, Vivi Barrack, Melvern Sample in Treatment:  1 Encounter Discharge Information Items Discharge Condition: Stable Ambulatory Status: Ambulatory Discharge Destination: Home Transportation: Private Auto Accompanied By: self Schedule Follow-up Appointment: Yes Clinical Summary of Care: Patient Declined Electronic Signature(s) Signed: 07/28/2019 5:18:03 PM By: [name] Entered By: [name] on 07/28/2019 16:52:49 -------------------------------------------------------------------------------- Lower Extremity Assessment Details Patient Name: Date of Service: [name], [name] 07/28/2019 3:00 PM Medical Record [id number] Patient Account Number: [id number] Date of Birth/Sex: Treating RN: December 06, 1932 (85 y.o. [name] Primary Care Rashi Granier: Simona Huh Other Clinician: Referring Keldan Eplin: Treating Ezel Vallone/Extender:Robson, Vivi Barrack, Melvern Sample in Treatment: 1 Edema Assessment Assessed: [Left: Yes] [Right: No] Edema: [Left: N] [Right: o] Calf Left: Right: Point of Measurement: cm From Medial Instep 30 cm cm Ankle Left: Right: Point of Measurement: cm From Medial Instep 16 cm cm Vascular Assessment Pulses: Dorsalis Pedis Palpable: [Left:Yes] Electronic Signature(s) Signed: 07/28/2019 6:01:15 PM By: Deon Pilling Entered By: Deon Pilling on 07/28/2019 15:34:42 -------------------------------------------------------------------------------- Multi Wound Chart Details Patient Name: Date of Service: [name] Carpenter. 07/28/2019 3:00 PM Medical Record [id number] Patient Account Number: [id number] Date of Birth/Sex: Treating RN: 11/26/1932 (85 y.o. [name] Primary Care Favour Aleshire: Simona Huh Other Clinician: Referring Keymari Sato: Treating Teryn Boerema/Extender:Robson, Vivi Barrack, Melvern Sample in Treatment: 1 Vital Signs Height(in): 62 Pulse(bpm): 53 Weight(lbs): 105 Blood Pressure(mmHg): 149/44 Body Mass Index(BMI): 19 Temperature(F): 98.6 Respiratory  18 Rate(breaths/min): Photos: [4:No Photos] [N/A:N/A] Wound Location: [4:Left Lower Leg - Lateral] [N/A:N/A] Wounding Event: [4:Trauma] [N/A:N/A] Primary Etiology: [4:Venous Leg Ulcer] [N/A:N/A] Comorbid History: [4:Cataracts, Hypertension, N/A Raynauds, Osteoarthritis] Date Acquired: [4:07/01/2019] [N/A:N/A] Weeks of Treatment: [4:1] [N/A:N/A] Wound Status: [4:Open] [N/A:N/A] Clustered Wound: [4:Yes] [N/A:N/A] Clustered Quantity: [4:4] [N/A:N/A] Measurements L x W x D 4.5x1.8x0.1 [N/A:N/A] (cm) Area (cm) : [4:6.362] [N/A:N/A] Volume (cm) : [4:0.636] [N/A:N/A] % Reduction in Area: [4:-15.70%] [N/A:N/A] % Reduction in Volume: -15.60% [  N/A:N/A] Classification: [4:Full Thickness Without Exposed Support Structures] [N/A:N/A] Exudate Amount: [4:Medium] [N/A:N/A] Exudate Type: [4:Serosanguineous] [N/A:N/A] Exudate Color: [4:red, brown] [N/A:N/A] Wound Margin: [4:Flat and Intact] [N/A:N/A] Granulation Amount: [4:Large (67-100%)] [N/A:N/A] Granulation Quality: [4:Red, Hyper-granulation] [N/A:N/A] Necrotic Amount: [4:None Present (0%)] [N/A:N/A] Exposed Structures: [4:Fat Layer (Subcutaneous N/A Tissue) Exposed: Yes Fascia: No Tendon: No Muscle: No Joint: No Bone: No Medium (34-66%)] [N/A:N/A] Treatment Notes Wound #4 (Left, Lateral Lower Leg) 1. Cleanse With Wound Cleanser 3. Primary Dressing Applied Hydrofera Blue 4. Secondary Dressing Dry Gauze Roll Gauze 5. Secured With Recruitment consultant) Signed: 07/28/2019 5:50:55 PM By: Linton Ham MD Signed: 07/29/2019 5:52:09 PM By: Levan Hurst RN, BSN Entered By: Linton Ham on 07/28/2019 17:31:48 -------------------------------------------------------------------------------- Multi-Disciplinary Care Plan Details Patient Name: Date of Service: [name], [name] 07/28/2019 3:00 PM Medical Record [id number] Patient Account Number: [id number] Date of Birth/Sex: Treating RN: Jan 23, 1933 (85 y.o. [name] Primary Care Omaree Fuqua: Simona Huh Other Clinician: Referring Esgar Barnick: Treating Mishael Haran/Extender:Robson, Vivi Barrack, Melvern Sample in Treatment: 1 Active Inactive Wound/Skin Impairment Nursing Diagnoses: Knowledge deficit related to ulceration/compromised skin integrity Goals: Patient/caregiver will verbalize understanding of skin care regimen Date Initiated: 07/15/2019 Target Resolution Date: 08/08/2019 Goal Status: Active Ulcer/skin breakdown will have a volume reduction of 30% by week 4 Date Initiated: 07/15/2019 Target Resolution Date: 08/08/2019 Goal Status: Active Interventions: Assess patient/caregiver ability to obtain necessary supplies Assess patient/caregiver ability to perform ulcer/skin care regimen upon admission and as needed Assess ulceration(s) every visit Notes: Electronic Signature(s) Signed: 07/28/2019 5:18:03 PM By: [name] Entered By: [name] on 07/28/2019 16:49:11 -------------------------------------------------------------------------------- Pain Assessment Details Patient Name: Date of Service: [name], [name] 07/28/2019 3:00 PM Medical Record [id number] Patient Account Number: [id number] Date of Birth/Sex: Treating RN: 1932-10-24 (85 y.o. [name] Primary Care Margarie Mcguirt: Simona Huh Other Clinician: Referring Stefany Starace: Treating Zhoey Blackstock/Extender:Robson, Vivi Barrack, Melvern Sample in Treatment: 1 Active Problems Location of Pain Severity and Description of Pain Patient Has Paino No Site Locations Pain Management and Medication Current Pain Management: Electronic Signature(s) Signed: 07/29/2019 5:52:09 PM By: Levan Hurst RN, BSN Signed: 09/23/2019 3:02:15 PM By: Sandre Kitty Entered By: Sandre Kitty on 07/28/2019 15:24:18 -------------------------------------------------------------------------------- Patient/Caregiver Education Details Patient Name: Date of  Service: [name] Carpenter 10/12/2020andnbsp3:00 PM Medical Record [id number] Patient Account Number: [id number] Date of Birth/Gender: 1933-03-22 (85 y.o. F) Treating RN: [name] Primary Care Physician: Simona Huh Other Clinician: Referring Physician: Treating Physician/Extender:Robson, Vivi Barrack, Melvern Sample in Treatment: 1 Education Assessment Education Provided To: Patient Education Topics Provided Wound/Skin Impairment: Methods: Explain/Verbal Responses: State content correctly Electronic Signature(s) Signed: 07/28/2019 5:18:03 PM By: [name] Entered By: [name] on 07/28/2019 16:49:26 -------------------------------------------------------------------------------- Wound Assessment Details Patient Name: Date of Service: [name], [name] 07/28/2019 3:00 PM Medical Record [id number] Patient Account Number: [id number] Date of Birth/Sex: Treating RN: 18-Nov-1932 (85 y.o. [name] Primary Care Mutasim Tuckey: Simona Huh Other Clinician: Referring Latresa Gasser: Treating Zharia Conrow/Extender:Robson, Vivi Barrack, Melvern Sample in Treatment: 1 Wound Status Wound Number: 4 Primary Venous Leg Ulcer Etiology: Wound Location: Left Lower Leg - Lateral Wound Open Wounding Event: Trauma Status: Date Acquired: 07/01/2019 Comorbid Cataracts, Hypertension, Raynauds, Weeks Of Treatment: 1 History: Osteoarthritis Clustered Wound: Yes Photos Wound Measurements Length: (cm) 4.5 % Reduct Width: (cm) 1.8 % Reduct Depth: (cm) 0.1 Epitheli Clustered Quantity: 4 Tunnelin Area: (cm) 6.362 Undermi Volume: (cm) 0.636 Wound Description Full Thickness Without Exposed Support Foul Od Classification: Structures Slough/ Wound Flat and Intact Margin: Exudate Medium Amount:  Exudate Serosanguineous Type: Exudate red, brown Color: Wound Bed Granulation Amount: Large (67-100%) Granulation Quality: Red, Hyper-granulation  Fascia Necrotic Amount: None Present (0%) Fat Lay Tendon Muscle Joint E Bone Ex or After Cleansing: No Fibrino No Exposed Structure Exposed: No er (Subcutaneous Tissue) Exposed: Yes Exposed: No Exposed: No xposed: No posed: No ion in Area: -15.7% ion in Volume: -15.6% alization: Medium (34-66%) g: No ning: No Electronic Signature(s) Signed: 07/29/2019 5:52:09 PM By: Levan Hurst RN, BSN Signed: 08/20/2019 4:01:42 PM By: Mikeal Hawthorne EMT/HBOT Previous Signature: 07/28/2019 6:01:15 PM Version By: Deon Pilling Entered By: Mikeal Hawthorne on 07/29/2019 11:09:08 -------------------------------------------------------------------------------- Vitals Details Patient Name: Date of Service: [name] Carpenter. 07/28/2019 3:00 PM Medical Record [id number] Patient Account Number: [id number] Date of Birth/Sex: Treating RN: 12-May-1933 (85 y.o. [name] Primary Care Cyncere Ruhe: Simona Huh Other Clinician: Referring Gali Spinney: Treating Gazelle Towe/Extender:Robson, Vivi Barrack, Melvern Sample in Treatment: 1 Vital Signs Time Taken: 15:22 Temperature (F): 98.6 Height (in): 62 Pulse (bpm): 53 Weight (lbs): 105 Respiratory Rate (breaths/min): 18 Body Mass Index (BMI): 19.2 Blood Pressure (mmHg): 149/44 Reference Range: 80 - 120 mg / dl Electronic Signature(s) Signed: 09/23/2019 3:02:15 PM By: Sandre Kitty Entered By: Sandre Kitty on 07/28/2019 15:24:13

## 2019-09-23 NOTE — Progress Notes (Signed)
[name], [name] ([id number]) Visit Report for 07/15/2019 HPI Details Patient Name: Date of Service: [name], [name] (. 07/15/2019 10:30 AM Medical Record [id number] Patient Account Number: [id number] Date of Birth/Sex: Treating RN: [birth year] (85 y.o. F) Primary Care Provider: Simona Huh Other Clinician: Sandre Kitty Referring Provider: Treating Provider/Extender:Traivon Morrical, Vivi Barrack, Melvern Sample in Treatment: 0 History of Present Illness Location: left lower extremity on the anterior shin Quality: Patient reports experiencing a dull pain to affected area(s). Severity: Patient states wound(s) are getting worse. Duration: Patient has had the wound for > 2 months prior to seeking treatment at the wound center Timing: Pain in wound is Intermittent (comes and goes Context: The wound occurred when the patient had a blunt injury against the tub Modifying Factors: Patient wound(s)/ulcer(s) are worsening due to :initially it was getting better and then started breaking open and draining Associated Signs and Symptoms: Patient reports having increase discharge. HPI Description: Thispleasant 85 year old who looks much younger than her stated age had a blunt injury to her left leg and has had an open wound which was taken care of at the urgent care. Was better initially but then started getting worse when she started applying Vaseline over this. no significant past medical history. ====== Old notes this patient suffered a traumatic laceration on her left leg 16 days ago against her bathtub. She was seen in urgent care and it was felt that her skin was too fragile to sutures so it was Steri-Stripped. Some 10 days later the Steri- Strips were removed. The wounds wound became what sounds to be infected with swelling and pain. She was put on anti-biotics. She is here for our review of this laceration.she is not a diabetic she does not have prior wound history. She is active walking up  to 5 miles per day without difficulty. 04/16/15; the wound continues to be stable. She has undermining on its medial aspects. Her is no evidence of infection or ischemia. She does have stasis physiology. 04/21/2015 -- comes in earlier because of increasing pain over the left lower extremity ulceration and increasing discharge possibly purulent. The nurse was concerned enough to make it a doctor visit. No fever. 04/30/15 she comes in with a new traumatic wound [patio wall] area on her upper left leg. The original wound appears to be healthier. 05/07/15; the patient fell on the new traumatic area on her upper left leg. The original wound continues to be healthier. 06/04/15; both of the wound areas appear much better than the last time I saw these. Only a small open area remains of her original wound and the subsequent wound on the upper leg. There is no evidence of infection. both of these wounds are traumatic, 06/18/15 considerable improvement since I last saw these areas. No evidence of infection 06/25/15; the superior traumatic wound has resolved. The original inferior wound has only a small open area remaining, certainly no better on the silver alginate versus last week 07/09/15; all the wound areas have resolved. She has underlining venous stasis although both of these wound areas were trauma. No additional measures unless she returns ======== READMISSION 07/15/2019 Patient is now an 85 year old woman who was previously here in 2016 and 2018. Cared for at the time by Dr. Con Memos. Both times with wounds related to trauma in the left leg. She was felt to have underlying venous insufficiency although both occasions were related to trauma. The patient tells me that 2 weeks ago she hit her lateral left leg on the car  door. This was a skin tear with a flap for a period of time she has been using Polysporin. The flap came off recently she has a clean looking superficial wound on the left lateral leg. Our  intake nurse reported some drainage. Past medical history; includes sensorineural hearing loss, osteoarthritis, hypertension and a hammertoe on the right foot for which she follows with podiatry. ABIs in our clinic were 1.19 on the left Electronic Signature(s) Signed: 07/15/2019 5:43:25 PM By: Linton Ham MD Entered By: Linton Ham on 07/15/2019 12:06:22 -------------------------------------------------------------------------------- Physical Exam Details Patient Name: Date of Service: [name], [name] (. 07/15/2019 10:30 AM Medical Record [id number] Patient Account Number: [id number] Date of Birth/Sex: Treating RN: Sep 24, 1933 (85 y.o. F) Primary Care Provider: Simona Huh Other Clinician: Sandre Kitty Referring Provider: Treating Provider/Extender:Signe Tackitt, Vivi Barrack, Melvern Sample in Treatment: 0 Constitutional Patient is hypertensive.. Pulse regular and within target range for patient.Marland Kitchen Respirations regular, non-labored and within target range.. Temperature is normal and within the target range for the patient.Marland Kitchen Appears in no distress. Eyes Conjunctivae clear. No discharge.no icterus. Respiratory work of breathing is normal. Cardiovascular Heart rhythm and rate regular, without murmur or gallop.. Palpable at the popliteal. Pedal pulses palpable at the left dorsalis pedis and posterior tibial. Lymphatic None palpable in the popliteal area. Integumentary (Hair, Skin) Patient notes easily changing skin colors in her hands and feet with temperature changes. Self diagnosed as Raynaud's. Psychiatric appears at normal baseline. Notes Wound exam; clean surface wound on the left lateral calf. If she had a skin flap that is been removed. Surface area looks healthy no debridement is required. No evidence of surrounding infection Electronic Signature(s) Signed: 07/15/2019 5:43:25 PM By: Linton Ham MD Entered By: Linton Ham on 07/15/2019  12:09:03 -------------------------------------------------------------------------------- Physician Orders Details Patient Name: Date of Service: [name], [name] (. 07/15/2019 10:30 AM Medical Record [id number] Patient Account Number: [id number] Date of Birth/Sex: Treating RN: [birth year] (85 y.o. Orvan Falconer Primary Care Provider: Simona Huh Other Clinician: Sandre Kitty Referring Provider: Treating Provider/Extender:Quan Cybulski, Vivi Barrack, Melvern Sample in Treatment: 0 Verbal / Phone Orders: No Diagnosis Coding Follow-up Appointments Return Appointment in 1 week. Wound Cleansing May shower and wash wound with soap and water. Primary Wound Dressing Hydrofera Blue - READY Secondary Dressing Dry Gauze Edema Control Kerlix and Coban - Left Lower Extremity Electronic Signature(s) Signed: 07/15/2019 5:43:25 PM By: Linton Ham MD Signed: 09/23/2019 2:58:33 PM By: Carlene Coria RN Entered By: Carlene Coria on 07/15/2019 11:23:02 -------------------------------------------------------------------------------- Problem List Details Patient Name: Date of Service: [name], [name] (. 07/15/2019 10:30 AM Medical Record [id number] Patient Account Number: [id number] Date of Birth/Sex: Treating RN: 08/17/1933 (85 y.o. F) Primary Care Provider: Simona Huh Other Clinician: Sandre Kitty Referring Provider: Treating Provider/Extender:Harlin Mazzoni, Vivi Barrack, Melvern Sample in Treatment: 0 Active Problems ICD-10 Evaluated Encounter Code Description Active Date Today Diagnosis S81.812D Laceration without foreign body, left lower leg, 07/15/2019 No Yes subsequent encounter I87.301 Chronic venous hypertension (idiopathic) without [id number] No Yes complications of right lower extremity Inactive Problems Resolved Problems Electronic Signature(s) Signed: 07/15/2019 5:43:25 PM By: Linton Ham MD Entered By: Linton Ham on 07/15/2019  11:58:24 -------------------------------------------------------------------------------- Progress Note Details Patient Name: Date of Service: [name], [name] (. 07/15/2019 10:30 AM Medical Record [id number] Patient Account Number: [id number] Date of Birth/Sex: Treating RN: 1933-09-22 (85 y.o. F) Primary Care Provider: Simona Huh Other Clinician: Sandre Kitty Referring Provider: Treating Provider/Extender:Eldrick Penick, Vivi Barrack, Melvern Sample in Treatment: 0 Subjective History of Present Illness (HPI) The following HPI  elements were documented for the patient's wound: Location: left lower extremity on the anterior shin Quality: Patient reports experiencing a dull pain to affected area(s). Severity: Patient states wound(s) are getting worse. Duration: Patient has had the wound for > 2 months prior to seeking treatment at the wound center Timing: Pain in wound is Intermittent (comes and goes Context: The wound occurred when the patient had a blunt injury against the tub Modifying Factors: Patient wound(s)/ulcer(s) are worsening due to :initially it was getting better and then started breaking open and draining Associated Signs and Symptoms: Patient reports having increase discharge. Thispleasant 85 year old who looks much younger than her stated age had a blunt injury to her left leg and has had an open wound which was taken care of at the urgent care. Was better initially but then started getting worse when she started applying Vaseline over this. no significant past medical history. ====== Old notes this patient suffered a traumatic laceration on her left leg 16 days ago against her bathtub. She was seen in urgent care and it was felt that her skin was too fragile to sutures so it was Steri-Stripped. Some 10 days later the Steri- Strips were removed. The wounds wound became what sounds to be infected with swelling and pain. She was put on anti-biotics. She is here for  our review of this laceration.she is not a diabetic she does not have prior wound history. She is active walking up to 5 miles per day without difficulty. 04/16/15; the wound continues to be stable. She has undermining on its medial aspects. Her is no evidence of infection or ischemia. She does have stasis physiology. 04/21/2015 -- comes in earlier because of increasing pain over the left lower extremity ulceration and increasing discharge possibly purulent. The nurse was concerned enough to make it a doctor visit. No fever. 04/30/15 she comes in with a new traumatic wound [patio wall] area on her upper left leg. The original wound appears to be healthier. 05/07/15; the patient fell on the new traumatic area on her upper left leg. The original wound continues to be healthier. 06/04/15; both of the wound areas appear much better than the last time I saw these. Only a small open area remains of her original wound and the subsequent wound on the upper leg. There is no evidence of infection. both of these wounds are traumatic, 06/18/15 considerable improvement since I last saw these areas. No evidence of infection 06/25/15; the superior traumatic wound has resolved. The original inferior wound has only a small open area remaining, certainly no better on the silver alginate versus last week 07/09/15; all the wound areas have resolved. She has underlining venous stasis although both of these wound areas were trauma. No additional measures unless she returns ======== READMISSION 07/15/2019 Patient is now an 85 year old woman who was previously here in 2016 and 2018. Cared for at the time by Dr. Con Memos. Both times with wounds related to trauma in the left leg. She was felt to have underlying venous insufficiency although both occasions were related to trauma. The patient tells me that 2 weeks ago she hit her lateral left leg on the car door. This was a skin tear with a flap for a period of time she has been  using Polysporin. The flap came off recently she has a clean looking superficial wound on the left lateral leg. Our intake nurse reported some drainage. Past medical history; includes sensorineural hearing loss, osteoarthritis, hypertension and a hammertoe on the right  foot for which she follows with podiatry. ABIs in our clinic were 1.19 on the left Patient History Information obtained from Patient. Allergies No Known Allergies Family History Cancer - Father, No family history of Diabetes, Heart Disease, Hereditary Spherocytosis, Hypertension, Kidney Disease, Lung Disease, Seizures, Stroke, Thyroid Problems, Tuberculosis. Social History Former smoker - quit 25 yr ago, Marital Status - Married, Alcohol Use - Daily - vodka, wine, Drug Use - No History, Caffeine Use - Daily - tea. Medical History Eyes Patient has history of Cataracts - bil removed Denies history of Glaucoma, Optic Neuritis Ear/Nose/Mouth/Throat Denies history of Chronic sinus problems/congestion, Middle ear problems Hematologic/Lymphatic Denies history of Anemia, Hemophilia, Human Immunodeficiency Virus, Lymphedema, Sickle Cell Disease Respiratory Denies history of Aspiration, Asthma, Chronic Obstructive Pulmonary Disease (COPD), Pneumothorax, Sleep Apnea, Tuberculosis Cardiovascular Patient has history of Hypertension Denies history of Angina, Arrhythmia, Congestive Heart Failure, Coronary Artery Disease, Deep Vein Thrombosis, Hypotension, Myocardial Infarction, Peripheral Arterial Disease, Peripheral Venous Disease, Phlebitis, Vasculitis Gastrointestinal Denies history of Cirrhosis , Colitis, Crohnoos, Hepatitis A, Hepatitis B, Hepatitis C Endocrine Denies history of Type I Diabetes, Type II Diabetes Genitourinary Denies history of End Stage Renal Disease Immunological Patient has history of Raynaudoos Denies history of Lupus Erythematosus, Scleroderma Integumentary (Skin) Denies history of History of  Burn Musculoskeletal Patient has history of Osteoarthritis Denies history of Gout, Rheumatoid Arthritis, Osteomyelitis Neurologic Denies history of Dementia, Neuropathy, Quadriplegia, Paraplegia, Seizure Disorder Oncologic Denies history of Received Chemotherapy, Received Radiation Psychiatric Denies history of Anorexia/bulimia, Confinement Anxiety Hospitalization/Surgery History - lumbar laminectomy. - facial cosmetic surgery. - titanium rod placed right index finger. - tonsillectomy. - tubal ligation. - vaginal hysterectomy. - bil cataract. - appendectomy. - left shoulder replacement. Medical And Surgical History Notes Gastrointestinal GERD Immunological reactive arthritis Musculoskeletal reactive arthritis Review of Systems (ROS) Constitutional Symptoms (General Health) Denies complaints or symptoms of Fatigue, Fever, Chills, Marked Weight Change. Ear/Nose/Mouth/Throat Denies complaints or symptoms of Chronic sinus problems or rhinitis. Respiratory Denies complaints or symptoms of Chronic or frequent coughs, Shortness of Breath. Cardiovascular Denies complaints or symptoms of Chest pain. Gastrointestinal Denies complaints or symptoms of Frequent diarrhea, Nausea, Vomiting. Endocrine Denies complaints or symptoms of Heat/cold intolerance. Genitourinary Denies complaints or symptoms of Frequent urination. Integumentary (Skin) Complains or has symptoms of Wounds - left leg. Musculoskeletal Denies complaints or symptoms of Muscle Pain, Muscle Weakness. Neurologic Denies complaints or symptoms of Numbness/parasthesias. Psychiatric Denies complaints or symptoms of Claustrophobia, Suicidal. Objective Constitutional Patient is hypertensive.. Pulse regular and within target range for patient.Marland Kitchen Respirations regular, non-labored and within target range.. Temperature is normal and within the target range for the patient.Marland Kitchen Appears in no distress. Vitals Time Taken: 10:42 AM,  Height: 62 in, Source: Stated, Weight: 105 lbs, Source: Stated, BMI: 19.2, Temperature: 97.5 F, Pulse: 47 bpm, Respiratory Rate: 18 breaths/min, Blood Pressure: 171/55 mmHg. Eyes Conjunctivae clear. No discharge.no icterus. Respiratory work of breathing is normal. Cardiovascular Heart rhythm and rate regular, without murmur or gallop.. Palpable at the popliteal. Pedal pulses palpable at the left dorsalis pedis and posterior tibial. Lymphatic None palpable in the popliteal area. Psychiatric appears at normal baseline. General Notes: Wound exam; clean surface wound on the left lateral calf. If she had a skin flap that is been removed. Surface area looks healthy no debridement is required. No evidence of surrounding infection Integumentary (Hair, Skin) Patient notes easily changing skin colors in her hands and feet with temperature changes. Self diagnosed as Raynaud's. Wound #4 status is Open. Original cause of wound was Trauma.  The wound is located on the Left,Lateral Lower Leg. The wound measures 3.5cm length x 2cm width x 0.1cm depth; 5.498cm^2 area and 0.55cm^3 volume. There is Fat Layer (Subcutaneous Tissue) Exposed exposed. There is no tunneling or undermining noted. There is a medium amount of serosanguineous drainage noted. The wound margin is flat and intact. There is large (67-100%) red, hyper - granulation within the wound bed. There is no necrotic tissue within the wound bed. Assessment Active Problems ICD-10 Laceration without foreign body, left lower leg, subsequent encounter Chronic venous hypertension (idiopathic) without complications of right lower extremity Plan Follow-up Appointments: Return Appointment in 1 week. Wound Cleansing: May shower and wash wound with soap and water. Primary Wound Dressing: Hydrofera Blue - READY Secondary Dressing: Dry Gauze Edema Control: Kerlix and Coban - Left Lower Extremity 1. Hydrofera Blue ready under Kerlix Coban 2. I had  some thoughts about using the Hydrofera Blue under border foam however she does have venous hypertension and this should help with this. 3. No evidence of infection Electronic Signature(s) Signed: 07/15/2019 5:43:25 PM By: Linton Ham MD Entered By: Linton Ham on 07/15/2019 12:10:13 -------------------------------------------------------------------------------- HxROS Details Patient Name: Date of Service: [name], [name] (. 07/15/2019 10:30 AM Medical Record [id number] Patient Account Number: [id number] Date of Birth/Sex: Treating RN: Jan 18, 1933 (85 y.o. [name] Primary Care Provider: Simona Huh Other Clinician: Sandre Kitty Referring Provider: Treating Provider/Extender:Cairo Lingenfelter, Vivi Barrack, Melvern Sample in Treatment: 0 Information Obtained From Patient Constitutional Symptoms (General Health) Complaints and Symptoms: Negative for: Fatigue; Fever; Chills; Marked Weight Change Ear/Nose/Mouth/Throat Complaints and Symptoms: Negative for: Chronic sinus problems or rhinitis Medical History: Negative for: Chronic sinus problems/congestion; Middle ear problems Respiratory Complaints and Symptoms: Negative for: Chronic or frequent coughs; Shortness of Breath Medical History: Negative for: Aspiration; Asthma; Chronic Obstructive Pulmonary Disease (COPD); Pneumothorax; Sleep Apnea; Tuberculosis Cardiovascular Complaints and Symptoms: Negative for: Chest pain Medical History: Positive for: Hypertension Negative for: Angina; Arrhythmia; Congestive Heart Failure; Coronary Artery Disease; Deep Vein Thrombosis; Hypotension; Myocardial Infarction; Peripheral Arterial Disease; Peripheral Venous Disease; Phlebitis; Vasculitis Gastrointestinal Complaints and Symptoms: Negative for: Frequent diarrhea; Nausea; Vomiting Medical History: Negative for: Cirrhosis ; Colitis; Crohns; Hepatitis A; Hepatitis B; Hepatitis C Past Medical History  Notes: GERD Endocrine Complaints and Symptoms: Negative for: Heat/cold intolerance Medical History: Negative for: Type I Diabetes; Type II Diabetes Genitourinary Complaints and Symptoms: Negative for: Frequent urination Medical History: Negative for: End Stage Renal Disease Integumentary (Skin) Complaints and Symptoms: Positive for: Wounds - left leg Medical History: Negative for: History of Burn Musculoskeletal Complaints and Symptoms: Negative for: Muscle Pain; Muscle Weakness Medical History: Positive for: Osteoarthritis Negative for: Gout; Rheumatoid Arthritis; Osteomyelitis Past Medical History Notes: reactive arthritis Neurologic Complaints and Symptoms: Negative for: Numbness/parasthesias Medical History: Negative for: Dementia; Neuropathy; Quadriplegia; Paraplegia; Seizure Disorder Psychiatric Complaints and Symptoms: Negative for: Claustrophobia; Suicidal Medical History: Negative for: Anorexia/bulimia; Confinement Anxiety Eyes Medical History: Positive for: Cataracts - bil removed Negative for: Glaucoma; Optic Neuritis Hematologic/Lymphatic Medical History: Negative for: Anemia; Hemophilia; Human Immunodeficiency Virus; Lymphedema; Sickle Cell Disease Immunological Medical History: Positive for: Raynauds Negative for: Lupus Erythematosus; Scleroderma Past Medical History Notes: reactive arthritis Oncologic Medical History: Negative for: Received Chemotherapy; Received Radiation HBO Extended History Items Eyes: Cataracts Immunizations Pneumococcal Vaccine: Received Pneumococcal Vaccination: Yes Implantable Devices Yes Hospitalization / Surgery History Type of Hospitalization/Surgery lumbar laminectomy facial cosmetic surgery titanium rod placed right index finger tonsillectomy tubal ligation vaginal hysterectomy bil cataract appendectomy left shoulder replacement Family and Social History Cancer: Yes -  Father; Diabetes: No; Heart  Disease: No; Hereditary Spherocytosis: No; Hypertension: No; Kidney Disease: No; Lung Disease: No; Seizures: No; Stroke: No; Thyroid Problems: No; Tuberculosis: No; Former smoker - quit 25 yr ago; Marital Status - Married; Alcohol Use: Daily - vodka, wine; Drug Use: No History; Caffeine Use: Daily - tea; Financial Concerns: No; Food, Clothing or Shelter Needs: No; Support System Lacking: No; Transportation Concerns: No Engineer, maintenance) Signed: 07/15/2019 5:36:16 PM By: Baruch Gouty RN, BSN Signed: 07/15/2019 5:43:25 PM By: Linton Ham MD Entered By: Baruch Gouty on 07/15/2019 10:48:59 -------------------------------------------------------------------------------- SuperBill Details Patient Name: Date of Service: [name], [name] (. 07/15/2019 Medical Record [id number] Patient Account Number: [id number] Date of Birth/Sex: Treating RN: [birth year] (85 y.o. F) Primary Care Provider: Simona Huh Other Clinician: Sandre Kitty Referring Provider: Treating Provider/Extender:Errica Dutil, Vivi Barrack, Melvern Sample in Treatment: 0 Diagnosis Coding ICD-10 Codes Code Description [phone] Laceration without foreign body, left lower leg, subsequent encounter I87.301 Chronic venous hypertension (idiopathic) without complications of right lower extremity Facility Procedures CPT4 Code: [id number] Description: [id number] - WOUND CARE VISIT-LEV 4 EST PT Modifier: Quantity: 1 Physician Procedures CPT4 Code Description: [id number] - WC PHYS LEVEL 3 - EST PT ICD-10 Diagnosis Description S81.812D Laceration without foreign body, left lower leg, subsequent I87.301 Chronic venous hypertension (idiopathic) without complicati extremity Modifier: encounter ons of right lo Quantity: 1 wer Electronic Signature(s) Signed: 07/15/2019 5:43:25 PM By: Linton Ham MD Signed: 09/23/2019 2:58:33 PM By: Carlene Coria RN Entered By: Carlene Coria on 07/15/2019 14:27:24

## 2019-10-15 ENCOUNTER — Ambulatory Visit: Payer: PPO | Attending: Internal Medicine

## 2019-10-15 DIAGNOSIS — Z20822 Contact with and (suspected) exposure to covid-19: Secondary | ICD-10-CM

## 2019-10-15 DIAGNOSIS — Z20828 Contact with and (suspected) exposure to other viral communicable diseases: Secondary | ICD-10-CM | POA: Diagnosis not present

## 2019-10-16 LAB — NOVEL CORONAVIRUS, NAA: SARS-CoV-2, NAA: NOT DETECTED

## 2019-10-22 DIAGNOSIS — Z96612 Presence of left artificial shoulder joint: Secondary | ICD-10-CM | POA: Diagnosis not present

## 2019-10-29 ENCOUNTER — Encounter: Payer: Self-pay | Admitting: Certified Nurse Midwife

## 2019-10-29 DIAGNOSIS — Z1231 Encounter for screening mammogram for malignant neoplasm of breast: Secondary | ICD-10-CM | POA: Diagnosis not present

## 2019-10-30 ENCOUNTER — Ambulatory Visit: Payer: Medicare Other | Attending: Internal Medicine

## 2019-10-30 DIAGNOSIS — Z23 Encounter for immunization: Secondary | ICD-10-CM | POA: Insufficient documentation

## 2019-10-30 NOTE — Progress Notes (Signed)
   Covid-19 Vaccination Clinic  Name:  INARI REHAK    MRN: [id number] DOB: Jul 07, 1933  10/30/2019  Ms. Lindwall was observed post Covid-19 immunization for 15 minutes without incidence. She was provided with Vaccine Information Sheet and instruction to access the V-Safe system.   Ms. Paleo was instructed to call 911 with any severe reactions post vaccine: Marland Kitchen Difficulty breathing  . Swelling of your face and throat  . A fast heartbeat  . A bad rash all over your body  . Dizziness and weakness    Immunizations Administered    Name Date Dose VIS Date Route   Pfizer COVID-19 Vaccine 10/30/2019  8:52 AM 0.3 mL 09/26/2019 Intramuscular   Manufacturer: [place]   Lot: [id number]   [place]: [id number]

## 2019-11-03 ENCOUNTER — Other Ambulatory Visit: Payer: Self-pay | Admitting: Podiatry

## 2019-11-03 ENCOUNTER — Encounter: Payer: Self-pay | Admitting: Podiatry

## 2019-11-03 ENCOUNTER — Ambulatory Visit (INDEPENDENT_AMBULATORY_CARE_PROVIDER_SITE_OTHER): Payer: PPO

## 2019-11-03 ENCOUNTER — Other Ambulatory Visit: Payer: Self-pay

## 2019-11-03 ENCOUNTER — Ambulatory Visit: Payer: PPO | Admitting: Podiatry

## 2019-11-03 VITALS — Temp 96.5°F

## 2019-11-03 DIAGNOSIS — M779 Enthesopathy, unspecified: Secondary | ICD-10-CM | POA: Diagnosis not present

## 2019-11-03 DIAGNOSIS — M2041 Other hammer toe(s) (acquired), right foot: Secondary | ICD-10-CM

## 2019-11-03 DIAGNOSIS — M79671 Pain in right foot: Secondary | ICD-10-CM

## 2019-11-11 NOTE — Progress Notes (Signed)
Subjective:   Patient ID: [name], female   DOB: 86 y.o.   MRN: [id number]   HPI Patient states overall she is doing well but she is getting irritation of her second toe between her big toe and third toe.  States she is not getting any breakdown of tissue no drainage or pain but was just concerned about her clearance and some swelling   ROS      Objective:  Physical Exam  Neurovascular status intact negative [name]' sign noted wound edges well coapted second digit with mild swelling still noted to the toe and mild lateral displacement distal joint as we did have to remove bone secondary to where she had a chronic ulceration preoperatively.  It is mildly tender with no breakdown of tissue     Assessment:  Position of the toe which may be creating some irritation or still dealing with mild to moderate postoperative swelling from the healing process     Plan:  H&P x-ray reviewed and advised on padding which was dispensed today along with wider shoes and patient will be seen back for [place] to recheck.  I do think this will be uneventful but patient is encouraged to call with questions concerns  X-rays indicate that the removal of bone second digit right is satisfactory with no current pathology noted

## 2019-11-12 ENCOUNTER — Other Ambulatory Visit: Payer: Self-pay | Admitting: Certified Nurse Midwife

## 2019-11-13 NOTE — Telephone Encounter (Signed)
Medication refill request: Estradiol patch #8, R4 Last AEX:  08-20-19 Next AEX: 08-23-20 Last MMG (if hormonal medication request): 10-29-19 Neg/BiRads1(needs to be scanned into Epic from [place]) Refill authorized: please authorize if appropriate

## 2019-11-18 ENCOUNTER — Ambulatory Visit: Payer: PPO | Attending: Internal Medicine

## 2019-11-18 DIAGNOSIS — Z23 Encounter for immunization: Secondary | ICD-10-CM

## 2019-11-18 NOTE — Progress Notes (Signed)
   Covid-19 Vaccination Clinic  Name:  [name]    MRN: [id number] DOB: 15-Aug-1933  11/18/2019  [name] was observed post Covid-19 immunization for 15 minutes without incidence. She was provided with Vaccine Information Sheet and instruction to access the V-Safe system.   [name] was instructed to call 911 with any severe reactions post vaccine: Marland Kitchen Difficulty breathing  . Swelling of your face and throat  . A fast heartbeat  . A bad rash all over your body  . Dizziness and weakness    Immunizations Administered    Name Date Dose VIS Date Route   Pfizer COVID-19 Vaccine 11/18/2019 10:46 AM 0.3 mL 09/26/2019 Intramuscular   Manufacturer: [place]   Lot: [id number]   [place]: [id number]

## 2019-11-19 DIAGNOSIS — M154 Erosive (osteo)arthritis: Secondary | ICD-10-CM | POA: Diagnosis not present

## 2019-11-19 DIAGNOSIS — Z1589 Genetic susceptibility to other disease: Secondary | ICD-10-CM | POA: Diagnosis not present

## 2019-11-19 DIAGNOSIS — M150 Primary generalized (osteo)arthritis: Secondary | ICD-10-CM | POA: Diagnosis not present

## 2019-11-19 DIAGNOSIS — Z6820 Body mass index (BMI) 20.0-20.9, adult: Secondary | ICD-10-CM | POA: Diagnosis not present

## 2019-11-19 DIAGNOSIS — Z79899 Other long term (current) drug therapy: Secondary | ICD-10-CM | POA: Diagnosis not present

## 2019-11-19 DIAGNOSIS — M4690 Unspecified inflammatory spondylopathy, site unspecified: Secondary | ICD-10-CM | POA: Diagnosis not present

## 2019-12-11 ENCOUNTER — Other Ambulatory Visit: Payer: Self-pay | Admitting: Certified Nurse Midwife

## 2019-12-11 DIAGNOSIS — R6882 Decreased libido: Secondary | ICD-10-CM

## 2019-12-11 MED ORDER — NONFORMULARY OR COMPOUNDED ITEM: 1 refills | Status: DC

## 2019-12-11 NOTE — Telephone Encounter (Signed)
Patient needs refill on testosterone ointment sent to [facility].

## 2019-12-11 NOTE — Telephone Encounter (Signed)
Pt requesting Testosterone cream refill. Pended if approved. To be printed and faxed to [facility].   AEX: 08/20/2019 with DL Next AEX: 08/23/20 MMG: 10/29/2019, Birads 1. Negative

## 2019-12-12 MED ORDER — NONFORMULARY OR COMPOUNDED ITEM: 1 refills | Status: DC

## 2019-12-12 NOTE — Telephone Encounter (Signed)
The patient hasn't had a testosterone level since 2019. I would recommend a free and total testosterone prior to refilling the script.

## 2019-12-12 NOTE — Telephone Encounter (Signed)
Will hold Rx and discuss with [name], CNM for refill on 12/15/2019. Pt hasnt had Testosterone level since 2019.

## 2019-12-12 NOTE — Addendum Note (Signed)
Addended by: Georgia Lopes on: 12/12/2019 08:33 AM   Modules accepted: Orders

## 2019-12-12 NOTE — Addendum Note (Signed)
Addended by: Georgia Lopes on: 12/12/2019 08:29 AM   Modules accepted: Orders

## 2019-12-12 NOTE — Telephone Encounter (Signed)
Reprinted Rx for Testosterone Cream. [name], CNM out of office. Dr Talbert Nan to sign Rx and then fax to [facility].   Encounter closed.

## 2019-12-15 MED ORDER — NONFORMULARY OR COMPOUNDED ITEM: 1 refills | Status: DC

## 2019-12-15 NOTE — Addendum Note (Signed)
Addended by: Georgia Lopes on: 12/15/2019 09:49 AM   Modules accepted: Orders

## 2019-12-15 NOTE — Addendum Note (Signed)
Addended by: Georgia Lopes on: 12/15/2019 10:20 AM   Modules accepted: Orders

## 2020-01-06 ENCOUNTER — Encounter: Payer: Self-pay | Admitting: Certified Nurse Midwife

## 2020-01-07 DIAGNOSIS — L821 Other seborrheic keratosis: Secondary | ICD-10-CM | POA: Diagnosis not present

## 2020-01-07 DIAGNOSIS — L603 Nail dystrophy: Secondary | ICD-10-CM | POA: Diagnosis not present

## 2020-01-07 DIAGNOSIS — L308 Other specified dermatitis: Secondary | ICD-10-CM | POA: Diagnosis not present

## 2020-01-07 DIAGNOSIS — Z85828 Personal history of other malignant neoplasm of skin: Secondary | ICD-10-CM | POA: Diagnosis not present

## 2020-01-19 DIAGNOSIS — G479 Sleep disorder, unspecified: Secondary | ICD-10-CM | POA: Diagnosis not present

## 2020-01-19 DIAGNOSIS — E7800 Pure hypercholesterolemia, unspecified: Secondary | ICD-10-CM | POA: Diagnosis not present

## 2020-01-19 DIAGNOSIS — Z0000 Encounter for general adult medical examination without abnormal findings: Secondary | ICD-10-CM | POA: Diagnosis not present

## 2020-01-19 DIAGNOSIS — E871 Hypo-osmolality and hyponatremia: Secondary | ICD-10-CM | POA: Diagnosis not present

## 2020-01-19 DIAGNOSIS — I10 Essential (primary) hypertension: Secondary | ICD-10-CM | POA: Diagnosis not present

## 2020-01-19 DIAGNOSIS — M899 Disorder of bone, unspecified: Secondary | ICD-10-CM | POA: Diagnosis not present

## 2020-01-19 DIAGNOSIS — Z1389 Encounter for screening for other disorder: Secondary | ICD-10-CM | POA: Diagnosis not present

## 2020-01-19 DIAGNOSIS — K219 Gastro-esophageal reflux disease without esophagitis: Secondary | ICD-10-CM | POA: Diagnosis not present

## 2020-02-16 DIAGNOSIS — M150 Primary generalized (osteo)arthritis: Secondary | ICD-10-CM | POA: Diagnosis not present

## 2020-02-16 DIAGNOSIS — M459 Ankylosing spondylitis of unspecified sites in spine: Secondary | ICD-10-CM | POA: Diagnosis not present

## 2020-02-16 DIAGNOSIS — Z6820 Body mass index (BMI) 20.0-20.9, adult: Secondary | ICD-10-CM | POA: Diagnosis not present

## 2020-02-16 DIAGNOSIS — M154 Erosive (osteo)arthritis: Secondary | ICD-10-CM | POA: Diagnosis not present

## 2020-02-16 DIAGNOSIS — Z1589 Genetic susceptibility to other disease: Secondary | ICD-10-CM | POA: Diagnosis not present

## 2020-02-16 DIAGNOSIS — R5383 Other fatigue: Secondary | ICD-10-CM | POA: Diagnosis not present

## 2020-02-18 ENCOUNTER — Encounter (HOSPITAL_BASED_OUTPATIENT_CLINIC_OR_DEPARTMENT_OTHER): Payer: PPO | Attending: Internal Medicine | Admitting: Physician Assistant

## 2020-02-18 DIAGNOSIS — H905 Unspecified sensorineural hearing loss: Secondary | ICD-10-CM | POA: Diagnosis not present

## 2020-02-18 DIAGNOSIS — X58XXXA Exposure to other specified factors, initial encounter: Secondary | ICD-10-CM | POA: Diagnosis not present

## 2020-02-18 DIAGNOSIS — I872 Venous insufficiency (chronic) (peripheral): Secondary | ICD-10-CM | POA: Insufficient documentation

## 2020-02-18 DIAGNOSIS — S81802A Unspecified open wound, left lower leg, initial encounter: Secondary | ICD-10-CM | POA: Insufficient documentation

## 2020-02-18 DIAGNOSIS — H353131 Nonexudative age-related macular degeneration, bilateral, early dry stage: Secondary | ICD-10-CM | POA: Diagnosis not present

## 2020-02-18 DIAGNOSIS — I7300 Raynaud's syndrome without gangrene: Secondary | ICD-10-CM | POA: Diagnosis not present

## 2020-02-18 DIAGNOSIS — M2041 Other hammer toe(s) (acquired), right foot: Secondary | ICD-10-CM | POA: Insufficient documentation

## 2020-02-18 DIAGNOSIS — Z79899 Other long term (current) drug therapy: Secondary | ICD-10-CM | POA: Diagnosis not present

## 2020-02-18 DIAGNOSIS — H5213 Myopia, bilateral: Secondary | ICD-10-CM | POA: Diagnosis not present

## 2020-02-18 DIAGNOSIS — I10 Essential (primary) hypertension: Secondary | ICD-10-CM | POA: Insufficient documentation

## 2020-02-18 DIAGNOSIS — L97822 Non-pressure chronic ulcer of other part of left lower leg with fat layer exposed: Secondary | ICD-10-CM | POA: Diagnosis not present

## 2020-02-18 DIAGNOSIS — M1990 Unspecified osteoarthritis, unspecified site: Secondary | ICD-10-CM | POA: Diagnosis not present

## 2020-02-18 DIAGNOSIS — H04123 Dry eye syndrome of bilateral lacrimal glands: Secondary | ICD-10-CM | POA: Diagnosis not present

## 2020-02-19 DIAGNOSIS — S81812A Laceration without foreign body, left lower leg, initial encounter: Secondary | ICD-10-CM | POA: Diagnosis not present

## 2020-02-19 NOTE — Progress Notes (Signed)
SUZEN, HETZER ([id number]) Visit Report for 02/18/2020 Abuse/Suicide Risk Screen Details Patient Name: Date of Service: [place], [place] 02/18/2020 9:00 A M Medical Record Number: [id number] Patient Account Number: [id number] Date of Birth/Sex: Treating RN: [birth year] (86 y.o. [name] Primary Care Demetrick Eichenberger: Simona Huh Other Clinician: Referring Eva Griffo: Treating Lashante Fryberger/Extender: Sudie Grumbling in Treatment: 0 Abuse/Suicide Risk Screen Items Answer ABUSE RISK SCREEN: Has anyone close to you tried to hurt or harm you recentlyo No Do you feel uncomfortable with anyone in your familyo No Has anyone forced you do things that you didnt want to doo No Electronic Signature(s) Signed: 02/19/2020 5:16:23 PM By: Levan Hurst RN, BSN Entered By: Levan Hurst on 02/18/2020 09:43:01 -------------------------------------------------------------------------------- Activities of Daily Living Details Patient Name: Date of Service: [place], [place] 02/18/2020 9:00 A M Medical Record Number: [id number] Patient Account Number: [id number] Date of Birth/Sex: Treating RN: 1932-11-04 (86 y.o. [name] Primary Care Fender Herder: Simona Huh Other Clinician: Referring Manoah Deckard: Treating Harjot Dibello/Extender: Sudie Grumbling in Treatment: 0 Activities of Daily Living Items Answer Activities of Daily Living (Please select one for each item) Drive Automobile Completely Able T Medications ake Completely Able Use T elephone Completely Able Care for Appearance Completely Able Use T oilet Completely Able Bath / Shower Completely Able Dress Self Completely Able Feed Self Completely Able Walk Completely Able Get In / Out Bed Completely Able Housework Completely Able Prepare Meals Completely [place] for Self Completely Able Electronic Signature(s) Signed: 02/19/2020 5:16:23 PM By: Levan Hurst RN,  BSN Entered By: Levan Hurst on 02/18/2020 09:43:22 -------------------------------------------------------------------------------- Education Screening Details Patient Name: Date of Service: [place], [place] 02/18/2020 9:00 A M Medical Record Number: [id number] Patient Account Number: [id number] Date of Birth/Sex: Treating RN: 1933-09-17 (86 y.o. [name] Primary Care Farin Buhman: Simona Huh Other Clinician: Referring Phillipe Clemon: Treating Encarnacion Bole/Extender: Sudie Grumbling in Treatment: 0 Primary Learner Assessed: Patient Learning Preferences/Education Level/Primary Language Learning Preference: Explanation Highest Education Level: College or Above Preferred Language: English Cognitive Barrier Language Barrier: No Translator Needed: No Memory Deficit: No Emotional Barrier: No Cultural/Religious Beliefs Affecting Medical Care: No Physical Barrier Impaired Vision: No Impaired Hearing: No Decreased Hand dexterity: No Knowledge/Comprehension Knowledge Level: High Comprehension Level: High Ability to understand written instructions: High Ability to understand verbal instructions: High Motivation Anxiety Level: Calm Cooperation: Cooperative Education Importance: Acknowledges Need Interest in Health Problems: Asks Questions Perception: Coherent Willingness to Engage in Self-Management High Activities: Readiness to Engage in Self-Management High Activities: Electronic Signature(s) Signed: 02/19/2020 5:16:23 PM By: Levan Hurst RN, BSN Entered By: Levan Hurst on 02/18/2020 09:43:48 -------------------------------------------------------------------------------- Fall Risk Assessment Details Patient Name: Date of Service: [place], [place] 02/18/2020 9:00 A M Medical Record Number: [id number] Patient Account Number: [id number] Date of Birth/Sex: Treating RN: 08-30-1933 (86 y.o. [name] Primary Care Shaarav Ripple: Simona Huh Other Clinician: Referring Doc Mandala: Treating Donte Lenzo/Extender: Sudie Grumbling in Treatment: 0 Fall Risk Assessment Items Have you had 2 or more falls in the last 12 monthso 0 No Have you had any fall that resulted in injury in the last 12 monthso 0 No FALLS RISK SCREEN History of falling - immediate or within 3 months 0 No Secondary diagnosis (Do you have 2 or more medical diagnoseso) 15 Yes Ambulatory aid None/bed rest/wheelchair/nurse 0 Yes Crutches/cane/walker 0 No Furniture 0 No Intravenous therapy Access/Saline/Heparin  Lock 0 No Gait/Transferring Normal/ bed rest/ wheelchair 0 Yes Weak (short steps with or without shuffle, stooped but able to lift head while walking, may seek 0 No support from furniture) Impaired (short steps with shuffle, may have difficulty arising from chair, head down, impaired 0 No balance) Mental Status Oriented to own ability 0 Yes Electronic Signature(s) Signed: 02/19/2020 5:16:23 PM By: Levan Hurst RN, BSN Entered By: Levan Hurst on 02/18/2020 09:43:57 -------------------------------------------------------------------------------- Foot Assessment Details Patient Name: Date of Service: [place], [place] 02/18/2020 9:00 A M Medical Record Number: [id number] Patient Account Number: [id number] Date of Birth/Sex: Treating RN: 23-Feb-1933 (86 y.o. [name] Primary Care Earnstine Meinders: Simona Huh Other Clinician: Referring Jeancarlo Leffler: Treating Shalana Jardin/Extender: Sudie Grumbling in Treatment: 0 Foot Assessment Items Site Locations + = Sensation present, - = Sensation absent, C = Callus, U = Ulcer R = Redness, W = Warmth, M = Maceration, PU = Pre-ulcerative lesion F = Fissure, S = Swelling, D = Dryness Assessment Right: Left: Other Deformity: No No Prior Foot Ulcer: No No Prior Amputation: No No Charcot Joint: No No Ambulatory Status: Ambulatory Without Help Gait: Steady Electronic  Signature(s) Signed: 02/19/2020 5:16:23 PM By: Levan Hurst RN, BSN Entered By: Levan Hurst on 02/18/2020 09:44:43 -------------------------------------------------------------------------------- Nutrition Risk Screening Details Patient Name: Date of Service: [place], [place] 02/18/2020 9:00 A M Medical Record Number: [id number] Patient Account Number: [id number] Date of Birth/Sex: Treating RN: [birth year] (86 y.o. [name] Primary Care Britlee Skolnik: Simona Huh Other Clinician: Referring Montasia Chisenhall: Treating Marny Smethers/Extender: Sudie Grumbling in Treatment: 0 Height (in): 62 Weight (lbs): 105 Body Mass Index (BMI): 19.2 Nutrition Risk Screening Items Score Screening NUTRITION RISK SCREEN: I have an illness or condition that made me change the kind and/or amount of food I eat 0 No I eat fewer than two meals per day 0 No I eat few fruits and vegetables, or milk products 0 No I have three or more drinks of beer, liquor or wine almost every day 0 No I have tooth or mouth problems that make it hard for me to eat 0 No I don't always have enough money to buy the food I need 0 No I eat alone most of the time 0 No I take three or more different prescribed or over-the-counter drugs a day 1 Yes Without wanting to, I have lost or gained 10 pounds in the last six months 0 No I am not always physically able to shop, cook and/or feed myself 2 Yes Nutrition Protocols Good Risk Protocol Moderate Risk Protocol 0 Provide education on nutrition High Risk Proctocol Risk Level: Moderate Risk Score: 3 Electronic Signature(s) Signed: 02/19/2020 5:16:23 PM By: Levan Hurst RN, BSN Entered By: Levan Hurst on 02/18/2020 09:44:26

## 2020-02-19 NOTE — Progress Notes (Signed)
TAKILA, DIVITO ([id number]) Visit Report for 02/18/2020 Chief Complaint Document Details Patient Name: Date of Service: [place], [place] 02/18/2020 9:00 A M Medical Record Number: [id number] Patient Account Number: [id number] Date of Birth/Sex: Treating RN: 1933/03/22 (86 y.o. [name] Primary Care Provider: Simona Huh Other Clinician: Referring Provider: Treating Provider/Extender: Sudie Grumbling in Treatment: 0 Information Obtained from: Patient Chief Complaint Left LE Ulcer/Skin Tear Electronic Signature(s) Signed: 02/18/2020 10:41:01 AM By: Worthy Keeler PA-C Entered By: Worthy Keeler on 02/18/2020 10:41:01 -------------------------------------------------------------------------------- HPI Details Patient Name: Date of Service: [place], [place] 02/18/2020 9:00 A M Medical Record Number: [id number] Patient Account Number: [id number] Date of Birth/Sex: Treating RN: 1933-02-17 (86 y.o. [name] Primary Care Provider: Simona Huh Other Clinician: Referring Provider: Treating Provider/Extender: Sudie Grumbling in Treatment: 0 History of Present Illness HPI Description: READMISSION 07/15/2019 Patient is now an 86 year old woman who was previously here in 2016 and 2018. Cared for at the time by Dr. Con Memos. Both times with wounds related to trauma in the left leg. She was felt to have underlying venous insufficiency although both occasions were related to trauma. The patient tells me that 2 weeks ago she hit her lateral left leg on the car door. This was a skin tear with a flap for a period of time she has been using Polysporin. The flap came off recently she has a clean looking superficial wound on the left lateral leg. Our intake nurse reported some drainage. Past medical history; includes sensorineural hearing loss, osteoarthritis, hypertension and a hammertoe on the right foot for which she follows with  podiatry. ABIs in our clinic were 1.19 on the left 07/21/2019; the patient did not like the wraps. They slid down and rub the wound the wound is measuring larger she is upset. 10/12; left lateral leg wound. Most of this looks healthy even under illumination. We have been using Hydrofera Blue under border foam. She would not allow compression 10/19; left lateral leg wound. 2 small open areas remain of the original wound. We have been using Hydrofera Blue under border foam. This seems to be making decent progress 10/26 left lateral leg traumatic wound. There is no open wound remaining. We have been using Hydrofera Blue under border foam she arrived with some denuded epithelium that I removed there is no open wound remaining. Is fairly clear she has some degree of chronic venous insufficiency with not a lot of edema but dilated veins in her feet. She reminds me that she also has reactive arthritis and was on prednisone for a prolonged period of time. Nevertheless I think it would be beneficial for her to at least wear support stockings but right now I do not think she is going to agree to the Readmission: 02/18/2020 upon evaluation today patient has sustained a skin tear which occurred about 1 week ago she tells me. Fortunately there does not appear to be any signs of active infection at this time which is excellent news. She has been tolerating the dressing changes without complication. Overall very pleased with where things stand at this point. The only issue that I see here is that the skin flap somewhat folded back and then reattached further down causing an area that is actually bunched up to form where it closed on itself but then reattached on the end of the tissue. Nonetheless I think we have to trim off the bunched up tissue  in order to allow this to heal appropriately. That is the only issue I really see today. Electronic Signature(s) Signed: 02/18/2020 6:26:35 PM By: Worthy Keeler  PA-C Entered By: Worthy Keeler on 02/18/2020 18:26:34 -------------------------------------------------------------------------------- Physical Exam Details Patient Name: Date of Service: [place], [place] 02/18/2020 9:00 A M Medical Record Number: [id number] Patient Account Number: [id number] Date of Birth/Sex: Treating RN: [birth year] (86 y.o. [name] Primary Care Provider: Simona Huh Other Clinician: Referring Provider: Treating Provider/Extender: Sudie Grumbling in Treatment: 0 Constitutional patient is hypertensive.. pulse regular and within target range for patient.Marland Kitchen respirations regular, non-labored and within target range for patient.Marland Kitchen temperature within target range for patient.. Well-nourished and well-hydrated in no acute distress. Eyes conjunctiva clear no eyelid edema noted. pupils equal round and reactive to light and accommodation. Ears, Nose, Mouth, and Throat no gross abnormality of ear auricles or external auditory canals. normal hearing noted during conversation. mucus membranes moist. Respiratory normal breathing without difficulty. Cardiovascular 2+ dorsalis pedis/posterior tibialis pulses. no clubbing, cyanosis, significant edema, <3 sec cap refill. Musculoskeletal normal gait and posture. no significant deformity or arthritic changes, no loss or range of motion, no clubbing. Psychiatric this patient is able to make decisions and demonstrates good insight into disease process. Alert and Oriented x 3. pleasant and cooperative. Notes His wound again today did require sharp debridement to remove the tissue that was folded in on itself before reattaching to the skin. I did trim this away and once trimmed away the wound bed actually appears to be doing quite well. The patient tolerated this with minimal discomfort and post debridement the wound bed is excellent. I think this is very healthy and I think she will do quite well as far  as healing is concerned I do not believe she needs a compression wrap at this point she really has no significant swelling which is also great news. Electronic Signature(s) Signed: 02/18/2020 6:27:06 PM By: Worthy Keeler PA-C Entered By: Worthy Keeler on 02/18/2020 18:27:06 -------------------------------------------------------------------------------- Physician Orders Details Patient Name: Date of Service: [place], [place] 02/18/2020 9:00 A M Medical Record Number: [id number] Patient Account Number: [id number] Date of Birth/Sex: Treating RN: December 02, 1932 (86 y.o. [name] Primary Care Provider: Simona Huh Other Clinician: Referring Provider: Treating Provider/Extender: Sudie Grumbling in Treatment: 0 Verbal / Phone Orders: No Diagnosis Coding ICD-10 Coding Code Description [phone] Unspecified open wound, left lower leg, initial encounter L97.822 Non-pressure chronic ulcer of other part of left lower leg with fat layer exposed [place] (primary) hypertension Follow-up Appointments Return Appointment in 1 week. Dressing Change Frequency Wound #5 Left,Anterior Lower Leg Change Dressing every other day. Wound Cleansing Wound #5 Left,Anterior Lower Leg May shower and wash wound with soap and water. - use dial antibacterial soap with dressing changes Primary Wound Dressing Wound #5 Left,Anterior Lower Leg Xeroform Secondary Dressing Wound #5 Left,Anterior Lower Leg Foam Border Electronic Signature(s) Signed: 02/18/2020 5:03:53 PM By: Baruch Gouty RN, BSN Signed: 02/18/2020 6:28:26 PM By: Worthy Keeler PA-C Entered By: Baruch Gouty on 02/18/2020 10:47:08 -------------------------------------------------------------------------------- Problem List Details Patient Name: Date of Service: [place], [place] 02/18/2020 9:00 A M Medical Record Number: [id number] Patient Account Number: [id number] Date of Birth/Sex: Treating RN: [birth year]  (86 y.o. [name] Primary Care Provider: Simona Huh Other Clinician: Referring Provider: Treating Provider/Extender: Sudie Grumbling in Treatment: 0 Active Problems ICD-10 Encounter  Code Description Active Date MDM Diagnosis S81.802A Unspecified open wound, left lower leg, initial encounter 02/18/2020 No Yes L97.822 Non-pressure chronic ulcer of other part of left lower leg with fat layer exposed5/02/2020 No Yes I10 Essential (primary) hypertension 02/18/2020 No Yes Inactive Problems Resolved Problems Electronic Signature(s) Signed: 02/18/2020 10:40:46 AM By: Worthy Keeler PA-C Entered By: Worthy Keeler on 02/18/2020 10:40:45 -------------------------------------------------------------------------------- Progress Note Details Patient Name: Date of Service: [place], [place] 02/18/2020 9:00 A M Medical Record Number: [id number] Patient Account Number: [id number] Date of Birth/Sex: Treating RN: 1933/01/05 (86 y.o. [name] Primary Care Provider: Simona Huh Other Clinician: Referring Provider: Treating Provider/Extender: Sudie Grumbling in Treatment: 0 Subjective Chief Complaint Information obtained from Patient Left LE Ulcer/Skin Tear History of Present Illness (HPI) READMISSION 07/15/2019 Patient is now an 86 year old woman who was previously here in 2016 and 2018. Cared for at the time by Dr. Con Memos. Both times with wounds related to trauma in the left leg. She was felt to have underlying venous insufficiency although both occasions were related to trauma. The patient tells me that 2 weeks ago she hit her lateral left leg on the car door. This was a skin tear with a flap for a period of time she has been using Polysporin. The flap came off recently she has a clean looking superficial wound on the left lateral leg. Our intake nurse reported some drainage. Past medical history; includes sensorineural  hearing loss, osteoarthritis, hypertension and a hammertoe on the right foot for which she follows with podiatry. ABIs in our clinic were 1.19 on the left 07/21/2019; the patient did not like the wraps. They slid down and rub the wound the wound is measuring larger she is upset. 10/12; left lateral leg wound. Most of this looks healthy even under illumination. We have been using Hydrofera Blue under border foam. She would not allow compression 10/19; left lateral leg wound. 2 small open areas remain of the original wound. We have been using Hydrofera Blue under border foam. This seems to be making decent progress 10/26 left lateral leg traumatic wound. There is no open wound remaining. We have been using Hydrofera Blue under border foam she arrived with some denuded epithelium that I removed there is no open wound remaining. Is fairly clear she has some degree of chronic venous insufficiency with not a lot of edema but dilated veins in her feet. She reminds me that she also has reactive arthritis and was on prednisone for a prolonged period of time. Nevertheless I think it would be beneficial for her to at least wear support stockings but right now I do not think she is going to agree to the Readmission: 02/18/2020 upon evaluation today patient has sustained a skin tear which occurred about 1 week ago she tells me. Fortunately there does not appear to be any signs of active infection at this time which is excellent news. She has been tolerating the dressing changes without complication. Overall very pleased with where things stand at this point. The only issue that I see here is that the skin flap somewhat folded back and then reattached further down causing an area that is actually bunched up to form where it closed on itself but then reattached on the end of the tissue. Nonetheless I think we have to trim off the bunched up tissue in order to allow this to heal appropriately. That is the only issue I  really see today. Patient  History Information obtained from Patient. Allergies No Known Allergies Family History Cancer - Father, No family history of Diabetes, Heart Disease, Hereditary Spherocytosis, Hypertension, Kidney Disease, Lung Disease, Seizures, Stroke, Thyroid Problems, Tuberculosis. Social History Former smoker - quit 25 yr ago, Marital Status - Married, Alcohol Use - Daily - vodka, wine, Drug Use - No History, Caffeine Use - Daily - tea. Medical History Eyes Patient has history of Cataracts - bil removed Denies history of Glaucoma, Optic Neuritis Ear/Nose/Mouth/Throat Denies history of Chronic sinus problems/congestion, Middle ear problems Hematologic/Lymphatic Denies history of Anemia, Hemophilia, Human Immunodeficiency Virus, Lymphedema, Sickle Cell Disease Respiratory Denies history of Aspiration, Asthma, Chronic Obstructive Pulmonary Disease (COPD), Pneumothorax, Sleep Apnea, Tuberculosis Cardiovascular Patient has history of Hypertension Denies history of Angina, Arrhythmia, Congestive Heart Failure, Coronary Artery Disease, Deep Vein Thrombosis, Hypotension, Myocardial Infarction, Peripheral Arterial Disease, Peripheral Venous Disease, Phlebitis, Vasculitis Gastrointestinal Denies history of Cirrhosis , Colitis, Crohnoos, Hepatitis A, Hepatitis B, Hepatitis C Endocrine Denies history of Type I Diabetes, Type II Diabetes Genitourinary Denies history of End Stage Renal Disease Immunological Patient has history of Raynaudoos Denies history of Lupus Erythematosus, Scleroderma Integumentary (Skin) Denies history of History of Burn Musculoskeletal Patient has history of Osteoarthritis Denies history of Gout, Rheumatoid Arthritis, Osteomyelitis Neurologic Denies history of Dementia, Neuropathy, Quadriplegia, Paraplegia, Seizure Disorder Oncologic Denies history of Received Chemotherapy, Received Radiation Psychiatric Denies history of Anorexia/bulimia,  Confinement Anxiety Hospitalization/Surgery History - lumbar laminectomy. - facial cosmetic surgery. - titanium rod placed right index finger. - tonsillectomy. - tubal ligation. - vaginal hysterectomy. - bil cataract. - appendectomy. - left shoulder replacement. Medical A Surgical History Notes nd Cardiovascular Hx SVT Gastrointestinal GERD Immunological reactive arthritis Musculoskeletal reactive arthritis Review of Systems (ROS) Constitutional Symptoms (General Health) Denies complaints or symptoms of Fatigue, Fever, Chills, Marked Weight Change. Ear/Nose/Mouth/Throat Denies complaints or symptoms of Chronic sinus problems or rhinitis. Respiratory Denies complaints or symptoms of Chronic or frequent coughs, Shortness of Breath. Endocrine Denies complaints or symptoms of Heat/cold intolerance. Genitourinary Denies complaints or symptoms of Frequent urination. Integumentary (Skin) Complains or has symptoms of Wounds - wound on left lower leg. Neurologic Denies complaints or symptoms of Numbness/parasthesias. Psychiatric Denies complaints or symptoms of Claustrophobia, Suicidal. Objective Constitutional patient is hypertensive.. pulse regular and within target range for patient.Marland Kitchen respirations regular, non-labored and within target range for patient.Marland Kitchen temperature within target range for patient.. Well-nourished and well-hydrated in no acute distress. Vitals Time Taken: 9:36 AM, Height: 62 in, Source: Stated, Weight: 105 lbs, Source: Stated, BMI: 19.2, Temperature: 98.4 F, Pulse: 47 bpm, Respiratory Rate: 18 breaths/min, Blood Pressure: 150/46 mmHg. Eyes conjunctiva clear no eyelid edema noted. pupils equal round and reactive to light and accommodation. Ears, Nose, Mouth, and Throat no gross abnormality of ear auricles or external auditory canals. normal hearing noted during conversation. mucus membranes moist. Respiratory normal breathing without  difficulty. Cardiovascular 2+ dorsalis pedis/posterior tibialis pulses. no clubbing, cyanosis, significant edema, Musculoskeletal normal gait and posture. no significant deformity or arthritic changes, no loss or range of motion, no clubbing. Psychiatric this patient is able to make decisions and demonstrates good insight into disease process. Alert and Oriented x 3. pleasant and cooperative. General Notes: His wound again today did require sharp debridement to remove the tissue that was folded in on itself before reattaching to the skin. I did trim this away and once trimmed away the wound bed actually appears to be doing quite well. The patient tolerated this with minimal discomfort and post debridement the wound bed  is excellent. I think this is very healthy and I think she will do quite well as far as healing is concerned I do not believe she needs a compression wrap at this point she really has no significant swelling which is also great news. Integumentary (Hair, Skin) Wound #5 status is Open. Original cause of wound was Trauma. The wound is located on the Left,Anterior Lower Leg. The wound measures 2.2cm length x 2cm width x 0.1cm depth; 3.456cm^2 area and 0.346cm^3 volume. There is no tunneling or undermining noted. There is a medium amount of serosanguineous drainage noted. The wound margin is flat and intact. There is large (67-100%) red granulation within the wound bed. There is no necrotic tissue within the wound bed. Assessment Active Problems ICD-10 Unspecified open wound, left lower leg, initial encounter Non-pressure chronic ulcer of other part of left lower leg with fat layer exposed Essential (primary) hypertension Plan Follow-up Appointments: Return Appointment in 1 week. Dressing Change Frequency: Wound #5 Left,Anterior Lower Leg: Change Dressing every other day. Wound Cleansing: Wound #5 Left,Anterior Lower Leg: May shower and wash wound with soap and water. - use  dial antibacterial soap with dressing changes Primary Wound Dressing: Wound #5 Left,Anterior Lower Leg: Xeroform Secondary Dressing: Wound #5 Left,Anterior Lower Leg: Foam Border 1. I would recommend currently that we initiate treatment with a Xeroform gauze dressing which I think will do well not to stick to the wound bed and promote good tissue growth. 2. I recommend we cover with a border foam dressing this can be changed every other day. 3. It also recommend the patient can clean the area with mild soap and water such as Dial antibacterial soap without complication at this point. We will see patient back for reevaluation in 1 week here in the clinic. If anything worsens or changes patient will contact our office for additional recommendations. Electronic Signature(s) Signed: 02/18/2020 6:27:46 PM By: Worthy Keeler PA-C Entered By: Worthy Keeler on 02/18/2020 18:27:46 -------------------------------------------------------------------------------- HxROS Details Patient Name: Date of Service: [place], [place] 02/18/2020 9:00 A M Medical Record Number: [id number] Patient Account Number: [id number] Date of Birth/Sex: Treating RN: 08/06/1933 (86 y.o. [name] Primary Care Provider: Simona Huh Other Clinician: Referring Provider: Treating Provider/Extender: Sudie Grumbling in Treatment: 0 Information Obtained From Patient Constitutional Symptoms (General Health) Complaints and Symptoms: Negative for: Fatigue; Fever; Chills; Marked Weight Change Ear/Nose/Mouth/Throat Complaints and Symptoms: Negative for: Chronic sinus problems or rhinitis Medical History: Negative for: Chronic sinus problems/congestion; Middle ear problems Respiratory Complaints and Symptoms: Negative for: Chronic or frequent coughs; Shortness of Breath Medical History: Negative for: Aspiration; Asthma; Chronic Obstructive Pulmonary Disease (COPD); Pneumothorax; Sleep  Apnea; Tuberculosis Endocrine Complaints and Symptoms: Negative for: Heat/cold intolerance Medical History: Negative for: Type I Diabetes; Type II Diabetes Genitourinary Complaints and Symptoms: Negative for: Frequent urination Medical History: Negative for: End Stage Renal Disease Integumentary (Skin) Complaints and Symptoms: Positive for: Wounds - wound on left lower leg Medical History: Negative for: History of Burn Neurologic Complaints and Symptoms: Negative for: Numbness/parasthesias Medical History: Negative for: Dementia; Neuropathy; Quadriplegia; Paraplegia; Seizure Disorder Psychiatric Complaints and Symptoms: Negative for: Claustrophobia; Suicidal Medical History: Negative for: Anorexia/bulimia; Confinement Anxiety Eyes Medical History: Positive for: Cataracts - bil removed Negative for: Glaucoma; Optic Neuritis Hematologic/Lymphatic Medical History: Negative for: Anemia; Hemophilia; Human Immunodeficiency Virus; Lymphedema; Sickle Cell Disease Cardiovascular Medical History: Positive for: Hypertension Negative for: Angina; Arrhythmia; Congestive Heart Failure; Coronary Artery Disease; Deep Vein Thrombosis; Hypotension;  Myocardial Infarction; Peripheral Arterial Disease; Peripheral Venous Disease; Phlebitis; Vasculitis Past Medical History Notes: Hx SVT Gastrointestinal Medical History: Negative for: Cirrhosis ; Colitis; Crohns; Hepatitis A; Hepatitis B; Hepatitis C Past Medical History Notes: GERD Immunological Medical History: Positive for: Raynauds Negative for: Lupus Erythematosus; Scleroderma Past Medical History Notes: reactive arthritis Musculoskeletal Medical History: Positive for: Osteoarthritis Negative for: Gout; Rheumatoid Arthritis; Osteomyelitis Past Medical History Notes: reactive arthritis Oncologic Medical History: Negative for: Received Chemotherapy; Received Radiation HBO Extended History  Items Eyes: Cataracts Immunizations Pneumococcal Vaccine: Received Pneumococcal Vaccination: Yes Implantable Devices Yes Hospitalization / Surgery History Type of Hospitalization/Surgery lumbar laminectomy facial cosmetic surgery titanium rod placed right index finger tonsillectomy tubal ligation vaginal hysterectomy bil cataract appendectomy left shoulder replacement Family and Social History Cancer: Yes - Father; Diabetes: No; Heart Disease: No; Hereditary Spherocytosis: No; Hypertension: No; Kidney Disease: No; Lung Disease: No; Seizures: No; Stroke: No; Thyroid Problems: No; Tuberculosis: No; Former smoker - quit 25 yr ago; Marital Status - Married; Alcohol Use: Daily - vodka, wine; Drug Use: No History; Caffeine Use: Daily - tea; Financial Concerns: No; Food, Clothing or Shelter Needs: No; Support System Lacking: No; Transportation Concerns: No Electronic Signature(s) Signed: 02/18/2020 6:28:26 PM By: Worthy Keeler PA-C Signed: 02/19/2020 5:16:23 PM By: Levan Hurst RN, BSN Entered By: Levan Hurst on 02/18/2020 09:42:47 -------------------------------------------------------------------------------- [place] Details Patient Name: Date of Service: [place], [place] 02/18/2020 Medical Record Number: [id number] Patient Account Number: [id number] Date of Birth/Sex: Treating RN: 1933/06/14 (86 y.o. [name] Primary Care Provider: Simona Huh Other Clinician: Referring Provider: Treating Provider/Extender: Sudie Grumbling in Treatment: 0 Diagnosis Coding ICD-10 Codes Code Description [phone] Unspecified open wound, left lower leg, initial encounter L97.822 Non-pressure chronic ulcer of other part of left lower leg with fat layer exposed [place] (primary) hypertension Facility Procedures CPT4 Code: [id number] Description: 99214 - WOUND CARE VISIT-LEV 4 EST PT Modifier: Quantity: 1 Physician Procedures : CPT4 Code  Description Modifier [id number] - WC PHYS LEVEL 3 - EST PT ICD-10 Diagnosis Description S81.802A Unspecified open wound, left lower leg, initial encounter L97.822 Non-pressure chronic ulcer of other part of left lower leg with fat layer  exposed I10 Essential (primary) hypertension Quantity: 1 Electronic Signature(s) Signed: 02/18/2020 6:28:08 PM By: Worthy Keeler PA-C Previous Signature: 02/18/2020 5:03:53 PM Version By: Baruch Gouty RN, BSN Entered By: Worthy Keeler on 02/18/2020 [id number]

## 2020-02-19 NOTE — Progress Notes (Signed)
[name], [name] ([id number]) Visit Report for 02/18/2020 Allergy List Details Patient Name: Date of Service: [name], [place] 02/18/2020 9:00 A M Medical Record Number: [id number] Patient Account Number: [id number] Date of Birth/Sex: Treating RN: 11/20/1932 (86 y.o. [name] Primary Care [name]: [name] Other Clinician: Referring [name]: Treating [name]/Extender: [name] in Treatment: 0 Allergies Active Allergies No Known Allergies Allergy Notes Electronic Signature(s) Signed: 02/19/2020 5:16:23 PM By: Levan Hurst RN, BSN Entered By: Levan Hurst on 02/18/2020 09:40:36 -------------------------------------------------------------------------------- Arrival Information Details Patient Name: Date of Service: [name], [name] 02/18/2020 9:00 A M Medical Record Number: [id number] Patient Account Number: [id number] Date of Birth/Sex: Treating RN: [birth year] (86 y.o. [name] Primary Care Yandel Zeiner: [name] Other Clinician: Referring Shanan Mcmiller: Treating [name]/Extender: [name] in Treatment: 0 Visit Information Patient Arrived: Ambulatory Arrival Time: 09:35 Accompanied By: alone Transfer Assistance: None Patient Identification Verified: Yes Secondary Verification Process Completed: Yes Patient Requires Transmission-Based Precautions: No Patient Has Alerts: Yes Patient Alerts: L ABI: 1.19 History Since Last Visit Added or deleted any medications: No Any new allergies or adverse reactions: No Had a fall or experienced change in activities of daily living that may affect risk of falls: No Signs or symptoms of abuse/neglect since last visito No Hospitalized since last visit: No Implantable device outside of the clinic excluding cellular tissue based products placed in the center since last visit: No Pain Present Now: No Electronic Signature(s) Signed: 02/19/2020 5:16:23 PM By:  Levan Hurst RN, BSN Entered By: Levan Hurst on 02/18/2020 09:40:17 -------------------------------------------------------------------------------- Clinic Level of Care Assessment Details Patient Name: Date of Service: [name], [name] 02/18/2020 9:00 A M Medical Record Number: [id number] Patient Account Number: [id number] Date of Birth/Sex: Treating RN: 07-19-1933 (86 y.o. [name] Primary Care [name]: [name] Other Clinician: Referring [name]: Treating [name]/Extender: [name] in Treatment: 0 Clinic Level of Care Assessment Items TOOL 2 Quantity Score []  - 0 Use when only an EandM is performed on the INITIAL visit ASSESSMENTS - Nursing Assessment / Reassessment X- 1 20 General Physical Exam (combine w/ comprehensive assessment (listed just below) when performed on new pt. evals) X- 1 25 Comprehensive Assessment (HX, ROS, Risk Assessments, Wounds Hx, etc.) ASSESSMENTS - Wound and Skin A ssessment / Reassessment X - Simple Wound Assessment / Reassessment - one wound 1 5 []  - 0 Complex Wound Assessment / Reassessment - multiple wounds []  - 0 Dermatologic / Skin Assessment (not related to wound area) ASSESSMENTS - Ostomy and/or Continence Assessment and Care []  - 0 Incontinence Assessment and Management []  - 0 Ostomy Care Assessment and Management (repouching, etc.) PROCESS - Coordination of Care X - Simple Patient / Family Education for ongoing care 1 15 []  - 0 Complex (extensive) Patient / Family Education for ongoing care X- 1 10 Staff obtains Programmer, systems, Records, T Results / Process Orders est []  - 0 Staff telephones HHA, Nursing Homes / Clarify orders / etc []  - 0 Routine Transfer to another Facility (non-emergent condition) []  - 0 Routine Hospital Admission (non-emergent condition) X- 1 15 New Admissions / Biomedical engineer / Ordering NPWT Apligraf, etc. , []  - 0 Emergency Hospital Admission  (emergent condition) X- 1 10 Simple Discharge Coordination []  - 0 Complex (extensive) Discharge Coordination PROCESS - Special Needs []  - 0 Pediatric / Minor Patient Management []  - 0 Isolation Patient Management []  - 0 Hearing /  Language / Visual special needs []  - 0 Assessment of Community assistance (transportation, D/C planning, etc.) []  - 0 Additional assistance / Altered mentation []  - 0 Support Surface(s) Assessment (bed, cushion, seat, etc.) INTERVENTIONS - Wound Cleansing / Measurement X- 1 5 Wound Imaging (photographs - any number of wounds) []  - 0 Wound Tracing (instead of photographs) X- 1 5 Simple Wound Measurement - one wound []  - 0 Complex Wound Measurement - multiple wounds X- 1 5 Simple Wound Cleansing - one wound []  - 0 Complex Wound Cleansing - multiple wounds INTERVENTIONS - Wound Dressings X - Small Wound Dressing one or multiple wounds 1 10 []  - 0 Medium Wound Dressing one or multiple wounds []  - 0 Large Wound Dressing one or multiple wounds []  - 0 Application of Medications - injection INTERVENTIONS - Miscellaneous []  - 0 External ear exam []  - 0 Specimen Collection (cultures, biopsies, blood, body fluids, etc.) []  - 0 Specimen(s) / Culture(s) sent or taken to Lab for analysis []  - 0 Patient Transfer (multiple staff / Civil Service fast streamer / Similar devices) []  - 0 Simple Staple / Suture removal (25 or less) []  - 0 Complex Staple / Suture removal (26 or more) []  - 0 Hypo / Hyperglycemic Management (close monitor of Blood Glucose) []  - 0 Ankle / Brachial Index (ABI) - do not check if billed separately Has the patient been seen at the hospital within the last three years: Yes Total Score: 125 Level Of Care: New/Established - Level 4 Electronic Signature(s) Signed: 02/18/2020 5:03:53 PM By: Baruch Gouty RN, BSN Entered By: Baruch Gouty on 02/18/2020  10:45:43 -------------------------------------------------------------------------------- Lower Extremity Assessment Details Patient Name: Date of Service: [name], [name]. 02/18/2020 9:00 A M Medical Record Number: [id number] Patient Account Number: [id number] Date of Birth/Sex: Treating RN: 10-25-1932 (86 y.o. [name] Primary Care [name]: [name] Other Clinician: Referring Julyanna Scholle: Treating Oluwatimilehin Balfour/Extender: [name] in Treatment: 0 Edema Assessment Assessed: [Left: No] [Right: No] Edema: [Left: N] [Right: o] Calf Left: Right: Point of Measurement: cm From Medial Instep 30 cm cm Ankle Left: Right: Point of Measurement: cm From Medial Instep 17.4 cm cm Vascular Assessment Pulses: Dorsalis Pedis Palpable: [Left:Yes] Electronic Signature(s) Signed: 02/19/2020 5:16:23 PM By: Levan Hurst RN, BSN Entered By: Levan Hurst on 02/18/2020 09:45:25 -------------------------------------------------------------------------------- [place] Details Patient Name: Date of Service: [place], [place] 02/18/2020 9:00 A M Medical Record Number: [id number] Patient Account Number: [id number] Date of Birth/Sex: Treating RN: [birth year] (86 y.o. [name] Primary Care Amiera Herzberg: [name] Other Clinician: Referring Argenis Kumari: Treating Maebell Lyvers/Extender: [name] in Treatment: 0 Active Inactive Wound/Skin Impairment Nursing Diagnoses: Impaired tissue integrity Knowledge deficit related to ulceration/compromised skin integrity Goals: Patient/caregiver will verbalize understanding of skin care regimen Date Initiated: 02/18/2020 Target Resolution Date: 03/17/2020 Goal Status: Active Ulcer/skin breakdown will have a volume reduction of 30% by week 4 Date Initiated: 02/18/2020 Target Resolution Date: 03/17/2020 Goal Status: Active Interventions: Assess patient/caregiver ability to  obtain necessary supplies Assess patient/caregiver ability to perform ulcer/skin care regimen upon admission and as needed Assess ulceration(s) every visit Treatment Activities: Skin care regimen initiated : 02/18/2020 Topical wound management initiated : 02/18/2020 Notes: Electronic Signature(s) Signed: 02/18/2020 5:03:53 PM By: Baruch Gouty RN, BSN Entered By: Baruch Gouty on 02/18/2020 10:43:22 -------------------------------------------------------------------------------- Pain Assessment Details Patient Name: Date of Service: [place], [place]a [place]. 02/18/2020 9:00 A M Medical Record Number: [id number] Patient Account Number: [id number] Date of  Birth/Sex: Treating RN: [birth year] (86 y.o. [name] Primary Care Dewanna Hurston: [name] Other Clinician: Referring Kaydynce Pat: Treating Haylin Camilli/Extender: [name] in Treatment: 0 Active Problems Location of Pain Severity and Description of Pain Patient Has Paino No Site Locations Pain Management and Medication Current Pain Management: Electronic Signature(s) Signed: 02/19/2020 5:16:23 PM By: Levan Hurst RN, BSN Entered By: Levan Hurst on 02/18/2020 09:46:40 -------------------------------------------------------------------------------- Patient/Caregiver Education Details Patient Name: Date of Service: [place], [place] R[place] L. 5/5/2021andnbsp9:00 A M Medical Record Number: [id number] Patient Account Number: [id number] Date of Birth/Gender: Treating RN: 17-Jul-1933 (86 y.o. [name] Primary Care Physician: [name] Other Clinician: Referring Physician: Treating Physician/Extender: [name] in Treatment: 0 Education Assessment Education Provided To: Patient Education Topics Provided Wound/Skin Impairment: Methods: Explain/Verbal Responses: Reinforcements needed, State content correctly Electronic Signature(s) Signed: 02/18/2020 5:03:53 PM By:  Baruch Gouty RN, BSN Entered By: Baruch Gouty on 02/18/2020 10:43:36 -------------------------------------------------------------------------------- Wound Assessment Details Patient Name: Date of Service: [place], [place] 02/18/2020 9:00 A M Medical Record Number: [id number] Patient Account Number: [id number] Date of Birth/Sex: Treating RN: 1933/03/18 (86 y.o. [name] Primary Care Kara Melching: [name] Other Clinician: Referring Blayne Garlick: Treating Garey Alleva/Extender: [name] in Treatment: 0 Wound Status Wound Number: 5 Primary Etiology: Skin Tear Wound Location: Left, Anterior Lower Leg Wound Status: Open Wounding Event: Trauma Comorbid History: Cataracts, Hypertension, Raynauds, Osteoarthritis Date Acquired: 02/11/2020 Weeks Of Treatment: 0 Clustered Wound: No Photos Photo Uploaded By: Mikeal Hawthorne on 02/19/2020 12:31:44 Wound Measurements Length: (cm) 2.2 Width: (cm) 2 Depth: (cm) 0.1 Area: (cm) 3.456 Volume: (cm) 0.346 % Reduction in Area: % Reduction in Volume: Epithelialization: None Tunneling: No Undermining: No Wound Description Classification: Partial Thickness Wound Margin: Flat and Intact Exudate Amount: Medium Exudate Type: Serosanguineous Exudate Color: red, brown Foul Odor After Cleansing: No Slough/Fibrino No Wound Bed Granulation Amount: Large (67-100%) Exposed Structure Granulation Quality: Red Fascia Exposed: No Necrotic Amount: None Present (0%) Fat Layer (Subcutaneous Tissue) Exposed: No Tendon Exposed: No Muscle Exposed: No Joint Exposed: No Bone Exposed: No Electronic Signature(s) Signed: 02/19/2020 5:16:23 PM By: Levan Hurst RN, BSN Entered By: Levan Hurst on 02/18/2020 09:46:28 -------------------------------------------------------------------------------- [place] Details Patient Name: Date of Service: [place], [place] 02/18/2020 9:00 A M Medical Record Number:  [id number] Patient Account Number: [id number] Date of Birth/Sex: Treating RN: 1933/05/05 (86 y.o. [name] Primary Care Jeanne Diefendorf: [name] Other Clinician: Referring Clayson Riling: Treating Dian Laprade/Extender: [name] in Treatment: 0 Vital Signs Time Taken: 09:36 Temperature (F): 98.4 Height (in): 62 Pulse (bpm): 47 Source: Stated Respiratory Rate (breaths/min): 18 Weight (lbs): 105 Blood Pressure (mmHg): 150/46 Source: Stated Reference Range: 80 - 120 mg / dl Body Mass Index (BMI): 19.2 Electronic Signature(s) Signed: 02/19/2020 5:16:23 PM By: Levan Hurst RN, BSN Entered By: Levan Hurst on 02/18/2020 09:38:04

## 2020-02-24 ENCOUNTER — Encounter (HOSPITAL_BASED_OUTPATIENT_CLINIC_OR_DEPARTMENT_OTHER): Payer: PPO | Admitting: Internal Medicine

## 2020-02-25 ENCOUNTER — Encounter (HOSPITAL_BASED_OUTPATIENT_CLINIC_OR_DEPARTMENT_OTHER): Payer: PPO | Admitting: Physician Assistant

## 2020-02-26 DIAGNOSIS — M459 Ankylosing spondylitis of unspecified sites in spine: Secondary | ICD-10-CM | POA: Diagnosis not present

## 2020-03-03 ENCOUNTER — Encounter (HOSPITAL_BASED_OUTPATIENT_CLINIC_OR_DEPARTMENT_OTHER): Payer: PPO | Admitting: Physician Assistant

## 2020-03-03 ENCOUNTER — Other Ambulatory Visit: Payer: Self-pay

## 2020-03-03 DIAGNOSIS — S81802A Unspecified open wound, left lower leg, initial encounter: Secondary | ICD-10-CM | POA: Diagnosis not present

## 2020-03-03 NOTE — Progress Notes (Signed)
RIONNA, GLEED ([id number]) Visit Report for 03/03/2020 Arrival Information Details Patient Name: Date of Service: [place], [place] 03/03/2020 8:30 A M Medical Record Number: [id number] Patient Account Number: [id number] Date of Birth/Sex: Treating RN: Aug 29, 1933 (86 y.o. [name] Primary Care Roshon Duell: Simona Huh Other Clinician: Referring Richanda Darin: Treating Shaivi Rothschild/Extender: Sudie Grumbling in Treatment: 2 Visit Information History Since Last Visit Added or deleted any medications: No Patient Arrived: Ambulatory Any new allergies or adverse reactions: No Arrival Time: 08:34 Had a fall or experienced change in No Accompanied By: alone activities of daily living that may affect Transfer Assistance: None risk of falls: Patient Identification Verified: Yes Signs or symptoms of abuse/neglect since last visito No Secondary Verification Process Completed: Yes Hospitalized since last visit: No Patient Requires Transmission-Based Precautions: No Implantable device outside of the clinic excluding No Patient Has Alerts: Yes cellular tissue based products placed in the center Patient Alerts: L ABI: 1.19 since last visit: Has Dressing in Place as Prescribed: Yes Pain Present Now: No Electronic Signature(s) Signed: 03/03/2020 12:46:48 PM By: Levan Hurst RN, BSN Entered By: Levan Hurst on 03/03/2020 08:34:31 -------------------------------------------------------------------------------- Clinic Level of Care Assessment Details Patient Name: Date of Service: [place], [place] 03/03/2020 8:30 A M Medical Record Number: [id number] Patient Account Number: [id number] Date of Birth/Sex: Treating RN: [birth year] (86 y.o. [name] Primary Care Fatuma Dowers: Simona Huh Other Clinician: Referring Haydin Calandra: Treating Evia Goldsmith/Extender: Sudie Grumbling in Treatment: 2 Clinic Level of Care Assessment Items TOOL 4 Quantity  Score X- 1 0 Use when only an EandM is performed on FOLLOW-UP visit ASSESSMENTS - Nursing Assessment / Reassessment X- 1 10 Reassessment of Co-morbidities (includes updates in patient status) X- 1 5 Reassessment of Adherence to Treatment Plan ASSESSMENTS - Wound and Skin A ssessment / Reassessment X - Simple Wound Assessment / Reassessment - one wound 1 5 []  - 0 Complex Wound Assessment / Reassessment - multiple wounds []  - 0 Dermatologic / Skin Assessment (not related to wound area) ASSESSMENTS - Focused Assessment []  - 0 Circumferential Edema Measurements - multi extremities []  - 0 Nutritional Assessment / Counseling / Intervention []  - 0 Lower Extremity Assessment (monofilament, tuning fork, pulses) []  - 0 Peripheral Arterial Disease Assessment (using hand held doppler) ASSESSMENTS - Ostomy and/or Continence Assessment and Care []  - 0 Incontinence Assessment and Management []  - 0 Ostomy Care Assessment and Management (repouching, etc.) PROCESS - Coordination of Care X - Simple Patient / Family Education for ongoing care 1 15 []  - 0 Complex (extensive) Patient / Family Education for ongoing care X- 1 10 Staff obtains Consents, Records, T Results / Process Orders est []  - 0 Staff telephones HHA, Nursing Homes / Clarify orders / etc []  - 0 Routine Transfer to another Facility (non-emergent condition) []  - 0 Routine Hospital Admission (non-emergent condition) []  - 0 New Admissions / Biomedical engineer / Ordering NPWT Apligraf, etc. , []  - 0 Emergency Hospital Admission (emergent condition) X- 1 10 Simple Discharge Coordination []  - 0 Complex (extensive) Discharge Coordination PROCESS - Special Needs []  - 0 Pediatric / Minor Patient Management []  - 0 Isolation Patient Management []  - 0 Hearing / Language / Visual special needs []  - 0 Assessment of Community assistance (transportation, D/C planning, etc.) []  - 0 Additional assistance / Altered  mentation []  - 0 Support Surface(s) Assessment (bed, cushion, seat, etc.) INTERVENTIONS - Wound Cleansing / Measurement X - Simple Wound Cleansing -  one wound 1 5 []  - 0 Complex Wound Cleansing - multiple wounds []  - 0 Wound Imaging (photographs - any number of wounds) []  - 0 Wound Tracing (instead of photographs) X- 1 5 Simple Wound Measurement - one wound []  - 0 Complex Wound Measurement - multiple wounds INTERVENTIONS - Wound Dressings X - Small Wound Dressing one or multiple wounds 1 10 []  - 0 Medium Wound Dressing one or multiple wounds []  - 0 Large Wound Dressing one or multiple wounds []  - 0 Application of Medications - topical []  - 0 Application of Medications - injection INTERVENTIONS - Miscellaneous []  - 0 External ear exam []  - 0 Specimen Collection (cultures, biopsies, blood, body fluids, etc.) []  - 0 Specimen(s) / Culture(s) sent or taken to Lab for analysis []  - 0 Patient Transfer (multiple staff / Civil Service fast streamer / Similar devices) []  - 0 Simple Staple / Suture removal (25 or less) []  - 0 Complex Staple / Suture removal (26 or more) []  - 0 Hypo / Hyperglycemic Management (close monitor of Blood Glucose) []  - 0 Ankle / Brachial Index (ABI) - do not check if billed separately X- 1 5 Vital Signs Has the patient been seen at the hospital within the last three years: Yes Total Score: 80 Level Of Care: New/Established - Level 3 Electronic Signature(s) Signed: 03/03/2020 12:46:48 PM By: Levan Hurst RN, BSN Entered By: Levan Hurst on 03/03/2020 08:40:51 -------------------------------------------------------------------------------- Encounter Discharge Information Details Patient Name: Date of Service: [place], [place] 03/03/2020 8:30 A M Medical Record Number: [id number] Patient Account Number: [id number] Date of Birth/Sex: Treating RN: September 27, 1933 (86 y.o. [name] Primary Care Harrold Fitchett: Simona Huh Other Clinician: Referring  Nasira Janusz: Treating Gwenivere Hiraldo/Extender: Sudie Grumbling in Treatment: 2 Encounter Discharge Information Items Discharge Condition: Stable Ambulatory Status: Ambulatory Discharge Destination: Home Transportation: Private Auto Accompanied By: alone Schedule Follow-up Appointment: Yes Clinical Summary of Care: Patient Declined Electronic Signature(s) Signed: 03/03/2020 12:46:48 PM By: Levan Hurst RN, BSN Entered By: Levan Hurst on 03/03/2020 08:41:38 -------------------------------------------------------------------------------- Wound Assessment Details Patient Name: Date of Service: [place], [place] 03/03/2020 8:30 A M Medical Record Number: [id number] Patient Account Number: [id number] Date of Birth/Sex: Treating RN: 09-14-1933 (86 y.o. [name] Primary Care Ataya Murdy: Simona Huh Other Clinician: Referring Kendyll Huettner: Treating Neal Trulson/Extender: Sudie Grumbling in Treatment: 2 Wound Status Wound Number: 5 Primary Etiology: Skin Tear Wound Location: Left, Anterior Lower Leg Wound Status: Open Wounding Event: Trauma Comorbid History: Cataracts, Hypertension, Raynauds, Osteoarthritis Date Acquired: 02/11/2020 Weeks Of Treatment: 2 Clustered Wound: No Wound Measurements Length: (cm) 2.2 Width: (cm) 2 Depth: (cm) 0.1 Area: (cm) 3.456 Volume: (cm) 0.346 Wound Description Classification: Partial Thickness Wound Margin: Flat and Intact Exudate Amount: Medium Exudate Type: Serosanguineous Exudate Color: red, brown Foul Odor After Cleansing: Slough/Fibrino % Reduction in Area: 0% % Reduction in Volume: 0% Epithelialization: Small (1-33%) Tunneling: No Undermining: No No No Wound Bed Granulation Amount: Large (67-100%) Exposed Structure Granulation Quality: Red Fascia Exposed: No Necrotic Amount: None Present (0%) Fat Layer (Subcutaneous Tissue) Exposed: No Tendon Exposed: No Muscle Exposed: No Joint  Exposed: No Bone Exposed: No Treatment Notes Wound #5 (Left, Anterior Lower Leg) 1. Cleanse With Wound Cleanser 3. Primary Dressing Applied Xeroform Gauze 4. Secondary Dressing Foam Border Dressing Electronic Signature(s) Signed: 03/03/2020 12:46:48 PM By: Levan Hurst RN, BSN Entered By: Levan Hurst on 03/03/2020 08:40:22 -------------------------------------------------------------------------------- [place] Details Patient Name: Date of Service: [place], [place] 03/03/2020  8:30 A M Medical Record Number: [id number] Patient Account Number: [id number] Date of Birth/Sex: Treating RN: [birth year] (86 y.o. [name] Primary Care Cedrica Brune: Simona Huh Other Clinician: Referring Haizel Gatchell: Treating Nirvi Boehler/Extender: Sudie Grumbling in Treatment: 2 Vital Signs Time Taken: 08:36 Temperature (F): 98.0 Height (in): 62 Pulse (bpm): 49 Weight (lbs): 105 Respiratory Rate (breaths/min): 18 Body Mass Index (BMI): 19.2 Blood Pressure (mmHg): 141/49 Reference Range: 80 - 120 mg / dl Electronic Signature(s) Signed: 03/03/2020 12:46:48 PM By: Levan Hurst RN, BSN Entered By: Levan Hurst on 03/03/2020 08:36:45

## 2020-03-10 ENCOUNTER — Encounter (HOSPITAL_BASED_OUTPATIENT_CLINIC_OR_DEPARTMENT_OTHER): Payer: PPO | Admitting: Physician Assistant

## 2020-03-10 DIAGNOSIS — I10 Essential (primary) hypertension: Secondary | ICD-10-CM | POA: Diagnosis not present

## 2020-03-10 DIAGNOSIS — L97822 Non-pressure chronic ulcer of other part of left lower leg with fat layer exposed: Secondary | ICD-10-CM | POA: Diagnosis not present

## 2020-03-10 DIAGNOSIS — S81802A Unspecified open wound, left lower leg, initial encounter: Secondary | ICD-10-CM | POA: Diagnosis not present

## 2020-03-10 NOTE — Progress Notes (Signed)
[name] ([id number]) Visit Report for 03/10/2020 Arrival Information Details Patient Name: Date of Service: [place], [place] 03/10/2020 10:30 A M Medical Record Number: [id number] Patient Account Number: [id number] Date of Birth/Sex: Treating RN: Mar 11, 1933 (86 y.o. [name] Primary Care [name]: Simona Huh Other Clinician: Referring [name]: Treating [name]/Extender: Sudie Grumbling in Treatment: 3 Visit Information History Since Last Visit Added or deleted any medications: No Patient Arrived: Ambulatory Any new allergies or adverse reactions: No Arrival Time: 11:16 Had a fall or experienced change in No Accompanied By: self activities of daily living that may affect Transfer Assistance: None risk of falls: Patient Identification Verified: Yes Signs or symptoms of abuse/neglect since last visito No Secondary Verification Process Completed: Yes Hospitalized since last visit: No Patient Requires Transmission-Based Precautions: No Implantable device outside of the clinic excluding No Patient Has Alerts: Yes cellular tissue based products placed in the center Patient Alerts: L ABI: 1.19 since last visit: Has Dressing in Place as Prescribed: Yes Pain Present Now: No Electronic Signature(s) Signed: 03/10/2020 1:35:02 PM By: Deon Pilling Entered By: Deon Pilling on 03/10/2020 11:16:52 -------------------------------------------------------------------------------- Clinic Level of Care Assessment Details Patient Name: Date of Service: [place], [place] 03/10/2020 10:30 A M Medical Record Number: [id number] Patient Account Number: [id number] Date of Birth/Sex: Treating RN: Aug 17, 1933 (86 y.o. [name] Primary Care [name]: Simona Huh Other Clinician: Referring [name]: Treating [name]/Extender: Sudie Grumbling in Treatment: 3 Clinic Level of Care Assessment Items TOOL 4 Quantity Score []  -  0 Use when only an EandM is performed on FOLLOW-UP visit ASSESSMENTS - Nursing Assessment / Reassessment X- 1 10 Reassessment of Co-morbidities (includes updates in patient status) X- 1 5 Reassessment of Adherence to Treatment Plan ASSESSMENTS - Wound and Skin A ssessment / Reassessment X - Simple Wound Assessment / Reassessment - one wound 1 5 []  - 0 Complex Wound Assessment / Reassessment - multiple wounds []  - 0 Dermatologic / Skin Assessment (not related to wound area) ASSESSMENTS - Focused Assessment []  - 0 Circumferential Edema Measurements - multi extremities []  - 0 Nutritional Assessment / Counseling / Intervention []  - 0 Lower Extremity Assessment (monofilament, tuning fork, pulses) []  - 0 Peripheral Arterial Disease Assessment (using hand held doppler) ASSESSMENTS - Ostomy and/or Continence Assessment and Care []  - 0 Incontinence Assessment and Management []  - 0 Ostomy Care Assessment and Management (repouching, etc.) PROCESS - Coordination of Care X - Simple Patient / Family Education for ongoing care 1 15 []  - 0 Complex (extensive) Patient / Family Education for ongoing care X- 1 10 Staff obtains Programmer, systems, Records, T Results / Process Orders est []  - 0 Staff telephones HHA, Nursing Homes / Clarify orders / etc []  - 0 Routine Transfer to another Facility (non-emergent condition) []  - 0 Routine Hospital Admission (non-emergent condition) []  - 0 New Admissions / Biomedical engineer / Ordering NPWT Apligraf, etc. , []  - 0 Emergency Hospital Admission (emergent condition) X- 1 10 Simple Discharge Coordination []  - 0 Complex (extensive) Discharge Coordination PROCESS - Special Needs []  - 0 Pediatric / Minor Patient Management []  - 0 Isolation Patient Management []  - 0 Hearing / Language / Visual special needs []  - 0 Assessment of Community assistance (transportation, D/C planning, etc.) []  - 0 Additional assistance / Altered mentation []  -  0 Support Surface(s) Assessment (bed, cushion, seat, etc.) INTERVENTIONS - Wound Cleansing / Measurement X - Simple Wound Cleansing - one wound  1 5 []  - 0 Complex Wound Cleansing - multiple wounds X- 1 5 Wound Imaging (photographs - any number of wounds) []  - 0 Wound Tracing (instead of photographs) []  - 0 Simple Wound Measurement - one wound []  - 0 Complex Wound Measurement - multiple wounds INTERVENTIONS - Wound Dressings X - Small Wound Dressing one or multiple wounds 1 10 []  - 0 Medium Wound Dressing one or multiple wounds []  - 0 Large Wound Dressing one or multiple wounds []  - 0 Application of Medications - topical []  - 0 Application of Medications - injection INTERVENTIONS - Miscellaneous []  - 0 External ear exam []  - 0 Specimen Collection (cultures, biopsies, blood, body fluids, etc.) []  - 0 Specimen(s) / Culture(s) sent or taken to Lab for analysis []  - 0 Patient Transfer (multiple staff / Civil Service fast streamer / Similar devices) []  - 0 Simple Staple / Suture removal (25 or less) []  - 0 Complex Staple / Suture removal (26 or more) []  - 0 Hypo / Hyperglycemic Management (close monitor of Blood Glucose) []  - 0 Ankle / Brachial Index (ABI) - do not check if billed separately X- 1 5 Vital Signs Has the patient been seen at the hospital within the last three years: Yes Total Score: 80 Level Of Care: New/Established - Level 3 Electronic Signature(s) Signed: 03/10/2020 2:12:16 PM By: Baruch Gouty RN, BSN Entered By: Baruch Gouty on 03/10/2020 11:25:11 -------------------------------------------------------------------------------- Encounter Discharge Information Details Patient Name: Date of Service: [place], [place] 03/10/2020 10:30 A M Medical Record Number: [id number] Patient Account Number: [id number] Date of Birth/Sex: Treating RN: 11-Nov-1932 (86 y.o. [name] Primary Care Zeyad Delaguila: Simona Huh Other Clinician: Referring [name]: Treating  [name]/Extender: Sudie Grumbling in Treatment: 3 Encounter Discharge Information Items Discharge Condition: Stable Ambulatory Status: Ambulatory Discharge Destination: Home Transportation: Private Auto Accompanied By: self Schedule Follow-up Appointment: Yes Clinical Summary of Care: Patient Declined Electronic Signature(s) Signed: 03/10/2020 2:12:16 PM By: Baruch Gouty RN, BSN Entered By: Baruch Gouty on 03/10/2020 11:27:41 -------------------------------------------------------------------------------- Lower Extremity Assessment Details Patient Name: Date of Service: [place], [place] 03/10/2020 10:30 A M Medical Record Number: [id number] Patient Account Number: [id number] Date of Birth/Sex: Treating RN: 1933-03-27 (86 y.o. [name] Primary Care Roselene Gray: Simona Huh Other Clinician: Referring Mykael Batz: Treating Mirtha Jain/Extender: Sudie Grumbling in Treatment: 3 Edema Assessment Assessed: [Left: Yes] [Right: No] Edema: [Left: N] [Right: o] Calf Left: Right: Point of Measurement: cm From Medial Instep 30.5 cm cm Ankle Left: Right: Point of Measurement: cm From Medial Instep 17 cm cm Vascular Assessment Pulses: Dorsalis Pedis Palpable: [Left:Yes] Electronic Signature(s) Signed: 03/10/2020 1:35:02 PM By: Deon Pilling Entered By: Deon Pilling on 03/10/2020 11:18:33 -------------------------------------------------------------------------------- [place] Details Patient Name: Date of Service: [place], [place] 03/10/2020 10:30 A M Medical Record Number: [id number] Patient Account Number: [id number] Date of Birth/Sex: Treating RN: 21-Mar-1933 (86 y.o. [name] Primary Care Ralf Konop[place]: Simona Huh Other Clinician: Referring Ethelwyn Gilbertson: Treating Arnol Mcgibbon/Extender: Sudie Grumbling in Treatment: 3 Active Inactive Electronic Signature(s) Signed:  03/10/2020 2:12:16 PM By: Baruch Gouty RN, BSN Entered By: Baruch Gouty on 03/10/2020 11:23:55 -------------------------------------------------------------------------------- Pain Assessment Details Patient Name: Date of Service: [place], [place] 03/10/2020 10:30 A M Medical Record Number: [id number] Patient Account Number: [id number] Date of Birth/Sex: Treating RN: 10/11/1933 (86 y.o. [name] Primary Care Tahjanae Blankenburg: Simona Huh Other Clinician: Referring Johnhenry Tippin: Treating Cendy Oconnor/Extender: Tamera Reason,  Modena Jansky Weeks in Treatment: 3 Active Problems Location of Pain Severity and Description of Pain Patient Has Paino No Site Locations Rate the pain. Current Pain Level: 0 Pain Management and Medication Current Pain Management: Medication: No Cold Application: No Rest: No Massage: No Activity: No T.E.N.S.: No Heat Application: No Leg drop or elevation: No Is the Current Pain Management Adequate: Adequate How does your wound impact your activities of daily livingo Sleep: No Bathing: No Appetite: No Relationship With Others: No Bladder Continence: No Emotions: No Bowel Continence: No Work: No Toileting: No Drive: No Dressing: No Hobbies: No Electronic Signature(s) Signed: 03/10/2020 1:35:02 PM By: Deon Pilling Entered By: Deon Pilling on 03/10/2020 11:18:22 -------------------------------------------------------------------------------- Patient/Caregiver Education Details Patient Name: Date of Service: [place] 5/26/2021andnbsp10:30 A M Medical Record Number: [id number] Patient Account Number: [id number] Date of Birth/Gender: Treating RN: 1933-05-26 (86 y.o. [name] Primary Care Physician: Simona Huh Other Clinician: Referring Physician: Treating Physician/Extender: Sudie Grumbling in Treatment: 3 Education Assessment Education Provided To: Patient Education Topics  Provided Wound/Skin Impairment: Methods: Explain/Verbal Responses: Reinforcements needed, State content correctly Electronic Signature(s) Signed: 03/10/2020 2:12:16 PM By: Baruch Gouty RN, BSN Entered By: Baruch Gouty on 03/10/2020 11:24:09 -------------------------------------------------------------------------------- Wound Assessment Details Patient Name: Date of Service: [place], [place] 03/10/2020 10:30 A M Medical Record Number: [id number] Patient Account Number: [id number] Date of Birth/Sex: Treating RN: [birth year] (86 y.o. [name] Primary Care Hikaru Delorenzo: Simona Huh Other Clinician: Referring Kyrstyn Greear: Treating Harlen Danford/Extender: Sudie Grumbling in Treatment: 3 Wound Status Wound Number: 5 Primary Etiology: Skin Tear Wound Location: Left, Anterior Lower Leg Wound Status: Healed - Epithelialized Wounding Event: Trauma Comorbid History: Cataracts, Hypertension, Raynauds, Osteoarthritis Date Acquired: 02/11/2020 Weeks Of Treatment: 3 Clustered Wound: No Wound Measurements Length: (cm) Width: (cm) Depth: (cm) Area: (cm) Volume: (cm) 0 % Reduction in Area: 100% 0 % Reduction in Volume: 100% 0 Epithelialization: Large (67-100%) 0 Tunneling: No 0 Undermining: No Wound Description Classification: Partial Thickness Wound Margin: Flat and Intact Exudate Amount: None Present Foul Odor After Cleansing: No Slough/Fibrino No Wound Bed Granulation Amount: None Present (0%) Exposed Structure Necrotic Amount: None Present (0%) Fascia Exposed: No Fat Layer (Subcutaneous Tissue) Exposed: No Tendon Exposed: No Muscle Exposed: No Joint Exposed: No Bone Exposed: No Electronic Signature(s) Signed: 03/10/2020 2:12:16 PM By: Baruch Gouty RN, BSN Entered By: Baruch Gouty on 03/10/2020 11:25:38 -------------------------------------------------------------------------------- [place] Details Patient Name: Date of  Service: [place], [place] 03/10/2020 10:30 A M Medical Record Number: [id number] Patient Account Number: [id number] Date of Birth/Sex: Treating RN: 1933-09-13 (86 y.o. Helene Shoe, Meta.Reding Primary Care Stevin Bielinski: Simona Huh Other Clinician: Referring Ameliah Baskins: Treating Pretty Weltman/Extender: Sudie Grumbling in Treatment: 3 Vital Signs Time Taken: 11:16 Temperature (F): 98.1 Height (in): 62 Pulse (bpm): 49 Weight (lbs): 105 Respiratory Rate (breaths/min): 18 Body Mass Index (BMI): 19.2 Blood Pressure (mmHg): 135/43 Reference Range: 80 - 120 mg / dl Electronic Signature(s) Signed: 03/10/2020 1:35:02 PM By: Deon Pilling Entered By: Deon Pilling on 03/10/2020 11:17:49

## 2020-03-10 NOTE — Progress Notes (Addendum)
DELONA, HANEY ([id number]) Visit Report for 03/10/2020 Chief Complaint Document Details Patient Name: Date of Service: [place], [place] 03/10/2020 10:30 A M Medical Record Number: [id number] Patient Account Number: [id number] Date of Birth/Sex: Treating RN: 02/07/1933 (86 y.o. [name] Primary Care Provider: Simona Huh Other Clinician: Referring Provider: Treating Provider/Extender: Sudie Grumbling in Treatment: 3 Information Obtained from: Patient Chief Complaint Left LE Ulcer/Skin Tear Electronic Signature(s) Signed: 03/10/2020 10:59:38 AM By: Worthy Keeler PA-C Entered By: Worthy Keeler on 03/10/2020 10:59:37 -------------------------------------------------------------------------------- HPI Details Patient Name: Date of Service: [place], [place] 03/10/2020 10:30 A M Medical Record Number: [id number] Patient Account Number: [id number] Date of Birth/Sex: Treating RN: 02-Dec-1932 (86 y.o. [name] Primary Care Provider: Simona Huh Other Clinician: Referring Provider: Treating Provider/Extender: Sudie Grumbling in Treatment: 3 History of Present Illness HPI Description: READMISSION 07/15/2019 Patient is now an 86 year old woman who was previously here in 2016 and 2018. Cared for at the time by Dr. Con Memos. Both times with wounds related to trauma in the left leg. She was felt to have underlying venous insufficiency although both occasions were related to trauma. The patient tells me that 2 weeks ago she hit her lateral left leg on the car door. This was a skin tear with a flap for a period of time she has been using Polysporin. The flap came off recently she has a clean looking superficial wound on the left lateral leg. Our intake nurse reported some drainage. Past medical history; includes sensorineural hearing loss, osteoarthritis, hypertension and a hammertoe on the right foot for which she follows with  podiatry. ABIs in our clinic were 1.19 on the left 07/21/2019; the patient did not like the wraps. They slid down and rub the wound the wound is measuring larger she is upset. 10/12; left lateral leg wound. Most of this looks healthy even under illumination. We have been using Hydrofera Blue under border foam. She would not allow compression 10/19; left lateral leg wound. 2 small open areas remain of the original wound. We have been using Hydrofera Blue under border foam. This seems to be making decent progress 10/26 left lateral leg traumatic wound. There is no open wound remaining. We have been using Hydrofera Blue under border foam she arrived with some denuded epithelium that I removed there is no open wound remaining. Is fairly clear she has some degree of chronic venous insufficiency with not a lot of edema but dilated veins in her feet. She reminds me that she also has reactive arthritis and was on prednisone for a prolonged period of time. Nevertheless I think it would be beneficial for her to at least wear support stockings but right now I do not think she is going to agree to the Readmission: 02/18/2020 upon evaluation today patient has sustained a skin tear which occurred about 1 week ago she tells me. Fortunately there does not appear to be any signs of active infection at this time which is excellent news. She has been tolerating the dressing changes without complication. Overall very pleased with where things stand at this point. The only issue that I see here is that the skin flap somewhat folded back and then reattached further down causing an area that is actually bunched up to form where it closed on itself but then reattached on the end of the tissue. Nonetheless I think we have to trim off the bunched up tissue  in order to allow this to heal appropriately. That is the only issue I really see today. 03/10/2020 upon evaluation today patient actually appears to be doing excellent at  this time. She has healed quite nicely and has been a couple of weeks since I saw her. Overall I feel like she is completely healed and ready for discharge as of today. Electronic Signature(s) Signed: 03/10/2020 11:25:52 AM By: Worthy Keeler PA-C Entered By: Worthy Keeler on 03/10/2020 11:25:50 -------------------------------------------------------------------------------- Physical Exam Details Patient Name: Date of Service: [place], [place] 03/10/2020 10:30 A M Medical Record Number: [id number] Patient Account Number: [id number] Date of Birth/Sex: Treating RN: 08-05-1933 (86 y.o. [name] Primary Care Provider: Simona Huh Other Clinician: Referring Provider: Treating Provider/Extender: Sudie Grumbling in Treatment: 3 Constitutional Well-nourished and well-hydrated in no acute distress. Respiratory normal breathing without difficulty. Psychiatric this patient is able to make decisions and demonstrates good insight into disease process. Alert and Oriented x 3. pleasant and cooperative. Notes Patient's wound again showed signs of complete epithelization. I think that she may need a protective dressing over top of this but I do not believe she is going require anything further based on what I am seeing. Overall she is very pleased she is having no pain. Electronic Signature(s) Signed: 03/10/2020 11:26:07 AM By: Worthy Keeler PA-C Entered By: Worthy Keeler on 03/10/2020 11:26:06 -------------------------------------------------------------------------------- Physician Orders Details Patient Name: Date of Service: [place], [place] 03/10/2020 10:30 A M Medical Record Number: [id number] Patient Account Number: [id number] Date of Birth/Sex: Treating RN: 1932-11-06 (86 y.o. [name] Primary Care Provider: Simona Huh Other Clinician: Referring Provider: Treating Provider/Extender: Sudie Grumbling in  Treatment: 3 Verbal / Phone Orders: No Diagnosis Coding ICD-10 Coding Code Description [phone] Unspecified open wound, left lower leg, initial encounter L97.822 Non-pressure chronic ulcer of other part of left lower leg with fat layer exposed I10 Essential (primary) hypertension Discharge From [facility] Services Discharge from [place] Signature(s) Signed: 03/10/2020 1:28:30 PM By: Worthy Keeler PA-C Signed: 03/10/2020 2:12:16 PM By: Baruch Gouty RN, BSN Entered By: Baruch Gouty on 03/10/2020 11:26:15 -------------------------------------------------------------------------------- Problem List Details Patient Name: Date of Service: [place], [place] 03/10/2020 10:30 A M Medical Record Number: [id number] Patient Account Number: [id number] Date of Birth/Sex: Treating RN: 1933/07/03 (86 y.o. [name] Primary Care Provider: Simona Huh Other Clinician: Referring Provider: Treating Provider/Extender: Sudie Grumbling in Treatment: 3 Active Problems ICD-10 Encounter Code Description Active Date MDM Diagnosis S81.802A Unspecified open wound, left lower leg, initial encounter 02/18/2020 No Yes L97.822 Non-pressure chronic ulcer of other part of left lower leg with fat layer exposed5/02/2020 No Yes I10 Essential (primary) hypertension 02/18/2020 No Yes Inactive Problems Resolved Problems Electronic Signature(s) Signed: 03/10/2020 10:59:26 AM By: Worthy Keeler PA-C Entered By: Worthy Keeler on 03/10/2020 10:59:25 -------------------------------------------------------------------------------- Progress Note Details Patient Name: Date of Service: [place], [place] 03/10/2020 10:30 A M Medical Record Number: [id number] Patient Account Number: [id number] Date of Birth/Sex: Treating RN: 05-11-1933 (86 y.o. [name] Primary Care Provider: Simona Huh Other Clinician: Referring Provider: Treating Provider/Extender:  Sudie Grumbling in Treatment: 3 Subjective Chief Complaint Information obtained from Patient Left LE Ulcer/Skin Tear History of Present Illness (HPI) READMISSION 07/15/2019 Patient is now an 86 year old woman who was previously here in 2016 and 2018. Cared for at the  time by Dr. Con Memos. Both times with wounds related to trauma in the left leg. She was felt to have underlying venous insufficiency although both occasions were related to trauma. The patient tells me that 2 weeks ago she hit her lateral left leg on the car door. This was a skin tear with a flap for a period of time she has been using Polysporin. The flap came off recently she has a clean looking superficial wound on the left lateral leg. Our intake nurse reported some drainage. Past medical history; includes sensorineural hearing loss, osteoarthritis, hypertension and a hammertoe on the right foot for which she follows with podiatry. ABIs in our clinic were 1.19 on the left 07/21/2019; the patient did not like the wraps. They slid down and rub the wound the wound is measuring larger she is upset. 10/12; left lateral leg wound. Most of this looks healthy even under illumination. We have been using Hydrofera Blue under border foam. She would not allow compression 10/19; left lateral leg wound. 2 small open areas remain of the original wound. We have been using Hydrofera Blue under border foam. This seems to be making decent progress 10/26 left lateral leg traumatic wound. There is no open wound remaining. We have been using Hydrofera Blue under border foam she arrived with some denuded epithelium that I removed there is no open wound remaining. Is fairly clear she has some degree of chronic venous insufficiency with not a lot of edema but dilated veins in her feet. She reminds me that she also has reactive arthritis and was on prednisone for a prolonged period of time. Nevertheless I think it would be  beneficial for her to at least wear support stockings but right now I do not think she is going to agree to the Readmission: 02/18/2020 upon evaluation today patient has sustained a skin tear which occurred about 1 week ago she tells me. Fortunately there does not appear to be any signs of active infection at this time which is excellent news. She has been tolerating the dressing changes without complication. Overall very pleased with where things stand at this point. The only issue that I see here is that the skin flap somewhat folded back and then reattached further down causing an area that is actually bunched up to form where it closed on itself but then reattached on the end of the tissue. Nonetheless I think we have to trim off the bunched up tissue in order to allow this to heal appropriately. That is the only issue I really see today. 03/10/2020 upon evaluation today patient actually appears to be doing excellent at this time. She has healed quite nicely and has been a couple of weeks since I saw her. Overall I feel like she is completely healed and ready for discharge as of today. Objective Constitutional Well-nourished and well-hydrated in no acute distress. Vitals Time Taken: 11:16 AM, Height: 62 in, Weight: 105 lbs, BMI: 19.2, Temperature: 98.1 F, Pulse: 49 bpm, Respiratory Rate: 18 breaths/min, Blood Pressure: 135/43 mmHg. Respiratory normal breathing without difficulty. Psychiatric this patient is able to make decisions and demonstrates good insight into disease process. Alert and Oriented x 3. pleasant and cooperative. General Notes: Patient's wound again showed signs of complete epithelization. I think that she may need a protective dressing over top of this but I do not believe she is going require anything further based on what I am seeing. Overall she is very pleased she is having no pain. Integumentary (Hair,  Skin) Wound #5 status is Healed - Epithelialized. Original cause of  wound was Trauma. The wound is located on the Left,Anterior Lower Leg. The wound measures 0cm length x 0cm width x 0cm depth; 0cm^2 area and 0cm^3 volume. There is no tunneling or undermining noted. There is a none present amount of drainage noted. The wound margin is flat and intact. There is no granulation within the wound bed. There is no necrotic tissue within the wound bed. Assessment Active Problems ICD-10 Unspecified open wound, left lower leg, initial encounter Non-pressure chronic ulcer of other part of left lower leg with fat layer exposed Essential (primary) hypertension Plan Discharge From [facility] Services: Discharge from [place] 1. I would recommend discontinuation of wound care services at this point. 2. I would recommend a protective dressing just to ensure that nothing causes any breakdown or injury if she happened to bump the leg where this new skin is in the meantime. Otherwise I think is just a matter of letting this I will toughen up. We will see patient back for reevaluation in 1 week here in the clinic. If anything worsens or changes patient will contact our office for additional recommendations. Electronic Signature(s) Signed: 03/10/2020 11:27:11 AM By: Worthy Keeler PA-C Entered By: Worthy Keeler on 03/10/2020 11:27:11 -------------------------------------------------------------------------------- SuperBill Details Patient Name: Date of Service: [place], [place] 03/10/2020 Medical Record Number: [id number] Patient Account Number: [id number] Date of Birth/Sex: Treating RN: Mar 09, 1933 (86 y.o. [name] Primary Care Provider: Simona Huh Other Clinician: Referring Provider: Treating Provider/Extender: Sudie Grumbling in Treatment: 3 Diagnosis Coding ICD-10 Codes Code Description [phone] Unspecified open wound, left lower leg, initial encounter L97.822 Non-pressure chronic ulcer of other part of left lower leg with  fat layer exposed [place] (primary) hypertension Facility Procedures CPT4 Code: [id number] Description: 99213 - WOUND CARE VISIT-LEV 3 EST PT Modifier: Quantity: 1 Physician Procedures : CPT4 Code Description Modifier [id number] - WC PHYS LEVEL 3 - EST PT ICD-10 Diagnosis Description L97.822 Non-pressure chronic ulcer of other part of left lower leg with fat layer exposed S81.802A Unspecified open wound, left lower leg, initial  encounter I10 Essential (primary) hypertension Quantity: 1 Electronic Signature(s) Signed: 03/10/2020 11:26:54 AM By: Worthy Keeler PA-C Entered By: Worthy Keeler on 03/10/2020 11:26:53

## 2020-03-10 NOTE — Progress Notes (Signed)
LAQUESHIA, REISH ([id number]) Visit Report for 03/03/2020 SuperBill Details Patient Name: Date of Service: [place], [place] 03/03/2020 Medical Record Number: [id number] Patient Account Number: [id number] Date of Birth/Sex: Treating RN: [birth year] (86 y.o. [name] Primary Care Provider: Simona Huh Other Clinician: Referring Provider: Treating Provider/Extender: Sudie Grumbling in Treatment: 2 Diagnosis Coding ICD-10 Codes Code Description [phone] Unspecified open wound, left lower leg, initial encounter L97.822 Non-pressure chronic ulcer of other part of left lower leg with fat layer exposed [place] (primary) hypertension Facility Procedures CPT4 Code Description Modifier Quantity [id number] - WOUND CARE VISIT-LEV 3 EST PT 1 Electronic Signature(s) Signed: 03/03/2020 12:46:48 PM By: Levan Hurst RN, BSN Signed: 03/10/2020 12:40:59 PM By: Worthy Keeler PA-C Entered By: Levan Hurst on 03/03/2020 08:41:45

## 2020-03-11 DIAGNOSIS — M459 Ankylosing spondylitis of unspecified sites in spine: Secondary | ICD-10-CM | POA: Diagnosis not present

## 2020-03-25 ENCOUNTER — Telehealth: Payer: Self-pay

## 2020-03-25 DIAGNOSIS — N952 Postmenopausal atrophic vaginitis: Secondary | ICD-10-CM

## 2020-03-25 NOTE — Telephone Encounter (Signed)
Medication refill request: Premarin Last AEX:  08/20/19 DL Next AEX: 10/26/20 with Dr. Quincy Simmonds Last MMG (if hormonal medication request): 10/29/19 BIRADS 1 negative/density b Refill authorized: Please advise on refill

## 2020-03-25 NOTE — Telephone Encounter (Signed)
Patient is calling in regards to refill for premarin.

## 2020-03-26 MED ORDER — PREMARIN 0.625 MG/GM VA CREA: TOPICAL_CREAM | VAGINAL | 0 refills | Status: DC

## 2020-03-31 ENCOUNTER — Telehealth: Payer: Self-pay | Admitting: *Deleted

## 2020-03-31 NOTE — Telephone Encounter (Signed)
Please contact the pharmacy to get more information about this prescription. I just refilled the vaginal estrogen a few days ago for her.

## 2020-03-31 NOTE — Telephone Encounter (Signed)
Incoming fax received from patient's pharmacy in regards to premarin vaginal cream stating, "medication is unavailable to order at this time."   Routing to provider to review and advise.

## 2020-04-01 NOTE — Telephone Encounter (Signed)
Call placed to [facility]. Confirmed Premarin vaginal cream Rx on file, medication is on back order until 05/14/20. Does patient want an alternative? Advised RN will f/u with patient.

## 2020-04-01 NOTE — Telephone Encounter (Signed)
Spoke with patient. Advised as seen below per pharmacy. Patient is about out of medication, will need refill prior to 7/30.   Reviewed option of patient checking with other retail pharmacies for availability and transferring current Rx or sending in an alternative medication. Patient is going to check with local pharmacies for availability first, will return call to office if any additional assistance is needed. Questions answered.   Routing to provider for final review. Patient is agreeable to disposition. Will close encounter.

## 2020-04-22 DIAGNOSIS — M459 Ankylosing spondylitis of unspecified sites in spine: Secondary | ICD-10-CM | POA: Diagnosis not present

## 2020-04-27 DIAGNOSIS — L820 Inflamed seborrheic keratosis: Secondary | ICD-10-CM | POA: Diagnosis not present

## 2020-04-27 DIAGNOSIS — Z85828 Personal history of other malignant neoplasm of skin: Secondary | ICD-10-CM | POA: Diagnosis not present

## 2020-04-27 DIAGNOSIS — D225 Melanocytic nevi of trunk: Secondary | ICD-10-CM | POA: Diagnosis not present

## 2020-04-27 DIAGNOSIS — L570 Actinic keratosis: Secondary | ICD-10-CM | POA: Diagnosis not present

## 2020-05-10 ENCOUNTER — Other Ambulatory Visit: Payer: Self-pay | Admitting: Obstetrics and Gynecology

## 2020-05-10 DIAGNOSIS — N952 Postmenopausal atrophic vaginitis: Secondary | ICD-10-CM

## 2020-05-10 NOTE — Telephone Encounter (Signed)
Medication refill request: Premarin 30g Last AEX:  08/20/19 Next AEX: 10/26/20 Last MMG (if hormonal medication request): 10/29/19  Normal  Refill authorized: 30g/0

## 2020-05-19 DIAGNOSIS — M150 Primary generalized (osteo)arthritis: Secondary | ICD-10-CM | POA: Diagnosis not present

## 2020-05-19 DIAGNOSIS — M459 Ankylosing spondylitis of unspecified sites in spine: Secondary | ICD-10-CM | POA: Diagnosis not present

## 2020-05-19 DIAGNOSIS — M154 Erosive (osteo)arthritis: Secondary | ICD-10-CM | POA: Diagnosis not present

## 2020-05-19 DIAGNOSIS — Z6820 Body mass index (BMI) 20.0-20.9, adult: Secondary | ICD-10-CM | POA: Diagnosis not present

## 2020-05-19 DIAGNOSIS — Z1589 Genetic susceptibility to other disease: Secondary | ICD-10-CM | POA: Diagnosis not present

## 2020-05-28 DIAGNOSIS — H6123 Impacted cerumen, bilateral: Secondary | ICD-10-CM | POA: Diagnosis not present

## 2020-06-03 DIAGNOSIS — M459 Ankylosing spondylitis of unspecified sites in spine: Secondary | ICD-10-CM | POA: Diagnosis not present

## 2020-06-17 DIAGNOSIS — R000 Tachycardia, unspecified: Secondary | ICD-10-CM | POA: Diagnosis not present

## 2020-06-17 DIAGNOSIS — I959 Hypotension, unspecified: Secondary | ICD-10-CM | POA: Diagnosis not present

## 2020-06-17 DIAGNOSIS — R42 Dizziness and giddiness: Secondary | ICD-10-CM | POA: Diagnosis not present

## 2020-06-17 DIAGNOSIS — R002 Palpitations: Secondary | ICD-10-CM | POA: Diagnosis not present

## 2020-07-02 ENCOUNTER — Telehealth: Payer: Self-pay | Admitting: Obstetrics and Gynecology

## 2020-07-02 NOTE — Telephone Encounter (Signed)
Patient is returning a call to Stephanie. °

## 2020-07-02 NOTE — Telephone Encounter (Signed)
AEX 08/2019 with DL, next scheduled 10/2020 with BS H/o cystitis  PMP, on HRT Hysterectomy   Spoke with pt. Pt states having urinary urgency, frequency and burning x 10 days. Pt states has increased water intake, used OTC motrin and has not resolved sx. Pt denies fever, chills, back pain, abd cramps, vaginal bleeding.  Pt advised to have OV for further evaluation. Pt states is unable to come today for appt. Pt advised can see PCP or UC for sx. Pt declines. Pt requesting medication to be called into pharmacy over the weekend. Pt advised we do not treat over the phone. Pt verbalized understanding. Pt has not seen another provider since [place], [place]. Pt scheduled on 9/20 at 3 pm with Dr Sabra Heck. Pt agreeable and verbalized understanding to date and time of appt.  Encounter closed.

## 2020-07-02 NOTE — Progress Notes (Signed)
GYNECOLOGY  VISIT  CC:   Vulvar skin burning/burning with urination  HPI: 86 y.o. G38P2001 Married White or Caucasian female here for vaginal burning that has been present for about 2 weeks.  She tried vagisil and it helped but didn't last.  She typically uses premarin vaginal cream but hasn't used this for two weeks.  Wasn't sure if that was part of the problem.  Denies vaginal bleeding or hematuria.  Denies pelvic pain or low back pain.  Doesn't really have vaginal discharge.  Is on very low dosed HRT with 0.025mg  patches that she cuts in half.  Uses topical testosterone as well.  Needs RF for this.  poct urine-neg  GYNECOLOGIC HISTORY: No LMP recorded. Patient has had a hysterectomy. Contraception: hysterectomy Menopausal hormone therapy: none  Patient Active Problem List   Diagnosis Date Noted  . Presbycusis of both ears 12/02/2018  . S/P reverse total shoulder arthroplasty, left 04/09/2018  . Chronic left shoulder pain 03/20/2018  . Sensorineural hearing loss (SNHL), bilateral 01/03/2017  . Bilateral impacted cerumen 07/03/2016  . Rhinitis, chronic 07/03/2016  . H/O thrombocytosis 01/29/2012  . Neck pain, acute 08/31/2011  . Arthritis of hand 08/31/2011  . Supraventricular tachycardia ([place]) 08/31/2011  . Salmonella enteritis 08/28/2011  . Muscle spasm of back 08/28/2011  . Dehydration 08/28/2011  . GERD (gastroesophageal reflux disease) 08/28/2011  . DJD (degenerative joint disease) 08/28/2011  . Rotator cuff tear, left 08/28/2011  . ARF (acute renal failure) ([place]) 08/28/2011    Past Medical History:  Diagnosis Date  . Arthritis    "osteoarthritis"  . Diarrhea 08/28/11   "for the last 17 days; from [place] poisoning"  . GERD (gastroesophageal reflux disease)   . Headache(784.0) 08/28/11   "just for the last few days; releated to dehydration"  . Hearing aid worn   . Shortness of breath 08/28/11   "hard time breathing deeply because of the pain"    Past Surgical  History:  Procedure Laterality Date  . BACK SURGERY  1/06   "spinal stenosis"  . CATARACT EXTRACTION W/ INTRAOCULAR LENS  IMPLANT, BILATERAL  Summer 2011  . FACIAL COSMETIC SURGERY  ~ 2002  . FINGER SURGERY  ~ 2000   "titanium rod in right pointer; from arthritis"  . FOOT SURGERY Right   . SHOULDER SURGERY Left 03/2018  . TONSILLECTOMY  1943  . TUBAL LIGATION  1970's  . VAGINAL HYSTERECTOMY  2/04   "still have my ovaries"    MEDS:   Current Outpatient Medications on File Prior to Visit  Medication Sig Dispense Refill  . Acetaminophen (TYLENOL EXTRA STRENGTH PO) Take by mouth.    . B Complex Vitamins (B-COMPLEX/B-12 PO) Take by mouth daily.    . Biotin 10 MG TABS Take by mouth.    . diclofenac sodium (VOLTAREN) 1 % GEL     . estradiol (VIVELLE-DOT) 0.025 MG/24HR APPLY 1/2 PATCH EXTERNALLY TO THE SKIN 2 TIMES A WEEK 8 patch 4  . hydrochlorothiazide (MICROZIDE) 12.5 MG capsule     . hydroxychloroquine (PLAQUENIL) 200 MG tablet Take 200 mg by mouth daily.    Marland Kitchen inFLIXimab-abda (RENFLEXIS IV) Inject into the vein.    Marland Kitchen ipratropium (ATROVENT) 0.06 % nasal spray U 2 SPRAYS IEN TID PRF RHINITIS  5  . metoprolol succinate (TOPROL-XL) 25 MG 24 hr tablet TK 1 T PO ONCE A DAY FOR BP AND HEART RATE CONTROL    . OVER THE COUNTER MEDICATION Place 1 drop into both eyes 2 (two) times  daily as needed (redness/ dry eyes). Over the counter eye drops    . oxyCODONE (OXY IR/ROXICODONE) 5 MG immediate release tablet Take 5 mg by mouth at bedtime as needed.    . pantoprazole (PROTONIX) 40 MG tablet Take 40 mg by mouth daily.    . RESTASIS 0.05 % ophthalmic emulsion INT 1 GTT INTO OU BID    . triamcinolone cream (KENALOG) 0.1 % APP TO NAIL 2 TO 3 XD FOR 1 MONTH UTD    . valACYclovir (VALTREX) 1000 MG tablet as needed.     . NONFORMULARY OR COMPOUNDED ITEM TESTOSTERONE PROPIONATE 2% PETROLATUM (JAR)  APPLY ONE TEAR DROP SIZE AMOUNT TO VAGINAL AREA AT BEDTIME TWO TIMES WEEKLY. (Patient not taking: Reported on  07/05/2020) 60 each 1  . PREMARIN vaginal cream PLACE 0.5 GRAM VAGINALLY 2 TIMES A WEEK AS DIRECTED (Patient not taking: Reported on 07/05/2020) 30 g 0   No current facility-administered medications on file prior to visit.    ALLERGIES: Sulfa antibiotics  Family History  Problem Relation Age of Onset  . Cancer Father        colon    SH:  Married, non smoker  Review of Systems  Constitutional: Negative.   HENT: Negative.   Eyes: Negative.   Respiratory: Negative.   Cardiovascular: Negative.   Gastrointestinal: Negative.   Endocrine: Negative.   Genitourinary: Positive for frequency.       Vaginal burning  Musculoskeletal: Negative.   Skin: Negative.   Allergic/Immunologic: Negative.   Neurological: Negative.   Hematological: Negative.   Psychiatric/Behavioral: Negative.     PHYSICAL EXAMINATION:    BP 120/64   Pulse 68   Resp 16   Wt 107 lb (48.5 kg)   BMI 19.73 kg/m     General appearance: alert, cooperative and appears stated age Lymph:  no inguinal LAD noted  Pelvic: External genitalia:  Significant erythema of inner labia majora with a few satellite lesions              Urethra:  normal appearing urethra with no masses, tenderness or lesions              Bartholins and Skenes: normal                 Vagina: normal appearing vagina with normal color and discharge, no lesions              Cervix: absent              Bimanual Exam:  Uterus:  uterus absent              Adnexa: no mass, fullness, tenderness  Chaperone, Olene Floss, CMA, was present for exam.  Assessment: Vulvar irritation/erythema, skin burning with urination On very low dosed HRT Needs testosterone refill  Plan: Affirm obtained today RF for topical testosterone completed today.  Would recommend repeating level at AEX which is scheduled for January. Recommend pt start back using topical premarin vaginal cream, small amount externally nightly until testing is back

## 2020-07-02 NOTE — Telephone Encounter (Signed)
Left message for pt to return call to triage RN. 

## 2020-07-02 NOTE — Telephone Encounter (Signed)
Patient is having symptoms of cystitis.

## 2020-07-05 ENCOUNTER — Other Ambulatory Visit: Payer: Self-pay

## 2020-07-05 ENCOUNTER — Ambulatory Visit: Payer: PPO | Admitting: Obstetrics & Gynecology

## 2020-07-05 ENCOUNTER — Encounter: Payer: Self-pay | Admitting: Obstetrics & Gynecology

## 2020-07-05 VITALS — BP 120/64 | HR 68 | Resp 16 | Wt 107.0 lb

## 2020-07-05 DIAGNOSIS — R300 Dysuria: Secondary | ICD-10-CM

## 2020-07-05 DIAGNOSIS — N390 Urinary tract infection, site not specified: Secondary | ICD-10-CM | POA: Diagnosis not present

## 2020-07-05 DIAGNOSIS — R6882 Decreased libido: Secondary | ICD-10-CM

## 2020-07-05 DIAGNOSIS — N9089 Other specified noninflammatory disorders of vulva and perineum: Secondary | ICD-10-CM | POA: Diagnosis not present

## 2020-07-05 LAB — POCT URINALYSIS DIPSTICK
Bilirubin, UA: NEGATIVE
Blood, UA: NEGATIVE
Glucose, UA: NEGATIVE
Ketones, UA: NEGATIVE
Leukocytes, UA: NEGATIVE
Nitrite, UA: NEGATIVE
Protein, UA: NEGATIVE
Urobilinogen, UA: NEGATIVE E.U./dL — AB
pH, UA: 5 (ref 5.0–8.0)

## 2020-07-05 MED ORDER — NONFORMULARY OR COMPOUNDED ITEM: 1 refills | Status: DC

## 2020-07-06 LAB — VAGINITIS/VAGINOSIS, DNA PROBE
Candida Species: NEGATIVE
Gardnerella vaginalis: NEGATIVE
Trichomonas vaginosis: NEGATIVE

## 2020-07-08 LAB — URINE CULTURE

## 2020-07-09 ENCOUNTER — Telehealth: Payer: Self-pay

## 2020-07-09 NOTE — Telephone Encounter (Signed)
-----   Message from Megan Salon, MD sent at 07/09/2020 12:59 PM EDT ----- Please let pt know her final urine culture was negative.  Can you please get an update from her about symptoms?  Thanks.

## 2020-07-09 NOTE — Telephone Encounter (Signed)
Left message for call back.

## 2020-07-09 NOTE — Telephone Encounter (Signed)
Patient notified of results. See lab 

## 2020-07-19 DIAGNOSIS — M459 Ankylosing spondylitis of unspecified sites in spine: Secondary | ICD-10-CM | POA: Diagnosis not present

## 2020-07-22 ENCOUNTER — Other Ambulatory Visit: Payer: Self-pay

## 2020-07-22 DIAGNOSIS — N952 Postmenopausal atrophic vaginitis: Secondary | ICD-10-CM

## 2020-07-22 NOTE — Telephone Encounter (Signed)
Patient is calling in regards to refill of Premarin.

## 2020-07-22 NOTE — Telephone Encounter (Signed)
Medication refill request: Premarin Last AEX:  08/20/19 DL Next AEX: 10/26/20 Dr. Quincy Simmonds Last MMG (if hormonal medication request): 10/29/19 BIRADS 1 negative/density b Refill authorized: Today, please advise

## 2020-07-23 MED ORDER — PREMARIN 0.625 MG/GM VA CREA: TOPICAL_CREAM | VAGINAL | 0 refills | Status: DC

## 2020-08-16 ENCOUNTER — Encounter (HOSPITAL_COMMUNITY): Payer: Self-pay | Admitting: Emergency Medicine

## 2020-08-16 ENCOUNTER — Ambulatory Visit (HOSPITAL_COMMUNITY)
Admission: EM | Admit: 2020-08-16 | Discharge: 2020-08-16 | Disposition: A | Discharge status: Home / Self Care | Payer: PPO | Attending: Family Medicine | Admitting: Family Medicine

## 2020-08-16 ENCOUNTER — Other Ambulatory Visit: Payer: Self-pay

## 2020-08-16 DIAGNOSIS — T148XXA Other injury of unspecified body region, initial encounter: Secondary | ICD-10-CM

## 2020-08-16 DIAGNOSIS — L089 Local infection of the skin and subcutaneous tissue, unspecified: Secondary | ICD-10-CM

## 2020-08-16 DIAGNOSIS — Z23 Encounter for immunization: Secondary | ICD-10-CM | POA: Diagnosis not present

## 2020-08-16 MED ORDER — SILVER SULFADIAZINE 1 % EX CREA: 1.0000 "application " | TOPICAL_CREAM | Freq: Every day | CUTANEOUS | 0 refills | Status: DC

## 2020-08-16 MED ORDER — BACITRACIN ZINC 500 UNIT/GM EX OINT
TOPICAL_OINTMENT | CUTANEOUS | Status: AC
  Filled 2020-08-16: qty 28.35

## 2020-08-16 MED ORDER — DOXYCYCLINE HYCLATE 100 MG PO CAPS: 100.0000 mg | ORAL_CAPSULE | Freq: Two times a day (BID) | ORAL | 0 refills | Status: DC

## 2020-08-16 MED ORDER — TETANUS-DIPHTH-ACELL PERTUSSIS 5-2.5-18.5 LF-MCG/0.5 IM SUSY
PREFILLED_SYRINGE | INTRAMUSCULAR | Status: AC
  Filled 2020-08-16: qty 0.5

## 2020-08-16 MED ORDER — TETANUS-DIPHTH-ACELL PERTUSSIS 5-2.5-18.5 LF-MCG/0.5 IM SUSY
0.5000 mL | PREFILLED_SYRINGE | Freq: Once | INTRAMUSCULAR | Status: AC
  Administered 2020-08-16: 0.5 mL via INTRAMUSCULAR

## 2020-08-16 NOTE — Discharge Instructions (Addendum)
Wash daily with mild soap and water Apply silvadene with nonstick dressing Take antibiotic 2 x a day See wound clinic if it fails to improve.  They should call you with appointment

## 2020-08-16 NOTE — ED Notes (Signed)
Non-stick DSG applied with silvadene to left upper arm wound.

## 2020-08-16 NOTE — ED Triage Notes (Signed)
Pt states she had a fall 3 weeks ago and had a small skin tear on her left arm. Pt states the wound has progressively gotten bigger and seems infected. The wound is red and draining some pus.

## 2020-08-16 NOTE — ED Provider Notes (Signed)
[facility]    CSN: [id number] Arrival date & time: 08/16/20  1128      History   Chief Complaint Chief Complaint  Patient presents with  . Wound Check    HPI [name] is a 86 y.o. female.   HPI  [place] 3 weeks ago and had a small tear on her arm.  She has been cleaning it with normal saline.  She has been putting a dressing on it left over from the wound clinic sometime ago, it sounds like it is a Vaseline impregnated gauze, she states yellow in color.  Likely Xeroform.  The area is getting bigger in size.  The skin underneath the dressing is peeling.  She states it is uncomfortable.  She thinks it is infected.  She is here for evaluation.  Past Medical History:  Diagnosis Date  . Arthritis    "osteoarthritis"  . Diarrhea 08/28/11   "for the last 17 days; from [place] poisoning"  . GERD (gastroesophageal reflux disease)   . Headache(784.0) 08/28/11   "just for the last few days; releated to dehydration"  . Hearing aid worn   . Shortness of breath 08/28/11   "hard time breathing deeply because of the pain"    Patient Active Problem List   Diagnosis Date Noted  . Presbycusis of both ears 12/02/2018  . S/P reverse total shoulder arthroplasty, left 04/09/2018  . Chronic left shoulder pain 03/20/2018  . Sensorineural hearing loss (SNHL), bilateral 01/03/2017  . Bilateral impacted cerumen 07/03/2016  . Rhinitis, chronic 07/03/2016  . H/O thrombocytosis 01/29/2012  . Neck pain, acute 08/31/2011  . Arthritis of hand 08/31/2011  . Supraventricular tachycardia ([place]) 08/31/2011  . Salmonella enteritis 08/28/2011  . Muscle spasm of back 08/28/2011  . Dehydration 08/28/2011  . GERD (gastroesophageal reflux disease) 08/28/2011  . DJD (degenerative joint disease) 08/28/2011  . Rotator cuff tear, left 08/28/2011  . ARF (acute renal failure) ([place]) 08/28/2011    Past Surgical History:  Procedure Laterality Date  . BACK SURGERY  1/06   "spinal stenosis"  .  CATARACT EXTRACTION W/ INTRAOCULAR LENS  IMPLANT, BILATERAL  Summer 2011  . FACIAL COSMETIC SURGERY  ~ 2002  . FINGER SURGERY  ~ 2000   "titanium rod in right pointer; from arthritis"  . FOOT SURGERY Right   . SHOULDER SURGERY Left 03/2018  . TONSILLECTOMY  1943  . TUBAL LIGATION  1970's  . VAGINAL HYSTERECTOMY  2/04   "still have my ovaries"    OB History    Gravida  2   Para  2   Term  2   Preterm      AB      Living  1     SAB      TAB      Ectopic      Multiple      Live Births  2            Home Medications    Prior to Admission medications   Medication Sig Start Date End Date Taking? Authorizing Provider  Acetaminophen (TYLENOL EXTRA STRENGTH PO) Take by mouth.    [provider]  B Complex Vitamins (B-COMPLEX/B-12 PO) Take by mouth daily.    [provider]  Biotin 10 MG TABS Take by mouth.    [provider]  conjugated estrogens (PREMARIN) vaginal cream PLACE 0.5 GRAM VAGINALLY 2 TIMES A WEEK AS DIRECTED 07/23/20   Nunzio Cobbs, MD  diclofenac  sodium (VOLTAREN) 1 % GEL  10/24/16   [provider]  doxycycline (VIBRAMYCIN) 100 MG capsule Take 1 capsule (100 mg total) by mouth 2 (two) times daily. 08/16/20   Raylene Everts, MD  estradiol (VIVELLE-DOT) 0.025 MG/24HR APPLY 1/2 PATCH EXTERNALLY TO THE SKIN 2 TIMES A WEEK 11/13/19   Regina Eck, CNM  hydrochlorothiazide (MICROZIDE) 12.5 MG capsule  06/17/19   [provider]  hydroxychloroquine (PLAQUENIL) 200 MG tablet Take 200 mg by mouth daily. 08/25/19   [provider]  inFLIXimab-abda (RENFLEXIS IV) Inject into the vein.    [provider]  ipratropium (ATROVENT) 0.06 % nasal spray U 2 SPRAYS IEN TID PRF RHINITIS 12/03/16   [provider]  metoprolol succinate (TOPROL-XL) 25 MG 24 hr tablet TK 1 T PO ONCE A DAY FOR BP AND HEART RATE CONTROL 05/21/19   [provider]  NONFORMULARY OR COMPOUNDED ITEM  TESTOSTERONE PROPIONATE 2% PETROLATUM (JAR)  APPLY ONE TEAR DROP SIZE AMOUNT TO VAGINAL AREA AT BEDTIME TWO TIMES WEEKLY. 07/05/20   Megan Salon, MD  OVER THE COUNTER MEDICATION Place 1 drop into both eyes 2 (two) times daily as needed (redness/ dry eyes). Over the counter eye drops    [provider]  oxyCODONE (OXY IR/ROXICODONE) 5 MG immediate release tablet Take 5 mg by mouth at bedtime as needed. 06/15/20   [provider]  pantoprazole (PROTONIX) 40 MG tablet Take 40 mg by mouth daily. 05/10/20   [provider]  RESTASIS 0.05 % ophthalmic emulsion INT 1 GTT INTO OU BID 11/12/18   [provider]  silver sulfADIAZINE (SILVADENE) 1 % cream Apply 1 application topically daily. 08/16/20   Raylene Everts, MD  triamcinolone cream (KENALOG) 0.1 % APP TO NAIL 2 TO 3 XD FOR 1 MONTH UTD 05/19/19   [provider]  valACYclovir (VALTREX) 1000 MG tablet as needed.  01/06/17   [provider]    Family History Family History  Problem Relation Age of Onset  . Cancer Father        colon    Social History Social History   Tobacco Use  . Smoking status: Former Smoker    Packs/day: 1.50    Years: 30.00    Pack years: 45.00    Types: Cigarettes  . Smokeless tobacco: Never Used  . Tobacco comment: quit 30 years go   Substance Use Topics  . Alcohol use: Yes    Comment: 1.5 a day  . Drug use: No     Allergies   Sulfa antibiotics   Review of Systems Review of Systems See HPI  Physical Exam Triage Vital Signs ED Triage Vitals  Enc Vitals Group     BP 08/16/20 1319 (!) 139/59     Pulse Rate 08/16/20 1319 62     Resp 08/16/20 1319 18     Temp 08/16/20 1319 98.2 F (36.8 C)     Temp Source 08/16/20 1319 Oral     SpO2 08/16/20 1319 97 %     Weight --      Height --      Head Circumference --      Peak Flow --      Pain Score 08/16/20 1316 0     Pain Loc --      Pain Edu? --      Excl. in [place]? --    No data found.  Updated  Vital Signs BP (!) 139/59 (BP Location: Right  Arm)   Pulse 62   Temp 98.2 F (36.8 C) (Oral)   Resp 18   SpO2 97%     Physical Exam Constitutional:      General: She is not in acute distress.    Appearance: She is well-developed.  HENT:     Head: Normocephalic and atraumatic.     Mouth/Throat:     Comments: Mask is in place Eyes:     Conjunctiva/sclera: Conjunctivae normal.     Pupils: Pupils are equal, round, and reactive to light.  Cardiovascular:     Rate and Rhythm: Normal rate.  Pulmonary:     Effort: Pulmonary effort is normal. No respiratory distress.  Abdominal:     General: There is no distension.     Palpations: Abdomen is soft.  Musculoskeletal:        General: Normal range of motion.     Cervical back: Normal range of motion.  Skin:    General: Skin is warm and dry.     Comments: Patch of skin on left deltoid region, upper arm that is approximately rectangular in shape.  The skin is open with superficial blistering and peeling  Neurological:     Mental Status: She is alert.  Psychiatric:        Behavior: Behavior normal.      UC Treatments / Results  Labs (all labs ordered are listed, but only abnormal results are displayed) Labs Reviewed - No data to display  EKG   Radiology No results found.  Procedures Procedures (including critical care time)  Medications Ordered in UC Medications  Tdap (BOOSTRIX) injection 0.5 mL (0.5 mLs Intramuscular Given 08/16/20 1347)    Initial Impression / Assessment and Plan / UC Course  I have reviewed the triage vital signs and the nursing notes.  Pertinent labs & imaging results that were available during my care of the patient were reviewed by me and considered in my medical decision making (see chart for details).    I believe the skin is excoriated due to a reaction to the the products she is using.  I believe it is causing blistering.  We will switch her to a Silvadene cream.  Patient on oral antibiotics  for any infection present.  Follow-up with wound clinic Final Clinical Impressions(s) / UC Diagnoses   Final diagnoses:  Post-traumatic wound infection     Discharge Instructions     Wash daily with mild soap and water Apply silvadene with nonstick dressing Take antibiotic 2 x a day See wound clinic if it fails to improve.  They should call you with appointment   ED Prescriptions    Medication Sig Dispense Auth. Provider   doxycycline (VIBRAMYCIN) 100 MG capsule Take 1 capsule (100 mg total) by mouth 2 (two) times daily. 14 capsule Raylene Everts, MD   silver sulfADIAZINE (SILVADENE) 1 % cream Apply 1 application topically daily. 50 g Raylene Everts, MD     PDMP not reviewed this encounter.   Raylene Everts, MD 08/16/20 1534

## 2020-08-23 ENCOUNTER — Ambulatory Visit: Payer: PPO | Admitting: Certified Nurse Midwife

## 2020-08-30 ENCOUNTER — Encounter (HOSPITAL_BASED_OUTPATIENT_CLINIC_OR_DEPARTMENT_OTHER): Payer: PPO | Admitting: Internal Medicine

## 2020-08-30 DIAGNOSIS — M459 Ankylosing spondylitis of unspecified sites in spine: Secondary | ICD-10-CM | POA: Diagnosis not present

## 2020-09-03 ENCOUNTER — Encounter (HOSPITAL_BASED_OUTPATIENT_CLINIC_OR_DEPARTMENT_OTHER): Payer: PPO | Admitting: Internal Medicine

## 2020-09-27 DIAGNOSIS — H6121 Impacted cerumen, right ear: Secondary | ICD-10-CM | POA: Diagnosis not present

## 2020-09-27 DIAGNOSIS — H6122 Impacted cerumen, left ear: Secondary | ICD-10-CM | POA: Diagnosis not present

## 2020-09-27 DIAGNOSIS — H6123 Impacted cerumen, bilateral: Secondary | ICD-10-CM | POA: Diagnosis not present

## 2020-10-12 ENCOUNTER — Other Ambulatory Visit: Payer: Self-pay

## 2020-10-12 MED ORDER — ESTRADIOL 0.025 MG/24HR TD PTTW
MEDICATED_PATCH | TRANSDERMAL | 0 refills | Status: DC
Start: 2020-10-12 — End: 2020-10-26

## 2020-10-12 NOTE — Telephone Encounter (Signed)
Medication refill request: Estradiol Patch Last AEX:  08/20/19 DL Next AEX: 3/70/96 Dr. Edward Jolly Last MMG (if hormonal medication request): 10/29/19 BIRADS 1 negative/density b Refill authorized: today, please advise

## 2020-10-22 ENCOUNTER — Other Ambulatory Visit: Payer: Self-pay | Admitting: Obstetrics and Gynecology

## 2020-10-26 ENCOUNTER — Encounter: Payer: Self-pay | Admitting: Obstetrics and Gynecology

## 2020-10-26 ENCOUNTER — Other Ambulatory Visit: Payer: Self-pay

## 2020-10-26 ENCOUNTER — Ambulatory Visit: Payer: PPO | Admitting: Obstetrics and Gynecology

## 2020-10-26 VITALS — BP 140/72 | HR 56 | Ht 61.0 in | Wt 105.0 lb

## 2020-10-26 DIAGNOSIS — Z01419 Encounter for gynecological examination (general) (routine) without abnormal findings: Secondary | ICD-10-CM | POA: Diagnosis not present

## 2020-10-26 DIAGNOSIS — R6882 Decreased libido: Secondary | ICD-10-CM

## 2020-10-26 DIAGNOSIS — Z79890 Hormone replacement therapy (postmenopausal): Secondary | ICD-10-CM | POA: Diagnosis not present

## 2020-10-26 DIAGNOSIS — R7989 Other specified abnormal findings of blood chemistry: Secondary | ICD-10-CM

## 2020-10-26 DIAGNOSIS — N952 Postmenopausal atrophic vaginitis: Secondary | ICD-10-CM | POA: Diagnosis not present

## 2020-10-26 DIAGNOSIS — Z1239 Encounter for other screening for malignant neoplasm of breast: Secondary | ICD-10-CM

## 2020-10-26 MED ORDER — ESTRADIOL 0.025 MG/24HR TD PTTW
MEDICATED_PATCH | TRANSDERMAL | 3 refills | Status: DC
Start: 2020-10-26 — End: 2021-08-11

## 2020-10-26 MED ORDER — PREMARIN 0.625 MG/GM VA CREA: TOPICAL_CREAM | VAGINAL | 1 refills | Status: DC

## 2020-10-26 MED ORDER — NONFORMULARY OR COMPOUNDED ITEM: 1 refills | Status: DC

## 2020-10-26 NOTE — Progress Notes (Unsigned)
87 y.o. G95P2001 Married Caucasian female here for annual exam.    Using transdermal estrogen.  She cuts the patch in half.  She wants to continue.   She has tachycardia and no atrial fibrillation.   Likes to travel.  Received her Covid booster and flu vaccine.   PCP:   Dr. Marisue Humble.  No LMP recorded. Patient has had a hysterectomy.           Sexually active: Yes.    The current method of family planning is status post hysterectomy.    Exercising: Yes.    walks 3 miles 5-6 days/week and works out with a trainer Smoker:  no  Health Maintenance: Pap: 04/2008 Neg History of abnormal Pap:  no MMG: 10/2019 normal per patient--[facility]--has appt. Next week Colonoscopy:  2019 normal;aged out BMD: 2017 Result :Osteopenia TDaP: 08-16-20 Gardasil:   no HIV:no Hep C:no Screening Labs: PCP.   reports that she has quit smoking. Her smoking use included cigarettes. She has a 45.00 pack-year smoking history. She has never used smokeless tobacco. She reports current alcohol use of about 1.0 standard drink of alcohol per week. She reports that she does not use drugs.  Past Medical History:  Diagnosis Date  . Arthritis    "osteoarthritis"  . Diarrhea 08/28/11   "for the last 17 days; from [place] poisoning"  . GERD (gastroesophageal reflux disease)   . Headache(784.0) 08/28/11   "just for the last few days; releated to dehydration"  . Hearing aid worn   . Shortness of breath 08/28/11   "hard time breathing deeply because of the pain"    Past Surgical History:  Procedure Laterality Date  . BACK SURGERY  1/06   "spinal stenosis"  . CATARACT EXTRACTION W/ INTRAOCULAR LENS  IMPLANT, BILATERAL  Summer 2011  . FACIAL COSMETIC SURGERY  ~ 2002  . FINGER SURGERY  ~ 2000   "titanium rod in right pointer; from arthritis"  . FOOT SURGERY Right   . SHOULDER SURGERY Left 03/2018  . TONSILLECTOMY  1943  . TUBAL LIGATION  1970's  . VAGINAL HYSTERECTOMY  2/04   "still have my ovaries"     Current Outpatient Medications  Medication Sig Dispense Refill  . Acetaminophen (TYLENOL EXTRA STRENGTH PO) Take by mouth.    . B Complex Vitamins (B-COMPLEX/B-12 PO) Take by mouth daily.    . Biotin 10 MG TABS Take by mouth.    . conjugated estrogens (PREMARIN) vaginal cream PLACE 0.5 GRAM VAGINALLY 2 TIMES A WEEK AS DIRECTED 30 g 0  . Cyanocobalamin (VITAMIN B-12) 1000 MCG SUBL 1 tablet under the tongue and allow to dissolve    . diclofenac sodium (VOLTAREN) 1 % GEL     . estradiol (VIVELLE-DOT) 0.025 MG/24HR Apply 1/2 patch externally to the skin two times a week 8 patch 0  . hydrochlorothiazide (MICROZIDE) 12.5 MG capsule     . hydroxychloroquine (PLAQUENIL) 200 MG tablet Take 200 mg by mouth daily.    Marland Kitchen inFLIXimab-abda (RENFLEXIS IV) Inject into the vein.    Marland Kitchen ipratropium (ATROVENT) 0.06 % nasal spray U 2 SPRAYS IEN TID PRF RHINITIS  5  . metoprolol succinate (TOPROL-XL) 25 MG 24 hr tablet TK 1 T PO ONCE A DAY FOR BP AND HEART RATE CONTROL    . NONFORMULARY OR COMPOUNDED ITEM TESTOSTERONE PROPIONATE 2% PETROLATUM (JAR)  APPLY ONE TEAR DROP SIZE AMOUNT TO VAGINAL AREA AT BEDTIME TWO TIMES WEEKLY. 60 each 1  . OVER THE COUNTER MEDICATION Place 1  drop into both eyes 2 (two) times daily as needed (redness/ dry eyes). Over the counter eye drops    . oxyCODONE (OXY IR/ROXICODONE) 5 MG immediate release tablet Take 5 mg by mouth at bedtime as needed.    . pantoprazole (PROTONIX) 40 MG tablet Take 40 mg by mouth daily.    . RESTASIS 0.05 % ophthalmic emulsion INT 1 GTT INTO OU BID    . triamcinolone cream (KENALOG) 0.1 % APP TO NAIL 2 TO 3 XD FOR 1 MONTH UTD    . valACYclovir (VALTREX) 1000 MG tablet as needed.      No current facility-administered medications for this visit.    Family History  Problem Relation Age of Onset  . Cancer Father        colon    Review of Systems  All other systems reviewed and are negative.   Exam:   BP 140/72   Pulse (!) 56   Ht 5\' 1"  (1.549 m)    Wt 105 lb (47.6 kg)   SpO2 (!) 18%   BMI 19.84 kg/m     General appearance: alert, cooperative and appears stated age Head: normocephalic, without obvious abnormality, atraumatic Neck: no adenopathy, supple, symmetrical, trachea midline and thyroid normal to inspection and palpation Lungs: clear to auscultation bilaterally Breasts: normal appearance, no masses or tenderness, No nipple retraction or dimpling, No nipple discharge or bleeding, No axillary adenopathy Heart: regular rate and rhythm Abdomen: soft, non-tender; no masses, no organomegaly Extremities: extremities normal, atraumatic, no cyanosis or edema Skin: skin color, texture, turgor normal. No rashes or lesions Lymph nodes: cervical, supraclavicular, and axillary nodes normal. Neurologic: grossly normal  Pelvic: External genitalia:  no lesions              No abnormal inguinal nodes palpated.              Urethra:  normal appearing urethra with no masses, tenderness or lesions              Bartholins and Skenes: normal                 Vagina: normal appearing vagina with normal color and discharge, no lesions              Cervix: absent              Pap taken: No. Bimanual Exam:  Uterus:  Absent.              Adnexa: no mass, fullness, tenderness              Rectal exam: Yes.    Confirms.              Anus:  normal sphincter tone, no lesions  Chaperone was present for exam.  Assessment:   Encounter for screening breast exam and pelvic exam with normal findings.  Status post TVH.  Ovaries remain.  On ERT.  Atrophy.  Using local vaginal estrogen.  Decreased libido.  On testosterone.  Hx HSV. Hx osteopenia.   Plan: Mammogram screening discussed. Self breast awareness reviewed. Pap and HR HPV as above. Guidelines for Calcium, Vitamin D, regular exercise program including cardiovascular and weight bearing exercise. Refill of Vivelle Dot, vaginal estrogen, and compounded testosterone. Discused WHI and use of ERT  which can increase risk of PE, DVT, and stroke. Will check testosterone levels today.  BMD through PCP if needed.  Follow up annually and prn.

## 2020-10-30 LAB — TESTOS,TOTAL,FREE AND SHBG (FEMALE)
Free Testosterone: 13.2 pg/mL — ABNORMAL HIGH (ref 0.2–3.7)
Sex Hormone Binding: 74 nmol/L — ABNORMAL HIGH (ref 14–73)
Testosterone, Total, LC-MS-MS: 118 ng/dL — ABNORMAL HIGH (ref 2–45)

## 2020-11-02 ENCOUNTER — Other Ambulatory Visit: Payer: Self-pay | Admitting: Obstetrics and Gynecology

## 2020-11-02 NOTE — Addendum Note (Signed)
Addended by: Yisroel Ramming,  E on: 11/02/2020 11:40 AM   Modules accepted: Orders

## 2020-11-03 ENCOUNTER — Encounter: Payer: Self-pay | Admitting: Obstetrics and Gynecology

## 2020-11-03 DIAGNOSIS — Z1231 Encounter for screening mammogram for malignant neoplasm of breast: Secondary | ICD-10-CM | POA: Diagnosis not present

## 2020-11-11 DIAGNOSIS — M459 Ankylosing spondylitis of unspecified sites in spine: Secondary | ICD-10-CM | POA: Diagnosis not present

## 2020-11-15 DIAGNOSIS — K219 Gastro-esophageal reflux disease without esophagitis: Secondary | ICD-10-CM | POA: Diagnosis not present

## 2020-11-15 DIAGNOSIS — Z8719 Personal history of other diseases of the digestive system: Secondary | ICD-10-CM | POA: Diagnosis not present

## 2020-11-19 DIAGNOSIS — M0609 Rheumatoid arthritis without rheumatoid factor, multiple sites: Secondary | ICD-10-CM | POA: Diagnosis not present

## 2020-11-19 DIAGNOSIS — Z6820 Body mass index (BMI) 20.0-20.9, adult: Secondary | ICD-10-CM | POA: Diagnosis not present

## 2020-11-19 DIAGNOSIS — Z1589 Genetic susceptibility to other disease: Secondary | ICD-10-CM | POA: Diagnosis not present

## 2020-11-19 DIAGNOSIS — M459 Ankylosing spondylitis of unspecified sites in spine: Secondary | ICD-10-CM | POA: Diagnosis not present

## 2020-11-19 DIAGNOSIS — M150 Primary generalized (osteo)arthritis: Secondary | ICD-10-CM | POA: Diagnosis not present

## 2020-11-19 DIAGNOSIS — Z79899 Other long term (current) drug therapy: Secondary | ICD-10-CM | POA: Diagnosis not present

## 2020-11-19 DIAGNOSIS — M154 Erosive (osteo)arthritis: Secondary | ICD-10-CM | POA: Diagnosis not present

## 2021-01-13 DIAGNOSIS — M459 Ankylosing spondylitis of unspecified sites in spine: Secondary | ICD-10-CM | POA: Diagnosis not present

## 2021-01-16 ENCOUNTER — Telehealth: Payer: Self-pay | Admitting: Obstetrics and Gynecology

## 2021-01-16 NOTE — Telephone Encounter (Signed)
Please contact patient to remind her to schedule a lab visit to recheck her testosterone level, which was elevated in January, 2022.  I have placed a future order.

## 2021-01-18 NOTE — Telephone Encounter (Signed)
Per DPR access note on file I left detailed message in patient's voice mail and asked her to call me if any questions. Advised her order is on file she just needs to call and schedule lab appointment.

## 2021-01-24 ENCOUNTER — Other Ambulatory Visit (INDEPENDENT_AMBULATORY_CARE_PROVIDER_SITE_OTHER): Payer: PPO

## 2021-01-24 ENCOUNTER — Other Ambulatory Visit: Payer: Self-pay

## 2021-01-24 DIAGNOSIS — R7989 Other specified abnormal findings of blood chemistry: Secondary | ICD-10-CM

## 2021-01-24 DIAGNOSIS — R6882 Decreased libido: Secondary | ICD-10-CM

## 2021-01-27 LAB — TESTOS,TOTAL,FREE AND SHBG (FEMALE)
Free Testosterone: 1.4 pg/mL (ref 0.2–3.7)
Sex Hormone Binding: 70 nmol/L (ref 14–73)
Testosterone, Total, LC-MS-MS: 17 ng/dL (ref 2–45)

## 2021-02-07 DIAGNOSIS — G479 Sleep disorder, unspecified: Secondary | ICD-10-CM | POA: Diagnosis not present

## 2021-02-07 DIAGNOSIS — E871 Hypo-osmolality and hyponatremia: Secondary | ICD-10-CM | POA: Diagnosis not present

## 2021-02-07 DIAGNOSIS — Z1389 Encounter for screening for other disorder: Secondary | ICD-10-CM | POA: Diagnosis not present

## 2021-02-07 DIAGNOSIS — Z0000 Encounter for general adult medical examination without abnormal findings: Secondary | ICD-10-CM | POA: Diagnosis not present

## 2021-02-07 DIAGNOSIS — E7800 Pure hypercholesterolemia, unspecified: Secondary | ICD-10-CM | POA: Diagnosis not present

## 2021-02-07 DIAGNOSIS — I10 Essential (primary) hypertension: Secondary | ICD-10-CM | POA: Diagnosis not present

## 2021-02-07 DIAGNOSIS — K219 Gastro-esophageal reflux disease without esophagitis: Secondary | ICD-10-CM | POA: Diagnosis not present

## 2021-02-07 DIAGNOSIS — M899 Disorder of bone, unspecified: Secondary | ICD-10-CM | POA: Diagnosis not present

## 2021-02-21 DIAGNOSIS — H52203 Unspecified astigmatism, bilateral: Secondary | ICD-10-CM | POA: Diagnosis not present

## 2021-02-21 DIAGNOSIS — H353131 Nonexudative age-related macular degeneration, bilateral, early dry stage: Secondary | ICD-10-CM | POA: Diagnosis not present

## 2021-02-21 DIAGNOSIS — H04123 Dry eye syndrome of bilateral lacrimal glands: Secondary | ICD-10-CM | POA: Diagnosis not present

## 2021-02-21 DIAGNOSIS — Z961 Presence of intraocular lens: Secondary | ICD-10-CM | POA: Diagnosis not present

## 2021-02-22 DIAGNOSIS — L570 Actinic keratosis: Secondary | ICD-10-CM | POA: Diagnosis not present

## 2021-02-22 DIAGNOSIS — C44519 Basal cell carcinoma of skin of other part of trunk: Secondary | ICD-10-CM | POA: Diagnosis not present

## 2021-02-22 DIAGNOSIS — L821 Other seborrheic keratosis: Secondary | ICD-10-CM | POA: Diagnosis not present

## 2021-02-22 DIAGNOSIS — L739 Follicular disorder, unspecified: Secondary | ICD-10-CM | POA: Diagnosis not present

## 2021-02-22 DIAGNOSIS — D225 Melanocytic nevi of trunk: Secondary | ICD-10-CM | POA: Diagnosis not present

## 2021-02-22 DIAGNOSIS — L738 Other specified follicular disorders: Secondary | ICD-10-CM | POA: Diagnosis not present

## 2021-02-22 DIAGNOSIS — D485 Neoplasm of uncertain behavior of skin: Secondary | ICD-10-CM | POA: Diagnosis not present

## 2021-02-22 DIAGNOSIS — Z85828 Personal history of other malignant neoplasm of skin: Secondary | ICD-10-CM | POA: Diagnosis not present

## 2021-02-28 DIAGNOSIS — H6121 Impacted cerumen, right ear: Secondary | ICD-10-CM | POA: Diagnosis not present

## 2021-02-28 DIAGNOSIS — H6123 Impacted cerumen, bilateral: Secondary | ICD-10-CM | POA: Diagnosis not present

## 2021-02-28 DIAGNOSIS — H6122 Impacted cerumen, left ear: Secondary | ICD-10-CM | POA: Diagnosis not present

## 2021-03-17 DIAGNOSIS — Z79899 Other long term (current) drug therapy: Secondary | ICD-10-CM | POA: Diagnosis not present

## 2021-03-17 DIAGNOSIS — M459 Ankylosing spondylitis of unspecified sites in spine: Secondary | ICD-10-CM | POA: Diagnosis not present

## 2021-03-17 DIAGNOSIS — R5383 Other fatigue: Secondary | ICD-10-CM | POA: Diagnosis not present

## 2021-03-17 DIAGNOSIS — Z111 Encounter for screening for respiratory tuberculosis: Secondary | ICD-10-CM | POA: Diagnosis not present

## 2021-05-09 DIAGNOSIS — Z85828 Personal history of other malignant neoplasm of skin: Secondary | ICD-10-CM | POA: Diagnosis not present

## 2021-05-09 DIAGNOSIS — B370 Candidal stomatitis: Secondary | ICD-10-CM | POA: Diagnosis not present

## 2021-05-09 DIAGNOSIS — L03011 Cellulitis of right finger: Secondary | ICD-10-CM | POA: Diagnosis not present

## 2021-05-09 DIAGNOSIS — K130 Diseases of lips: Secondary | ICD-10-CM | POA: Diagnosis not present

## 2021-05-09 DIAGNOSIS — L821 Other seborrheic keratosis: Secondary | ICD-10-CM | POA: Diagnosis not present

## 2021-05-12 DIAGNOSIS — M0609 Rheumatoid arthritis without rheumatoid factor, multiple sites: Secondary | ICD-10-CM | POA: Diagnosis not present

## 2021-05-12 DIAGNOSIS — Z79899 Other long term (current) drug therapy: Secondary | ICD-10-CM | POA: Diagnosis not present

## 2021-05-19 ENCOUNTER — Ambulatory Visit: Payer: PPO | Admitting: Podiatry

## 2021-05-19 ENCOUNTER — Encounter: Payer: Self-pay | Admitting: Podiatry

## 2021-05-19 ENCOUNTER — Other Ambulatory Visit: Payer: Self-pay

## 2021-05-19 DIAGNOSIS — L03032 Cellulitis of left toe: Secondary | ICD-10-CM

## 2021-05-19 NOTE — Progress Notes (Signed)
Subjective:   Patient ID: [name], female   DOB: 87 y.o.   MRN: [id number]   HPI Patient presents stating she developed redness and pain on the big toenail left medial side that makes it hard for her to wear shoe gear its been going on for a few weeks and does not remember injury   ROS      Objective:  Physical Exam  Neurovascular status intact with a inflamed left hallux medial border with yellow discoloration distal with no proximal edema erythema or drainage noted currently     Assessment:  Paronychia infection with ingrown toenail component left hallux     Plan:  H&P reviewed condition and explained to her ingrown toenail and paronychia infection.  Today I infiltrated 60 mg like Marcaine mixture sterile prep done remove the medial border removed all prep/abscess tissue create a channel for drainage and instructed on soaks.  Patient will be seen back as needed and should heal uneventfully

## 2021-05-19 NOTE — Patient Instructions (Signed)

## 2021-05-26 DIAGNOSIS — Z79899 Other long term (current) drug therapy: Secondary | ICD-10-CM | POA: Diagnosis not present

## 2021-05-26 DIAGNOSIS — M0609 Rheumatoid arthritis without rheumatoid factor, multiple sites: Secondary | ICD-10-CM | POA: Diagnosis not present

## 2021-05-26 DIAGNOSIS — L409 Psoriasis, unspecified: Secondary | ICD-10-CM | POA: Diagnosis not present

## 2021-05-26 DIAGNOSIS — Z681 Body mass index (BMI) 19 or less, adult: Secondary | ICD-10-CM | POA: Diagnosis not present

## 2021-05-26 DIAGNOSIS — M150 Primary generalized (osteo)arthritis: Secondary | ICD-10-CM | POA: Diagnosis not present

## 2021-05-26 DIAGNOSIS — M154 Erosive (osteo)arthritis: Secondary | ICD-10-CM | POA: Diagnosis not present

## 2021-05-26 DIAGNOSIS — M459 Ankylosing spondylitis of unspecified sites in spine: Secondary | ICD-10-CM | POA: Diagnosis not present

## 2021-05-26 DIAGNOSIS — Z1589 Genetic susceptibility to other disease: Secondary | ICD-10-CM | POA: Diagnosis not present

## 2021-06-15 ENCOUNTER — Other Ambulatory Visit: Payer: Self-pay

## 2021-06-15 NOTE — Telephone Encounter (Signed)
Last AEX 10/26/20 Neg Mammo 11/03/20  She did have testosterone levels checked 01/24/21 and you wrote her in My Chart that "Your testosterone level has returned to a normal female range."  AEX scheduled 10/27/21

## 2021-06-16 ENCOUNTER — Telehealth: Payer: Self-pay | Admitting: Obstetrics and Gynecology

## 2021-06-16 ENCOUNTER — Other Ambulatory Visit: Payer: Self-pay | Admitting: Obstetrics and Gynecology

## 2021-06-16 DIAGNOSIS — R6882 Decreased libido: Secondary | ICD-10-CM

## 2021-06-16 DIAGNOSIS — Z5181 Encounter for therapeutic drug level monitoring: Secondary | ICD-10-CM

## 2021-06-16 MED ORDER — NONFORMULARY OR COMPOUNDED ITEM: 0 refills | Status: DC

## 2021-06-16 NOTE — Telephone Encounter (Signed)
Called into pharmacy

## 2021-06-16 NOTE — Telephone Encounter (Signed)
I have refilled the patient's prescription for testosterone but recommend she now use it only once a week and apply to her inner thigh, and alternate the thigh used.   This will need to be faxed to [facility].  Please make an appointment for a lab visit in 6 weeks to recheck her testosterone level.  It was really high when she was using the testosterone twice a week.

## 2021-06-16 NOTE — Telephone Encounter (Signed)
Spoke with patient and reviewed change in medication directions and need to follow up with lab in 6 weeks to recheck level.  Lab appointment scheduled 07/25/21.  Rx was phoned in to pharmacist, Ed at [facility].

## 2021-06-16 NOTE — Telephone Encounter (Signed)
Routing to [employer].

## 2021-07-07 DIAGNOSIS — M459 Ankylosing spondylitis of unspecified sites in spine: Secondary | ICD-10-CM | POA: Diagnosis not present

## 2021-07-25 ENCOUNTER — Other Ambulatory Visit: Payer: PPO

## 2021-07-25 ENCOUNTER — Other Ambulatory Visit: Payer: Self-pay

## 2021-07-25 DIAGNOSIS — Z5181 Encounter for therapeutic drug level monitoring: Secondary | ICD-10-CM | POA: Diagnosis not present

## 2021-07-25 DIAGNOSIS — R6882 Decreased libido: Secondary | ICD-10-CM

## 2021-07-29 LAB — TESTOS,TOTAL,FREE AND SHBG (FEMALE)
Free Testosterone: 1.2 pg/mL (ref 0.2–3.7)
Sex Hormone Binding: 74 nmol/L — ABNORMAL HIGH (ref 14–73)
Testosterone, Total, LC-MS-MS: 17 ng/dL (ref 2–45)

## 2021-08-11 ENCOUNTER — Other Ambulatory Visit: Payer: Self-pay | Admitting: Obstetrics and Gynecology

## 2021-08-11 NOTE — Telephone Encounter (Signed)
Annual exam scheduled on 11/07/2021 Last annual exam was on 08/20/2019 Last mammogram was on 11/03/2020

## 2021-09-05 DIAGNOSIS — M459 Ankylosing spondylitis of unspecified sites in spine: Secondary | ICD-10-CM | POA: Diagnosis not present

## 2021-09-13 ENCOUNTER — Other Ambulatory Visit: Payer: Self-pay | Admitting: Obstetrics and Gynecology

## 2021-09-13 DIAGNOSIS — N952 Postmenopausal atrophic vaginitis: Secondary | ICD-10-CM

## 2021-09-14 NOTE — Telephone Encounter (Signed)
Last AEX 10/27/19 Scheduled 10/27/20

## 2021-09-15 DIAGNOSIS — L821 Other seborrheic keratosis: Secondary | ICD-10-CM | POA: Diagnosis not present

## 2021-09-15 DIAGNOSIS — Z85828 Personal history of other malignant neoplasm of skin: Secondary | ICD-10-CM | POA: Diagnosis not present

## 2021-09-15 DIAGNOSIS — L03011 Cellulitis of right finger: Secondary | ICD-10-CM | POA: Diagnosis not present

## 2021-10-26 NOTE — Progress Notes (Signed)
88 y.o. G56P2001 Married Caucasian female here for annual exam.    Patient needs refill on HRT. Using transdermal estrogen and cuts the patch in half.  Uses vaginal estrogen cream.  Hx tachycardia.  Uses testosterone replacement twice a week.  Last testosterone level normal 07/25/21.  She wants to continue all of her hormonal therapies.   Elevated blood pressure today.  She does have a blood pressure machine at home.   Having some urinary incontinence with urgency when she gets home.  Some urgency at other occasions at home.  Up once a night to void.  No caffeine use.  Not a concern for her right now.   Has osteopenia.   PCP:  Gaynelle Arabian, MD   No LMP recorded. Patient has had a hysterectomy.           Sexually active: Yes.    The current method of family planning is status post hysterectomy.    Exercising: No.   Walks 3 miles 3days/week, yoga, has a trainer Smoker:  no  Health Maintenance: Pap:  04/2008 Neg History of abnormal Pap:  no MMG:  11-03-20 Neg/Birads1.  Has appointment.  Colonoscopy:  2019 normal--aged out BMD: 2017  Result :Osteopenia TDaP: 08-16-20 Gardasil:   no HIV:no Hep C:no Screening Labs:  PCP.   reports that she has quit smoking. Her smoking use included cigarettes. She has a 45.00 pack-year smoking history. She has never used smokeless tobacco. She reports current alcohol use of about 7.0 standard drinks per week. She reports that she does not use drugs.  Past Medical History:  Diagnosis Date   Arthritis    "osteoarthritis"   Diarrhea 08/28/11   "for the last 17 days; from [place] poisoning"   GERD (gastroesophageal reflux disease)    Headache(784.0) 08/28/11   "just for the last few days; releated to dehydration"   Hearing aid worn    Shortness of breath 08/28/11   "hard time breathing deeply because of the pain"    Past Surgical History:  Procedure Laterality Date   BACK SURGERY  1/06   "spinal stenosis"   CATARACT EXTRACTION  W/ INTRAOCULAR LENS  IMPLANT, BILATERAL  Summer 2011   FACIAL COSMETIC SURGERY  ~ 2002   FINGER SURGERY  ~ 2000   "titanium rod in right pointer; from arthritis"   FOOT SURGERY Right    SHOULDER SURGERY Left 03/2018   TONSILLECTOMY  1943   TUBAL LIGATION  1970's   VAGINAL HYSTERECTOMY  2/04   "still have my ovaries"    Current Outpatient Medications  Medication Sig Dispense Refill   Acetaminophen (TYLENOL EXTRA STRENGTH PO) Take by mouth.     B Complex Vitamins (B-COMPLEX/B-12 PO) Take by mouth daily.     betamethasone dipropionate 0.05 % cream SMARTSIG:Sparingly Topical Twice Daily     Biotin 10 MG TABS Take by mouth.     clobetasol (TEMOVATE) 0.05 % external solution SMARTSIG:1 Milliliter(s) Topical Every Night     conjugated estrogens (PREMARIN) vaginal cream PLACE 0.5 GRAM VAGINALLY 2 TIMES A WEEK AS DIRECTED 30 g 0   diclofenac sodium (VOLTAREN) 1 % GEL      estradiol (VIVELLE-DOT) 0.0375 MG/24HR 1/2patch to skin     hydrochlorothiazide (MICROZIDE) 12.5 MG capsule 1 capsule in the morning     hydroxychloroquine (PLAQUENIL) 200 MG tablet Take 200 mg by mouth daily.     inFLIXimab-abda (RENFLEXIS IV) Inject into the vein.     ipratropium (ATROVENT) 0.06 % nasal spray U 2  SPRAYS IEN TID PRF RHINITIS  5   metoprolol succinate (TOPROL-XL) 25 MG 24 hr tablet TK 1 T PO ONCE A DAY FOR BP AND HEART RATE CONTROL     Multiple Vitamin (MULTIVITAMIN ADULT) TABS      NONFORMULARY OR COMPOUNDED ITEM Testosterone Propionate Petrolatum (jar) 2% ointment Disp 2 Gm. S: APPLY ONE TEAR-DROP SIZE AMOUNT TO INNER THIGH AREA AT BEDTIME 1 TIME A WEEK.  ALTERNATE THIGHS. 60 each 0   OVER THE COUNTER MEDICATION Place 1 drop into both eyes 2 (two) times daily as needed (redness/ dry eyes). Over the counter eye drops     oxyCODONE (OXY IR/ROXICODONE) 5 MG immediate release tablet Take 5 mg by mouth at bedtime as needed.     Oyster Shell Calcium 500 MG TABS 1 tablet with meals     pantoprazole (PROTONIX) 20  MG tablet Take 20 mg by mouth daily.     RESTASIS 0.05 % ophthalmic emulsion INT 1 GTT INTO OU BID     valACYclovir (VALTREX) 1000 MG tablet as needed.      No current facility-administered medications for this visit.    Family History  Problem Relation Age of Onset   Cancer Father        colon    Review of Systems  All other systems reviewed and are negative.  Exam:   BP (!) 160/74    Pulse (!) 50    Resp 14    Ht 5\' 2"  (1.575 m)    Wt 107 lb (48.5 kg)    BMI 19.57 kg/m     General appearance: alert, cooperative and appears stated age Head: normocephalic, without obvious abnormality, atraumatic Neck: no adenopathy, supple, symmetrical, trachea midline and thyroid normal to inspection and palpation Lungs: clear to auscultation bilaterally Breasts: normal appearance, no masses or tenderness, No nipple retraction or dimpling, No nipple discharge or bleeding, No axillary adenopathy Heart: regular rate and rhythm Abdomen: soft, non-tender; no masses, no organomegaly Extremities: extremities normal, atraumatic, no cyanosis or edema Skin: skin color, texture, turgor normal. No rashes or lesions Lymph nodes: cervical, supraclavicular, and axillary nodes normal. Neurologic: grossly normal  Pelvic: External genitalia:  no lesions.  Estrogen patch is on her lateral left vulva.              No abnormal inguinal nodes palpated.              Urethra:  normal appearing urethra with no masses, tenderness or lesions              Bartholins and Skenes: normal                 Vagina: normal appearing vagina with normal color and discharge, no lesions              Cervix: absent              Pap taken: no Bimanual Exam:  Uterus:  absent              Adnexa: no mass, fullness, tenderness              Rectal exam: yes.  Confirms.              Anus:  normal sphincter tone, no lesions  Chaperone was present for exam:  Estill Bamberg, CMA  Assessment:   Well woman visit with gynecologic exam. Status  post TVH.  Ovaries remain.  On ERT.  Atrophy.  Using local vaginal estrogen.  Decreased libido.  On testosterone therapy.  Medication monitoring encounter.  Hx HSV I.  Oral.  Hx osteopenia.   Plan: Mammogram screening discussed. Self breast awareness reviewed. Pap and HR HPV as above. Guidelines for Calcium, Vitamin D, regular exercise program including cardiovascular and weight bearing exercise. Refill of Vivelle Dot, vaginal estrogen cream, and testosterone.  I discussed risk of stroke, DVT, PE, thromboembolic events, and possible increased risk of breast cancer.  She accepts these risks and desires to continue her therapy.  We can check her next testosterone level at her appointment next year. I recommend a bone density.  She will discuss this with her PCP. Follow up annually and prn.   After visit summary provided.   29 min  total time was spent for this patient encounter, including preparation, face-to-face counseling with the patient, coordination of care, and documentation of the encounter.

## 2021-10-27 ENCOUNTER — Other Ambulatory Visit: Payer: Self-pay

## 2021-10-27 ENCOUNTER — Encounter: Payer: Self-pay | Admitting: Obstetrics and Gynecology

## 2021-10-27 ENCOUNTER — Ambulatory Visit (INDEPENDENT_AMBULATORY_CARE_PROVIDER_SITE_OTHER): Payer: PPO | Admitting: Obstetrics and Gynecology

## 2021-10-27 VITALS — BP 160/74 | HR 50 | Resp 14 | Ht 62.0 in | Wt 107.0 lb

## 2021-10-27 DIAGNOSIS — Z79899 Other long term (current) drug therapy: Secondary | ICD-10-CM | POA: Diagnosis not present

## 2021-10-27 DIAGNOSIS — N952 Postmenopausal atrophic vaginitis: Secondary | ICD-10-CM | POA: Diagnosis not present

## 2021-10-27 DIAGNOSIS — Z01419 Encounter for gynecological examination (general) (routine) without abnormal findings: Secondary | ICD-10-CM | POA: Diagnosis not present

## 2021-10-27 DIAGNOSIS — Z79890 Hormone replacement therapy (postmenopausal): Secondary | ICD-10-CM

## 2021-10-27 DIAGNOSIS — Z5181 Encounter for therapeutic drug level monitoring: Secondary | ICD-10-CM

## 2021-10-27 MED ORDER — PREMARIN 0.625 MG/GM VA CREA: TOPICAL_CREAM | VAGINAL | 1 refills | Status: DC

## 2021-10-27 MED ORDER — NONFORMULARY OR COMPOUNDED ITEM: 0 refills | Status: DC

## 2021-10-27 MED ORDER — ESTRADIOL 0.0375 MG/24HR TD PTTW: MEDICATED_PATCH | TRANSDERMAL | 3 refills | Status: DC

## 2021-10-27 NOTE — Patient Instructions (Signed)

## 2021-11-01 ENCOUNTER — Other Ambulatory Visit: Payer: Self-pay

## 2021-11-02 DIAGNOSIS — M459 Ankylosing spondylitis of unspecified sites in spine: Secondary | ICD-10-CM | POA: Diagnosis not present

## 2021-11-02 DIAGNOSIS — Z79899 Other long term (current) drug therapy: Secondary | ICD-10-CM | POA: Diagnosis not present

## 2021-11-07 DIAGNOSIS — Z1231 Encounter for screening mammogram for malignant neoplasm of breast: Secondary | ICD-10-CM | POA: Diagnosis not present

## 2021-11-10 ENCOUNTER — Encounter: Payer: Self-pay | Admitting: Obstetrics and Gynecology

## 2021-11-14 DIAGNOSIS — M25561 Pain in right knee: Secondary | ICD-10-CM | POA: Diagnosis not present

## 2021-11-17 DIAGNOSIS — R922 Inconclusive mammogram: Secondary | ICD-10-CM | POA: Diagnosis not present

## 2021-11-17 DIAGNOSIS — R928 Other abnormal and inconclusive findings on diagnostic imaging of breast: Secondary | ICD-10-CM | POA: Diagnosis not present

## 2021-11-17 DIAGNOSIS — R921 Mammographic calcification found on diagnostic imaging of breast: Secondary | ICD-10-CM | POA: Diagnosis not present

## 2021-11-18 ENCOUNTER — Encounter: Payer: Self-pay | Admitting: Obstetrics and Gynecology

## 2021-11-23 DIAGNOSIS — H6123 Impacted cerumen, bilateral: Secondary | ICD-10-CM | POA: Diagnosis not present

## 2021-11-28 DIAGNOSIS — L409 Psoriasis, unspecified: Secondary | ICD-10-CM | POA: Diagnosis not present

## 2021-11-28 DIAGNOSIS — Z6820 Body mass index (BMI) 20.0-20.9, adult: Secondary | ICD-10-CM | POA: Diagnosis not present

## 2021-11-28 DIAGNOSIS — M154 Erosive (osteo)arthritis: Secondary | ICD-10-CM | POA: Diagnosis not present

## 2021-11-28 DIAGNOSIS — Z1589 Genetic susceptibility to other disease: Secondary | ICD-10-CM | POA: Diagnosis not present

## 2021-11-28 DIAGNOSIS — M0609 Rheumatoid arthritis without rheumatoid factor, multiple sites: Secondary | ICD-10-CM | POA: Diagnosis not present

## 2021-11-28 DIAGNOSIS — M150 Primary generalized (osteo)arthritis: Secondary | ICD-10-CM | POA: Diagnosis not present

## 2021-11-28 DIAGNOSIS — Z79899 Other long term (current) drug therapy: Secondary | ICD-10-CM | POA: Diagnosis not present

## 2021-12-01 ENCOUNTER — Other Ambulatory Visit: Payer: Self-pay

## 2021-12-01 DIAGNOSIS — R921 Mammographic calcification found on diagnostic imaging of breast: Secondary | ICD-10-CM | POA: Diagnosis not present

## 2021-12-01 DIAGNOSIS — N6012 Diffuse cystic mastopathy of left breast: Secondary | ICD-10-CM | POA: Diagnosis not present

## 2021-12-05 DIAGNOSIS — M25561 Pain in right knee: Secondary | ICD-10-CM | POA: Diagnosis not present

## 2021-12-08 ENCOUNTER — Encounter: Payer: Self-pay | Admitting: Obstetrics and Gynecology

## 2021-12-09 DIAGNOSIS — M25561 Pain in right knee: Secondary | ICD-10-CM | POA: Diagnosis not present

## 2021-12-12 DIAGNOSIS — M25561 Pain in right knee: Secondary | ICD-10-CM | POA: Diagnosis not present

## 2021-12-29 DIAGNOSIS — Z01810 Encounter for preprocedural cardiovascular examination: Secondary | ICD-10-CM | POA: Diagnosis not present

## 2021-12-29 DIAGNOSIS — I10 Essential (primary) hypertension: Secondary | ICD-10-CM | POA: Diagnosis not present

## 2022-01-12 DIAGNOSIS — M1711 Unilateral primary osteoarthritis, right knee: Secondary | ICD-10-CM | POA: Diagnosis not present

## 2022-01-12 DIAGNOSIS — M2341 Loose body in knee, right knee: Secondary | ICD-10-CM | POA: Diagnosis not present

## 2022-01-12 DIAGNOSIS — S83281A Other tear of lateral meniscus, current injury, right knee, initial encounter: Secondary | ICD-10-CM | POA: Diagnosis not present

## 2022-01-12 DIAGNOSIS — G8918 Other acute postprocedural pain: Secondary | ICD-10-CM | POA: Diagnosis not present

## 2022-01-12 DIAGNOSIS — S83271A Complex tear of lateral meniscus, current injury, right knee, initial encounter: Secondary | ICD-10-CM | POA: Diagnosis not present

## 2022-01-12 DIAGNOSIS — Y999 Unspecified external cause status: Secondary | ICD-10-CM | POA: Diagnosis not present

## 2022-01-17 ENCOUNTER — Other Ambulatory Visit: Payer: Self-pay | Admitting: Obstetrics and Gynecology

## 2022-01-18 ENCOUNTER — Telehealth: Payer: Self-pay

## 2022-01-18 DIAGNOSIS — M25661 Stiffness of right knee, not elsewhere classified: Secondary | ICD-10-CM | POA: Diagnosis not present

## 2022-01-18 DIAGNOSIS — M25561 Pain in right knee: Secondary | ICD-10-CM | POA: Diagnosis not present

## 2022-01-18 DIAGNOSIS — M6281 Muscle weakness (generalized): Secondary | ICD-10-CM | POA: Diagnosis not present

## 2022-01-18 NOTE — Telephone Encounter (Signed)
Left message for patient to call. ?Pharmacy sent refill request for Estradiol patch 0.'025mg'$ . This was patient's previous dose. However when Dr. Quincy Simmonds refilled in in 10/2021 she sent Rx for 0.'0375mg'$  dose. ? ?I left message for patient to call to clarify. Pharmacy will get the new Rx ready. ?

## 2022-01-18 NOTE — Telephone Encounter (Signed)
Left message for patient to call. ?Pharmacy sent refill request for Estradiol patch 0.'025mg'$ . This was patient's previous dose. However when Dr. Quincy Simmonds refilled in in 10/2021 she sent Rx for 0.'0375mg'$  dose. ?  ?I left message for patient to call to clarify. Pharmacy will get the new Rx ready. ?

## 2022-01-19 DIAGNOSIS — M459 Ankylosing spondylitis of unspecified sites in spine: Secondary | ICD-10-CM | POA: Diagnosis not present

## 2022-01-23 DIAGNOSIS — M25661 Stiffness of right knee, not elsewhere classified: Secondary | ICD-10-CM | POA: Diagnosis not present

## 2022-01-23 DIAGNOSIS — M6281 Muscle weakness (generalized): Secondary | ICD-10-CM | POA: Diagnosis not present

## 2022-01-23 DIAGNOSIS — M25561 Pain in right knee: Secondary | ICD-10-CM | POA: Diagnosis not present

## 2022-01-25 DIAGNOSIS — M6281 Muscle weakness (generalized): Secondary | ICD-10-CM | POA: Diagnosis not present

## 2022-01-25 DIAGNOSIS — M25561 Pain in right knee: Secondary | ICD-10-CM | POA: Diagnosis not present

## 2022-01-25 DIAGNOSIS — M25661 Stiffness of right knee, not elsewhere classified: Secondary | ICD-10-CM | POA: Diagnosis not present

## 2022-01-30 DIAGNOSIS — M25661 Stiffness of right knee, not elsewhere classified: Secondary | ICD-10-CM | POA: Diagnosis not present

## 2022-01-30 DIAGNOSIS — M25561 Pain in right knee: Secondary | ICD-10-CM | POA: Diagnosis not present

## 2022-01-30 DIAGNOSIS — M6281 Muscle weakness (generalized): Secondary | ICD-10-CM | POA: Diagnosis not present

## 2022-02-01 DIAGNOSIS — M25661 Stiffness of right knee, not elsewhere classified: Secondary | ICD-10-CM | POA: Diagnosis not present

## 2022-02-01 DIAGNOSIS — M25561 Pain in right knee: Secondary | ICD-10-CM | POA: Diagnosis not present

## 2022-02-01 DIAGNOSIS — M6281 Muscle weakness (generalized): Secondary | ICD-10-CM | POA: Diagnosis not present

## 2022-02-06 DIAGNOSIS — M6281 Muscle weakness (generalized): Secondary | ICD-10-CM | POA: Diagnosis not present

## 2022-02-06 DIAGNOSIS — M25661 Stiffness of right knee, not elsewhere classified: Secondary | ICD-10-CM | POA: Diagnosis not present

## 2022-02-06 DIAGNOSIS — M25561 Pain in right knee: Secondary | ICD-10-CM | POA: Diagnosis not present

## 2022-02-08 DIAGNOSIS — M6281 Muscle weakness (generalized): Secondary | ICD-10-CM | POA: Diagnosis not present

## 2022-02-08 DIAGNOSIS — M25661 Stiffness of right knee, not elsewhere classified: Secondary | ICD-10-CM | POA: Diagnosis not present

## 2022-02-08 DIAGNOSIS — M25561 Pain in right knee: Secondary | ICD-10-CM | POA: Diagnosis not present

## 2022-02-08 DIAGNOSIS — Z9889 Other specified postprocedural states: Secondary | ICD-10-CM | POA: Diagnosis not present

## 2022-02-13 DIAGNOSIS — M25561 Pain in right knee: Secondary | ICD-10-CM | POA: Diagnosis not present

## 2022-02-13 DIAGNOSIS — I10 Essential (primary) hypertension: Secondary | ICD-10-CM | POA: Diagnosis not present

## 2022-02-13 DIAGNOSIS — M1990 Unspecified osteoarthritis, unspecified site: Secondary | ICD-10-CM | POA: Diagnosis not present

## 2022-02-13 DIAGNOSIS — L570 Actinic keratosis: Secondary | ICD-10-CM | POA: Diagnosis not present

## 2022-02-13 DIAGNOSIS — M899 Disorder of bone, unspecified: Secondary | ICD-10-CM | POA: Diagnosis not present

## 2022-02-13 DIAGNOSIS — M6281 Muscle weakness (generalized): Secondary | ICD-10-CM | POA: Diagnosis not present

## 2022-02-13 DIAGNOSIS — Z85828 Personal history of other malignant neoplasm of skin: Secondary | ICD-10-CM | POA: Diagnosis not present

## 2022-02-13 DIAGNOSIS — Z1331 Encounter for screening for depression: Secondary | ICD-10-CM | POA: Diagnosis not present

## 2022-02-13 DIAGNOSIS — D485 Neoplasm of uncertain behavior of skin: Secondary | ICD-10-CM | POA: Diagnosis not present

## 2022-02-13 DIAGNOSIS — E7800 Pure hypercholesterolemia, unspecified: Secondary | ICD-10-CM | POA: Diagnosis not present

## 2022-02-13 DIAGNOSIS — Z0000 Encounter for general adult medical examination without abnormal findings: Secondary | ICD-10-CM | POA: Diagnosis not present

## 2022-02-13 DIAGNOSIS — K219 Gastro-esophageal reflux disease without esophagitis: Secondary | ICD-10-CM | POA: Diagnosis not present

## 2022-02-13 DIAGNOSIS — E875 Hyperkalemia: Secondary | ICD-10-CM | POA: Diagnosis not present

## 2022-02-13 DIAGNOSIS — G479 Sleep disorder, unspecified: Secondary | ICD-10-CM | POA: Diagnosis not present

## 2022-02-13 DIAGNOSIS — E871 Hypo-osmolality and hyponatremia: Secondary | ICD-10-CM | POA: Diagnosis not present

## 2022-02-13 DIAGNOSIS — C44319 Basal cell carcinoma of skin of other parts of face: Secondary | ICD-10-CM | POA: Diagnosis not present

## 2022-02-13 DIAGNOSIS — M25661 Stiffness of right knee, not elsewhere classified: Secondary | ICD-10-CM | POA: Diagnosis not present

## 2022-02-20 DIAGNOSIS — E871 Hypo-osmolality and hyponatremia: Secondary | ICD-10-CM | POA: Diagnosis not present

## 2022-02-28 ENCOUNTER — Other Ambulatory Visit: Payer: Self-pay

## 2022-02-28 MED ORDER — NONFORMULARY OR COMPOUNDED ITEM: 0 refills | Status: DC

## 2022-02-28 NOTE — Telephone Encounter (Signed)
AEX 10/27/2021  "Uses testosterone replacement twice a week.  ?Last testosterone level normal 07/25/21." ? ?Patient is requesting refill on Testosterone 2% ointment. ?

## 2022-02-28 NOTE — Telephone Encounter (Signed)
Rx refilled by Dr. Quincy Simmonds. ? ?I phone it in to [facility] with no refills. ? ?Patient informed. ?

## 2022-03-03 DIAGNOSIS — H353131 Nonexudative age-related macular degeneration, bilateral, early dry stage: Secondary | ICD-10-CM | POA: Diagnosis not present

## 2022-03-03 DIAGNOSIS — H5213 Myopia, bilateral: Secondary | ICD-10-CM | POA: Diagnosis not present

## 2022-03-03 DIAGNOSIS — Z961 Presence of intraocular lens: Secondary | ICD-10-CM | POA: Diagnosis not present

## 2022-03-03 DIAGNOSIS — H52203 Unspecified astigmatism, bilateral: Secondary | ICD-10-CM | POA: Diagnosis not present

## 2022-03-08 DIAGNOSIS — H903 Sensorineural hearing loss, bilateral: Secondary | ICD-10-CM | POA: Diagnosis not present

## 2022-03-16 DIAGNOSIS — M459 Ankylosing spondylitis of unspecified sites in spine: Secondary | ICD-10-CM | POA: Diagnosis not present

## 2022-04-13 DIAGNOSIS — L820 Inflamed seborrheic keratosis: Secondary | ICD-10-CM | POA: Diagnosis not present

## 2022-04-13 DIAGNOSIS — Z85828 Personal history of other malignant neoplasm of skin: Secondary | ICD-10-CM | POA: Diagnosis not present

## 2022-04-13 DIAGNOSIS — L720 Epidermal cyst: Secondary | ICD-10-CM | POA: Diagnosis not present

## 2022-04-13 DIAGNOSIS — L570 Actinic keratosis: Secondary | ICD-10-CM | POA: Diagnosis not present

## 2022-04-19 DIAGNOSIS — I10 Essential (primary) hypertension: Secondary | ICD-10-CM | POA: Diagnosis not present

## 2022-04-19 DIAGNOSIS — E871 Hypo-osmolality and hyponatremia: Secondary | ICD-10-CM | POA: Diagnosis not present

## 2022-04-26 DIAGNOSIS — R059 Cough, unspecified: Secondary | ICD-10-CM | POA: Diagnosis not present

## 2022-04-26 DIAGNOSIS — M0600 Rheumatoid arthritis without rheumatoid factor, unspecified site: Secondary | ICD-10-CM | POA: Diagnosis not present

## 2022-05-11 DIAGNOSIS — Z111 Encounter for screening for respiratory tuberculosis: Secondary | ICD-10-CM | POA: Diagnosis not present

## 2022-05-11 DIAGNOSIS — R5383 Other fatigue: Secondary | ICD-10-CM | POA: Diagnosis not present

## 2022-05-11 DIAGNOSIS — M459 Ankylosing spondylitis of unspecified sites in spine: Secondary | ICD-10-CM | POA: Diagnosis not present

## 2022-05-11 DIAGNOSIS — Z79899 Other long term (current) drug therapy: Secondary | ICD-10-CM | POA: Diagnosis not present

## 2022-06-01 DIAGNOSIS — M1991 Primary osteoarthritis, unspecified site: Secondary | ICD-10-CM | POA: Diagnosis not present

## 2022-06-01 DIAGNOSIS — Z1589 Genetic susceptibility to other disease: Secondary | ICD-10-CM | POA: Diagnosis not present

## 2022-06-01 DIAGNOSIS — L409 Psoriasis, unspecified: Secondary | ICD-10-CM | POA: Diagnosis not present

## 2022-06-01 DIAGNOSIS — M0609 Rheumatoid arthritis without rheumatoid factor, multiple sites: Secondary | ICD-10-CM | POA: Diagnosis not present

## 2022-06-01 DIAGNOSIS — M459 Ankylosing spondylitis of unspecified sites in spine: Secondary | ICD-10-CM | POA: Diagnosis not present

## 2022-06-01 DIAGNOSIS — M154 Erosive (osteo)arthritis: Secondary | ICD-10-CM | POA: Diagnosis not present

## 2022-06-01 DIAGNOSIS — Z79899 Other long term (current) drug therapy: Secondary | ICD-10-CM | POA: Diagnosis not present

## 2022-06-01 DIAGNOSIS — Z681 Body mass index (BMI) 19 or less, adult: Secondary | ICD-10-CM | POA: Diagnosis not present

## 2022-06-13 ENCOUNTER — Other Ambulatory Visit: Payer: Self-pay

## 2022-06-13 ENCOUNTER — Other Ambulatory Visit: Payer: Self-pay | Admitting: *Deleted

## 2022-06-13 MED ORDER — NONFORMULARY OR COMPOUNDED ITEM: 0 refills | Status: DC

## 2022-06-13 NOTE — Telephone Encounter (Signed)
Last annual exam 10/27/21

## 2022-06-13 NOTE — Telephone Encounter (Signed)
Encounter not needed

## 2022-06-14 NOTE — Telephone Encounter (Signed)
[facility]

## 2022-06-16 DIAGNOSIS — M069 Rheumatoid arthritis, unspecified: Secondary | ICD-10-CM | POA: Diagnosis not present

## 2022-06-16 DIAGNOSIS — M150 Primary generalized (osteo)arthritis: Secondary | ICD-10-CM | POA: Diagnosis not present

## 2022-06-16 DIAGNOSIS — G894 Chronic pain syndrome: Secondary | ICD-10-CM | POA: Diagnosis not present

## 2022-06-16 DIAGNOSIS — M154 Erosive (osteo)arthritis: Secondary | ICD-10-CM | POA: Diagnosis not present

## 2022-06-17 ENCOUNTER — Other Ambulatory Visit: Payer: Self-pay | Admitting: Obstetrics and Gynecology

## 2022-06-17 DIAGNOSIS — N952 Postmenopausal atrophic vaginitis: Secondary | ICD-10-CM

## 2022-06-21 NOTE — Telephone Encounter (Signed)
Last AEX 10/27/2021--scheduled for 11/02/2022. Last mammo 11/07/2021-birads 0, Dx Lt 11/17/2021-birads 4, Lt Bx 12/01/2021-fibrocystic changes.  Pharmacy reports last refill of cream on 05/19/2022, should last pt ~7 months given sig. Will refuse for now and report will refill if desired and appropriate at appt in 10/2022.

## 2022-06-22 DIAGNOSIS — M459 Ankylosing spondylitis of unspecified sites in spine: Secondary | ICD-10-CM | POA: Diagnosis not present

## 2022-06-26 DIAGNOSIS — M1711 Unilateral primary osteoarthritis, right knee: Secondary | ICD-10-CM | POA: Diagnosis not present

## 2022-07-03 DIAGNOSIS — M7541 Impingement syndrome of right shoulder: Secondary | ICD-10-CM | POA: Diagnosis not present

## 2022-07-03 DIAGNOSIS — M1711 Unilateral primary osteoarthritis, right knee: Secondary | ICD-10-CM | POA: Diagnosis not present

## 2022-07-03 DIAGNOSIS — R42 Dizziness and giddiness: Secondary | ICD-10-CM | POA: Diagnosis not present

## 2022-07-03 DIAGNOSIS — Z23 Encounter for immunization: Secondary | ICD-10-CM | POA: Diagnosis not present

## 2022-07-03 DIAGNOSIS — R000 Tachycardia, unspecified: Secondary | ICD-10-CM | POA: Diagnosis not present

## 2022-07-09 ENCOUNTER — Other Ambulatory Visit: Payer: Self-pay | Admitting: Obstetrics and Gynecology

## 2022-07-09 DIAGNOSIS — Z79899 Other long term (current) drug therapy: Secondary | ICD-10-CM

## 2022-07-10 DIAGNOSIS — M1711 Unilateral primary osteoarthritis, right knee: Secondary | ICD-10-CM | POA: Diagnosis not present

## 2022-07-10 NOTE — Telephone Encounter (Signed)
Last AEX 10/27/2021--scheduled for 11/02/2022 Last mammo 11/07/2021-birads 0, Lt Dx-11/17/2021-birads 4 suspicious, Lt breast Bx-12/01/2021--ductal hyperplasia, no malignancy.

## 2022-07-13 DIAGNOSIS — M154 Erosive (osteo)arthritis: Secondary | ICD-10-CM | POA: Diagnosis not present

## 2022-07-13 DIAGNOSIS — Z79899 Other long term (current) drug therapy: Secondary | ICD-10-CM | POA: Diagnosis not present

## 2022-07-13 DIAGNOSIS — M069 Rheumatoid arthritis, unspecified: Secondary | ICD-10-CM | POA: Diagnosis not present

## 2022-07-13 DIAGNOSIS — G894 Chronic pain syndrome: Secondary | ICD-10-CM | POA: Diagnosis not present

## 2022-07-17 ENCOUNTER — Telehealth: Payer: Self-pay | Admitting: *Deleted

## 2022-07-17 NOTE — Telephone Encounter (Signed)
Patient scheduled on 07/20/22

## 2022-07-17 NOTE — Telephone Encounter (Signed)
Patient called and left detailed message in triage voicemail c/o what she believes is vaginitis x 1 week now has treated with OTC and no relief.  Reports she is leaving out of town on Monday and would like to be worked in to be seen front desk offered 10/2 and 10/3 patient reports she can't make those days or times. Asked to be worked in. She asked I call back and leave message on voicemail if this could be done. I called back and left detailed message that she can see Jami, NP on Thursday or Friday this week to treat her before she leave for her trip and to call to schedule.

## 2022-07-17 NOTE — Telephone Encounter (Signed)
I am happy to see her Wednesday afternoon if that works for her.

## 2022-07-19 NOTE — Progress Notes (Unsigned)
GYNECOLOGY  VISIT   HPI: 88 y.o.   Married  Caucasian  female   G2P2001 with No LMP recorded. Patient has had a hysterectomy.   here for vaginal burning & discharge starting 10 days ago.  Tired Vagisil, symptoms are some improved.   No vaginal odor.   Some pain with urination when the urine touches the outside.  No dysuria.   No pain use.   No recent abx.   No recent trips to the beach.  Using vaginal estrogen for atrophy. Last tx 3 days ago.    She also uses testosterone therapy.   GYNECOLOGIC HISTORY: No LMP recorded. Patient has had a hysterectomy. Contraception:  hysterectomy Menopausal hormone therapy:  premarin vaginal cream, vivelle dot patch Last mammogram:  11-07-21 bilateral, 2/23 left breast imaging & left breast biopsy Last pap smear:   7/09 neg        OB History     Gravida  2   Para  2   Term  2   Preterm      AB      Living  1      SAB      IAB      Ectopic      Multiple      Live Births  2              Patient Active Problem List   Diagnosis Date Noted   Presbycusis of both ears 12/02/2018   S/P reverse total shoulder arthroplasty, left 04/09/2018   Chronic left shoulder pain 03/20/2018   Sensorineural hearing loss (SNHL), bilateral 01/03/2017   Bilateral impacted cerumen 07/03/2016   Rhinitis, chronic 07/03/2016   H/O thrombocytosis 01/29/2012   Neck pain, acute 08/31/2011   Arthritis of hand 08/31/2011   Supraventricular tachycardia 08/31/2011   Salmonella enteritis 08/28/2011   Muscle spasm of back 08/28/2011   Dehydration 08/28/2011   GERD (gastroesophageal reflux disease) 08/28/2011   DJD (degenerative joint disease) 08/28/2011   Rotator cuff tear, left 08/28/2011   ARF (acute renal failure) ([place]) 08/28/2011    Past Medical History:  Diagnosis Date   Arthritis    "osteoarthritis"   Diarrhea 08/28/11   "for the last 17 days; from [place] poisoning"   GERD (gastroesophageal reflux disease)    Headache(784.0)  08/28/11   "just for the last few days; releated to dehydration"   Hearing aid worn    Shortness of breath 08/28/11   "hard time breathing deeply because of the pain"    Past Surgical History:  Procedure Laterality Date   BACK SURGERY  10/2004   "spinal stenosis"   CATARACT EXTRACTION W/ INTRAOCULAR LENS  IMPLANT, BILATERAL  Summer 2011   FACIAL COSMETIC SURGERY  ~ 2002   FINGER SURGERY  ~ 2000   "titanium rod in right pointer; from arthritis"   FOOT SURGERY Right    KNEE ARTHROSCOPY Right    4/23   SHOULDER SURGERY Left 03/2018   TONSILLECTOMY  1943   TUBAL LIGATION  1970's   VAGINAL HYSTERECTOMY  11/2002   "still have my ovaries"    Current Outpatient Medications  Medication Sig Dispense Refill   Acetaminophen (TYLENOL EXTRA STRENGTH PO) Take by mouth.     B Complex Vitamins (B-COMPLEX/B-12 PO) Take by mouth daily.     betamethasone dipropionate 0.05 % cream SMARTSIG:Sparingly Topical Twice Daily     Biotin 10 MG TABS Take by mouth.     clobetasol (TEMOVATE) 0.05 % external solution SMARTSIG:1  Milliliter(s) Topical Every Night     conjugated estrogens (PREMARIN) vaginal cream Place 0.5 mg vaginally two times a week at bedtime. 30 g 1   diclofenac sodium (VOLTAREN) 1 % GEL      estradiol (VIVELLE-DOT) 0.0375 MG/24HR APPLY 1/2 PATCH TOPICALLY TO THE SKIN 2 TIMES A WEEK 8 patch 1   hydroxychloroquine (PLAQUENIL) 200 MG tablet Take 200 mg by mouth daily.     inFLIXimab-abda (RENFLEXIS IV) Inject into the vein.     ketoconazole (NIZORAL) 2 % cream Apply topically daily.     metoprolol succinate (TOPROL-XL) 25 MG 24 hr tablet Takes 1/2     Multiple Vitamin (MULTIVITAMIN ADULT) TABS      naloxone (NARCAN) nasal spray 4 mg/0.1 mL SMARTSIG:Both Nares     NONFORMULARY OR COMPOUNDED ITEM Testosterone Propionate Petrolatum (jar) 2% ointment Disp 2 Gm. S: APPLY ONE TEAR-DROP SIZE AMOUNT TO INNER THIGH AREA AT BEDTIME 1 TIME A WEEK.  ALTERNATE THIGHS. 60 each 0   OVER THE COUNTER  MEDICATION Place 1 drop into both eyes 2 (two) times daily as needed (redness/ dry eyes). Over the counter eye drops     oxyCODONE (OXY IR/ROXICODONE) 5 MG immediate release tablet Take 5 mg by mouth at bedtime as needed.     Oyster Shell Calcium 500 MG TABS 1 tablet with meals     pantoprazole (PROTONIX) 20 MG tablet Take 20 mg by mouth daily.     RESTASIS 0.05 % ophthalmic emulsion INT 1 GTT INTO OU BID     valACYclovir (VALTREX) 1000 MG tablet as needed.      zolpidem (AMBIEN) 10 MG tablet Take 5-10 mg by mouth at bedtime as needed.     No current facility-administered medications for this visit.     ALLERGIES: Sulfa antibiotics  Family History  Problem Relation Age of Onset   Cancer Father        colon    Social History   Socioeconomic History   Marital status: Married    Spouse name: Not on file   Number of children: Not on file   Years of education: Not on file   Highest education level: Not on file  Occupational History   Not on file  Tobacco Use   Smoking status: Former    Packs/day: 1.50    Years: 30.00    Total pack years: 45.00    Types: Cigarettes   Smokeless tobacco: Never   Tobacco comments:    quit 30 years go   Vaping Use   Vaping Use: Never used  Substance and Sexual Activity   Alcohol use: Yes    Alcohol/week: 7.0 standard drinks of alcohol    Types: 7 Standard drinks or equivalent per week   Drug use: No   Sexual activity: Yes    Partners: Male    Birth control/protection: Surgical    Comment: hysterectomy  Other Topics Concern   Not on file  Social History Narrative   Not on file   Social Determinants of Health   Financial Resource Strain: Not on file  Food Insecurity: Not on file  Transportation Needs: Not on file  Physical Activity: Not on file  Stress: Not on file  Social Connections: Not on file  Intimate Partner Violence: Not on file    Review of Systems  Constitutional: Negative.   HENT: Negative.    Eyes: Negative.    Respiratory: Negative.    Cardiovascular: Negative.   Gastrointestinal: Negative.   Endocrine: Negative.  Genitourinary:        Vaginal burning & discharge  Musculoskeletal: Negative.   Skin: Negative.   Allergic/Immunologic: Negative.   Neurological: Negative.   Hematological: Negative.   Psychiatric/Behavioral: Negative.      PHYSICAL EXAMINATION:    BP 112/70   Pulse 68   Resp 16   Wt 105 lb (47.6 kg)   BMI 19.20 kg/m     General appearance: alert, cooperative and appears stated age   Pelvic: External genitalia: generalized erythema of the vulva              Urethra:  normal appearing urethra with no masses, tenderness or lesions              Bartholins and Skenes: normal                 Vagina: normal appearing vagina with normal color and clumpy green discharge, no lesions              Cervix: no lesions                Bimanual Exam:  Uterus:  normal size, contour, position, consistency, mobility, non-tender              Adnexa: no mass, fullness, tenderness    Chaperone was present for exam:  Joy, CMA  ASSESSMENT  Vulvovaginitis: yeast.   PLAN  Wet prep:  yeast present.  No clue cells or trichomonas.  Diflucan 150 mg po x 1.  May repeat in 72 hours prn.  #2, RF none.  Fu prn.    An After Visit Summary was printed and given to the patient.

## 2022-07-20 ENCOUNTER — Encounter: Payer: Self-pay | Admitting: Obstetrics and Gynecology

## 2022-07-20 ENCOUNTER — Ambulatory Visit: Payer: PPO | Admitting: Obstetrics and Gynecology

## 2022-07-20 VITALS — BP 112/70 | HR 68 | Resp 16 | Wt 105.0 lb

## 2022-07-20 DIAGNOSIS — B379 Candidiasis, unspecified: Secondary | ICD-10-CM

## 2022-07-20 DIAGNOSIS — N760 Acute vaginitis: Secondary | ICD-10-CM | POA: Diagnosis not present

## 2022-07-20 LAB — WET PREP FOR TRICH, YEAST, CLUE

## 2022-07-20 MED ORDER — FLUCONAZOLE 150 MG PO TABS: 150.0000 mg | ORAL_TABLET | Freq: Once | ORAL | 0 refills | Status: AC

## 2022-07-20 NOTE — Patient Instructions (Signed)

## 2022-08-17 DIAGNOSIS — Z79899 Other long term (current) drug therapy: Secondary | ICD-10-CM | POA: Diagnosis not present

## 2022-08-17 DIAGNOSIS — M459 Ankylosing spondylitis of unspecified sites in spine: Secondary | ICD-10-CM | POA: Diagnosis not present

## 2022-08-21 DIAGNOSIS — Z85828 Personal history of other malignant neoplasm of skin: Secondary | ICD-10-CM | POA: Diagnosis not present

## 2022-08-21 DIAGNOSIS — L821 Other seborrheic keratosis: Secondary | ICD-10-CM | POA: Diagnosis not present

## 2022-08-28 ENCOUNTER — Other Ambulatory Visit: Payer: Self-pay | Admitting: Obstetrics and Gynecology

## 2022-08-28 DIAGNOSIS — N952 Postmenopausal atrophic vaginitis: Secondary | ICD-10-CM

## 2022-08-29 NOTE — Telephone Encounter (Signed)
Med refill request: Premarin Vaginal Cream  Last AEX: 10/27/21 / BS Next AEX: 11/02/22 / BS Last MMG (if hormonal med) 12/08/21 Left breast Bx for calcifications. Fibrocystic changes, return to routine screening.   Last RX sent 10/27/21. 30 g/1 RF  Refill authorized: Please Advise?

## 2022-08-31 DIAGNOSIS — E7800 Pure hypercholesterolemia, unspecified: Secondary | ICD-10-CM | POA: Diagnosis not present

## 2022-08-31 DIAGNOSIS — I10 Essential (primary) hypertension: Secondary | ICD-10-CM | POA: Diagnosis not present

## 2022-08-31 DIAGNOSIS — E559 Vitamin D deficiency, unspecified: Secondary | ICD-10-CM | POA: Diagnosis not present

## 2022-08-31 DIAGNOSIS — D473 Essential (hemorrhagic) thrombocythemia: Secondary | ICD-10-CM | POA: Diagnosis not present

## 2022-08-31 DIAGNOSIS — E871 Hypo-osmolality and hyponatremia: Secondary | ICD-10-CM | POA: Diagnosis not present

## 2022-08-31 DIAGNOSIS — M0600 Rheumatoid arthritis without rheumatoid factor, unspecified site: Secondary | ICD-10-CM | POA: Diagnosis not present

## 2022-08-31 DIAGNOSIS — M810 Age-related osteoporosis without current pathological fracture: Secondary | ICD-10-CM | POA: Diagnosis not present

## 2022-09-11 ENCOUNTER — Ambulatory Visit (INDEPENDENT_AMBULATORY_CARE_PROVIDER_SITE_OTHER): Payer: PPO | Admitting: Nurse Practitioner

## 2022-09-11 ENCOUNTER — Encounter: Payer: Self-pay | Admitting: Nurse Practitioner

## 2022-09-11 VITALS — BP 92/60 | HR 48

## 2022-09-11 DIAGNOSIS — R300 Dysuria: Secondary | ICD-10-CM

## 2022-09-11 DIAGNOSIS — N898 Other specified noninflammatory disorders of vagina: Secondary | ICD-10-CM

## 2022-09-11 DIAGNOSIS — B3731 Acute candidiasis of vulva and vagina: Secondary | ICD-10-CM | POA: Diagnosis not present

## 2022-09-11 LAB — WET PREP FOR TRICH, YEAST, CLUE

## 2022-09-11 MED ORDER — FLUCONAZOLE 150 MG PO TABS: 150.0000 mg | ORAL_TABLET | ORAL | 0 refills | Status: DC

## 2022-09-11 NOTE — Progress Notes (Signed)
   Acute Office Visit  Subjective:    Patient ID: [name], female    DOB: 08/14/1933, 88 y.o.   MRN: [id number]   HPI 88 y.o. presents today for dysuria, urgency, frequency, vulvar irritation, discharge and mild odor that started 5 days ago. Uses vaginal estrogen for atrophy. Treated for yeast infection 07/20/22.    Review of Systems  Constitutional: Negative.  Negative for fever.  Genitourinary:  Positive for dysuria, frequency, urgency, vaginal discharge and vaginal pain (Vulvar irritation). Negative for difficulty urinating, flank pain and hematuria.       Objective:    Physical Exam Constitutional:      Appearance: Normal appearance.  Abdominal:     Tenderness: There is no right CVA tenderness or left CVA tenderness.  Genitourinary:    Comments: Mild redness Atrophic changes    BP 92/60   Pulse (!) 48   SpO2 91%  Wt Readings from Last 3 Encounters:  07/20/22 105 lb (47.6 kg)  10/27/21 107 lb (48.5 kg)  10/26/20 105 lb (47.6 kg)        Patient informed chaperone available to be present for breast and/or pelvic exam. Patient has requested no chaperone to be present. Patient has been advised what will be completed during breast and pelvic exam.   Wet prep + yeast UA 2+ leukocytes, negative nitrites, trace blood, negative protein, yellow/cloudy. Microscopic: wbc 20-40, rbc 0-2, moderate bacteria, few yeast  Assessment & Plan:   Problem List Items Addressed This Visit   None Visit Diagnoses     Vulvovaginal candidiasis    -  Primary   Relevant Medications   fluconazole (DIFLUCAN) 150 MG tablet   Dysuria       Relevant Orders   Urinalysis,Complete w/RFL Culture   Vaginal discharge       Relevant Orders   WET PREP FOR [place], YEAST, CLUE      Plan: Wet prep positive for yeast - Diflucan 150 gm today and repeat in 3 days for total of 2 doses. Option to treat for possible UTI now or wait until culture and she would like to wait. Leukocytosis in UA could be  from vaginal infection. Will call if symptoms change in severity between now and culture results.      Tamela Gammon DNP, 3:40 PM 09/11/2022

## 2022-09-14 LAB — URINALYSIS, COMPLETE W/RFL CULTURE
Bilirubin Urine: NEGATIVE
Glucose, UA: NEGATIVE
Hyaline Cast: NONE SEEN /LPF
Ketones, ur: NEGATIVE
Nitrites, Initial: NEGATIVE
Protein, ur: NEGATIVE
Specific Gravity, Urine: 1.01 (ref 1.001–1.035)
pH: 5.5 (ref 5.0–8.0)

## 2022-09-14 LAB — URINE CULTURE
MICRO NUMBER:: 14233459
SPECIMEN QUALITY:: ADEQUATE

## 2022-09-14 LAB — CULTURE INDICATED

## 2022-09-15 ENCOUNTER — Other Ambulatory Visit: Payer: Self-pay

## 2022-09-15 DIAGNOSIS — N390 Urinary tract infection, site not specified: Secondary | ICD-10-CM

## 2022-09-15 MED ORDER — NITROFURANTOIN MONOHYD MACRO 100 MG PO CAPS: 100.0000 mg | ORAL_CAPSULE | Freq: Two times a day (BID) | ORAL | 0 refills | Status: AC

## 2022-09-17 ENCOUNTER — Encounter: Payer: Self-pay | Admitting: Obstetrics and Gynecology

## 2022-09-17 MED ORDER — SULFAMETHOXAZOLE-TRIMETHOPRIM 800-160 MG PO TABS: 1.0000 | ORAL_TABLET | Freq: Two times a day (BID) | ORAL | 0 refills | Status: AC

## 2022-09-18 DIAGNOSIS — M1711 Unilateral primary osteoarthritis, right knee: Secondary | ICD-10-CM | POA: Diagnosis not present

## 2022-09-28 DIAGNOSIS — M459 Ankylosing spondylitis of unspecified sites in spine: Secondary | ICD-10-CM | POA: Diagnosis not present

## 2022-10-03 ENCOUNTER — Encounter: Payer: Self-pay | Admitting: Internal Medicine

## 2022-10-03 DIAGNOSIS — M810 Age-related osteoporosis without current pathological fracture: Secondary | ICD-10-CM

## 2022-10-12 ENCOUNTER — Other Ambulatory Visit: Payer: Self-pay

## 2022-10-12 DIAGNOSIS — R6882 Decreased libido: Secondary | ICD-10-CM

## 2022-10-12 DIAGNOSIS — G894 Chronic pain syndrome: Secondary | ICD-10-CM | POA: Diagnosis not present

## 2022-10-12 DIAGNOSIS — M154 Erosive (osteo)arthritis: Secondary | ICD-10-CM | POA: Diagnosis not present

## 2022-10-12 DIAGNOSIS — M069 Rheumatoid arthritis, unspecified: Secondary | ICD-10-CM | POA: Diagnosis not present

## 2022-10-12 DIAGNOSIS — Z79899 Other long term (current) drug therapy: Secondary | ICD-10-CM | POA: Diagnosis not present

## 2022-10-12 MED ORDER — NONFORMULARY OR COMPOUNDED ITEM: 0 refills | Status: DC

## 2022-10-12 NOTE — Telephone Encounter (Signed)
Last AEX 10/27/2021--scheduled for 11/02/2022. Last mammo-11/07/2021-birads 0, Lt Dx-11/17/2021-birads 4, Lt Breast Bx-12/01/2021-fibrocystic changes with usual ductal hyperplasia and calcifications.   OK to call in enough refill until scheduled appt?

## 2022-10-12 NOTE — Telephone Encounter (Signed)
Ok to refill 

## 2022-10-12 NOTE — Telephone Encounter (Signed)
LVM to pharmacist.  Requested only enough Rx to last pt ~54monthor closest they can get to it dispense wise since pt has upcoming appt.

## 2022-10-18 ENCOUNTER — Other Ambulatory Visit: Payer: Self-pay

## 2022-10-18 ENCOUNTER — Telehealth: Payer: Self-pay

## 2022-10-18 NOTE — Telephone Encounter (Signed)
Patient called today because she wanted to know why Dr. Quincy Simmonds decreased her quantity of Testosterone Ointment from 60 grams to 10 grams.  She was not pleased with this change.  I did explain that NP Jami had refilled it for her in Dr. Elza Rafter absence.  I looked at the meds on her med list current and previous and it looked like Wende Crease, NP okayed refill for same as Dr. Quincy Simmonds prescribed in August.  I spoke with pharmacy and they said when it was called in they were instructed only to fill a one month supply. Upon reviewing refill in patient station I see Lovena Le, CMA note regarding only providing a month supply until visit 11/02/21.  Do you want to have patient wait until her visit for the additional part of that refill?  Pharmacy said they can adjust and give her the rest of it if you would like.

## 2022-10-18 NOTE — Telephone Encounter (Signed)
I will give her an updated prescription at her office visit this month.

## 2022-10-18 NOTE — Telephone Encounter (Signed)
Spoke with patient and informed her. She was agreeable to wait until visit for the updated Rx.

## 2022-10-18 NOTE — Telephone Encounter (Signed)
Did not need this. Enc already opened.

## 2022-10-19 NOTE — Progress Notes (Signed)
89 y.o. G1P2001 Married Caucasian female here for an office visit.  Pt is concerned about urinary incontinence.   Leakage of urine just happens.  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  No incontinence with laugh or cough.  Underwear is wet all the time.  DF - three or four times a day.  NF- once a night.  Drinks 3 green teas per day. Drinks an 8 ounce black tea per day.   No dysuria or hematuria.   Had a couple of UTIs this year.   Can take a while to start urination flowing. Does not feel like she always empties well.  No vaginal bulge.  Not certain if she had a bladder support procedure with hysterectomy.   No constipation or accidental leakage of stool.  Has dry mouth.   No hx glaucoma.   She is also followed for ERT, vaginal estrogen cream, and testosterone treatment.  She cuts her patch in half.  Also uses vaginal estrogen cream.  Using testosterone for decreased libido.  Wants to continue all of these.  PCP:   Gaynelle Arabian, MD  No LMP recorded. Patient has had a hysterectomy.           Sexually active: Yes.    The current method of family planning is status post hysterectomy.    Exercising: Yes.     Walking 15 miles a week, work with trainer , 20 min of yoga Smoker:  former  Health Maintenance: Pap:  04/2008 neg History of abnormal Pap:  no MMG:  12/08/21 Breast Density Category B, BI-RADS CATEGORY 4: suspicious, left breast biopsy fibrocystic change.  Pt scheduled Jan 29th 2024. Colonoscopy:  n/a BMD:   01/17/16  Result  normal. TDaP:  08/16/2020 Gardasil:   no HIV: no Hep C: no Screening Labs: PCP   reports that she has quit smoking. Her smoking use included cigarettes. She has a 45.00 pack-year smoking history. She has never used smokeless tobacco. She reports current alcohol use of about 7.0 standard drinks of alcohol per week. She reports that she does not use drugs.  Past Medical History:  Diagnosis Date   Arthritis     "osteoarthritis"   Diarrhea 08/28/11   "for the last 17 days; from [place] poisoning"   GERD (gastroesophageal reflux disease)    Headache(784.0) 08/28/11   "just for the last few days; releated to dehydration"   Hearing aid worn    Shortness of breath 08/28/11   "hard time breathing deeply because of the pain"    Past Surgical History:  Procedure Laterality Date   BACK SURGERY  10/2004   "spinal stenosis"   CATARACT EXTRACTION W/ INTRAOCULAR LENS  IMPLANT, BILATERAL  Summer 2011   FACIAL COSMETIC SURGERY  ~ 2002   FINGER SURGERY  ~ 2000   "titanium rod in right pointer; from arthritis"   FOOT SURGERY Right    KNEE ARTHROSCOPY Right    4/23   SHOULDER SURGERY Left 03/2018   TONSILLECTOMY  1943   TUBAL LIGATION  1970's   VAGINAL HYSTERECTOMY  11/2002   "still have my ovaries"    Current Outpatient Medications  Medication Sig Dispense Refill   Acetaminophen (TYLENOL EXTRA STRENGTH PO) Take by mouth.     B Complex Vitamins (B-COMPLEX/B-12 PO) Take by mouth daily.     betamethasone dipropionate 0.05 % cream SMARTSIG:Sparingly Topical Twice Daily     Biotin 10 MG TABS Take by mouth.  Calcium Citrate (CITRACAL PO) Take by mouth.     clobetasol (TEMOVATE) 0.05 % external solution SMARTSIG:1 Milliliter(s) Topical Every Night     conjugated estrogens (PREMARIN) vaginal cream PLACE 0.5 MG VAGINALLY 2 TIMES A WEEK AT BEDTIME 30 g 0   diclofenac sodium (VOLTAREN) 1 % GEL      estradiol (VIVELLE-DOT) 0.0375 MG/24HR APPLY 1/2 PATCH TOPICALLY TO THE SKIN 2 TIMES A WEEK 8 patch 1   hydroxychloroquine (PLAQUENIL) 200 MG tablet Take 200 mg by mouth daily.     inFLIXimab-abda (RENFLEXIS IV) Inject into the vein.     ipratropium (ATROVENT) 0.06 % nasal spray SMARTSIG:2 Spray(s) Both Nares 3 Times Daily PRN     metoprolol succinate (TOPROL-XL) 25 MG 24 hr tablet Takes 1/2     Multiple Vitamin (MULTIVITAMIN ADULT) TABS      naloxone (NARCAN) nasal spray 4 mg/0.1 mL SMARTSIG:Both Nares      NONFORMULARY OR COMPOUNDED ITEM Testosterone Propionate Petrolatum (jar) 2% ointment Disp 2 Gm. S: APPLY ONE TEAR-DROP SIZE AMOUNT TO INNER THIGH AREA AT BEDTIME 1 TIME A WEEK.  ALTERNATE THIGHS. 60 each 0   OVER THE COUNTER MEDICATION Place 1 drop into both eyes 2 (two) times daily as needed (redness/ dry eyes). Over the counter eye drops     oxyCODONE (OXY IR/ROXICODONE) 5 MG immediate release tablet Take 5 mg by mouth at bedtime as needed.     pantoprazole (PROTONIX) 20 MG tablet Take 20 mg by mouth daily.     RESTASIS 0.05 % ophthalmic emulsion INT 1 GTT INTO OU BID     valACYclovir (VALTREX) 1000 MG tablet as needed.      zolpidem (AMBIEN) 10 MG tablet Take 5-10 mg by mouth at bedtime as needed.     No current facility-administered medications for this visit.    Family History  Problem Relation Age of Onset   Cancer Father        colon    Review of Systems  All other systems reviewed and are negative.   Exam:   BP 118/72 (BP Location: Left Arm, Patient Position: Sitting, Cuff Size: Normal)   Pulse (!) 52   Ht 5' 2.5" (1.588 m)   Wt 106 lb (48.1 kg)   SpO2 96%   BMI 19.08 kg/m     General appearance: alert, cooperative and appears stated age Head: normocephalic, without obvious abnormality, atraumatic Neck: no adenopathy, supple, symmetrical, trachea midline and thyroid normal to inspection and palpation Lungs: clear to auscultation bilaterally Breasts: normal appearance, no masses or tenderness, No nipple retraction or dimpling, No nipple discharge or bleeding, No axillary adenopathy Heart: regular rate and rhythm Abdomen: soft, non-tender; no masses, no organomegaly Extremities: extremities normal, atraumatic, no cyanosis or edema Skin: skin color, texture, turgor normal. No rashes or lesions Lymph nodes: cervical, supraclavicular, and axillary nodes normal. Neurologic: grossly normal  Pelvic: External genitalia:  no lesions              No abnormal inguinal nodes  palpated.              Urethra:  normal appearing urethra with no masses, tenderness or lesions              Bartholins and Skenes: normal                 Vagina: normal appearing vagina with normal color and discharge, no lesions              Cervix:  absent              Pap taken: no Bimanual Exam:  Uterus:  absent              Adnexa: no mass, fullness, tenderness              Rectal exam: yes.  Confirms.              Anus:  normal sphincter tone, no lesions  Chaperone was present for exam:  Emily  Assessment:   Well woman visit with gynecologic exam. Status post TVH.  Ovaries remain.  On ERT.  Atrophy.  Using local vaginal estrogen.  Decreased libido.  On testosterone therapy.  Medication monitoring encounter.  Hx HSV I.  Oral.  Urinary incontinence.  Potential voiding dysfunction.   Plan: Mammogram screening discussed. Self breast awareness reviewed. Pap and HR HPV as above. Guidelines for Calcium, Vitamin D, regular exercise program including cardiovascular and weight bearing exercise. Will check testosterone levels.  Rx for estrogen patch, refill of testosterone, vaginal estrogen.  Uses [facility] for testosterone.  We discussed potential increased risk of stroke, PE, and DVT with use of hormonal treatment.  Options of care for urinary incontinence discussed:  avoidance of irritants, medical therapy, pelvic floor therapy, and referral to urogynecology.  Patient opts for referral to Dr. Sherlene Shams for urinary incontinence.  Follow up annually and prn.   After visit summary provided.  30 min  total time was spent for this patient encounter, including preparation, face-to-face counseling with the patient, coordination of care, and documentation of the encounter.

## 2022-11-02 ENCOUNTER — Encounter: Payer: Self-pay | Admitting: Obstetrics and Gynecology

## 2022-11-02 ENCOUNTER — Ambulatory Visit (INDEPENDENT_AMBULATORY_CARE_PROVIDER_SITE_OTHER): Payer: PPO | Admitting: Obstetrics and Gynecology

## 2022-11-02 VITALS — BP 118/72 | HR 52 | Ht 62.5 in | Wt 106.0 lb

## 2022-11-02 DIAGNOSIS — R32 Unspecified urinary incontinence: Secondary | ICD-10-CM

## 2022-11-02 DIAGNOSIS — B009 Herpesviral infection, unspecified: Secondary | ICD-10-CM | POA: Diagnosis not present

## 2022-11-02 DIAGNOSIS — Z5181 Encounter for therapeutic drug level monitoring: Secondary | ICD-10-CM

## 2022-11-02 DIAGNOSIS — R6882 Decreased libido: Secondary | ICD-10-CM

## 2022-11-02 DIAGNOSIS — N952 Postmenopausal atrophic vaginitis: Secondary | ICD-10-CM | POA: Diagnosis not present

## 2022-11-02 DIAGNOSIS — Z79890 Hormone replacement therapy (postmenopausal): Secondary | ICD-10-CM

## 2022-11-02 DIAGNOSIS — Z9189 Other specified personal risk factors, not elsewhere classified: Secondary | ICD-10-CM

## 2022-11-02 DIAGNOSIS — Z79899 Other long term (current) drug therapy: Secondary | ICD-10-CM

## 2022-11-02 NOTE — Patient Instructions (Signed)
Urinary Incontinence Urinary incontinence refers to a condition in which a person is unable to control where and when to pass urine. A person with this condition will urinate involuntarily. This means that the person urinates when he or she does not mean to. What are the causes? This condition may be caused by: Medicines. Infections. Constipation. Overactive bladder muscles. Weak bladder muscles. Weak pelvic floor muscles. These muscles provide support for the bladder, intestine, and, in women, the uterus. Enlarged prostate in men. The prostate is a gland near the bladder. When it gets too big, it can pinch the urethra. With the urethra blocked, the bladder can weaken and lose the ability to empty properly. Surgery. Emotional factors, such as anxiety, stress, or post-traumatic stress disorder (PTSD). Spinal cord injury, nerve injury, or other neurological conditions. Pelvic organ prolapse. This happens in women when organs move out of place and into the vagina. This movement can prevent the bladder and urethra from working properly. What increases the risk? The following factors may make you more likely to develop this condition: Age. The older you are, the higher the risk. Obesity. Being physically inactive. Pregnancy and childbirth. Menopause. Diseases that affect the nerves or spinal cord. Long-term, or chronic, coughing. This can increase pressure on the bladder and pelvic floor muscles. What are the signs or symptoms? Symptoms may vary depending on the type of urinary incontinence you have. They include: A sudden urge to urinate, and passing urine involuntarily before you can get to a bathroom (urge incontinence). Suddenly passing urine when doing activities that force urine to pass, such as coughing, laughing, exercising, or sneezing (stress incontinence). Needing to urinate often but urinating only a small amount, or constantly dribbling urine (overflow incontinence). Urinating  because you cannot get to the bathroom in time due to a physical disability, such as arthritis or injury, or due to a communication or thinking problem, such as Alzheimer's disease (functional incontinence). How is this diagnosed? This condition may be diagnosed based on: Your medical history. A physical exam. Tests, such as: Urine tests. X-rays of your kidney and bladder. Ultrasound. CT scan. Cystoscopy. In this procedure, a health care provider inserts a tube with a light and camera (cystoscope) through the urethra and into the bladder to check for problems. Urodynamic testing. These tests assess how well the bladder, urethra, and sphincter can store and release urine. There are different types of urodynamic tests, and they vary depending on what the test is measuring. To help diagnose your condition, your health care provider may recommend that you keep a log of when you urinate and how much you urinate. How is this treated? Treatment for this condition depends on the type of incontinence that you have and its cause. Treatment may include: Lifestyle changes, such as: Quitting smoking. Maintaining a healthy weight. Staying active. Try to get 150 minutes of moderate-intensity exercise every week. Ask your health care provider which activities are safe for you. Eating a healthy diet. Avoid high-fat foods, like fried foods. Avoid refined carbohydrates like white bread and white rice. Limit how much alcohol and caffeine you drink. Increase your fiber intake. Healthy sources of fiber include beans, whole grains, and fresh fruits and vegetables. Behavioral changes, such as: Pelvic floor muscle exercises. Bladder training, such as lengthening the amount of time between bathroom breaks, or using the bathroom at regular intervals. Using techniques to suppress bladder urges. This can include distraction techniques or controlled breathing exercises. Medicines, such as: Medicines to relax the  bladder   muscles and prevent bladder spasms. Medicines to help slow or prevent the growth of a man's prostate. Botox injections. These can help relax the bladder muscles. Treatments, such as: Using pulses of electricity to help change bladder reflexes (electrical nerve stimulation). For women, using a medical device to prevent urine leaks. This is a small, tampon-like, disposable device that is inserted into the urethra. Injecting collagen or carbon beads (bulking agents) into the urinary sphincter. These can help thicken tissue and close the bladder opening. Surgery. Follow these instructions at home: Lifestyle Limit alcohol and caffeine. These can fill your bladder quickly and irritate it. Keep yourself clean to help prevent odors and skin damage. Ask your health care provider about special skin creams and cleansers that can protect the skin from urine. Consider wearing pads or adult diapers. Make sure to change them regularly, and always change them right after experiencing incontinence. General instructions Take over-the-counter and prescription medicines only as told by your health care provider. Use the bathroom about every 3-4 hours, even if you do not feel the need to urinate. Try to empty your bladder completely every time. After urinating, wait a minute. Then try to urinate again. Make sure you are in a relaxed position while urinating. If your incontinence is caused by nerve problems, keep a log of the medicines you take and the times you go to the bathroom. Keep all follow-up visits. This is important. Where to find more information National Institute of Diabetes and Digestive and Kidney Diseases: www.niddk.nih.gov American Urology Association: www.urologyhealth.org Contact a health care provider if: You have pain that gets worse. Your incontinence gets worse. Get help right away if: You have a fever or chills. You are unable to urinate. You have redness in your groin area or  down your legs. Summary Urinary incontinence refers to a condition in which a person is unable to control where and when to pass urine. This condition may be caused by medicines, infection, weak bladder muscles, weak pelvic floor muscles, enlargement of the prostate (in men), or surgery. Factors such as older age, obesity, pregnancy and childbirth, menopause, neurological diseases, and chronic coughing may increase your risk for developing this condition. Types of urinary incontinence include urge incontinence, stress incontinence, overflow incontinence, and functional incontinence. This condition is usually treated first with lifestyle and behavioral changes, such as quitting smoking, eating a healthier diet, and doing regular pelvic floor exercises. Other treatment options include medicines, bulking agents, medical devices, electrical nerve stimulation, or surgery. This information is not intended to replace advice given to you by your health care provider. Make sure you discuss any questions you have with your health care provider. Document Revised: 05/07/2020 Document Reviewed: 05/07/2020 Elsevier Patient Education  2023 Elsevier Inc.  

## 2022-11-05 ENCOUNTER — Other Ambulatory Visit: Payer: Self-pay | Admitting: Obstetrics and Gynecology

## 2022-11-05 DIAGNOSIS — Z79899 Other long term (current) drug therapy: Secondary | ICD-10-CM

## 2022-11-06 ENCOUNTER — Telehealth: Payer: Self-pay | Admitting: Obstetrics and Gynecology

## 2022-11-06 DIAGNOSIS — H6123 Impacted cerumen, bilateral: Secondary | ICD-10-CM | POA: Diagnosis not present

## 2022-11-06 DIAGNOSIS — R32 Unspecified urinary incontinence: Secondary | ICD-10-CM

## 2022-11-06 MED ORDER — PREMARIN 0.625 MG/GM VA CREA: TOPICAL_CREAM | VAGINAL | 1 refills | Status: DC

## 2022-11-06 MED ORDER — NONFORMULARY OR COMPOUNDED ITEM: 1 refills | Status: DC

## 2022-11-06 NOTE — Telephone Encounter (Signed)
Please make referral to Dr. Wannetta Sender, [facility], for urinary incontinence.

## 2022-11-07 LAB — TESTOS,TOTAL,FREE AND SHBG (FEMALE)
Free Testosterone: 0.9 pg/mL (ref 0.2–3.7)
Sex Hormone Binding: 91 nmol/L — ABNORMAL HIGH (ref 14–73)
Testosterone, Total, LC-MS-MS: 16 ng/dL (ref 2–45)

## 2022-11-07 NOTE — Telephone Encounter (Signed)
Referral order placed in Epic. Routed to [facility].

## 2022-11-13 DIAGNOSIS — Z1231 Encounter for screening mammogram for malignant neoplasm of breast: Secondary | ICD-10-CM | POA: Diagnosis not present

## 2022-11-15 ENCOUNTER — Encounter: Payer: Self-pay | Admitting: Obstetrics and Gynecology

## 2022-11-21 DIAGNOSIS — M459 Ankylosing spondylitis of unspecified sites in spine: Secondary | ICD-10-CM | POA: Diagnosis not present

## 2022-11-21 DIAGNOSIS — Z79899 Other long term (current) drug therapy: Secondary | ICD-10-CM | POA: Diagnosis not present

## 2022-11-27 ENCOUNTER — Other Ambulatory Visit: Payer: Self-pay

## 2022-11-27 DIAGNOSIS — R6882 Decreased libido: Secondary | ICD-10-CM

## 2022-11-27 DIAGNOSIS — M1711 Unilateral primary osteoarthritis, right knee: Secondary | ICD-10-CM | POA: Diagnosis not present

## 2022-11-27 NOTE — Telephone Encounter (Signed)
Received refill request from [facility] for patient's testosterone ointment.  Dr. Quincy Simmonds prescribed it on 11/08/22 with one refill but I noticed Rx was set on fax. I confirmed with pharmacy that  they did not receive that Rx and I provided it to them. Pharmacist used the 11/08/22 date that it was actually prescribed.

## 2022-12-01 DIAGNOSIS — M85851 Other specified disorders of bone density and structure, right thigh: Secondary | ICD-10-CM | POA: Diagnosis not present

## 2022-12-01 DIAGNOSIS — Z780 Asymptomatic menopausal state: Secondary | ICD-10-CM | POA: Diagnosis not present

## 2022-12-01 DIAGNOSIS — M85852 Other specified disorders of bone density and structure, left thigh: Secondary | ICD-10-CM | POA: Diagnosis not present

## 2022-12-07 DIAGNOSIS — M154 Erosive (osteo)arthritis: Secondary | ICD-10-CM | POA: Diagnosis not present

## 2022-12-07 DIAGNOSIS — Z79899 Other long term (current) drug therapy: Secondary | ICD-10-CM | POA: Diagnosis not present

## 2022-12-07 DIAGNOSIS — Z1589 Genetic susceptibility to other disease: Secondary | ICD-10-CM | POA: Diagnosis not present

## 2022-12-07 DIAGNOSIS — M459 Ankylosing spondylitis of unspecified sites in spine: Secondary | ICD-10-CM | POA: Diagnosis not present

## 2022-12-07 DIAGNOSIS — L409 Psoriasis, unspecified: Secondary | ICD-10-CM | POA: Diagnosis not present

## 2022-12-07 DIAGNOSIS — Z6820 Body mass index (BMI) 20.0-20.9, adult: Secondary | ICD-10-CM | POA: Diagnosis not present

## 2022-12-07 DIAGNOSIS — M0609 Rheumatoid arthritis without rheumatoid factor, multiple sites: Secondary | ICD-10-CM | POA: Diagnosis not present

## 2022-12-15 NOTE — Telephone Encounter (Signed)
Dr. Tommas Olp office is temporary outting a hold on accepting new patients due to provider going out on leave until August 2024.  Referral sent to Dr. Zigmund Daniel and message left for pt to inform.

## 2023-01-03 DIAGNOSIS — M459 Ankylosing spondylitis of unspecified sites in spine: Secondary | ICD-10-CM | POA: Diagnosis not present

## 2023-01-07 NOTE — Telephone Encounter (Signed)
Please let me know the status of the referral to Dr. [place] Pink.

## 2023-01-08 NOTE — Telephone Encounter (Signed)
Patient was seen 01/04/2023.

## 2023-01-08 NOTE — Telephone Encounter (Signed)
Thank you for the update.  Encounter reviewed and closed.  

## 2023-01-11 DIAGNOSIS — M069 Rheumatoid arthritis, unspecified: Secondary | ICD-10-CM | POA: Diagnosis not present

## 2023-01-11 DIAGNOSIS — M154 Erosive (osteo)arthritis: Secondary | ICD-10-CM | POA: Diagnosis not present

## 2023-01-11 DIAGNOSIS — Z79899 Other long term (current) drug therapy: Secondary | ICD-10-CM | POA: Diagnosis not present

## 2023-01-11 DIAGNOSIS — G894 Chronic pain syndrome: Secondary | ICD-10-CM | POA: Diagnosis not present

## 2023-01-17 DIAGNOSIS — H903 Sensorineural hearing loss, bilateral: Secondary | ICD-10-CM | POA: Diagnosis not present

## 2023-02-19 DIAGNOSIS — E7800 Pure hypercholesterolemia, unspecified: Secondary | ICD-10-CM | POA: Diagnosis not present

## 2023-02-19 DIAGNOSIS — Z0000 Encounter for general adult medical examination without abnormal findings: Secondary | ICD-10-CM | POA: Diagnosis not present

## 2023-02-19 DIAGNOSIS — K219 Gastro-esophageal reflux disease without esophagitis: Secondary | ICD-10-CM | POA: Diagnosis not present

## 2023-02-19 DIAGNOSIS — M0600 Rheumatoid arthritis without rheumatoid factor, unspecified site: Secondary | ICD-10-CM | POA: Diagnosis not present

## 2023-02-19 DIAGNOSIS — G479 Sleep disorder, unspecified: Secondary | ICD-10-CM | POA: Diagnosis not present

## 2023-02-19 DIAGNOSIS — E871 Hypo-osmolality and hyponatremia: Secondary | ICD-10-CM | POA: Diagnosis not present

## 2023-02-19 DIAGNOSIS — M899 Disorder of bone, unspecified: Secondary | ICD-10-CM | POA: Diagnosis not present

## 2023-02-19 DIAGNOSIS — M1990 Unspecified osteoarthritis, unspecified site: Secondary | ICD-10-CM | POA: Diagnosis not present

## 2023-02-19 DIAGNOSIS — D692 Other nonthrombocytopenic purpura: Secondary | ICD-10-CM | POA: Diagnosis not present

## 2023-02-19 DIAGNOSIS — Z1331 Encounter for screening for depression: Secondary | ICD-10-CM | POA: Diagnosis not present

## 2023-02-19 DIAGNOSIS — I10 Essential (primary) hypertension: Secondary | ICD-10-CM | POA: Diagnosis not present

## 2023-02-21 DIAGNOSIS — M1711 Unilateral primary osteoarthritis, right knee: Secondary | ICD-10-CM | POA: Diagnosis not present

## 2023-02-28 DIAGNOSIS — H401130 Primary open-angle glaucoma, bilateral, stage unspecified: Secondary | ICD-10-CM | POA: Diagnosis not present

## 2023-02-28 DIAGNOSIS — H5213 Myopia, bilateral: Secondary | ICD-10-CM | POA: Diagnosis not present

## 2023-02-28 DIAGNOSIS — Z79899 Other long term (current) drug therapy: Secondary | ICD-10-CM | POA: Diagnosis not present

## 2023-02-28 DIAGNOSIS — H5000 Unspecified esotropia: Secondary | ICD-10-CM | POA: Diagnosis not present

## 2023-02-28 DIAGNOSIS — H353132 Nonexudative age-related macular degeneration, bilateral, intermediate dry stage: Secondary | ICD-10-CM | POA: Diagnosis not present

## 2023-02-28 DIAGNOSIS — H532 Diplopia: Secondary | ICD-10-CM | POA: Diagnosis not present

## 2023-02-28 DIAGNOSIS — Z961 Presence of intraocular lens: Secondary | ICD-10-CM | POA: Diagnosis not present

## 2023-03-02 DIAGNOSIS — M79671 Pain in right foot: Secondary | ICD-10-CM | POA: Diagnosis not present

## 2023-03-05 DIAGNOSIS — E7800 Pure hypercholesterolemia, unspecified: Secondary | ICD-10-CM | POA: Diagnosis not present

## 2023-03-05 DIAGNOSIS — I10 Essential (primary) hypertension: Secondary | ICD-10-CM | POA: Diagnosis not present

## 2023-03-05 DIAGNOSIS — M0600 Rheumatoid arthritis without rheumatoid factor, unspecified site: Secondary | ICD-10-CM | POA: Diagnosis not present

## 2023-03-05 DIAGNOSIS — E871 Hypo-osmolality and hyponatremia: Secondary | ICD-10-CM | POA: Diagnosis not present

## 2023-03-08 DIAGNOSIS — R32 Unspecified urinary incontinence: Secondary | ICD-10-CM | POA: Diagnosis not present

## 2023-03-08 DIAGNOSIS — N398 Other specified disorders of urinary system: Secondary | ICD-10-CM | POA: Diagnosis not present

## 2023-03-09 DIAGNOSIS — Z6820 Body mass index (BMI) 20.0-20.9, adult: Secondary | ICD-10-CM | POA: Diagnosis not present

## 2023-03-09 DIAGNOSIS — M154 Erosive (osteo)arthritis: Secondary | ICD-10-CM | POA: Diagnosis not present

## 2023-03-09 DIAGNOSIS — M459 Ankylosing spondylitis of unspecified sites in spine: Secondary | ICD-10-CM | POA: Diagnosis not present

## 2023-03-09 DIAGNOSIS — L409 Psoriasis, unspecified: Secondary | ICD-10-CM | POA: Diagnosis not present

## 2023-03-09 DIAGNOSIS — M0609 Rheumatoid arthritis without rheumatoid factor, multiple sites: Secondary | ICD-10-CM | POA: Diagnosis not present

## 2023-03-09 DIAGNOSIS — Z79899 Other long term (current) drug therapy: Secondary | ICD-10-CM | POA: Diagnosis not present

## 2023-03-14 DIAGNOSIS — Z8719 Personal history of other diseases of the digestive system: Secondary | ICD-10-CM | POA: Diagnosis not present

## 2023-03-14 DIAGNOSIS — K219 Gastro-esophageal reflux disease without esophagitis: Secondary | ICD-10-CM | POA: Diagnosis not present

## 2023-03-14 DIAGNOSIS — R32 Unspecified urinary incontinence: Secondary | ICD-10-CM | POA: Diagnosis not present

## 2023-03-21 ENCOUNTER — Encounter (HOSPITAL_BASED_OUTPATIENT_CLINIC_OR_DEPARTMENT_OTHER): Payer: PPO | Attending: General Surgery | Admitting: Physician Assistant

## 2023-03-21 DIAGNOSIS — H905 Unspecified sensorineural hearing loss: Secondary | ICD-10-CM | POA: Diagnosis not present

## 2023-03-21 DIAGNOSIS — L97822 Non-pressure chronic ulcer of other part of left lower leg with fat layer exposed: Secondary | ICD-10-CM | POA: Diagnosis not present

## 2023-03-21 DIAGNOSIS — M19071 Primary osteoarthritis, right ankle and foot: Secondary | ICD-10-CM | POA: Insufficient documentation

## 2023-03-21 DIAGNOSIS — I10 Essential (primary) hypertension: Secondary | ICD-10-CM | POA: Insufficient documentation

## 2023-03-21 DIAGNOSIS — I7300 Raynaud's syndrome without gangrene: Secondary | ICD-10-CM | POA: Insufficient documentation

## 2023-03-21 DIAGNOSIS — S80812A Abrasion, left lower leg, initial encounter: Secondary | ICD-10-CM | POA: Diagnosis not present

## 2023-03-21 DIAGNOSIS — I872 Venous insufficiency (chronic) (peripheral): Secondary | ICD-10-CM | POA: Diagnosis not present

## 2023-03-22 DIAGNOSIS — D485 Neoplasm of uncertain behavior of skin: Secondary | ICD-10-CM | POA: Diagnosis not present

## 2023-03-22 DIAGNOSIS — C44519 Basal cell carcinoma of skin of other part of trunk: Secondary | ICD-10-CM | POA: Diagnosis not present

## 2023-03-22 DIAGNOSIS — L821 Other seborrheic keratosis: Secondary | ICD-10-CM | POA: Diagnosis not present

## 2023-03-22 DIAGNOSIS — Z85828 Personal history of other malignant neoplasm of skin: Secondary | ICD-10-CM | POA: Diagnosis not present

## 2023-03-23 NOTE — Progress Notes (Signed)
[name], STRUBEL ([id number]) [id number] Page 1 of 4 Visit Report for 03/21/2023 Abuse Risk Screen Details Patient Name: Date of Service: [place], [name] 03/21/2023 1:30 PM Medical Record Number: [id number] Patient Account Number: [id number] Date of Birth/Sex: Treating RN: [birth year] (89 y.o. [name] [facility] Letanya Froh: [name] Other Clinician: Referring Mehki Klumpp: Treating Sanai Frick/Extender: [name] in Treatment: 0 Abuse Risk Screen Items Answer ABUSE RISK SCREEN: Has anyone close to you tried to hurt or harm you recentlyo No Do you feel uncomfortable with anyone in your familyo No Has anyone forced you do things that you didnt want to doo No Electronic Signature(s) Signed: 03/22/2023 6:21:23 PM By: Shawn Stall RN, BSN Entered By: Shawn Stall on 03/21/2023 13:41:24 -------------------------------------------------------------------------------- Activities of Daily Living Details Patient Name: Date of Service: [place], [name] 03/21/2023 1:30 PM Medical Record Number: [id number] Patient Account Number: [id number] Date of Birth/Sex: Treating RN: [birth year] (89 y.o. [name] [facility] Lucius Wise: [name] Other Clinician: Referring Maor Meckel: Treating Janifer Gieselman/Extender: [name] in Treatment: 0 Activities of Daily Living Items Answer Activities of Daily Living (Please select one for each item) Drive Automobile Completely Able T Medications ake Completely Able Use T elephone Completely Able Care for Appearance Completely Able Use T oilet Completely Able Bath / Shower Completely Able Dress Self Completely Able Feed Self Completely Able Walk Completely Able Get In / Out Bed Completely Able Housework Completely Able Prepare Meals Completely Able Handle Money Completely Able Shop for Self Completely Able Electronic Signature(s) Signed: 03/22/2023  6:21:23 PM By: Shawn Stall RN, BSN Entered By: Shawn Stall on 03/21/2023 13:42:06 -------------------------------------------------------------------------------- Education Screening Details Patient Name: Date of Service: [name], [name]. 03/21/2023 1:30 PM Medical Record Number: [id number] Patient Account Number: [id number] Date of Birth/Sex: Treating RN: 1933/02/17 (89 y.o. [name] [facility] Shomari Matusik: [name] Other Clinician: Referring Delmar Dondero: Treating Lawsen Arnott/Extender: Madelyn Flavors, [name] in Treatment: 0 [place], [place] Elbert Ewings ([id number]) [id number] [phone].pdf Page 2 of 4 Primary Learner Assessed: Patient Learning Preferences/Education Level/Primary Language Learning Preference: Explanation, Demonstration, Printed Material Highest Education Level: College or Above Preferred Language: Economist Language Barrier: No Translator Needed: No Memory Deficit: No Emotional Barrier: No Cultural/Religious Beliefs Affecting Medical Care: No Physical Barrier Impaired Vision: No Impaired Hearing: No Decreased Hand dexterity: No Knowledge/Comprehension Knowledge Level: High Comprehension Level: High Ability to understand written instructions: High Ability to understand verbal instructions: High Motivation Anxiety Level: Calm Cooperation: Cooperative Education Importance: Acknowledges Need Interest in Health Problems: Asks Questions Perception: Coherent Willingness to Engage in Self-Management High Activities: Readiness to Engage in Self-Management High Activities: Electronic Signature(s) Signed: 03/22/2023 6:21:23 PM By: Shawn Stall RN, BSN Entered By: Shawn Stall on 03/21/2023 13:42:28 -------------------------------------------------------------------------------- Fall Risk Assessment Details Patient Name: Date of Service: [name], [name]. 03/21/2023 1:30 PM Medical Record Number: [id number] Patient  Account Number: [id number] Date of Birth/Sex: Treating RN: 1933-02-12 (89 y.o. Debara Pickett, [place] Primary Care Salaya Holtrop: [name] Other Clinician: Referring Verenise Moulin: Treating Wade Sigala/Extender: [name] in Treatment: 0 Fall Risk Assessment Items Have you had 2 or more falls in the last 12 monthso 0 No Have you had any fall that resulted in injury in the last 12 monthso 0 No FALLS RISK SCREEN History of falling - immediate or within 3 months 0 No Secondary diagnosis (Do you have 2 or more medical diagnoseso) 0 No  Ambulatory aid None/bed rest/wheelchair/nurse 0 Yes Crutches/cane/walker 0 No Furniture 0 No Intravenous therapy Access/Saline/Heparin Lock 0 No Gait/Transferring Normal/ bed rest/ wheelchair 0 Yes Weak (short steps with or without shuffle, stooped but able to lift head while walking, may seek 0 No support from furniture) Impaired (short steps with shuffle, may have difficulty arising from chair, head down, impaired 0 No balance) Mental Status Oriented to own ability 0 Yes Overestimates or forgets limitations 0 No Risk Level: Low Risk Score: 0 ABIA, [place] ([id number]) [phone] [id number] Page 3 of 4 Electronic Signature(s) -------------------------------------------------------------------------------- Foot Assessment Details Patient Name: Date of Service: [name], [name] 03/21/2023 1:30 PM Medical Record Number: [id number] Patient Account Number: [id number] Date of Birth/Sex: Treating RN: [birth year] (89 y.o. [name] [facility] Warrick Llera: [name] Other Clinician: Referring Brigett Estell: Treating Johnathan Heskett/Extender: [name] in Treatment: 0 Foot Assessment Items Site Locations + = Sensation present, - = Sensation absent, C = Callus, U = Ulcer R = Redness, W = Warmth, M = Maceration, PU = Pre-ulcerative lesion F = Fissure, S = Swelling, D =  Dryness Assessment Right: Left: Other Deformity: No No Prior Foot Ulcer: No No Prior Amputation: No No Charcot Joint: No No Ambulatory Status: Ambulatory Without Help Gait: Steady Electronic Signature(s) Signed: 03/22/2023 6:21:23 PM By: Shawn Stall RN, BSN Entered By: Shawn Stall on 03/21/2023 13:53:04 -------------------------------------------------------------------------------- Nutrition Risk Screening Details Patient Name: Date of Service: [place], [name] 03/21/2023 1:30 PM Medical Record Number: [id number] Patient Account Number: [id number] Date of Birth/Sex: Treating RN: 03/16/1933 (89 y.o. [name] [facility] Alecsander Hattabaugh: [name] Other Clinician: Referring Noah Pelaez: Treating Pearlee Arvizu/Extender: [name] in Treatment: 0 Height (in): 62 Weight (lbs): 105 Body Mass Index (BMI): 19.2 DALANEY, FELTUS L ([id number]) [phone] [id number] Page 4 of 4 Nutrition Risk Screening Items Score Screening NUTRITION RISK SCREEN: I have an illness or condition that made me change the kind and/or amount of food I eat 2 Yes I eat fewer than two meals per day 0 No I eat few fruits and vegetables, or milk products 0 No I have three or more drinks of beer, liquor or wine almost every day 0 No I have tooth or mouth problems that make it hard for me to eat 0 No I don't always have enough money to buy the food I need 0 No I eat alone most of the time 0 No I take three or more different prescribed or over-the-counter drugs a day 1 Yes Without wanting to, I have lost or gained 10 pounds in the last six months 0 No I am not always physically able to shop, cook and/or feed myself 0 No Nutrition Protocols Good Risk Protocol Moderate Risk Protocol 0 Provide education on nutrition High Risk Proctocol Risk Level: Moderate Risk Score: 3 Electronic Signature(s) Signed: 03/22/2023 6:21:23 PM By: Shawn Stall RN, BSN Entered By:  Shawn Stall on 03/21/2023 13:45:53

## 2023-03-28 ENCOUNTER — Encounter (HOSPITAL_BASED_OUTPATIENT_CLINIC_OR_DEPARTMENT_OTHER): Payer: PPO | Admitting: Physician Assistant

## 2023-03-28 DIAGNOSIS — L97822 Non-pressure chronic ulcer of other part of left lower leg with fat layer exposed: Secondary | ICD-10-CM | POA: Diagnosis not present

## 2023-03-29 NOTE — Progress Notes (Addendum)
IVOREE, KRUMWIEDE ([id number]) [id number] Page 1 of 6 Visit Report for 03/28/2023 Chief Complaint Document Details Patient Name: Date of Service: [name], [name] 03/28/2023 12:30 PM Medical Record Number: [id number] Patient Account Number: [id number] Date of Birth/Sex: Treating RN: 07/19/1933 (89 y.o. F) Primary Care Provider: [name] Other Clinician: Referring Provider: Treating Provider/Extender: [name] in Treatment: 1 Information Obtained from: Patient Chief Complaint Left LE Ulcer/Abrasion Electronic Signature(s) Signed: 03/28/2023 12:51:14 PM By: Allen Derry PA-C Entered By: Allen Derry on 03/28/2023 12:51:14 -------------------------------------------------------------------------------- HPI Details Patient Name: Date of Service: [place], [name] 03/28/2023 12:30 PM Medical Record Number: [id number] Patient Account Number: [id number] Date of Birth/Sex: Treating RN: 01-Aug-1933 (89 y.o. F) Primary Care Provider: [name] Other Clinician: Referring Provider: Treating Provider/Extender: [name] in Treatment: 1 History of Present Illness HPI Description: READMISSION 07/15/2019 Patient is now an 89 year old woman who was previously here in 2016 and 2018. Cared for at the time by Dr. Meyer Russel. Both times with wounds related to trauma in the left leg. She was felt to have underlying venous insufficiency although both occasions were related to trauma. The patient tells me that 2 weeks ago she hit her lateral left leg on the car door. This was a skin tear with a flap for a period of time she has been using Polysporin. The flap came off recently she has a clean looking superficial wound on the left lateral leg. Our intake nurse reported some drainage. Past medical history; includes sensorineural hearing loss, osteoarthritis, hypertension and a hammertoe on the right foot for which she  follows with podiatry. ABIs in our clinic were 1.19 on the left 07/21/2019; the patient did not like the wraps. They slid down and rub the wound the wound is measuring larger she is upset. 10/12; left lateral leg wound. Most of this looks healthy even under illumination. We have been using Hydrofera Blue under border foam. She would not allow compression 10/19; left lateral leg wound. 2 small open areas remain of the original wound. We have been using Hydrofera Blue under border foam. This seems to be making decent progress 10/26 left lateral leg traumatic wound. There is no open wound remaining. We have been using Hydrofera Blue under border foam she arrived with some denuded epithelium that I removed there is no open wound remaining. Is fairly clear she has some degree of chronic venous insufficiency with not a lot of edema but dilated veins in her feet. She reminds me that she also has reactive arthritis and was on prednisone for a prolonged period of time. Nevertheless I think it would be beneficial for her to at least wear support stockings but right now I do not think she is going to agree to the Readmission: 02/18/2020 upon evaluation today patient has sustained a skin tear which occurred about 1 week ago she tells me. Fortunately there does not appear to be any signs of active infection at this time which is excellent news. She has been tolerating the dressing changes without complication. Overall very pleased with where things stand at this point. The only issue that I see here is that the skin flap somewhat folded back and then reattached further down causing an area that is actually bunched up to form where it closed on itself but then reattached on the end of the tissue. Nonetheless I think we have to trim off the bunched up tissue in order to allow  this to heal appropriately. That is the only issue I really see today. 03/10/2020 upon evaluation today patient actually appears to be doing  excellent at this time. She has healed quite nicely and has been a couple of weeks since [place] ([id number]) [id number] Page 2 of 6 I saw her. Overall I feel like she is completely healed and ready for discharge as of today. Readmission: 03-21-2023 upon evaluation today patient appears to be doing somewhat poorly in regard to the left wound ulcer she tells me has been present for about a month. She tells me that she had this on the metal flatbed shopping carts at [facility] and subsequently has been having issues here with this since she tells me that the original Band-Aid she was using caused this to get worse. With that being said I do not see any evidence of active infection at this time everything seems to really be doing quite well as far as I am concerned. 03-28-2023 upon evaluation today patient appears to be doing well currently in regard to her wounds which in fact appear to be completely healed. I am very pleased with this. Electronic Signature(s) Signed: 03/28/2023 2:23:15 PM By: Allen Derry PA-C Entered By: Allen Derry on 03/28/2023 14:23:15 -------------------------------------------------------------------------------- Physical Exam Details Patient Name: Date of Service: [name], [name] 03/28/2023 12:30 PM Medical Record Number: [id number] Patient Account Number: [id number] Date of Birth/Sex: Treating RN: 04-06-1933 (89 y.o. F) Primary Care Provider: [name] Other Clinician: Referring Provider: Treating Provider/Extender: [name] in Treatment: 1 Constitutional Well-nourished and well-hydrated in no acute distress. Respiratory normal breathing without difficulty. Psychiatric this patient is able to make decisions and demonstrates good insight into disease process. Alert and Oriented x 3. pleasant and cooperative. Notes Upon inspection patient's wound bed actually showed signs of good granulation  epithelization at this point. Fortunately I think she is actually done very well in the past week this appears to be completely healed and in general I think that we are on the appropriate tract towards complete closure here. Electronic Signature(s) Signed: 03/28/2023 2:23:33 PM By: Allen Derry PA-C Entered By: Allen Derry on 03/28/2023 14:23:33 -------------------------------------------------------------------------------- Physician Orders Details Patient Name: Date of Service: [place], [name] 03/28/2023 12:30 PM Medical Record Number: [id number] Patient Account Number: [id number] Date of Birth/Sex: Treating RN: 08-07-1933 (89 y.o. Arta Silence Primary Care Provider: [name] Other Clinician: Referring Provider: Treating Provider/Extender: [name] in Treatment: 1 Verbal / Phone Orders: No Diagnosis Coding ICD-10 Coding Code Description [phone] Abrasion, left lower leg, initial encounter L97.822 Non-pressure chronic ulcer of other part of left lower leg with fat layer exposed I10 Essential (primary) hypertension MARILISA, BOSQUES L ([id number]) [phone].pdf Page 3 of 6 Discharge From [facility] Services Discharge from Wound Care Center - Call if any future wound care needs. AandD ointment, gauze and fish net during the day x1 week. at night apply AandD for protection. Wound Treatment Electronic Signature(s) Signed: 03/28/2023 4:47:25 PM By: Allen Derry PA-C Signed: 03/29/2023 5:31:32 PM By: Shawn Stall RN, BSN Entered By: Shawn Stall on 03/28/2023 13:01:37 -------------------------------------------------------------------------------- Problem List Details Patient Name: Date of Service: [place], [name] 03/28/2023 12:30 PM Medical Record Number: [id number] Patient Account Number: [id number] Date of Birth/Sex: Treating RN: 1932/10/29 (89 y.o. F) Primary Care Provider: [name] Other Clinician: Referring  Provider: Treating Provider/Extender: [name] in Treatment: 1 Active Problems ICD-10 Encounter Code  Description Active Date MDM Diagnosis S80.812A Abrasion, left lower leg, initial encounter 03/21/2023 No Yes L97.822 Non-pressure chronic ulcer of other part of left lower leg with fat layer exposed6/02/2023 No Yes I10 Essential (primary) hypertension 03/21/2023 No Yes Inactive Problems Resolved Problems Electronic Signature(s) Signed: 03/28/2023 12:51:09 PM By: Allen Derry PA-C Entered By: Allen Derry on 03/28/2023 12:51:08 -------------------------------------------------------------------------------- Progress Note Details Patient Name: Date of Service: [employer], [name] 03/28/2023 12:30 PM Medical Record Number: [id number] Patient Account Number: [id number] Date of Birth/Sex: Treating RN: 03-02-1933 (89 y.o. F) Primary Care Provider: [name] Other Clinician: Referring Provider: Treating Provider/Extender: Madelyn Flavors, [name] in Treatment: 1 [place], [place] L ([id number]) [id number] Page 4 of 6 Subjective Chief Complaint Information obtained from Patient Left LE Ulcer/Abrasion History of Present Illness (HPI) READMISSION 07/15/2019 Patient is now an 89 year old woman who was previously here in 2016 and 2018. Cared for at the time by Dr. Meyer Russel. Both times with wounds related to trauma in the left leg. She was felt to have underlying venous insufficiency although both occasions were related to trauma. The patient tells me that 2 weeks ago she hit her lateral left leg on the car door. This was a skin tear with a flap for a period of time she has been using Polysporin. The flap came off recently she has a clean looking superficial wound on the left lateral leg. Our intake nurse reported some drainage. Past medical history; includes sensorineural hearing loss, osteoarthritis, hypertension and a hammertoe  on the right foot for which she follows with podiatry. ABIs in our clinic were 1.19 on the left 07/21/2019; the patient did not like the wraps. They slid down and rub the wound the wound is measuring larger she is upset. 10/12; left lateral leg wound. Most of this looks healthy even under illumination. We have been using Hydrofera Blue under border foam. She would not allow compression 10/19; left lateral leg wound. 2 small open areas remain of the original wound. We have been using Hydrofera Blue under border foam. This seems to be making decent progress 10/26 left lateral leg traumatic wound. There is no open wound remaining. We have been using Hydrofera Blue under border foam she arrived with some denuded epithelium that I removed there is no open wound remaining. Is fairly clear she has some degree of chronic venous insufficiency with not a lot of edema but dilated veins in her feet. She reminds me that she also has reactive arthritis and was on prednisone for a prolonged period of time. Nevertheless I think it would be beneficial for her to at least wear support stockings but right now I do not think she is going to agree to the Readmission: 02/18/2020 upon evaluation today patient has sustained a skin tear which occurred about 1 week ago she tells me. Fortunately there does not appear to be any signs of active infection at this time which is excellent news. She has been tolerating the dressing changes without complication. Overall very pleased with where things stand at this point. The only issue that I see here is that the skin flap somewhat folded back and then reattached further down causing an area that is actually bunched up to form where it closed on itself but then reattached on the end of the tissue. Nonetheless I think we have to trim off the bunched up tissue in order to allow this to heal appropriately. That is the only issue I really see today. 03/10/2020 upon  evaluation today patient  actually appears to be doing excellent at this time. She has healed quite nicely and has been a couple of weeks since I saw her. Overall I feel like she is completely healed and ready for discharge as of today. Readmission: 03-21-2023 upon evaluation today patient appears to be doing somewhat poorly in regard to the left wound ulcer she tells me has been present for about a month. She tells me that she had this on the metal flatbed shopping carts at [facility] and subsequently has been having issues here with this since she tells me that the original Band-Aid she was using caused this to get worse. With that being said I do not see any evidence of active infection at this time everything seems to really be doing quite well as far as I am concerned. 03-28-2023 upon evaluation today patient appears to be doing well currently in regard to her wounds which in fact appear to be completely healed. I am very pleased with this. Objective Constitutional Well-nourished and well-hydrated in no acute distress. Vitals Time Taken: 12:40 PM, Height: 62 in, Weight: 105 lbs, BMI: 19.2, Temperature: 98.3 F, Pulse: 55 bpm, Respiratory Rate: 18 breaths/min, Blood Pressure: 129/75 mmHg. Respiratory normal breathing without difficulty. Psychiatric this patient is able to make decisions and demonstrates good insight into disease process. Alert and Oriented x 3. pleasant and cooperative. General Notes: Upon inspection patient's wound bed actually showed signs of good granulation epithelization at this point. Fortunately I think she is actually done very well in the past week this appears to be completely healed and in general I think that we are on the appropriate tract towards complete closure here. Integumentary (Hair, Skin) Wound #6 status is Open. Original cause of wound was Trauma. The date acquired was: 02/18/2023. The wound has been in treatment 1 weeks. The wound is located on the Left,Anterior Lower Leg. The wound  measures 0cm length x 0cm width x 0cm depth; 0cm^2 area and 0cm^3 volume. There is no tunneling or undermining noted. There is a none present amount of drainage noted. The wound margin is distinct with the outline attached to the wound base. There is no granulation within the wound bed. There is no necrotic tissue within the wound bed. The periwound skin appearance did not exhibit: Callus, Crepitus, Excoriation, Induration, Rash, Scarring, Dry/Scaly, Maceration, Atrophie Blanche, Cyanosis, Ecchymosis, Hemosiderin Staining, Mottled, Pallor, Rubor, Erythema. HALAYA, DENNE ([id number]) [id number] Page 5 of 6 Assessment Active Problems ICD-10 Abrasion, left lower leg, initial encounter Non-pressure chronic ulcer of other part of left lower leg with fat layer exposed Essential (primary) hypertension Plan Discharge From [facility] Services: Discharge from Wound Care Center - Call if any future wound care needs. AandD ointment, gauze and fish net during the day x1 week. at night apply AandD for protection. 1. I would recommend that we have the patient continue to monitor for any signs of infection or worsening. Overall I do believe that we are on the right track and I believe that the patient is making really good progress. 2. I am going to suggest as well that she should continue with the AandD ointment over the area protecting it during the day and then at night she can take this off just putting AandE over. We will see patient back for reevaluation in 1 week here in the clinic. If anything worsens or changes patient will contact our office for additional recommendations. Electronic Signature(s) Signed: 03/28/2023 2:24:03 PM By: Allen Derry PA-C Entered  By: Allen Derry on 03/28/2023 14:24:03 -------------------------------------------------------------------------------- SuperBill Details Patient Name: Date of Service: [place], [name] 03/28/2023 Medical Record Number:  [id number] Patient Account Number: [id number] Date of Birth/Sex: Treating RN: May 21, 1933 (89 y.o. Arta Silence Primary Care Provider: [name] Other Clinician: Referring Provider: Treating Provider/Extender: [name] in Treatment: 1 Diagnosis Coding ICD-10 Codes Code Description [phone] Abrasion, left lower leg, initial encounter [phone] Non-pressure chronic ulcer of other part of left lower leg with fat layer exposed I10 Essential (primary) hypertension Facility Procedures : CPT4 Code: [id number] Description: 99213 - WOUND CARE VISIT-LEV 3 EST PT Modifier: Quantity: 1 Physician Procedures : CPT4 Code Description Modifier [id number] - WC PHYS LEVEL 3 - EST PT ICD-10 Diagnosis Description S80.812A Abrasion, left lower leg, initial encounter L97.822 Non-pressure chronic ulcer of other part of left lower leg with fat layer exposed I10  Essential (primary) hypertension Quantity: 1 Electronic Signature(s) SOFIAH, BALLMAN ([id number]) [id number] Page 6 of 6 Signed: 03/28/2023 2:24:24 PM By: Allen Derry PA-C Entered By: Allen Derry on 03/28/2023 14:24:24

## 2023-03-31 NOTE — Progress Notes (Signed)
ADAR, FERDON ([id number]) [id number] Page 1 of 6 Visit Report for 03/28/2023 Arrival Information Details Patient Name: Date of Service: [name], [name] 03/28/2023 12:30 PM Medical Record Number: [id number] Patient Account Number: [id number] Date of Birth/Sex: Treating RN: 07-Apr-1933 (89 y.o. [name], [place] Primary Care Staton Markey: [name] Other Clinician: Referring Eyoel Throgmorton: Treating Rajiv Parlato/Extender: [name] in Treatment: 1 Visit Information History Since Last Visit Added or deleted any medications: No Patient Arrived: Ambulatory Any new allergies or adverse reactions: No Arrival Time: 12:42 Had a fall or experienced change in No Accompanied By: self activities of daily living that may affect Transfer Assistance: None risk of falls: Patient Identification Verified: Yes Signs or symptoms of abuse/neglect since last visito No Secondary Verification Process Completed: Yes Hospitalized since last visit: No Patient Requires Transmission-Based Precautions: No Implantable device outside of the clinic excluding No Patient Has Alerts: No cellular tissue based products placed in the center since last visit: Has Dressing in Place as Prescribed: Yes Pain Present Now: No Electronic Signature(s) Signed: 03/29/2023 5:31:32 PM By: Shawn Stall RN, BSN Entered By: Shawn Stall on 03/28/2023 12:42:33 -------------------------------------------------------------------------------- Clinic Level of Care Assessment Details Patient Name: Date of Service: [name], [name] 03/28/2023 12:30 PM Medical Record Number: [id number] Patient Account Number: [id number] Date of Birth/Sex: Treating RN: [name] (89 y.o. [name] Primary Care Davionne Dowty: [name] Other Clinician: Referring Nakyla Bracco: Treating Kairav Russomanno/Extender: [name] in Treatment: 1 Clinic Level of Care Assessment Items TOOL  4 Quantity Score X- 1 0 Use when only an EandM is performed on FOLLOW-UP visit ASSESSMENTS - Nursing Assessment / Reassessment X- 1 10 Reassessment of Co-morbidities (includes updates in patient status) X- 1 5 Reassessment of Adherence to Treatment Plan ASSESSMENTS - Wound and Skin A ssessment / Reassessment X - Simple Wound Assessment / Reassessment - one wound 1 5 []  - 0 Complex Wound Assessment / Reassessment - multiple wounds X- 1 10 Dermatologic / Skin Assessment (not related to wound area) ASSESSMENTS - Focused Assessment X- 1 5 Circumferential Edema Measurements - multi extremities []  - 0 Nutritional Assessment / Counseling / Intervention [name], [name] ([id number]) [id number] Page 2 of 6 []  - 0 Lower Extremity Assessment (monofilament, tuning fork, pulses) []  - 0 Peripheral Arterial Disease Assessment (using hand held doppler) ASSESSMENTS - Ostomy and/or Continence Assessment and Care []  - 0 Incontinence Assessment and Management []  - 0 Ostomy Care Assessment and Management (repouching, etc.) PROCESS - Coordination of Care X - Simple Patient / Family Education for ongoing care 1 15 []  - 0 Complex (extensive) Patient / Family Education for ongoing care X- 1 10 Staff obtains Chiropractor, Records, T Results / Process Orders est []  - 0 Staff telephones HHA, Nursing Homes / Clarify orders / etc []  - 0 Routine Transfer to another Facility (non-emergent condition) []  - 0 Routine Hospital Admission (non-emergent condition) []  - 0 New Admissions / Manufacturing engineer / Ordering NPWT Apligraf, etc. , []  - 0 Emergency Hospital Admission (emergent condition) X- 1 10 Simple Discharge Coordination []  - 0 Complex (extensive) Discharge Coordination PROCESS - Special Needs []  - 0 Pediatric / Minor Patient Management []  - 0 Isolation Patient Management []  - 0 Hearing / Language / Visual special needs []  - 0 Assessment of Community assistance  (transportation, D/C planning, etc.) []  - 0 Additional assistance / Altered mentation []  - 0 Support Surface(s) Assessment (bed, cushion, seat, etc.) INTERVENTIONS - Wound Cleansing /  Measurement X - Simple Wound Cleansing - one wound 1 5 []  - 0 Complex Wound Cleansing - multiple wounds X- 1 5 Wound Imaging (photographs - any number of wounds) []  - 0 Wound Tracing (instead of photographs) X- 1 5 Simple Wound Measurement - one wound []  - 0 Complex Wound Measurement - multiple wounds INTERVENTIONS - Wound Dressings X - Small Wound Dressing one or multiple wounds 1 10 []  - 0 Medium Wound Dressing one or multiple wounds []  - 0 Large Wound Dressing one or multiple wounds []  - 0 Application of Medications - topical []  - 0 Application of Medications - injection INTERVENTIONS - Miscellaneous []  - 0 External ear exam []  - 0 Specimen Collection (cultures, biopsies, blood, body fluids, etc.) []  - 0 Specimen(s) / Culture(s) sent or taken to Lab for analysis []  - 0 Patient Transfer (multiple staff / Nurse, adult / Similar devices) []  - 0 Simple Staple / Suture removal (25 or less) []  - 0 Complex Staple / Suture removal (26 or more) []  - 0 Hypo / Hyperglycemic Management (close monitor of Blood Glucose) [name], [name] ([id number]) [id number] Page 3 of 6 []  - 0 Ankle / Brachial Index (ABI) - do not check if billed separately X- 1 5 Vital Signs Has the patient been seen at the hospital within the last three years: Yes Total Score: 100 Level Of Care: New/Established - Level 3 Electronic Signature(s) Signed: 03/29/2023 5:31:32 PM By: Shawn Stall RN, BSN Entered By: Shawn Stall on 03/28/2023 13:02:16 -------------------------------------------------------------------------------- Encounter Discharge Information Details Patient Name: Date of Service: [name], [name] 03/28/2023 12:30 PM Medical Record Number: [id number] Patient Account Number:  [id number] Date of Birth/Sex: Treating RN: [name] (89 y.o. [name] Primary Care Beautiful Pensyl: [name] Other Clinician: Referring Elonna Mcfarlane: Treating Kelee Cunningham/Extender: [name] in Treatment: 1 Encounter Discharge Information Items Discharge Condition: Stable Ambulatory Status: Ambulatory Discharge Destination: Home Transportation: Private Auto Accompanied By: self Schedule Follow-up Appointment: Yes Clinical Summary of Care: Electronic Signature(s) Signed: 03/29/2023 5:31:32 PM By: Shawn Stall RN, BSN Entered By: Shawn Stall on 03/28/2023 13:02:42 -------------------------------------------------------------------------------- Lower Extremity Assessment Details Patient Name: Date of Service: [place], [name] 03/28/2023 12:30 PM Medical Record Number: [id number] Patient Account Number: [id number] Date of Birth/Sex: Treating RN: 08/05/1933 (89 y.o. [name] Primary Care Anitra Doxtater: [name] Other Clinician: Referring Ranbir Chew: Treating Bonnye Halle/Extender: [name] in Treatment: 1 Edema Assessment Assessed: [Left: Yes] [Right: No] Edema: [Left: N] [Right: o] Calf Left: Right: Point of Measurement: 35 cm From Medial Instep 30 cm Ankle Left: Right: Point of Measurement: 11 cm From Medial Instep 16.5 cm Vascular Assessment [name], [name] ([id number]) [Right:[id number] Page 4 of 6] Pulses: Dorsalis Pedis Palpable: [Left:Yes] Electronic Signature(s) Signed: 03/29/2023 5:31:32 PM By: Shawn Stall RN, BSN Entered By: Shawn Stall on 03/28/2023 12:44:53 -------------------------------------------------------------------------------- Multi-Disciplinary Care Plan Details Patient Name: Date of Service: [place], [name] 03/28/2023 12:30 PM Medical Record Number: [id number] Patient Account Number: [id number] Date of Birth/Sex: Treating RN: 10-30-1932 (89 y.o. [name] Primary Care Baldwin Racicot: [name] Other Clinician: Referring Millicent Blazejewski: Treating Ife Vitelli/Extender: [name] in Treatment: 1 Active Inactive Electronic Signature(s) Signed: 03/29/2023 5:31:32 PM By: Shawn Stall RN, BSN Entered By: Shawn Stall on 03/28/2023 13:01:43 -------------------------------------------------------------------------------- Pain Assessment Details Patient Name: Date of Service: [place], [name] 03/28/2023 12:30 PM Medical Record Number: [id number] Patient Account Number: [id number] Date of Birth/Sex: Treating  RN: 07-Jul-1933 (89 y.o. [name] Primary Care Blake Vetrano: [name] Other Clinician: Referring Hamzah Savoca: Treating Sulamita Lafountain/Extender: [name] in Treatment: 1 Active Problems Location of Pain Severity and Description of Pain Patient Has Paino No Site Locations Pain Management and Medication [name], [name] ([id number]) [id number] Page 5 of 6 Current Pain Management: Electronic Signature(s) Signed: 03/29/2023 5:31:32 PM By: Shawn Stall RN, BSN Entered By: Shawn Stall on 03/28/2023 12:43:39 -------------------------------------------------------------------------------- Wound Assessment Details Patient Name: Date of Service: [place], [name] 03/28/2023 12:30 PM Medical Record Number: [id number] Patient Account Number: [id number] Date of Birth/Sex: Treating RN: [birth year] (89 y.o. [name], Millard.Loa Primary Care Shandrea Lusk: [name] Other Clinician: Referring Nikiya Starn: Treating Eriyanna Kofoed/Extender: [name] in Treatment: 1 Wound Status Wound Number: 6 Primary Etiology: Abrasion Wound Location: Left, Anterior Lower Leg Wound Status: Open Wounding Event: Trauma Comorbid History: Cataracts, Hypertension, Raynauds, Osteoarthritis Date Acquired: 02/18/2023 Weeks Of Treatment: 1 Clustered Wound:  No Photos Wound Measurements Length: (cm) Width: (cm) Depth: (cm) Area: (cm) Volume: (cm) 0 % Reduction in Area: 100% 0 % Reduction in Volume: 100% 0 Epithelialization: Large (67-100%) 0 Tunneling: No 0 Undermining: No Wound Description Classification: Full Thickness Without Exposed Support Structures Wound Margin: Distinct, outline attached Exudate Amount: None Present Foul Odor After Cleansing: No Slough/Fibrino No Wound Bed Granulation Amount: None Present (0%) Exposed Structure Necrotic Amount: None Present (0%) Fascia Exposed: No Fat Layer (Subcutaneous Tissue) Exposed: No Tendon Exposed: No Muscle Exposed: No Joint Exposed: No Bone Exposed: No Periwound Skin Texture Texture Color No Abnormalities Noted: No No Abnormalities Noted: No Callus: No Atrophie Blanche: No Crepitus: No Cyanosis: No Excoriation: No Ecchymosis: No [name], [name] ([id number]) [id number] Page 6 of 6 Induration: No Erythema: No Rash: No Hemosiderin Staining: No Scarring: No Mottled: No Pallor: No Moisture Rubor: No No Abnormalities Noted: No Dry / Scaly: No Maceration: No Electronic Signature(s) Signed: 03/29/2023 5:31:32 PM By: Shawn Stall RN, BSN Entered By: Shawn Stall on 03/28/2023 12:47:34 -------------------------------------------------------------------------------- Vitals Details Patient Name: Date of Service: [place], [name] 03/28/2023 12:30 PM Medical Record Number: [id number] Patient Account Number: [id number] Date of Birth/Sex: Treating RN: 1933/02/21 (89 y.o. [name], [place] Primary Care Cyera Balboni: [name] Other Clinician: Referring Annelie Boak: Treating Chirsty Armistead/Extender: [name] in Treatment: 1 Vital Signs Time Taken: 12:40 Temperature (F): 98.3 Height (in): 62 Pulse (bpm): 55 Weight (lbs): 105 Respiratory Rate (breaths/min): 18 Body Mass Index (BMI): 19.2 Blood Pressure (mmHg):  129/75 Reference Range: 80 - 120 mg / dl Electronic Signature(s) Signed: 03/29/2023 5:31:32 PM By: Shawn Stall RN, BSN Entered By: Shawn Stall on 03/28/2023 12:43:35

## 2023-04-03 ENCOUNTER — Encounter (HOSPITAL_BASED_OUTPATIENT_CLINIC_OR_DEPARTMENT_OTHER): Payer: PPO | Admitting: General Surgery

## 2023-04-04 DIAGNOSIS — M79671 Pain in right foot: Secondary | ICD-10-CM | POA: Diagnosis not present

## 2023-04-09 DIAGNOSIS — M0609 Rheumatoid arthritis without rheumatoid factor, multiple sites: Secondary | ICD-10-CM | POA: Diagnosis not present

## 2023-04-12 DIAGNOSIS — M069 Rheumatoid arthritis, unspecified: Secondary | ICD-10-CM | POA: Diagnosis not present

## 2023-04-12 DIAGNOSIS — G894 Chronic pain syndrome: Secondary | ICD-10-CM | POA: Diagnosis not present

## 2023-04-12 DIAGNOSIS — Z79899 Other long term (current) drug therapy: Secondary | ICD-10-CM | POA: Diagnosis not present

## 2023-04-12 DIAGNOSIS — M154 Erosive (osteo)arthritis: Secondary | ICD-10-CM | POA: Diagnosis not present

## 2023-04-23 ENCOUNTER — Encounter (HOSPITAL_BASED_OUTPATIENT_CLINIC_OR_DEPARTMENT_OTHER): Payer: Self-pay

## 2023-04-23 ENCOUNTER — Emergency Department (HOSPITAL_BASED_OUTPATIENT_CLINIC_OR_DEPARTMENT_OTHER)
Admission: EM | Admit: 2023-04-23 | Discharge: 2023-04-23 | Disposition: A | Discharge status: Home / Self Care | Payer: PPO | Source: Home / Self Care | Attending: Emergency Medicine | Admitting: Emergency Medicine

## 2023-04-23 ENCOUNTER — Other Ambulatory Visit: Payer: Self-pay

## 2023-04-23 ENCOUNTER — Emergency Department (HOSPITAL_BASED_OUTPATIENT_CLINIC_OR_DEPARTMENT_OTHER): Payer: PPO

## 2023-04-23 DIAGNOSIS — Z91040 Latex allergy status: Secondary | ICD-10-CM | POA: Insufficient documentation

## 2023-04-23 DIAGNOSIS — Z87891 Personal history of nicotine dependence: Secondary | ICD-10-CM | POA: Diagnosis not present

## 2023-04-23 DIAGNOSIS — W19XXXA Unspecified fall, initial encounter: Secondary | ICD-10-CM | POA: Diagnosis not present

## 2023-04-23 DIAGNOSIS — S80211A Abrasion, right knee, initial encounter: Secondary | ICD-10-CM | POA: Diagnosis not present

## 2023-04-23 DIAGNOSIS — S0181XA Laceration without foreign body of other part of head, initial encounter: Secondary | ICD-10-CM | POA: Diagnosis not present

## 2023-04-23 DIAGNOSIS — W01198A Fall on same level from slipping, tripping and stumbling with subsequent striking against other object, initial encounter: Secondary | ICD-10-CM | POA: Diagnosis not present

## 2023-04-23 DIAGNOSIS — S0993XA Unspecified injury of face, initial encounter: Secondary | ICD-10-CM | POA: Diagnosis not present

## 2023-04-23 DIAGNOSIS — G319 Degenerative disease of nervous system, unspecified: Secondary | ICD-10-CM | POA: Diagnosis not present

## 2023-04-23 DIAGNOSIS — S01111A Laceration without foreign body of right eyelid and periocular area, initial encounter: Secondary | ICD-10-CM | POA: Diagnosis not present

## 2023-04-23 DIAGNOSIS — S0990XA Unspecified injury of head, initial encounter: Secondary | ICD-10-CM | POA: Diagnosis not present

## 2023-04-23 DIAGNOSIS — S80212A Abrasion, left knee, initial encounter: Secondary | ICD-10-CM | POA: Diagnosis not present

## 2023-04-23 NOTE — ED Triage Notes (Signed)
Patient here POV from Home.  Endorses tripping over Cement Block this AM. No LOC. No Anticoagulants. Went to UC and Surgical Glue was applied to Right Eyebrow Laceration. Sent for Assessment and Possible Imaging. No C-Spine Tenderness. No Back Pain.   NAD Noted during Triage. A&Ox4. Gcs 15. Ambulatory.

## 2023-04-23 NOTE — Discharge Instructions (Signed)
CT head and face without any brain injury skull injury or any facial bony injuries.  Wound care as directed.  Follow-up with your doctor as needed.

## 2023-04-23 NOTE — ED Notes (Signed)
Reviewed AVS/discharge instruction with patient. Time allotted for and all questions answered. Patient is agreeable for d/c and escorted to ed exit by staff.  

## 2023-04-23 NOTE — ED Provider Notes (Addendum)
[place] EMERGENCY DEPARTMENT AT [facility] Provider Note   CSN: [id number] Arrival date & time: 04/23/23  1648     History  Chief Complaint  Patient presents with   [name] is a 89 y.o. female.  Patient sent for physical charge urgent care she had a fall outside striking the right side of her face.  With a laceration on the right eyebrow and abrasion to the lateral aspect of the right eye.  No eye pain.  No visual changes.  At the urgent care they glued her eyebrow laceration and dressed an abrasion to her left knee.  Patient has no complaint of the left knee is walking fine.  No other extremity injuries no neck pain no back pain.  No nausea or vomiting no loss of consciousness patient not on blood thinners.  Patient sent here appropriately for CT head.  Will also do CT maxillofacial due to the nature of the contusions and swelling to the lateral aspect of the right eye.  Past medical history significant for gastroesophageal reflux disease arthritis.  Status post vaginal hysterectomy.  Patient is a former smoker.  Does not say when she quit.  Patient is fairly certain that her tetanus is up-to-date.       Home Medications Prior to Admission medications   Medication Sig Start Date End Date Taking? Authorizing Provider  Acetaminophen (TYLENOL EXTRA STRENGTH PO) Take by mouth.    [provider]  B Complex Vitamins (B-COMPLEX/B-12 PO) Take by mouth daily.    [provider]  betamethasone dipropionate 0.05 % cream SMARTSIG:Sparingly Topical Twice Daily 10/20/21   [provider]  Biotin 10 MG TABS Take by mouth.    [provider]  Calcium Citrate (CITRACAL PO) Take by mouth.    [provider]  clobetasol (TEMOVATE) 0.05 % external solution SMARTSIG:1 Milliliter(s) Topical Every Night 05/09/21   [provider]  conjugated estrogens (PREMARIN) vaginal cream PLACE 0.5 MG VAGINALLY 2 TIMES A WEEK AT BEDTIME  11/06/22   Ardell Isaacs, Forrestine Him, MD  diclofenac sodium (VOLTAREN) 1 % GEL  10/24/16   [provider]  estradiol (VIVELLE-DOT) 0.0375 MG/24HR APPLY 1/2 PATCH TOPICALLY TO THE SKIN 2 TIMES A WEEK 11/06/22   Ardell Isaacs, Forrestine Him, MD  hydroxychloroquine (PLAQUENIL) 200 MG tablet Take 200 mg by mouth daily. 08/25/19   [provider]  inFLIXimab-abda (RENFLEXIS IV) Inject into the vein.    [provider]  ipratropium (ATROVENT) 0.06 % nasal spray SMARTSIG:2 Spray(s) Both Nares 3 Times Daily PRN    [provider]  metoprolol succinate (TOPROL-XL) 25 MG 24 hr tablet Takes 1/2 05/21/19   [provider]  Multiple Vitamin (MULTIVITAMIN ADULT) TABS     [provider]  naloxone (NARCAN) nasal spray 4 mg/0.1 mL SMARTSIG:Both Nares 07/13/22   [provider]  NONFORMULARY OR COMPOUNDED ITEM Testosterone Propionate Petrolatum (jar) 2% ointment Disp 2 Gm. S: APPLY ONE TEAR-DROP SIZE AMOUNT TO INNER THIGH AREA AT BEDTIME 1 TIME A WEEK.  ALTERNATE THIGHS. 11/06/22   Ardell Isaacs, Forrestine Him, MD  OVER THE COUNTER MEDICATION Place 1 drop into both eyes 2 (two) times daily as needed (redness/ dry eyes). Over the counter eye drops    [provider]  oxyCODONE (OXY IR/ROXICODONE) 5 MG immediate release tablet Take 5 mg by mouth at bedtime as needed. 06/15/20   [provider]  pantoprazole (PROTONIX) 20 MG tablet Take  20 mg by mouth daily. 10/24/21   [provider]  RESTASIS 0.05 % ophthalmic emulsion INT 1 GTT INTO OU BID 11/12/18   [provider]  valACYclovir (VALTREX) 1000 MG tablet as needed.  01/06/17   [provider]  zolpidem (AMBIEN) 10 MG tablet Take 5-10 mg by mouth at bedtime as needed. 02/13/22   [provider]      Allergies    Sulfa antibiotics and Latex    Review of Systems   Review of Systems  Constitutional:  Negative for chills and fever.  HENT:  Negative for ear pain and  sore throat.   Eyes:  Negative for pain and visual disturbance.  Respiratory:  Negative for cough and shortness of breath.   Cardiovascular:  Negative for chest pain and palpitations.  Gastrointestinal:  Negative for abdominal pain and vomiting.  Genitourinary:  Negative for dysuria and hematuria.  Musculoskeletal:  Negative for arthralgias and back pain.  Skin:  Positive for wound. Negative for color change and rash.  Neurological:  Negative for seizures and syncope.  All other systems reviewed and are negative.   Physical Exam Updated Vital Signs BP (!) 140/75 (BP Location: Left Arm)   Pulse 73   Temp 97.9 F (36.6 C)   Resp 18   Ht 1.6 m (5\' 3" )   Wt 47.6 kg   SpO2 97%   BMI 18.60 kg/m  Physical Exam Vitals and nursing note reviewed.  Constitutional:      General: She is not in acute distress.    Appearance: Normal appearance. She is well-developed.  HENT:     Head: Normocephalic.     Comments: Patient with about 2 cm laceration to right eyebrow that has been glued.  There is an abrasion to the lateral aspect of the right eye that she put some new skin on.  Extract muscles are intact.  No evidence of hyphema.  Lots of bruising around the lateral aspect of the right orbit. Eyes:     Extraocular Movements: Extraocular movements intact.     Conjunctiva/sclera: Conjunctivae normal.     Pupils: Pupils are equal, round, and reactive to light.  Cardiovascular:     Rate and Rhythm: Normal rate and regular rhythm.     Heart sounds: No murmur heard. Pulmonary:     Effort: Pulmonary effort is normal. No respiratory distress.     Breath sounds: Normal breath sounds.  Abdominal:     Palpations: Abdomen is soft.     Tenderness: There is no abdominal tenderness.  Musculoskeletal:        General: No swelling.     Cervical back: Neck supple. No rigidity or tenderness.     Comments: An abrasion to the left knee.  No patellar dislocation nose effusion.  No swelling to the knee.  Good  movement of both lower extremities good movement of both upper extremities.  Back thoracic and lumbar nontender to palpation.  Skin:    General: Skin is warm and dry.     Capillary Refill: Capillary refill takes less than 2 seconds.  Neurological:     General: No focal deficit present.     Mental Status: She is alert and oriented to person, place, and time.  Psychiatric:        Mood and Affect: Mood normal.     ED Results / Procedures / Treatments   Labs (all labs ordered are listed, but only abnormal results are displayed) Labs Reviewed - No data to  display  EKG None  Radiology No results found.  Procedures Procedures    Medications Ordered in ED Medications - No data to display  ED Course/ Medical Decision Making/ A&P                             Medical Decision Making Amount and/or Complexity of Data Reviewed Radiology: ordered.   Patient sent from urgent care for CT head.  Will also do CT maxillofacial.  Wounds have already been repaired by the urgent care.  Patient states tetanus is up-to-date.   Head CT CT maxillofacial without any acute bony injuries or brain injury.  Patient stable for discharge home   Final Clinical Impression(s) / ED Diagnoses Final diagnoses:  Fall, initial encounter  Injury of head, initial encounter  Eyebrow laceration, right, initial encounter  Abrasion of left knee, initial encounter    Rx / DC Orders ED Discharge Orders     None         Vanetta Mulders, MD 04/23/23 Elesa Massed, MD 04/23/23 [phone]

## 2023-05-14 DIAGNOSIS — L409 Psoriasis, unspecified: Secondary | ICD-10-CM | POA: Diagnosis not present

## 2023-05-14 DIAGNOSIS — M0609 Rheumatoid arthritis without rheumatoid factor, multiple sites: Secondary | ICD-10-CM | POA: Diagnosis not present

## 2023-05-14 DIAGNOSIS — Z1589 Genetic susceptibility to other disease: Secondary | ICD-10-CM | POA: Diagnosis not present

## 2023-05-14 DIAGNOSIS — Z6820 Body mass index (BMI) 20.0-20.9, adult: Secondary | ICD-10-CM | POA: Diagnosis not present

## 2023-05-14 DIAGNOSIS — M154 Erosive (osteo)arthritis: Secondary | ICD-10-CM | POA: Diagnosis not present

## 2023-05-14 DIAGNOSIS — Z79899 Other long term (current) drug therapy: Secondary | ICD-10-CM | POA: Diagnosis not present

## 2023-05-14 DIAGNOSIS — M459 Ankylosing spondylitis of unspecified sites in spine: Secondary | ICD-10-CM | POA: Diagnosis not present

## 2023-05-18 ENCOUNTER — Other Ambulatory Visit: Payer: Self-pay

## 2023-05-18 ENCOUNTER — Emergency Department (HOSPITAL_BASED_OUTPATIENT_CLINIC_OR_DEPARTMENT_OTHER)
Admission: EM | Admit: 2023-05-18 | Discharge: 2023-05-18 | Discharge status: Against Medical Advice | Payer: PPO | Source: Home / Self Care

## 2023-05-18 ENCOUNTER — Encounter (HOSPITAL_BASED_OUTPATIENT_CLINIC_OR_DEPARTMENT_OTHER): Payer: Self-pay

## 2023-05-18 DIAGNOSIS — Z5321 Procedure and treatment not carried out due to patient leaving prior to being seen by health care provider: Secondary | ICD-10-CM | POA: Diagnosis not present

## 2023-05-18 DIAGNOSIS — L989 Disorder of the skin and subcutaneous tissue, unspecified: Secondary | ICD-10-CM | POA: Diagnosis not present

## 2023-05-18 DIAGNOSIS — Z5189 Encounter for other specified aftercare: Secondary | ICD-10-CM | POA: Diagnosis not present

## 2023-05-18 NOTE — ED Notes (Signed)
Pt came to nursing station and stated that she needed to leave d/t appt at 1400. Pt stated she told staff when checking in. Pt handed this RN her pt labels and b/p cuff and left.

## 2023-05-18 NOTE — ED Provider Notes (Signed)
Reported to patient's room for initial evaluation; however patient had left prior to being seen. Informed by RN that patient had an appointment at 1:30PM.    Mannie Stabile, PA-C 05/18/23 1331    Vanetta Mulders, MD 05/18/23 1504

## 2023-05-18 NOTE — ED Triage Notes (Signed)
Pt presents for a wound check of a lesion on her L ankle. Wound has been present for over 3 months and pt is being treated at the wound clinic. Pt is concerned for infection, but was unable to be seen in the clinic today.

## 2023-05-21 ENCOUNTER — Encounter (HOSPITAL_COMMUNITY): Payer: Self-pay

## 2023-05-21 ENCOUNTER — Ambulatory Visit (HOSPITAL_COMMUNITY)
Admission: RE | Admit: 2023-05-21 | Discharge: 2023-05-21 | Disposition: A | Discharge status: Home / Self Care | Payer: PPO | Source: Ambulatory Visit | Attending: Internal Medicine | Admitting: Internal Medicine

## 2023-05-21 VITALS — BP 127/74 | HR 79 | Temp 97.9°F | Resp 16 | Ht 62.0 in | Wt 105.0 lb

## 2023-05-21 DIAGNOSIS — L97822 Non-pressure chronic ulcer of other part of left lower leg with fat layer exposed: Secondary | ICD-10-CM | POA: Diagnosis not present

## 2023-05-21 DIAGNOSIS — L089 Local infection of the skin and subcutaneous tissue, unspecified: Secondary | ICD-10-CM

## 2023-05-21 MED ORDER — DOXYCYCLINE HYCLATE 100 MG PO CAPS: 100.0000 mg | ORAL_CAPSULE | Freq: Two times a day (BID) | ORAL | 0 refills | Status: AC

## 2023-05-21 NOTE — Discharge Instructions (Signed)
Take doxycycline two times daily with food. May try to use over the counter collagen daily on wound. Continue to wash the area with soap and water. Increase protein intake to help with wound healing. Follow up as scheduled with wound care center. Return to urgent care if area worsens prior to that appointment.

## 2023-05-21 NOTE — ED Provider Notes (Signed)
[facility]    CSN: [id number] Arrival date & time: 05/21/23  1230      History   Chief Complaint Chief Complaint  Patient presents with   Leg Swelling    Wound on lower shin. Possible infection has caused swelling on ankle and foot - Entered by patient    HPI [name] is a 89 y.o. female.   89 yr old female who presents for evaluation of worsening left lower leg ulceration now with pain and increasing redness with some foot swelling. The patient reports having this wound for over 3 months. She was seeing the wound care center in [place], but it wasn't improving and she hasn't seen them in about a month. She has rescheduled a follow up for August 21st but due to the pain, redness and swelling she felt it might be infected. She denies fevers, chills or other symptoms. She has tried numerous dressings including polymem, A&D ointment, xeroform, Vaseline and silcone bordered foams. Nothing seems to work and she reports that many things have irritated her skin and she feels it made the wound worse. She keeps it covered and washes it daily.      Past Medical History:  Diagnosis Date   Arthritis    "osteoarthritis"   Diarrhea 08/28/11   "for the last 17 days; from samonella poisoning"   GERD (gastroesophageal reflux disease)    Headache(784.0) 08/28/11   "just for the last few days; releated to dehydration"   Hearing aid worn    Shortness of breath 08/28/11   "hard time breathing deeply because of the pain"    Patient Active Problem List   Diagnosis Date Noted   Presbycusis of both ears 12/02/2018   S/P reverse total shoulder arthroplasty, left 04/09/2018   Chronic left shoulder pain 03/20/2018   Sensorineural hearing loss (SNHL), bilateral 01/03/2017   Bilateral impacted cerumen 07/03/2016   Rhinitis, chronic 07/03/2016   H/O thrombocytosis 01/29/2012   Neck pain, acute 08/31/2011   Arthritis of hand 08/31/2011   Supraventricular tachycardia 08/31/2011    Salmonella enteritis 08/28/2011   Muscle spasm of back 08/28/2011   Dehydration 08/28/2011   GERD (gastroesophageal reflux disease) 08/28/2011   DJD (degenerative joint disease) 08/28/2011   Rotator cuff tear, left 08/28/2011   ARF (acute renal failure) (HCC) 08/28/2011    Past Surgical History:  Procedure Laterality Date   BACK SURGERY  10/2004   "spinal stenosis"   CATARACT EXTRACTION W/ INTRAOCULAR LENS  IMPLANT, BILATERAL  Summer 2011   FACIAL COSMETIC SURGERY  ~ 2002   FINGER SURGERY  ~ 2000   "titanium rod in right pointer; from arthritis"   FOOT SURGERY Right    KNEE ARTHROSCOPY Right    4/23   SHOULDER SURGERY Left 03/2018   TONSILLECTOMY  1943   TUBAL LIGATION  1970's   VAGINAL HYSTERECTOMY  11/2002   "still have my ovaries"    OB History     Gravida  2   Para  2   Term  2   Preterm      AB      Living  1      SAB      IAB      Ectopic      Multiple      Live Births  2            Home Medications    Prior to Admission medications   Medication Sig Start Date End Date Taking?  Authorizing Provider  Acetaminophen (TYLENOL EXTRA STRENGTH PO) Take by mouth.   Yes [provider]  doxycycline (VIBRAMYCIN) 100 MG capsule Take 1 capsule (100 mg total) by mouth 2 (two) times daily for 5 days. 05/21/23 05/26/23 Yes Myesha Stillion A, PA-C  B Complex Vitamins (B-COMPLEX/B-12 PO) Take by mouth daily.    [provider]  betamethasone dipropionate 0.05 % cream SMARTSIG:Sparingly Topical Twice Daily 10/20/21   [provider]  Biotin 10 MG TABS Take by mouth.    [provider]  Calcium Citrate (CITRACAL PO) Take by mouth.    [provider]  clobetasol (TEMOVATE) 0.05 % external solution SMARTSIG:1 Milliliter(s) Topical Every Night 05/09/21   [provider]  conjugated estrogens (PREMARIN) vaginal cream PLACE 0.5 MG VAGINALLY 2 TIMES A WEEK AT BEDTIME 11/06/22   Ardell Isaacs, Forrestine Him, MD  diclofenac  sodium (VOLTAREN) 1 % GEL  10/24/16   [provider]  estradiol (VIVELLE-DOT) 0.0375 MG/24HR APPLY 1/2 PATCH TOPICALLY TO THE SKIN 2 TIMES A WEEK 11/06/22   Ardell Isaacs, Forrestine Him, MD  hydroxychloroquine (PLAQUENIL) 200 MG tablet Take 200 mg by mouth daily. 08/25/19   [provider]  inFLIXimab-abda (RENFLEXIS IV) Inject into the vein.    [provider]  ipratropium (ATROVENT) 0.06 % nasal spray SMARTSIG:2 Spray(s) Both Nares 3 Times Daily PRN    [provider]  metoprolol succinate (TOPROL-XL) 25 MG 24 hr tablet Takes 1/2 05/21/19   [provider]  Multiple Vitamin (MULTIVITAMIN ADULT) TABS     [provider]  naloxone (NARCAN) nasal spray 4 mg/0.1 mL SMARTSIG:Both Nares 07/13/22   [provider]  NONFORMULARY OR COMPOUNDED ITEM Testosterone Propionate Petrolatum (jar) 2% ointment Disp 2 Gm. S: APPLY ONE TEAR-DROP SIZE AMOUNT TO INNER THIGH AREA AT BEDTIME 1 TIME A WEEK.  ALTERNATE THIGHS. 11/06/22   Ardell Isaacs, Forrestine Him, MD  OVER THE COUNTER MEDICATION Place 1 drop into both eyes 2 (two) times daily as needed (redness/ dry eyes). Over the counter eye drops    [provider]  oxyCODONE (OXY IR/ROXICODONE) 5 MG immediate release tablet Take 5 mg by mouth at bedtime as needed. 06/15/20   [provider]  pantoprazole (PROTONIX) 20 MG tablet Take 20 mg by mouth daily. 10/24/21   [provider]  RESTASIS 0.05 % ophthalmic emulsion INT 1 GTT INTO OU BID 11/12/18   [provider]  valACYclovir (VALTREX) 1000 MG tablet as needed.  01/06/17   [provider]  zolpidem (AMBIEN) 10 MG tablet Take 5-10 mg by mouth at bedtime as needed. 02/13/22   [provider]    Family History Family History  Problem Relation Age of Onset   Cancer Father        colon    Social History Social History   Tobacco Use   Smoking status: Former    Current packs/day: 1.50    Average packs/day: 1.5  packs/day for 30.0 years (45.0 ttl pk-yrs)    Types: Cigarettes   Smokeless tobacco: Never   Tobacco comments:    quit 30 years go   Vaping Use   Vaping status: Never Used  Substance Use Topics   Alcohol use: Yes    Alcohol/week: 7.0 standard drinks of alcohol    Types: 7 Standard drinks or equivalent per week    Comment: Occ   Drug use: No     Allergies   Sulfa antibiotics and Latex  Review of Systems Review of Systems  Constitutional:  Negative for appetite change, fatigue and fever.  Respiratory:  Negative for shortness of breath.   Cardiovascular:  Positive for leg swelling (Left foot secondary to wound).  Gastrointestinal:  Negative for nausea and vomiting.  Musculoskeletal:  Negative for gait problem and joint swelling.  Skin:  Positive for wound (Left pre-tibial).  Neurological:  Negative for light-headedness.     Physical Exam Triage Vital Signs ED Triage Vitals  Encounter Vitals Group     BP 05/21/23 1302 127/74     Systolic BP Percentile --      Diastolic BP Percentile --      Pulse Rate 05/21/23 1302 79     Resp 05/21/23 1302 16     Temp 05/21/23 1302 97.9 F (36.6 C)     Temp Source 05/21/23 1302 Oral     SpO2 05/21/23 1302 95 %     Weight 05/21/23 1301 105 lb (47.6 kg)     Height 05/21/23 1301 5\' 2"  (1.575 m)     Head Circumference --      Peak Flow --      Pain Score 05/21/23 1301 3     Pain Loc --      Pain Education --      Exclude from Growth Chart --    No data found.  Updated Vital Signs BP 127/74 (BP Location: Left Arm)   Pulse 79   Temp 97.9 F (36.6 C) (Oral)   Resp 16   Ht 5\' 2"  (1.575 m)   Wt 105 lb (47.6 kg)   SpO2 95%   BMI 19.20 kg/m   Visual Acuity Right Eye Distance:   Left Eye Distance:   Bilateral Distance:    Right Eye Near:   Left Eye Near:    Bilateral Near:     Physical Exam Constitutional:      Appearance: Normal appearance.  HENT:     Mouth/Throat:     Mouth: Mucous membranes are moist.   Cardiovascular:     Rate and Rhythm: Normal rate and regular rhythm.  Pulmonary:     Effort: Pulmonary effort is normal.  Abdominal:     Palpations: Abdomen is soft.  Skin:    General: Skin is warm and dry.     Capillary Refill: Capillary refill takes less than 2 seconds.       Neurological:     General: No focal deficit present.     Mental Status: She is alert.  Psychiatric:        Mood and Affect: Mood normal.        Behavior: Behavior normal.        Thought Content: Thought content normal.        Judgment: Judgment normal.      UC Treatments / Results  Labs (all labs ordered are listed, but only abnormal results are displayed) Labs Reviewed - No data to display  EKG   Radiology No results found.  Procedures Procedures (including critical care time)  Medications Ordered in UC Medications - No data to display  Initial Impression / Assessment and Plan / UC Course  I have reviewed the triage vital signs and the nursing notes.  Pertinent labs & imaging results that were available during my care of the patient were reviewed by me and considered in my medical decision making (see chart for details).     J88.416 Left lower extremity ulceration: patient does appear to have localized  erythema and pain c/w a wound infection. Culture not likely beneficial. Will empirically treat with Doxycycline for 5 days. Advised patient to increase her protein intake to help with healing. May try to use a collagen dressing to help with healing. May use gauze and coban as previous as this is what the patient says works best for her skin. Follow up with wound care as scheduled. Return to urgent care if area worsens or fails to improve.  Final Clinical Impressions(s) / UC Diagnoses   Final diagnoses:  Ulcer of left pretibial region with fat layer exposed [facility])     Discharge Instructions      Take doxycycline two times daily with food. May try to use over the counter collagen daily  on wound. Continue to wash the area with soap and water. Increase protein intake to help with wound healing. Follow up as scheduled with wound care center. Return to urgent care if area worsens prior to that appointment.      ED Prescriptions     Medication Sig Dispense Auth. Provider   doxycycline (VIBRAMYCIN) 100 MG capsule Take 1 capsule (100 mg total) by mouth 2 (two) times daily for 5 days. 10 capsule Landis Martins, [place]      PDMP not reviewed this encounter.   Landis Martins, [place] 05/21/23 1353

## 2023-05-21 NOTE — ED Triage Notes (Signed)
Patient here today with c/o wound on anterior left shin X 3 months. She has been being treated by the wound center about 6 week ago. Next available appointment is not until 06/06/23. Patient noticed significant swelling in her left foot and concerned with possible infection. Initial wound from hitting leg on a cart at [facility].

## 2023-05-23 ENCOUNTER — Encounter (HOSPITAL_BASED_OUTPATIENT_CLINIC_OR_DEPARTMENT_OTHER): Payer: PPO | Attending: Physician Assistant | Admitting: Physician Assistant

## 2023-05-23 DIAGNOSIS — I10 Essential (primary) hypertension: Secondary | ICD-10-CM | POA: Diagnosis not present

## 2023-05-23 DIAGNOSIS — Z87891 Personal history of nicotine dependence: Secondary | ICD-10-CM | POA: Diagnosis not present

## 2023-05-23 DIAGNOSIS — I87332 Chronic venous hypertension (idiopathic) with ulcer and inflammation of left lower extremity: Secondary | ICD-10-CM | POA: Insufficient documentation

## 2023-05-23 DIAGNOSIS — H905 Unspecified sensorineural hearing loss: Secondary | ICD-10-CM | POA: Diagnosis not present

## 2023-05-23 DIAGNOSIS — S81802A Unspecified open wound, left lower leg, initial encounter: Secondary | ICD-10-CM | POA: Diagnosis not present

## 2023-05-23 DIAGNOSIS — L97822 Non-pressure chronic ulcer of other part of left lower leg with fat layer exposed: Secondary | ICD-10-CM | POA: Insufficient documentation

## 2023-05-24 NOTE — Progress Notes (Signed)
[name], SHAYNE ([id number]) [id number] Page 1 of 4 Visit Report for 05/23/2023 Abuse Risk Screen Details Patient Name: Date of Service: [place], [name] 05/23/2023 12:30 PM Medical Record Number: [id number] Patient Account Number: [id number] Date of Birth/Sex: Treating RN: December 12, 1932 (89 y.o. [name] Primary Care Thaer Miyoshi: [name] Other Clinician: Referring Hamad Whyte: Treating Helana Macbride/Extender: [name] in Treatment: 0 Abuse Risk Screen Items Answer ABUSE RISK SCREEN: Has anyone close to you tried to hurt or harm you recentlyo No Do you feel uncomfortable with anyone in your familyo No Has anyone forced you do things that you didnt want to doo No Electronic Signature(s) Signed: 05/24/2023 12:03:42 PM By: Zenaida Deed RN, BSN Entered By: Zenaida Deed on 05/23/2023 12:58:10 -------------------------------------------------------------------------------- Activities of Daily Living Details Patient Name: Date of Service: [place], [name] 05/23/2023 12:30 PM Medical Record Number: [id number] Patient Account Number: [id number] Date of Birth/Sex: Treating RN: 27-Jan-1933 (89 y.o. [name] Primary Care Roberta Kelly: [name] Other Clinician: Referring Miette Molenda: Treating Briley Sulton/Extender: [name] in Treatment: 0 Activities of Daily Living Items Answer Activities of Daily Living (Please select one for each item) Drive Automobile Completely Able T Medications ake Completely Able Use T elephone Completely Able Care for Appearance Completely Able Use T oilet Completely Able Bath / Shower Completely Able Dress Self Completely Able Feed Self Completely Able Walk Completely Able Get In / Out Bed Completely Able Housework Completely Able Prepare Meals Completely Able Handle Money Completely Able Shop for Self Completely Able Electronic Signature(s) Signed:  05/24/2023 12:03:42 PM By: Zenaida Deed RN, BSN Entered By: Zenaida Deed on 05/23/2023 12:58:35 Jenna Luo ([id number]) [phone] [id number] Page 2 of 4 -------------------------------------------------------------------------------- Education Screening Details Patient Name: Date of Service: [place], [name] 05/23/2023 12:30 PM Medical Record Number: [id number] Patient Account Number: [id number] Date of Birth/Sex: Treating RN: [birth year] (89 y.o. [name] Primary Care Daejon Lich: [name] Other Clinician: Referring Retina Bernardy: Treating Corvette Orser/Extender: [name] in Treatment: 0 Primary Learner Assessed: Patient Learning Preferences/Education Level/Primary Language Learning Preference: Explanation, Demonstration, Printed Material Highest Education Level: College or Above Preferred Language: English Cognitive Barrier Language Barrier: No Translator Needed: No Memory Deficit: No Emotional Barrier: No Cultural/Religious Beliefs Affecting Medical Care: No Physical Barrier Impaired Vision: No Impaired Hearing: No Decreased Hand dexterity: No Knowledge/Comprehension Knowledge Level: High Comprehension Level: High Ability to understand written instructions: High Ability to understand verbal instructions: High Motivation Anxiety Level: Calm Cooperation: Cooperative Education Importance: Acknowledges Need Interest in Health Problems: Asks Questions Perception: Coherent Willingness to Engage in Self-Management High Activities: Readiness to Engage in Self-Management High Activities: Electronic Signature(s) Signed: 05/24/2023 12:03:42 PM By: Zenaida Deed RN, BSN Entered By: Zenaida Deed on 05/23/2023 12:59:22 -------------------------------------------------------------------------------- Fall Risk Assessment Details Patient Name: Date of Service: [place], [name] 05/23/2023 12:30 PM Medical Record  Number: [id number] Patient Account Number: [id number] Date of Birth/Sex: Treating RN: 01-01-1933 (89 y.o. [name] Primary Care Sander Speckman: [name] Other Clinician: Referring Caylin Raby: Treating Shayana Hornstein/Extender: [name] in Treatment: 0 Fall Risk Assessment Items Have you had 2 or more falls in the last 12 monthso 0 No TAWNIE, BATTERSON L ([id number]) [phone] [id number] Page 3 of 4 Have you had any fall that resulted in injury in the last 12 monthso 0 Yes FALLS RISK SCREEN History of falling - immediate or within 3 months 25 Yes Secondary diagnosis (  Do you have 2 or more medical diagnoseso) 0 No Ambulatory aid None/bed rest/wheelchair/nurse 0 Yes Crutches/cane/walker 0 No Furniture 0 No Intravenous therapy Access/Saline/Heparin Lock 0 No Gait/Transferring Normal/ bed rest/ wheelchair 0 Yes Weak (short steps with or without shuffle, stooped but able to lift head while walking, may seek 0 No support from furniture) Impaired (short steps with shuffle, may have difficulty arising from chair, head down, impaired 0 No balance) Mental Status Oriented to own ability 0 Yes Electronic Signature(s) Signed: 05/24/2023 12:03:42 PM By: Zenaida Deed RN, BSN Entered By: Zenaida Deed on 05/23/2023 12:59:49 -------------------------------------------------------------------------------- Foot Assessment Details Patient Name: Date of Service: [place], [name] 05/23/2023 12:30 PM Medical Record Number: [id number] Patient Account Number: [id number] Date of Birth/Sex: Treating RN: [birth year] (89 y.o. [name] Primary Care Allan Minotti: [name] Other Clinician: Referring Desten Manor: Treating Matvey Llanas/Extender: [name] in Treatment: 0 Foot Assessment Items Site Locations + = Sensation present, - = Sensation absent, C = Callus, U = Ulcer R = Redness, W = Warmth, M = Maceration, PU =  Pre-ulcerative lesion F = Fissure, S = Swelling, D = Dryness Assessment Right: Left: Other Deformity: No No Prior Foot Ulcer: No No Prior Amputation: No No Charcot Joint: No No Ambulatory Status: Ambulatory Without Help GaitFAIREN, DELISIO ([id number]) [phone] [id number] Page 4 of 4 Electronic Signature(s) Signed: 05/24/2023 12:03:42 PM By: Zenaida Deed RN, BSN Entered By: Zenaida Deed on 05/23/2023 13:01:12 -------------------------------------------------------------------------------- Nutrition Risk Screening Details Patient Name: Date of Service: [place], [name] 05/23/2023 12:30 PM Medical Record Number: [id number] Patient Account Number: [id number] Date of Birth/Sex: Treating RN: May 02, 1933 (89 y.o. [name] Primary Care Charlea Nardo: [name] Other Clinician: Referring Dayna Alia: Treating Demontrez Rindfleisch/Extender: [name] in Treatment: 0 Height (in): Weight (lbs): Body Mass Index (BMI): Nutrition Risk Screening Items Score Screening NUTRITION RISK SCREEN: I have an illness or condition that made me change the kind and/or amount of food I eat 0 No I eat fewer than two meals per day 0 No I eat few fruits and vegetables, or milk products 0 No I have three or more drinks of beer, liquor or wine almost every day 0 No I have tooth or mouth problems that make it hard for me to eat 0 No I don't always have enough money to buy the food I need 0 No I eat alone most of the time 1 Yes I take three or more different prescribed or over-the-counter drugs a day 1 Yes Without wanting to, I have lost or gained 10 pounds in the last six months 0 No I am not always physically able to shop, cook and/or feed myself 0 No Nutrition Protocols Good Risk Protocol 0 No interventions needed Moderate Risk Protocol High Risk Proctocol Risk Level: Good Risk Score: 2 Electronic Signature(s) Signed: 05/24/2023 12:03:42 PM By:  Zenaida Deed RN, BSN Entered By: Zenaida Deed on 05/23/2023 13:01:02

## 2023-05-24 NOTE — Progress Notes (Signed)
REBBA, BRISTOW ([id number]) [id number] Page 1 of 10 Visit Report for 05/23/2023 Chief Complaint Document Details Patient Name: Date of Service: [name], [name] 05/23/2023 12:30 PM Medical Record Number: [id number] Patient Account Number: [id number] Date of Birth/Sex: Treating RN: 1933-09-05 (89 y.o. F) Primary Care Provider: [name] Other Clinician: Referring Provider: Treating Provider/Extender: [name] in Treatment: 0 Information Obtained from: Patient Chief Complaint Left LE Ulcer Electronic Signature(s) Signed: 05/23/2023 1:32:48 PM By: Allen Derry PA-C Entered By: Allen Derry on 05/23/2023 13:32:48 -------------------------------------------------------------------------------- HPI Details Patient Name: Date of Service: [name], [name] 05/23/2023 12:30 PM Medical Record Number: [id number] Patient Account Number: [id number] Date of Birth/Sex: Treating RN: February 01, 1933 (89 y.o. F) Primary Care Provider: [name] Other Clinician: Referring Provider: Treating Provider/Extender: [name] in Treatment: 0 History of Present Illness HPI Description: READMISSION 07/15/2019 Patient is now an 89 year old woman who was previously here in 2016 and 2018. Cared for at the time by Dr. Meyer Russel. Both times with wounds related to trauma in the left leg. She was felt to have underlying venous insufficiency although both occasions were related to trauma. The patient tells me that 2 weeks ago she hit her lateral left leg on the car door. This was a skin tear with a flap for a period of time she has been using Polysporin. The flap came off recently she has a clean looking superficial wound on the left lateral leg. Our intake nurse reported some drainage. Past medical history; includes sensorineural hearing loss, osteoarthritis, hypertension and a hammertoe on the right foot for which she follows with  podiatry. ABIs in our clinic were 1.19 on the left 07/21/2019; the patient did not like the wraps. They slid down and rub the wound the wound is measuring larger she is upset. 10/12; left lateral leg wound. Most of this looks healthy even under illumination. We have been using Hydrofera Blue under border foam. She would not allow compression 10/19; left lateral leg wound. 2 small open areas remain of the original wound. We have been using Hydrofera Blue under border foam. This seems to be making decent progress 10/26 left lateral leg traumatic wound. There is no open wound remaining. We have been using Hydrofera Blue under border foam she arrived with some denuded epithelium that I removed there is no open wound remaining. Is fairly clear she has some degree of chronic venous insufficiency with not a lot of edema but dilated veins in her feet. She reminds me that she also has reactive arthritis and was on prednisone for a prolonged period of time. Nevertheless I think it would be beneficial for her to at least wear support stockings but right now I do not think she is going to agree to the Readmission: 02/18/2020 upon evaluation today patient has sustained a skin tear which occurred about 1 week ago she tells me. Fortunately there does not appear to be any signs of active infection at this time which is excellent news. She has been tolerating the dressing changes without complication. Overall very pleased with where things stand at this point. The only issue that I see here is that the skin flap somewhat folded back and then reattached further down causing an area that is actually bunched up to form where it closed on itself but then reattached on the end of the tissue. Nonetheless I think we have to trim off the bunched up tissue in order to allow  this to heal appropriately. That is the only issue I really see today. 03/10/2020 upon evaluation today patient actually appears to be doing excellent at  this time. She has healed quite nicely and has been a couple of weeks since Reserve ([id number]) [phone].pdf Page 2 of 10 I saw her. Overall I feel like she is completely healed and ready for discharge as of today. Readmission: 03-21-2023 upon evaluation today patient appears to be doing somewhat poorly in regard to the left wound ulcer she tells me has been present for about a month. She tells me that she had this on the metal flatbed shopping carts at [facility] and subsequently has been having issues here with this since she tells me that the original Band-Aid she was using caused this to get worse. With that being said I do not see any evidence of active infection at this time everything seems to really be doing quite well as far as I am concerned. 03-28-2023 upon evaluation today patient appears to be doing well currently in regard to her wounds which in fact appear to be completely healed. I am very pleased with this. Readmission: 05-23-2023 upon evaluation patient presents for reevaluation here in the clinic concerning issues that she has been having with her left anterior lower extremity. This actually appears to have a significant amount of new skin growth over top of the areas of irritation I am not exactly sure what happened here to be perfectly honest. I explained to the patient that this is kind of an unusual presentation for this to be this irritated and yet not have any significant openings at this time. I do not see any evidence of active infection locally or systemically which is great news. Electronic Signature(s) Signed: 05/23/2023 7:21:06 PM By: Allen Derry PA-C Entered By: Allen Derry on 05/23/2023 19:21:06 -------------------------------------------------------------------------------- Physical Exam Details Patient Name: Date of Service: [name], [name] 05/23/2023 12:30 PM Medical Record Number: [id number] Patient Account Number: [id number] Date of  Birth/Sex: Treating RN: 1932-11-05 (89 y.o. F) Primary Care Provider: [name] Other Clinician: Referring Provider: Treating Provider/Extender: [name] in Treatment: 0 Constitutional patient is hypertensive.. pulse regular and within target range for patient.Marland Kitchen respirations regular, non-labored and within target range for patient.Marland Kitchen temperature within target range for patient.. Well-nourished and well-hydrated in no acute distress. Eyes conjunctiva clear no eyelid edema noted. pupils equal round and reactive to light and accommodation. Ears, Nose, Mouth, and Throat no gross abnormality of ear auricles or external auditory canals. normal hearing noted during conversation. mucus membranes moist. Respiratory normal breathing without difficulty. Cardiovascular 2+ dorsalis pedis/posterior tibialis pulses. no clubbing, cyanosis, significant edema, <3 sec cap refill. Musculoskeletal normal gait and posture. no significant deformity or arthritic changes, no loss or range of motion, no clubbing. Psychiatric this patient is able to make decisions and demonstrates good insight into disease process. Alert and Oriented x 3. pleasant and cooperative. Notes Upon inspection patient's wound bed actually showed signs of good granulation and epithelization at this point. Fortunately I do not see any other evidence of infection at this time and in general I think that we are making excellent headway towards complete closure. I am going to monitor over the next week to ensure that this continues to improve. Again I explained to the patient that though it does not appear to be open I do want a try to keep this dry and hopefully allow for this to completely closed without further  incidence or complication. Electronic Signature(s) Signed: 05/23/2023 7:22:36 PM By: Allen Derry PA-C Entered By: Allen Derry on 05/23/2023 19:22:36 Jenna Luo ([id number])  [id number] Page 3 of 10 -------------------------------------------------------------------------------- Physician Orders Details Patient Name: Date of Service: Christalyn, Stantz [name] 05/23/2023 12:30 PM Medical Record Number: [id number] Patient Account Number: [id number] Date of Birth/Sex: Treating RN: [birth year] (89 y.o. [name] Primary Care Provider: [name] Other Clinician: Referring Provider: Treating Provider/Extender: [name] in Treatment: 0 Verbal / Phone Orders: No Diagnosis Coding ICD-10 Coding Code Description [phone] Chronic venous hypertension (idiopathic) with ulcer and inflammation of left lower extremity L97.822 Non-pressure chronic ulcer of other part of left lower leg with fat layer exposed L97.822 Non-pressure chronic ulcer of other part of left lower leg with fat layer exposed I10 Essential (primary) hypertension Follow-up Appointments ppointment in 1 week. - with Leonard Schwartz Return A Anesthetic Wound #7 Left,Anterior Lower Leg (In clinic) Topical Lidocaine 4% applied to wound bed Bathing/ Shower/ Hygiene May shower and wash wound with soap and water. Edema Control - Lymphedema / SCD / Other Elevate legs to the level of the heart or above for 30 minutes daily and/or when sitting for 3-4 times a day throughout the day. Avoid standing for long periods of time. Exercise regularly Wound Treatment Wound #7 - Lower Leg Wound Laterality: Left, Anterior Prim Dressing: Maxorb Extra Ag+ Alginate Dressing, 4x4.75 (in/in) Every Other Day/30 Days ary Discharge Instructions: Apply to wound bed as instructed Secondary Dressing: Woven Gauze Sponge, Non-Sterile 4x4 in Every Other Day/30 Days Discharge Instructions: Apply over primary dressing as directed. Secured With: Coban Self-Adherent Wrap 4x5 (in/yd) Every Other Day/30 Days Discharge Instructions: Secure with Coban as directed. Secured With: Teacher, early years/pre, Sterile 2x75 (in/in) Every Other Day/30 Days Discharge Instructions: Secure with stretch gauze as directed. Electronic Signature(s) Signed: 05/23/2023 7:55:21 PM By: Allen Derry PA-C Signed: 05/24/2023 12:03:42 PM By: Zenaida Deed RN, BSN Entered By: Zenaida Deed on 05/23/2023 13:39:38 -------------------------------------------------------------------------------- Problem List Details Patient Name: Date of Service: [name], [name] 05/23/2023 12:30 PM Medical Record Number: [id number] Patient Account Number: [id number] Date of Birth/Sex: Treating RN: 04/19/1933 (89 y.o. Suhayla Gassaway, [place] [name] ([id number]) [id number] Page 4 of 10 Primary Care Provider: [name] Other Clinician: Referring Provider: Treating Provider/Extender: [name] in Treatment: 0 Active Problems ICD-10 Encounter Code Description Active Date MDM Diagnosis I87.332 Chronic venous hypertension (idiopathic) with ulcer and inflammation of left 05/23/2023 No Yes lower extremity L97.822 Non-pressure chronic ulcer of other part of left lower leg with fat layer exposed8/04/2023 No Yes L97.822 Non-pressure chronic ulcer of other part of left lower leg with fat layer exposed8/04/2023 No Yes I10 Essential (primary) hypertension 05/23/2023 No Yes Inactive Problems Resolved Problems Electronic Signature(s) Signed: 05/23/2023 1:32:32 PM By: Allen Derry PA-C Entered By: Allen Derry on 05/23/2023 13:32:32 -------------------------------------------------------------------------------- Progress Note Details Patient Name: Date of Service: [place], [name] 05/23/2023 12:30 PM Medical Record Number: [id number] Patient Account Number: [id number] Date of Birth/Sex: Treating RN: 1933-08-29 (89 y.o. F) Primary Care Provider: [name] Other Clinician: Referring Provider: Treating Provider/Extender: [name] in Treatment:  0 Subjective Chief Complaint Information obtained from Patient Left LE Ulcer History of Present Illness (HPI) READMISSION 07/15/2019 Patient is now an 89 year old woman who was previously here in 2016 and 2018. Cared for at the time by Dr. Meyer Russel. Both times with wounds related to trauma in the  left leg. She was felt to have underlying venous insufficiency although both occasions were related to trauma. The patient tells me that 2 weeks ago she hit her lateral left leg on the car door. This was a skin tear with a flap for a period of time she has been using Polysporin. The flap came off recently she has a clean looking superficial wound on the left lateral leg. Our intake nurse reported some drainage. Past medical history; includes sensorineural hearing loss, osteoarthritis, hypertension and a hammertoe on the right foot for which she follows with podiatry. ABIs in our clinic were 1.19 on the left 07/21/2019; the patient did not like the wraps. They slid down and rub the wound the wound is measuring larger she is upset. 10/12; left lateral leg wound. Most of this looks healthy even under illumination. We have been using Hydrofera Blue under border foam. She would not allow compression 10/19; left lateral leg wound. 2 small open areas remain of the original wound. We have been using Hydrofera Blue under border foam. This seems to be making decent progress 10/26 left lateral leg traumatic wound. There is no open wound remaining. We have been using Hydrofera Blue under border foam she arrived with some PRANISHA, CHANNEL ([id number]) [id number] Page 5 of 10 denuded epithelium that I removed there is no open wound remaining. Is fairly clear she has some degree of chronic venous insufficiency with not a lot of edema but dilated veins in her feet. She reminds me that she also has reactive arthritis and was on prednisone for a prolonged period of time. Nevertheless I think it  would be beneficial for her to at least wear support stockings but right now I do not think she is going to agree to the Readmission: 02/18/2020 upon evaluation today patient has sustained a skin tear which occurred about 1 week ago she tells me. Fortunately there does not appear to be any signs of active infection at this time which is excellent news. She has been tolerating the dressing changes without complication. Overall very pleased with where things stand at this point. The only issue that I see here is that the skin flap somewhat folded back and then reattached further down causing an area that is actually bunched up to form where it closed on itself but then reattached on the end of the tissue. Nonetheless I think we have to trim off the bunched up tissue in order to allow this to heal appropriately. That is the only issue I really see today. 03/10/2020 upon evaluation today patient actually appears to be doing excellent at this time. She has healed quite nicely and has been a couple of weeks since I saw her. Overall I feel like she is completely healed and ready for discharge as of today. Readmission: 03-21-2023 upon evaluation today patient appears to be doing somewhat poorly in regard to the left wound ulcer she tells me has been present for about a month. She tells me that she had this on the metal flatbed shopping carts at [facility] and subsequently has been having issues here with this since she tells me that the original Band-Aid she was using caused this to get worse. With that being said I do not see any evidence of active infection at this time everything seems to really be doing quite well as far as I am concerned. 03-28-2023 upon evaluation today patient appears to be doing well currently in regard to her wounds which in fact appear to  be completely healed. I am very pleased with this. Readmission: 05-23-2023 upon evaluation patient presents for reevaluation here in the clinic concerning  issues that she has been having with her left anterior lower extremity. This actually appears to have a significant amount of new skin growth over top of the areas of irritation I am not exactly sure what happened here to be perfectly honest. I explained to the patient that this is kind of an unusual presentation for this to be this irritated and yet not have any significant openings at this time. I do not see any evidence of active infection locally or systemically which is great news. Patient History Information obtained from Patient, Chart. Allergies Sulfa (Sulfonamide Antibiotics), latex (Reaction: rash, blister) Family History Cancer - Father, No family history of Diabetes, Heart Disease, Hereditary Spherocytosis, Hypertension, Kidney Disease, Lung Disease, Seizures, Stroke, Thyroid Problems, Tuberculosis. Social History Former smoker - quit 25 yr ago, Marital Status - Married, Alcohol Use - Daily - vodka, wine, Drug Use - No History, Caffeine Use - Daily - tea. Medical History Eyes Patient has history of Cataracts - bil removed Denies history of Glaucoma, Optic Neuritis Ear/Nose/Mouth/Throat Denies history of Chronic sinus problems/congestion, Middle ear problems Hematologic/Lymphatic Denies history of Anemia, Hemophilia, Human Immunodeficiency Virus, Lymphedema, Sickle Cell Disease Respiratory Denies history of Aspiration, Asthma, Chronic Obstructive Pulmonary Disease (COPD), Pneumothorax, Sleep Apnea, Tuberculosis Cardiovascular Patient has history of Hypertension Denies history of Angina, Arrhythmia, Congestive Heart Failure, Coronary Artery Disease, Deep Vein Thrombosis, Hypotension, Myocardial Infarction, Peripheral Arterial Disease, Peripheral Venous Disease, Phlebitis, Vasculitis Gastrointestinal Denies history of Cirrhosis , Colitis, Crohns, Hepatitis A, Hepatitis B, Hepatitis C Endocrine Denies history of Type I Diabetes, Type II Diabetes Genitourinary Denies history  of End Stage Renal Disease Immunological Patient has history of Raynauds Denies history of Lupus Erythematosus, Scleroderma Integumentary (Skin) Denies history of History of Burn Musculoskeletal Patient has history of Rheumatoid Arthritis, Osteoarthritis Denies history of Gout, Osteomyelitis Neurologic Denies history of Dementia, Neuropathy, Quadriplegia, Paraplegia, Seizure Disorder Oncologic Denies history of Received Chemotherapy, Received Radiation Psychiatric Denies history of Anorexia/bulimia, Confinement Anxiety Hospitalization/Surgery History - lumbar laminectomy. - facial cosmetic surgery. - titanium rod placed right index finger. - tonsillectomy. - tubal ligation. - vaginal hysterectomy. - bil cataract. - appendectomy. - left shoulder replacement. - right knee surgery 12/2021. Medical A Surgical History Notes nd Cardiovascular Hx SVT Gastrointestinal GERD Immunological LEARLEAN, NAQUIN ([id number]) [id number] Page 6 of 10 reactive arthritis Musculoskeletal reactive arthritis Review of Systems (ROS) Constitutional Symptoms (General Health) Denies complaints or symptoms of Fatigue, Fever, Chills, Marked Weight Change. Eyes Complains or has symptoms of Glasses / Contacts - reading. Denies complaints or symptoms of Dry Eyes, Vision Changes. Ear/Nose/Mouth/Throat Denies complaints or symptoms of Chronic sinus problems or rhinitis. Respiratory Denies complaints or symptoms of Chronic or frequent coughs, Shortness of Breath. Cardiovascular Denies complaints or symptoms of Chest pain. Gastrointestinal Denies complaints or symptoms of Frequent diarrhea, Nausea, Vomiting. Endocrine Denies complaints or symptoms of Heat/cold intolerance. Genitourinary Denies complaints or symptoms of Frequent urination. Integumentary (Skin) Complains or has symptoms of Wounds - left lower leg. Musculoskeletal Denies complaints or symptoms of Muscle Pain, Muscle  Weakness. Neurologic Denies complaints or symptoms of Numbness/parasthesias. Psychiatric Denies complaints or symptoms of Claustrophobia. Objective Constitutional patient is hypertensive.. pulse regular and within target range for patient.Marland Kitchen respirations regular, non-labored and within target range for patient.Marland Kitchen temperature within target range for patient.. Well-nourished and well-hydrated in no acute distress. Vitals Time Taken: 12:54 PM, Temperature: 98 F, Pulse:  72 bpm, Respiratory Rate: 18 breaths/min, Blood Pressure: 159/72 mmHg. Eyes conjunctiva clear no eyelid edema noted. pupils equal round and reactive to light and accommodation. Ears, Nose, Mouth, and Throat no gross abnormality of ear auricles or external auditory canals. normal hearing noted during conversation. mucus membranes moist. Respiratory normal breathing without difficulty. Cardiovascular 2+ dorsalis pedis/posterior tibialis pulses. no clubbing, cyanosis, significant edema, Musculoskeletal normal gait and posture. no significant deformity or arthritic changes, no loss or range of motion, no clubbing. Psychiatric this patient is able to make decisions and demonstrates good insight into disease process. Alert and Oriented x 3. pleasant and cooperative. General Notes: Upon inspection patient's wound bed actually showed signs of good granulation and epithelization at this point. Fortunately I do not see any other evidence of infection at this time and in general I think that we are making excellent headway towards complete closure. I am going to monitor over the next week to ensure that this continues to improve. Again I explained to the patient that though it does not appear to be open I do want a try to keep this dry and hopefully allow for this to completely closed without further incidence or complication. Integumentary (Hair, Skin) Wound #7 status is Open. Original cause of wound was Trauma. The date acquired was:  02/23/2023. The wound is located on the Left,Anterior Lower Leg. The wound measures 4cm length x 4.5cm width x 0.1cm depth; 14.137cm^2 area and 1.414cm^3 volume. There is Fat Layer (Subcutaneous Tissue) exposed. There is no tunneling or undermining noted. There is a medium amount of serous drainage noted. The wound margin is flat and intact. There is large (67-100%) red, pink granulation within the wound bed. There is no necrotic tissue within the wound bed. The periwound skin appearance had no abnormalities noted for texture. The periwound skin appearance exhibited: Maceration, Hemosiderin Staining. Periwound temperature was noted as No Abnormality. The periwound has tenderness on palpation. Assessment Active Problems ICD-10 Chronic venous hypertension (idiopathic) with ulcer and inflammation of left lower extremity Non-pressure chronic ulcer of other part of left lower leg with fat layer exposed Non-pressure chronic ulcer of other part of left lower leg with fat layer exposed SHAMYA, VILLARRUEL ([id number]) [id number] Page 7 of 10 Essential (primary) hypertension Plan Follow-up Appointments: Return Appointment in 1 week. - with Leonard Schwartz Anesthetic: Wound #7 Left,Anterior Lower Leg: (In clinic) Topical Lidocaine 4% applied to wound bed Bathing/ Shower/ Hygiene: May shower and wash wound with soap and water. Edema Control - Lymphedema / SCD / Other: Elevate legs to the level of the heart or above for 30 minutes daily and/or when sitting for 3-4 times a day throughout the day. Avoid standing for long periods of time. Exercise regularly WOUND #7: - Lower Leg Wound Laterality: Left, Anterior Prim Dressing: Maxorb Extra Ag+ Alginate Dressing, 4x4.75 (in/in) Every Other Day/30 Days ary Discharge Instructions: Apply to wound bed as instructed Secondary Dressing: Woven Gauze Sponge, Non-Sterile 4x4 in Every Other Day/30 Days Discharge Instructions: Apply over primary  dressing as directed. Secured With: Coban Self-Adherent Wrap 4x5 (in/yd) Every Other Day/30 Days Discharge Instructions: Secure with Coban as directed. Secured With: Insurance underwriter, Sterile 2x75 (in/in) Every Other Day/30 Days Discharge Instructions: Secure with stretch gauze as directed. 1. Based on what I am seeing I am going to recommend at this time that we have the patient continue to monitor for any signs of infection or worsening. I do believe that she really is doing well we can use  an alginate dressing to try to help calm down the irritation here and hopefully prevent any further breakdown due to [facility] or otherwise. 2. I am also can recommend that we monitor her to get this completely closed. Again it was close will be discharged her previous with no skin opening but at the same time I am not sure exactly what happened that caused this to irritate and reopen we will get a monitor this closely going forward. We will see patient back for reevaluation in 1 week here in the clinic. If anything worsens or changes patient will contact our office for additional recommendations. Electronic Signature(s) Signed: 05/23/2023 7:23:00 PM By: Allen Derry PA-C Entered By: Allen Derry on 05/23/2023 19:23:00 -------------------------------------------------------------------------------- HxROS Details Patient Name: Date of Service: [name], [name] 05/23/2023 12:30 PM Medical Record Number: [id number] Patient Account Number: [id number] Date of Birth/Sex: Treating RN: [birth year] (89 y.o. [name] Primary Care Provider: [name] Other Clinician: Referring Provider: Treating Provider/Extender: [name] in Treatment: 0 Information Obtained From Patient Chart Constitutional Symptoms (General Health) Complaints and Symptoms: Negative for: Fatigue; Fever; Chills; Marked Weight Change Eyes Complaints and Symptoms: Positive for:  Glasses / Contacts - reading Negative for: Dry Eyes; Vision Changes Medical History: Positive for: Cataracts - bil removed Negative for: Glaucoma; Optic Neuritis AJANEE, OSWALT ([id number]) [id number] Page 8 of 10 Ear/Nose/Mouth/Throat Complaints and Symptoms: Negative for: Chronic sinus problems or rhinitis Medical History: Negative for: Chronic sinus problems/congestion; Middle ear problems Respiratory Complaints and Symptoms: Negative for: Chronic or frequent coughs; Shortness of Breath Medical History: Negative for: Aspiration; Asthma; Chronic Obstructive Pulmonary Disease (COPD); Pneumothorax; Sleep Apnea; Tuberculosis Cardiovascular Complaints and Symptoms: Negative for: Chest pain Medical History: Positive for: Hypertension Negative for: Angina; Arrhythmia; Congestive Heart Failure; Coronary Artery Disease; Deep Vein Thrombosis; Hypotension; Myocardial Infarction; Peripheral Arterial Disease; Peripheral Venous Disease; Phlebitis; Vasculitis Past Medical History Notes: Hx SVT Gastrointestinal Complaints and Symptoms: Negative for: Frequent diarrhea; Nausea; Vomiting Medical History: Negative for: Cirrhosis ; Colitis; Crohns; Hepatitis A; Hepatitis B; Hepatitis C Past Medical History Notes: GERD Endocrine Complaints and Symptoms: Negative for: Heat/cold intolerance Medical History: Negative for: Type I Diabetes; Type II Diabetes Genitourinary Complaints and Symptoms: Negative for: Frequent urination Medical History: Negative for: End Stage Renal Disease Integumentary (Skin) Complaints and Symptoms: Positive for: Wounds - left lower leg Medical History: Negative for: History of Burn Musculoskeletal Complaints and Symptoms: Negative for: Muscle Pain; Muscle Weakness Medical History: Positive for: Rheumatoid Arthritis; Osteoarthritis Negative for: Gout; Osteomyelitis Past Medical History Notes: reactive  arthritis Neurologic Complaints and Symptoms: Negative for: Numbness/parasthesias Medical History: Negative for: Dementia; Neuropathy; Quadriplegia; Paraplegia; Seizure Disorder Psychiatric Complaints and Symptoms: CYPRESS, LEIS ([id number]) [id number] Page 9 of 10 Negative for: Claustrophobia Medical History: Negative for: Anorexia/bulimia; Confinement Anxiety Hematologic/Lymphatic Medical History: Negative for: Anemia; Hemophilia; Human Immunodeficiency Virus; Lymphedema; Sickle Cell Disease Immunological Medical History: Positive for: Raynauds Negative for: Lupus Erythematosus; Scleroderma Past Medical History Notes: reactive arthritis Oncologic Medical History: Negative for: Received Chemotherapy; Received Radiation HBO Extended History Items Eyes: Cataracts Immunizations Pneumococcal Vaccine: Received Pneumococcal Vaccination: Yes Received Pneumococcal Vaccination On or After 60th Birthday: Yes Implantable Devices Yes Hospitalization / Surgery History Type of Hospitalization/Surgery lumbar laminectomy facial cosmetic surgery titanium rod placed right index finger tonsillectomy tubal ligation vaginal hysterectomy bil cataract appendectomy left shoulder replacement right knee surgery 12/2021 Family and Social History Cancer: Yes - Father; Diabetes: No; Heart Disease: No; Hereditary Spherocytosis: No; Hypertension: No; Kidney Disease: No;  Lung Disease: No; Seizures: No; Stroke: No; Thyroid Problems: No; Tuberculosis: No; Former smoker - quit 25 yr ago; Marital Status - Married; Alcohol Use: Daily - vodka, wine; Drug Use: No History; Caffeine Use: Daily - tea; Financial Concerns: No; Food, Clothing or Shelter Needs: No; Support System Lacking: No; Transportation Concerns: No Electronic Signature(s) Signed: 05/23/2023 7:55:21 PM By: Allen Derry PA-C Signed: 05/24/2023 12:03:42 PM By: Zenaida Deed RN, BSN Entered By: Zenaida Deed on  05/23/2023 13:13:18 -------------------------------------------------------------------------------- SuperBill Details Patient Name: Date of Service: [name], [name] 05/23/2023 Medical Record Number: [id number] Patient Account Number: [id number] Date of Birth/Sex: Treating RN: April 06, 1933 (89 y.o. [name] Primary Care Provider: [name] Other Clinician: Referring Provider: Treating Provider/Extender: Madelyn Flavors, [name] in Treatment: 0 [name], [name] ([id number]) [id number] Page 10 of 10 Diagnosis Coding ICD-10 Codes Code Description I87.332 Chronic venous hypertension (idiopathic) with ulcer and inflammation of left lower extremity L97.822 Non-pressure chronic ulcer of other part of left lower leg with fat layer exposed L97.822 Non-pressure chronic ulcer of other part of left lower leg with fat layer exposed I10 Essential (primary) hypertension Facility Procedures : CPT4 Code: 38756433 Description: 99214 - WOUND CARE VISIT-LEV 4 EST PT Modifier: Quantity: 1 Physician Procedures : CPT4 Code Description Modifier [id number] - WC PHYS LEVEL 3 - EST PT ICD-10 Diagnosis Description I87.332 Chronic venous hypertension (idiopathic) with ulcer and inflammation of left lower extremity L97.822 Non-pressure chronic ulcer of other part  of left lower leg with fat layer exposed I10 Essential (primary) hypertension Quantity: 1 Electronic Signature(s) Signed: 05/23/2023 7:23:22 PM By: Allen Derry PA-C Entered By: Allen Derry on 05/23/2023 19:23:21

## 2023-05-25 NOTE — Progress Notes (Signed)
[name], [name] ([id number]) [id number] Page 1 of 8 Visit Report for 05/23/2023 Allergy List Details Patient Name: Date of Service: [name], [name] 05/23/2023 12:30 PM Medical Record Number: [id number] Patient Account Number: [id number] Date of Birth/Sex: Treating RN: [name] (89 y.o. [name] Primary Care [name]: [name] Other Clinician: Referring [name]: Treating [name]/Extender: [name] in Treatment: 0 Allergies Active Allergies Sulfa (Sulfonamide Antibiotics) latex Reaction: rash, blister Allergy Notes Electronic Signature(s) Signed: 05/24/2023 12:03:42 PM By: [name] Deed RN, BSN Entered By: [name] on 05/23/2023 12:55:19 -------------------------------------------------------------------------------- Arrival Information Details Patient Name: Date of Service: [name], [name] 05/23/2023 12:30 PM Medical Record Number: [id number] Patient Account Number: [id number] Date of Birth/Sex: Treating RN: [name] (89 y.o. [name] Primary Care [name]: [name] Other Clinician: Referring [name]: Treating [name]/Extender: [name] in Treatment: 0 Visit Information Patient Arrived: Ambulatory Arrival Time: 12:53 Accompanied By: self Transfer Assistance: None Patient Identification Verified: Yes Patient Requires Transmission-Based Precautions: No Patient Has Alerts: No History Since Last Visit Has Dressing in Place as Prescribed: Yes Electronic Signature(s) Signed: 05/24/2023 12:03:42 PM By: [name] Deed RN, BSN Entered By: [name] on 05/23/2023 12:54:03 Clinic Level of Care Assessment Details -------------------------------------------------------------------------------- [name] ([id number]) [id number] Page 2 of 8 Patient Name: Date of Service: [name], [name] 05/23/2023 12:30 PM Medical Record  Number: [id number] Patient Account Number: [id number] Date of Birth/Sex: Treating RN: [name] (89 y.o. [name] Primary Care [name]: [name] Other Clinician: Referring [name]: Treating [name]/Extender: [name] in Treatment: 0 Clinic Level of Care Assessment Items TOOL 2 Quantity Score []  - 0 Use when only an EandM is performed on the INITIAL visit ASSESSMENTS - Nursing Assessment / Reassessment X- 1 20 General Physical Exam (combine w/ comprehensive assessment (listed just below) when performed on new pt. evals) X- 1 25 Comprehensive Assessment (HX, ROS, Risk Assessments, Wounds Hx, etc.) ASSESSMENTS - Wound and Skin A ssessment / Reassessment X - Simple Wound Assessment / Reassessment - one wound 1 5 []  - 0 Complex Wound Assessment / Reassessment - multiple wounds []  - 0 Dermatologic / Skin Assessment (not related to wound area) ASSESSMENTS - Ostomy and/or Continence Assessment and Care []  - 0 Incontinence Assessment and Management []  - 0 Ostomy Care Assessment and Management (repouching, etc.) PROCESS - Coordination of Care X - Simple Patient / Family Education for ongoing care 1 15 []  - 0 Complex (extensive) Patient / Family Education for ongoing care X- 1 10 Staff obtains Chiropractor, Records, T Results / Process Orders est []  - 0 Staff telephones HHA, Nursing Homes / Clarify orders / etc []  - 0 Routine Transfer to another Facility (non-emergent condition) []  - 0 Routine Hospital Admission (non-emergent condition) X- 1 15 New Admissions / Manufacturing engineer / Ordering NPWT Apligraf, etc. , []  - 0 Emergency Hospital Admission (emergent condition) X- 1 10 Simple Discharge Coordination []  - 0 Complex (extensive) Discharge Coordination PROCESS - Special Needs []  - 0 Pediatric / Minor Patient Management []  - 0 Isolation Patient Management []  - 0 Hearing / Language / Visual special needs []  -  0 Assessment of Community assistance (transportation, D/C planning, etc.) []  - 0 Additional assistance / Altered mentation []  - 0 Support Surface(s) Assessment (bed, cushion, seat, etc.) INTERVENTIONS - Wound Cleansing / Measurement X- 1 5 Wound Imaging (photographs - any number of wounds) []  - 0 Wound Tracing (  instead of photographs) X- 1 5 Simple Wound Measurement - one wound []  - 0 Complex Wound Measurement - multiple wounds X- 1 5 Simple Wound Cleansing - one wound []  - 0 Complex Wound Cleansing - multiple wounds INTERVENTIONS - Wound Dressings X - Small Wound Dressing one or multiple wounds 1 10 []  - 0 Medium Wound Dressing one or multiple wounds []  - 0 Large Wound Dressing one or multiple wounds [name], [name] ([id number]) [id number] Page 3 of 8 []  - 0 Application of Medications - injection INTERVENTIONS - Miscellaneous []  - 0 External ear exam []  - 0 Specimen Collection (cultures, biopsies, blood, body fluids, etc.) []  - 0 Specimen(s) / Culture(s) sent or taken to Lab for analysis []  - 0 Patient Transfer (multiple staff / Michiel Sites Lift / Similar devices) []  - 0 Simple Staple / Suture removal (25 or less) []  - 0 Complex Staple / Suture removal (26 or more) []  - 0 Hypo / Hyperglycemic Management (close monitor of Blood Glucose) X- 1 15 Ankle / Brachial Index (ABI) - do not check if billed separately Has the patient been seen at the hospital within the last three years: Yes Total Score: 140 Level Of Care: New/Established - Level 4 Electronic Signature(s) Signed: 05/24/2023 12:03:42 PM By: [name] Deed RN, BSN Entered By: [name] on 05/23/2023 13:50:00 -------------------------------------------------------------------------------- Encounter Discharge Information Details Patient Name: Date of Service: [name], [name] 05/23/2023 12:30 PM Medical Record Number: [id number] Patient Account Number: [id number] Date of Birth/Sex:  Treating RN: [name] (89 y.o. [name] Primary Care [name]: [name] Other Clinician: Referring [name]: Treating [name]/Extender: [name] in Treatment: 0 Encounter Discharge Information Items Discharge Condition: Stable Ambulatory Status: Ambulatory Discharge Destination: Home Transportation: Private Auto Accompanied By: self Schedule Follow-up Appointment: Yes Clinical Summary of Care: Patient Declined Electronic Signature(s) Signed: 05/24/2023 12:03:42 PM By: [name] Deed RN, BSN Entered By: [name] on 05/23/2023 13:51:51 -------------------------------------------------------------------------------- Lower Extremity Assessment Details Patient Name: Date of Service: [place], [name] 05/23/2023 12:30 PM Medical Record Number: [id number] Patient Account Number: [id number] Date of Birth/Sex: Treating RN: [birth year][name] (89 y.o. [name] Primary Care Naveen Lorusso: [name] Other Clinician: Referring Kadia Abaya: Treating Melony Tenpas/Extender: Madelyn Flavors, Bryan Lemma Weeks in Treatment: 0 Edema Assessment S[LeftBIANCO, DICENZO [name] ([id number])] Franne Forts: [id number] Page 4 of 8] Assessed: [Left: No] [Right: No] [Left: Edema] [Right: :] Calf Left: Right: Point of Measurement: From Medial Instep 31 cm Ankle Left: Right: Point of Measurement: From Medial Instep 18.2 cm Vascular Assessment Pulses: Dorsalis Pedis Palpable: [Left:Yes] Extremity colors, hair growth, and conditions: Extremity Color: [Left:Hyperpigmented] Hair Growth on Extremity: [Left:Yes] Temperature of Extremity: [Left:Warm] Capillary Refill: [Left:< 3 seconds] Dependent Rubor: [Left:Yes] Blanched when Elevated: [Left:No] Lipodermatosclerosis: [Left:No] Blood Pressure: Brachial: [Left:159] Ankle: [Left:Dorsalis Pedis: 150 0.94] Electronic Signature(s) Signed: 05/24/2023 12:03:42 PM By: [name] Deed RN,  BSN Entered By: [name] on 05/23/2023 13:11:45 -------------------------------------------------------------------------------- Multi-Disciplinary Care Plan Details Patient Name: Date of Service: [place], [name] 05/23/2023 12:30 PM Medical Record Number: [id number] Patient Account Number: [id number] Date of Birth/Sex: Treating RN: [birth year] (89 y.o. [name] Primary Care Nancylee Gaines: [name] Other Clinician: Referring Candelaria Pies: Treating Lunna Vogelgesang/Extender: [name] in Treatment: 0 Multidisciplinary Care Plan reviewed with physician Active Inactive Abuse / Safety / Falls / Self Care Management Nursing Diagnoses: Potential for falls Goals: Patient/caregiver will verbalize/demonstrate measures taken to prevent injury and/or falls Date Initiated: 05/23/2023 Target Resolution Date:  06/20/2023 Goal Status: Active Interventions: Assess fall risk on admission and as needed Notes: Venous Leg Ulcer Nursing Diagnoses: Knowledge deficit related to disease process and management [name], [name] ([id number]) [phone].pdf Page 5 of 8 Potential for venous Insuffiency (use before diagnosis confirmed) Goals: Patient will maintain optimal edema control Date Initiated: 05/23/2023 Target Resolution Date: 06/20/2023 Goal Status: Active Interventions: Assess peripheral edema status every visit. Compression as ordered Treatment Activities: Therapeutic compression applied : 05/23/2023 Notes: Wound/Skin Impairment Nursing Diagnoses: Impaired tissue integrity Knowledge deficit related to ulceration/compromised skin integrity Goals: Patient/caregiver will verbalize understanding of skin care regimen Date Initiated: 05/23/2023 Target Resolution Date: 06/20/2023 Goal Status: Active Ulcer/skin breakdown will have a volume reduction of 30% by week 4 Date Initiated: 05/23/2023 Target Resolution Date: 06/20/2023 Goal Status:  Active Interventions: Assess patient/caregiver ability to obtain necessary supplies Assess patient/caregiver ability to perform ulcer/skin care regimen upon admission and as needed Assess ulceration(s) every visit Provide education on ulcer and skin care Treatment Activities: Skin care regimen initiated : 05/23/2023 Topical wound management initiated : 05/23/2023 Notes: Electronic Signature(s) Signed: 05/24/2023 12:03:42 PM By: [name] Deed RN, BSN Entered By: [name] on 05/23/2023 13:28:44 -------------------------------------------------------------------------------- Pain Assessment Details Patient Name: Date of Service: [place], [name] 05/23/2023 12:30 PM Medical Record Number: [id number] Patient Account Number: [id number] Date of Birth/Sex: Treating RN: [birth year] [name] (89 y.o. [name] Primary Care Uri Covey: [name] Other Clinician: Referring Aristides Luckey: Treating Alyze Lauf/Extender: [name] in Treatment: 0 Active Problems Location of Pain Severity and Description of Pain Patient Has Paino Yes Site Locations Pain Location: [name], [name] ([id number]) [phone].pdf Page 6 of 8 Pain Location: Pain in Ulcers With Dressing Change: No Duration of the Pain. Constant / Intermittento Intermittent Rate the pain. Current Pain Level: 1 Worst Pain Level: 3 Least Pain Level: 0 Character of Pain Describe the Pain: Aching Pain Management and Medication Current Pain Management: Medication: Yes Is the Current Pain Management Adequate: Adequate How does your wound impact your activities of daily livingo Sleep: No Bathing: No Appetite: No Relationship With Others: No Bladder Continence: No Emotions: No Bowel Continence: No Work: No Toileting: No Drive: No Dressing: No Hobbies: No Electronic Signature(s) Signed: 05/24/2023 12:03:42 PM By: [name] Deed RN, BSN Entered By: [name] on 05/23/2023  13:12:31 -------------------------------------------------------------------------------- Patient/Caregiver Education Details Patient Name: Date of Service: [name] 8/7/2024andnbsp12:30 PM Medical Record Number: [id number] Patient Account Number: [id number] Date of Birth/Gender: Treating RN: 15-[name]-[name] (89 y.o. [name] Primary Care Physician: [name] Other Clinician: Referring Physician: Treating Physician/Extender: [name] in Treatment: 0 Education Assessment Education Provided To: Patient Education Topics Provided Venous: Methods: Explain/Verbal Responses: Reinforcements needed, State content correctly Wound/Skin Impairment: Methods: Explain/Verbal Responses: Reinforcements needed, State content correctly Electronic Signature(s) Signed: 05/24/2023 12:03:42 PM By: [name] Deed RN, BSN [name], [name] ([id number]) [phone].pdf Page 7 of 8 Entered By: [name] on 05/23/2023 13:29:02 -------------------------------------------------------------------------------- Wound Assessment Details Patient Name: Date of Service: [name], [name] [name] 05/23/2023 12:30 PM Medical Record Number: [id number] Patient Account Number: [id number] Date of Birth/Sex: Treating RN: [name] (89 y.o. [name] Primary Care Jakirah Zaun: [name] Other Clinician: Referring Keith Cancio: Treating Olanna Percifield/Extender: [name] in Treatment: 0 Wound Status Wound Number: 7 Primary Etiology: Venous Leg Ulcer Wound Location: Left, Anterior Lower Leg Wound Status: Open Wounding Event: Trauma Comorbid History: Cataracts, Hypertension, Raynauds, Osteoarthritis Date Acquired: 02/23/2023 Weeks Of Treatment: 0 Clustered  Wound: No Photos Wound Measurements Length: (cm) 4 Width: (cm) 4.5 Depth: (cm) 0.1 Area: (cm) 14.137 Volume: (cm) 1.414 % Reduction in Area: % Reduction in  Volume: Epithelialization: Medium (34-66%) Tunneling: No Undermining: No Wound Description Classification: Full Thickness Without Exposed Suppor Wound Margin: Flat and Intact Exudate Amount: Medium Exudate Type: Serous Exudate Color: amber t Structures Foul Odor After Cleansing: No Slough/Fibrino No Wound Bed Granulation Amount: Large (67-100%) Exposed Structure Granulation Quality: Red, Pink Fascia Exposed: No Necrotic Amount: None Present (0%) Fat Layer (Subcutaneous Tissue) Exposed: Yes Tendon Exposed: No Muscle Exposed: No Joint Exposed: No Bone Exposed: No Periwound Skin Texture Texture Color No Abnormalities Noted: Yes No Abnormalities Noted: No Hemosiderin Staining: Yes Moisture No Abnormalities Noted: No Temperature / Pain Maceration: Yes Temperature: No Abnormality Tenderness on Palpation: Yes Treatment Notes [name], [name] ([id number]) [id number] Page 8 of 8 Wound #7 (Lower Leg) Wound Laterality: Left, Anterior Cleanser Peri-Wound Care Topical Primary Dressing Maxorb Extra Ag+ Alginate Dressing, 4x4.75 (in/in) Discharge Instruction: Apply to wound bed as instructed Secondary Dressing Woven Gauze Sponge, Non-Sterile 4x4 in Discharge Instruction: Apply over primary dressing as directed. Secured With [name][employer] 4x5 (in/yd) Discharge Instruction: Secure with Coban as directed. Conforming Stretch Gauze Bandage, Sterile 2x75 (in/in) Discharge Instruction: Secure with stretch gauze as directed. Compression Wrap Compression Stockings Add-Ons Electronic Signature(s) Signed: 05/24/2023 12:03:42 PM By: [name] Deed RN, BSN Signed: 05/25/2023 12:52:02 PM By: Thayer Dallas Entered By: Thayer Dallas on 05/23/2023 13:13:26 -------------------------------------------------------------------------------- Vitals Details Patient Name: Date of Service: [place], [name] 05/23/2023 12:30 PM Medical Record Number: [id number] Patient  Account Number: [id number] Date of Birth/Sex: Treating RN: [birth year] [name] (89 y.o. [name] Primary Care Demonte Dobratz: [name] Other Clinician: Referring Liviya Santini: Treating Xavius Spadafore/Extender: [name] in Treatment: 0 Vital Signs Time Taken: 12:54 Temperature (F): 98 Pulse (bpm): 72 Respiratory Rate (breaths/min): 18 Blood Pressure (mmHg): 159/72 Reference Range: 80 - 120 mg / dl Electronic Signature(s) Signed: 05/24/2023 12:03:42 PM By: [name] Deed RN, BSN Entered By: [name] on 05/23/2023 12:54:34

## 2023-05-30 ENCOUNTER — Encounter (HOSPITAL_BASED_OUTPATIENT_CLINIC_OR_DEPARTMENT_OTHER): Payer: PPO | Admitting: Physician Assistant

## 2023-05-30 DIAGNOSIS — L97822 Non-pressure chronic ulcer of other part of left lower leg with fat layer exposed: Secondary | ICD-10-CM | POA: Diagnosis not present

## 2023-05-30 DIAGNOSIS — S81802A Unspecified open wound, left lower leg, initial encounter: Secondary | ICD-10-CM | POA: Diagnosis not present

## 2023-05-30 DIAGNOSIS — I87332 Chronic venous hypertension (idiopathic) with ulcer and inflammation of left lower extremity: Secondary | ICD-10-CM | POA: Diagnosis not present

## 2023-05-30 NOTE — Progress Notes (Signed)
[name], [name] ([id number]) [id number] Page 1 of 7 Visit Report for 05/30/2023 Chief Complaint Document Details Patient Name: Date of Service: [name], [name] Patient Account Number: [id number] Date of Birth/Sex: Treating RN: 28-Jul-1933 (89 y.o. F) Primary Care Provider: [name] Other Clinician: Referring Provider: Treating Provider/Extender: [name] in Treatment: 1 Information Obtained from: Patient Chief Complaint Left LE Ulcer Electronic Signature(s) Signed: 05/30/2023 1:59:03 PM By: Allen Derry PA-C Entered By: Allen Derry on 05/30/2023 13:59:03 -------------------------------------------------------------------------------- HPI Details Patient Name: Date of Service: [name], [name] 05/30/2023 1:30 PM Medical Record Number: [name] Patient Account Number: [id number] Date of Birth/Sex: Treating RN: 07/08/1933 (89 y.o. F) Primary Care Provider: [name] Other Clinician: Referring Provider: Treating Provider/Extender: [name] in Treatment: 1 History of Present Illness HPI Description: READMISSION 07/15/2019 Patient is now an 89 year old woman who was previously here in 2016 and 2018. Cared for at the time by Dr. Meyer Russel. Both times with wounds related to trauma in the left leg. She was felt to have underlying venous insufficiency although both occasions were related to trauma. The patient tells me that 2 weeks ago she hit her lateral left leg on the car door. This was a skin tear with a flap for a period of time she has been using Polysporin. The flap came off recently she has a clean looking superficial wound on the left lateral leg. Our intake nurse reported some drainage. Past medical history; includes sensorineural hearing loss, osteoarthritis, hypertension and a hammertoe on the right foot for which she follows with  podiatry. ABIs in our clinic were 1.19 on the left 07/21/2019; the patient did not like the wraps. They slid down and rub the wound the wound is measuring larger she is upset. 10/12; left lateral leg wound. Most of this looks healthy even under illumination. We have been using Hydrofera Blue under border foam. She would not allow compression 10/19; left lateral leg wound. 2 small open areas remain of the original wound. We have been using Hydrofera Blue under border foam. This seems to be making decent progress 10/26 left lateral leg traumatic wound. There is no open wound remaining. We have been using Hydrofera Blue under border foam she arrived with some denuded epithelium that I removed there is no open wound remaining. Is fairly clear she has some degree of chronic venous insufficiency with not a lot of edema but dilated veins in her feet. She reminds me that she also has reactive arthritis and was on prednisone for a prolonged period of time. Nevertheless I think it would be beneficial for her to at least wear support stockings but right now I do not think she is going to agree to the Readmission: 02/18/2020 upon evaluation today patient has sustained a skin tear which occurred about 1 week ago she tells me. Fortunately there does not appear to be any signs of active infection at this time which is excellent news. She has been tolerating the dressing changes without complication. Overall very pleased with where things stand at this point. The only issue that I see here is that the skin flap somewhat folded back and then reattached further down causing an area that is actually bunched up to form where it closed on itself but then reattached on the end of the tissue. Nonetheless I think we have to trim off the bunched up tissue in order to allow  this to heal appropriately. That is the only issue I really see today. 03/10/2020 upon evaluation today patient actually appears to be doing excellent at  this time. She has healed quite nicely and has been a couple of weeks since Cameron ([id number]) [phone].pdf Page 2 of 7 I saw her. Overall I feel like she is completely healed and ready for discharge as of today. Readmission: 03-21-2023 upon evaluation today patient appears to be doing somewhat poorly in regard to the left wound ulcer she tells me has been present for about a month. She tells me that she had this on the metal flatbed shopping carts at [facility] and subsequently has been having issues here with this since she tells me that the original Band-Aid she was using caused this to get worse. With that being said I do not see any evidence of active infection at this time everything seems to really be doing quite well as far as I am concerned. 03-28-2023 upon evaluation today patient appears to be doing well currently in regard to her wounds which in fact appear to be completely healed. I am very pleased with this. Readmission: 05-23-2023 upon evaluation patient presents for reevaluation here in the clinic concerning issues that she has been having with her left anterior lower extremity. This actually appears to have a significant amount of new skin growth over top of the areas of irritation I am not exactly sure what happened here to be perfectly honest. I explained to the patient that this is kind of an unusual presentation for this to be this irritated and yet not have any significant openings at this time. I do not see any evidence of active infection locally or systemically which is great news. 05-30-2023 upon evaluation today patient appears to be doing well currently in regard to her lower extremity wounds which are actually drying out the may be a little bit too dry. I think PolyMem may be better for her and we discussed that today. Fortunately I do not see any signs of active infection locally or systemically which is great news. Electronic  Signature(s) Signed: 05/30/2023 2:24:35 PM By: Allen Derry PA-C Entered By: Allen Derry on 05/30/2023 14:24:34 -------------------------------------------------------------------------------- Physical Exam Details Patient Name: Date of Service: [name], [name] 05/30/2023 1:30 PM Medical Record Number: [id number] Patient Account Number: [id number] Date of Birth/Sex: Treating RN: 1932-11-03 (89 y.o. F) Primary Care Provider: [name] Other Clinician: Referring Provider: Treating Provider/Extender: [name] in Treatment: 1 Constitutional Well-nourished and well-hydrated in no acute distress. Respiratory normal breathing without difficulty. Psychiatric this patient is able to make decisions and demonstrates good insight into disease process. Alert and Oriented x 3. pleasant and cooperative. Notes Upon inspection patient's wound bed actually showed signs of good granulation epithelization at this point. Fortunately I do not see any signs of worsening overall and I do believe that the patient is making headway towards closure. Overall I am extremely pleased with the lower leg. Unfortunately on the upper part she actually had a skin tear this was somewhat rolled under. I was actually able to unrolled this and reapproximate the edges using Steri-Strips and Skin-Prep hopefully this will allow this to heal much more effectively. Electronic Signature(s) Signed: 05/30/2023 2:25:06 PM By: Allen Derry PA-C Entered By: Allen Derry on 05/30/2023 14:25:06 -------------------------------------------------------------------------------- Physician Orders Details Patient Name: Date of Service: [place], [name] 05/30/2023 1:30 PM Medical Record Number: [id number] Patient Account Number: [id number] Date of Birth/Sex:  Treating RN: 02/22/1933 (89 y.o. Arta Silence Primary Care Provider: [name] Other Clinician: Referring Provider: Treating Provider/Extender:  Alan Mulder [place], [place] ([id number]) [id number] Page 3 of 7 Weeks in Treatment: 1 Verbal / Phone Orders: No Diagnosis Coding ICD-10 Coding Code Description [phone] Chronic venous hypertension (idiopathic) with ulcer and inflammation of left lower extremity L97.822 Non-pressure chronic ulcer of other part of left lower leg with fat layer exposed L97.822 Non-pressure chronic ulcer of other part of left lower leg with fat layer exposed I10 Essential (primary) hypertension Follow-up Appointments ppointment in 1 week. - with [facility] Wednesday 330 06/06/2023 room 7 Return A ppointment in 2 weeks. Leonard Schwartz Wednesday 06/13/2023 room 9 (Dr. Leanord Hawking covering) 2pm Return A Return appointment in 3 weeks. Leonard Schwartz Wednesday 06/20/2023 room 9 2pm Anesthetic Wound #7 Left,Anterior Lower Leg (In clinic) Topical Lidocaine 4% applied to wound bed Bathing/ Shower/ Hygiene May shower and wash wound with soap and water. Edema Control - Lymphedema / SCD / Other Elevate legs to the level of the heart or above for 30 minutes daily and/or when sitting for 3-4 times a day throughout the day. Avoid standing for long periods of time. Exercise regularly Wound Treatment Wound #7 - Lower Leg Wound Laterality: Left, Anterior Cleanser: Soap and Water 1 x Per Week/30 Days Discharge Instructions: May shower and wash wound with dial antibacterial soap and water prior to dressing change. Peri-Wound Care: Sween Lotion (Moisturizing lotion) 1 x Per Week/30 Days Discharge Instructions: Apply moisturizing lotion as directed Prim Dressing: PolyMem Non-Adhesive Dressing, 4x4 in 1 x Per Week/30 Days ary Discharge Instructions: Apply to wound bed as instructed Secondary Dressing: Woven Gauze Sponge, Non-Sterile 4x4 in 1 x Per Week/30 Days Discharge Instructions: Apply over primary dressing as directed. Compression Wrap: Urgo K2 Lite, (equivalent to a 3 layer) two layer compression  system, regular 1 x Per Week/30 Days Discharge Instructions: Apply Urgo K2 Lite as directed (alternative to 3 layer compression). Wound #8 - Lower Leg Wound Laterality: Left, Medial Cleanser: Soap and Water 1 x Per Week/30 Days Discharge Instructions: May shower and wash wound with dial antibacterial soap and water prior to dressing change. Peri-Wound Care: Sween Lotion (Moisturizing lotion) 1 x Per Week/30 Days Discharge Instructions: Apply moisturizing lotion as directed Prim Dressing: zetvuit 4x4 nonborder 1 x Per Week/30 Days ary Discharge Instructions: apply direclty over the wound. Compression Wrap: Urgo K2 Lite, (equivalent to a 3 layer) two layer compression system, regular 1 x Per Week/30 Days Discharge Instructions: Apply Urgo K2 Lite as directed (alternative to 3 layer compression). Electronic Signature(s) Unsigned Entered By: Shawn Stall on 05/30/2023 14:19:46 Signature(s): NAVAE, FRANCZAK ([id number]) [id number] Date(s): [id number] Page 4 of 7 -------------------------------------------------------------------------------- Problem List Details Patient Name: Date of Service: [place], [name] 05/30/2023 1:30 PM Medical Record Number: [id number] Patient Account Number: [id number] Date of Birth/Sex: Treating RN: 06-02-1933 (89 y.o. [name], [place] Primary Care Provider: [name] Other Clinician: Referring Provider: Treating Provider/Extender: [name] in Treatment: 1 Active Problems ICD-10 Encounter Code Description Active Date MDM Diagnosis I87.332 Chronic venous hypertension (idiopathic) with ulcer and inflammation of left 05/23/2023 No Yes lower extremity L97.822 Non-pressure chronic ulcer of other part of left lower leg with fat layer exposed8/04/2023 No Yes L97.822 Non-pressure chronic ulcer of other part of left lower leg with fat layer exposed8/04/2023 No Yes I10 Essential (primary) hypertension 05/23/2023 No  Yes Inactive Problems Resolved Problems Electronic Signature(s) Signed:  05/30/2023 1:58:49 PM By: Allen Derry PA-C Entered By: Allen Derry on 05/30/2023 13:58:48 -------------------------------------------------------------------------------- Progress Note Details Patient Name: Date of Service: [place], [name] 05/30/2023 1:30 PM Medical Record Number: [id number] Patient Account Number: [id number] Date of Birth/Sex: Treating RN: [birth year] (89 y.o. F) Primary Care Provider: [name] Other Clinician: Referring Provider: Treating Provider/Extender: [name] in Treatment: 1 Subjective Chief Complaint Information obtained from Patient Left LE Ulcer History of Present Illness (HPI) READMISSION 07/15/2019 Patient is now an 89 year old woman who was previously here in 2016 and 2018. Cared for at the time by Dr. Meyer Russel. Both times with wounds related to trauma [name], [name] ([id number]) [id number] Page 5 of 7 in the left leg. She was felt to have underlying venous insufficiency although both occasions were related to trauma. The patient tells me that 2 weeks ago she hit her lateral left leg on the car door. This was a skin tear with a flap for a period of time she has been using Polysporin. The flap came off recently she has a clean looking superficial wound on the left lateral leg. Our intake nurse reported some drainage. Past medical history; includes sensorineural hearing loss, osteoarthritis, hypertension and a hammertoe on the right foot for which she follows with podiatry. ABIs in our clinic were 1.19 on the left 07/21/2019; the patient did not like the wraps. They slid down and rub the wound the wound is measuring larger she is upset. 10/12; left lateral leg wound. Most of this looks healthy even under illumination. We have been using Hydrofera Blue under border foam. She would not allow compression 10/19; left lateral leg  wound. 2 small open areas remain of the original wound. We have been using Hydrofera Blue under border foam. This seems to be making decent progress 10/26 left lateral leg traumatic wound. There is no open wound remaining. We have been using Hydrofera Blue under border foam she arrived with some denuded epithelium that I removed there is no open wound remaining. Is fairly clear she has some degree of chronic venous insufficiency with not a lot of edema but dilated veins in her feet. She reminds me that she also has reactive arthritis and was on prednisone for a prolonged period of time. Nevertheless I think it would be beneficial for her to at least wear support stockings but right now I do not think she is going to agree to the Readmission: 02/18/2020 upon evaluation today patient has sustained a skin tear which occurred about 1 week ago she tells me. Fortunately there does not appear to be any signs of active infection at this time which is excellent news. She has been tolerating the dressing changes without complication. Overall very pleased with where things stand at this point. The only issue that I see here is that the skin flap somewhat folded back and then reattached further down causing an area that is actually bunched up to form where it closed on itself but then reattached on the end of the tissue. Nonetheless I think we have to trim off the bunched up tissue in order to allow this to heal appropriately. That is the only issue I really see today. 03/10/2020 upon evaluation today patient actually appears to be doing excellent at this time. She has healed quite nicely and has been a couple of weeks since I saw her. Overall I feel like she is completely healed and ready for discharge as of today. Readmission: 03-21-2023 upon  evaluation today patient appears to be doing somewhat poorly in regard to the left wound ulcer she tells me has been present for about a month. She tells me that she had this  on the metal flatbed shopping carts at [facility] and subsequently has been having issues here with this since she tells me that the original Band-Aid she was using caused this to get worse. With that being said I do not see any evidence of active infection at this time everything seems to really be doing quite well as far as I am concerned. 03-28-2023 upon evaluation today patient appears to be doing well currently in regard to her wounds which in fact appear to be completely healed. I am very pleased with this. Readmission: 05-23-2023 upon evaluation patient presents for reevaluation here in the clinic concerning issues that she has been having with her left anterior lower extremity. This actually appears to have a significant amount of new skin growth over top of the areas of irritation I am not exactly sure what happened here to be perfectly honest. I explained to the patient that this is kind of an unusual presentation for this to be this irritated and yet not have any significant openings at this time. I do not see any evidence of active infection locally or systemically which is great news. 05-30-2023 upon evaluation today patient appears to be doing well currently in regard to her lower extremity wounds which are actually drying out the may be a little bit too dry. I think PolyMem may be better for her and we discussed that today. Fortunately I do not see any signs of active infection locally or systemically which is great news. Objective Constitutional Well-nourished and well-hydrated in no acute distress. Vitals Time Taken: 1:49 PM, Temperature: 98.0 F, Pulse: 59 bpm, Respiratory Rate: 18 breaths/min, Blood Pressure: 147/73 mmHg. Respiratory normal breathing without difficulty. Psychiatric this patient is able to make decisions and demonstrates good insight into disease process. Alert and Oriented x 3. pleasant and cooperative. General Notes: Upon inspection patient's wound bed actually showed  signs of good granulation epithelization at this point. Fortunately I do not see any signs of worsening overall and I do believe that the patient is making headway towards closure. Overall I am extremely pleased with the lower leg. Unfortunately on the upper part she actually had a skin tear this was somewhat rolled under. I was actually able to unrolled this and reapproximate the edges using Steri-Strips and Skin-Prep hopefully this will allow this to heal much more effectively. Integumentary (Hair, Skin) Wound #7 status is Open. Original cause of wound was Trauma. The date acquired was: 02/23/2023. The wound has been in treatment 1 weeks. The wound is located on the Left,Anterior Lower Leg. The wound measures 3.5cm length x 3.5cm width x 0.1cm depth; 9.621cm^2 area and 0.962cm^3 volume. There is Fat Layer (Subcutaneous Tissue) exposed. There is a medium amount of serous drainage noted. The wound margin is flat and intact. There is large (67-100%) red, pink granulation within the wound bed. There is no necrotic tissue within the wound bed. The periwound skin appearance had no abnormalities noted for texture. The periwound skin appearance exhibited: Maceration, Hemosiderin Staining. Periwound temperature was noted as No Abnormality. The periwound has tenderness on palpation. Wound #8 status is Open. Original cause of wound was Trauma. The date acquired was: 05/28/2023. The wound is located on the Left,Medial Lower Leg. The wound measures 1.5cm length x 1.7cm width x 0.1cm depth; 2.003cm^2 area and  0.2cm^3 volume. There is Fat Layer (Subcutaneous Tissue) exposed. There is no tunneling or undermining noted. There is a medium amount of serosanguineous drainage noted. The wound margin is distinct with the outline attached to the wound base. There is large (67-100%) red granulation within the wound bed. There is no necrotic tissue within the wound bed. The periwound skin appearance did not exhibit: Callus,  Crepitus, Excoriation, Induration, Rash, Scarring, Dry/Scaly, Maceration, Atrophie Blanche, Cyanosis, Ecchymosis, Hemosiderin [name], [name] ([id number]) [phone].pdf Page 6 of 7 Staining, Mottled, Pallor, Rubor, Erythema. Assessment Active Problems ICD-10 Chronic venous hypertension (idiopathic) with ulcer and inflammation of left lower extremity Non-pressure chronic ulcer of other part of left lower leg with fat layer exposed Non-pressure chronic ulcer of other part of left lower leg with fat layer exposed Essential (primary) hypertension Procedures Wound #7 Pre-procedure diagnosis of Wound #7 is a Venous Leg Ulcer located on the Left,Anterior Lower Leg . There was a Double Layer Compression Therapy Procedure by Shawn Stall, RN. Post procedure Diagnosis Wound #7: Same as Pre-Procedure Wound #8 Pre-procedure diagnosis of Wound #8 is a Trauma, Other located on the Left,Medial Lower Leg . There was a Double Layer Compression Therapy Procedure by Shawn Stall, RN. Post procedure Diagnosis Wound #8: Same as Pre-Procedure Plan Follow-up Appointments: Return Appointment in 1 week. - with [facility] Wednesday 330 06/06/2023 room 7 Return Appointment in 2 weeks. [facility] Schwartz Wednesday 06/13/2023 room 9 (Dr. Leanord Hawking covering) 2pm Return appointment in 3 weeks. Leonard Schwartz Wednesday 06/20/2023 room 9 2pm Anesthetic: Wound #7 Left,Anterior Lower Leg: (In clinic) Topical Lidocaine 4% applied to wound bed Bathing/ Shower/ Hygiene: May shower and wash wound with soap and water. Edema Control - Lymphedema / SCD / Other: Elevate legs to the level of the heart or above for 30 minutes daily and/or when sitting for 3-4 times a day throughout the day. Avoid standing for long periods of time. Exercise regularly WOUND #7: - Lower Leg Wound Laterality: Left, Anterior Cleanser: Soap and Water 1 x Per Week/30 Days Discharge Instructions: May shower and wash wound with dial antibacterial soap and  water prior to dressing change. Peri-Wound Care: Sween Lotion (Moisturizing lotion) 1 x Per Week/30 Days Discharge Instructions: Apply moisturizing lotion as directed Prim Dressing: PolyMem Non-Adhesive Dressing, 4x4 in 1 x Per Week/30 Days ary Discharge Instructions: Apply to wound bed as instructed Secondary Dressing: Woven Gauze Sponge, Non-Sterile 4x4 in 1 x Per Week/30 Days Discharge Instructions: Apply over primary dressing as directed. Com pression Wrap: Urgo K2 Lite, (equivalent to a 3 layer) two layer compression system, regular 1 x Per Week/30 Days Discharge Instructions: Apply Urgo K2 Lite as directed (alternative to 3 layer compression). WOUND #8: - Lower Leg Wound Laterality: Left, Medial Cleanser: Soap and Water 1 x Per Week/30 Days Discharge Instructions: May shower and wash wound with dial antibacterial soap and water prior to dressing change. Peri-Wound Care: Sween Lotion (Moisturizing lotion) 1 x Per Week/30 Days Discharge Instructions: Apply moisturizing lotion as directed Prim Dressing: zetvuit 4x4 nonborder 1 x Per Week/30 Days ary Discharge Instructions: apply direclty over the wound. Com pression Wrap: Urgo K2 Lite, (equivalent to a 3 layer) two layer compression system, regular 1 x Per Week/30 Days Discharge Instructions: Apply Urgo K2 Lite as directed (alternative to 3 layer compression). 1. I would recommend that we have the patient continue to monitor for any signs of infection or worsening. Based on what I am seeing I do believe that the patient is continuing to show signs  of excellent improvement with regard to her lower leg although it was a little dry I would recommend that we try to see if we can use PolyMem to keep this from drying out too much. 2. I am going to recommend as well that she should continue with the ABD pads or Zetuvit which I think is doing a better over the upper wound as well as over the lower wounds. With the compression wrap on hoping we  will be able to keep this under better control. 3. We are going to initiate treatment with an Urgo K2 lite compression wrap. We will see patient back for reevaluation in 1 week here in the clinic. If anything worsens or changes patient will contact our office for additional recommendations. [name], [name] ([id number]) [id number] Page 7 of 7 Electronic Signature(s) Signed: 05/30/2023 2:26:14 PM By: Allen Derry PA-C Entered By: Allen Derry on 05/30/2023 14:26:14 -------------------------------------------------------------------------------- SuperBill Details Patient Name: Date of Service: [place], [name] 05/30/2023 Medical Record Number: [id number] Patient Account Number: [id number] Date of Birth/Sex: Treating RN: 10/11/1933 (89 y.o. [name], [place] Primary Care Provider: [name] Other Clinician: Referring Provider: Treating Provider/Extender: [name] in Treatment: 1 Diagnosis Coding ICD-10 Codes Code Description [phone] Chronic venous hypertension (idiopathic) with ulcer and inflammation of left lower extremity L97.822 Non-pressure chronic ulcer of other part of left lower leg with fat layer exposed L97.822 Non-pressure chronic ulcer of other part of left lower leg with fat layer exposed I10 Essential (primary) hypertension Facility Procedures : CPT4 Code: [id number] Description: (Facility Use Only) [id number] - APPLY MULTLAY COMPRS LWR LT LEG Modifier: Quantity: 1 Physician Procedures : CPT4 Code Description Modifier [id number] - WC PHYS LEVEL 3 - EST PT ICD-10 Diagnosis Description I87.332 Chronic venous hypertension (idiopathic) with ulcer and inflammation of left lower extremity L97.822 Non-pressure chronic ulcer of other part  of left lower leg with fat layer exposed I10 Essential (primary) hypertension Quantity: 1 Electronic Signature(s) Signed: 05/30/2023 2:28:28 PM By: Allen Derry PA-C Entered By: Allen Derry on 05/30/2023 32:44:01

## 2023-06-04 DIAGNOSIS — M0609 Rheumatoid arthritis without rheumatoid factor, multiple sites: Secondary | ICD-10-CM | POA: Diagnosis not present

## 2023-06-06 ENCOUNTER — Encounter (HOSPITAL_BASED_OUTPATIENT_CLINIC_OR_DEPARTMENT_OTHER): Payer: PPO | Admitting: Physician Assistant

## 2023-06-06 ENCOUNTER — Ambulatory Visit (HOSPITAL_BASED_OUTPATIENT_CLINIC_OR_DEPARTMENT_OTHER): Payer: PPO | Admitting: Physician Assistant

## 2023-06-06 DIAGNOSIS — S81802A Unspecified open wound, left lower leg, initial encounter: Secondary | ICD-10-CM | POA: Diagnosis not present

## 2023-06-06 DIAGNOSIS — I872 Venous insufficiency (chronic) (peripheral): Secondary | ICD-10-CM | POA: Diagnosis not present

## 2023-06-06 DIAGNOSIS — L97822 Non-pressure chronic ulcer of other part of left lower leg with fat layer exposed: Secondary | ICD-10-CM | POA: Diagnosis not present

## 2023-06-06 DIAGNOSIS — I87332 Chronic venous hypertension (idiopathic) with ulcer and inflammation of left lower extremity: Secondary | ICD-10-CM | POA: Diagnosis not present

## 2023-06-06 NOTE — Progress Notes (Addendum)
RICARDO, MAIRS ([id number]) [id number] Page 1 of 9 Visit Report for 06/06/2023 Chief Complaint Document Details Patient Name: Date of Service: [name], [name] [name] 06/06/2023 3:30 PM Medical Record Number: [id number] Patient Account Number: [id number] Date of Birth/Sex: Treating RN: 09-Feb-1933 (89 y.o. F) Primary Care Provider: [name] Other Clinician: Referring Provider: Treating Provider/Extender: [name] in Treatment: 2 Information Obtained from: Patient Chief Complaint Left LE Ulcer Electronic Signature(s) Signed: 06/06/2023 3:40:35 PM By: Allen Derry PA-C Entered By: Allen Derry on 06/06/2023 12:40:35 -------------------------------------------------------------------------------- Debridement Details Patient Name: Date of Service: [name], [name] 06/06/2023 3:30 PM Medical Record Number: [id number] Patient Account Number: [id number] Date of Birth/Sex: Treating RN: 04/23/1933 (89 y.o. Fredderick Phenix Primary Care Provider: [name] Other Clinician: Referring Provider: Treating Provider/Extender: [name] in Treatment: 2 Debridement Performed for Assessment: Wound #7 Left,Anterior Lower Leg Performed By: Physician Lenda Kelp, PA Debridement Type: Debridement Severity of Tissue Pre Debridement: Fat layer exposed Level of Consciousness (Pre-procedure): Awake and Alert Pre-procedure Verification/Time Out Yes - 16:33 Taken: Start Time: 16:33 Percent of Wound Bed Debrided: 100% T Area Debrided (cm): otal 1.18 Tissue and other material debrided: Non-Viable, Eschar Level: Non-Viable Tissue Debridement Description: Selective/Open Wound Instrument: Curette Bleeding: Minimum Hemostasis Achieved: Pressure Response to Treatment: Procedure was tolerated well Level of Consciousness (Post- Awake and Alert procedure): Post Debridement Measurements of Total Wound Length:  (cm) 1.5 Width: (cm) 1 Depth: (cm) 0.1 Volume: (cm) 0.118 Character of Wound/Ulcer Post Debridement: Improved Severity of Tissue Post Debridement: Fat layer exposed Post Procedure Diagnosis Same as Pre-procedure SHEFALI, BECHERER L ([id number]) [phone] Page 2 of 9 Notes scribed for Allen Derry, [place] by Samuella Bruin, RN Electronic Signature(s) Signed: 06/06/2023 4:49:44 PM By: Samuella Bruin Signed: 06/06/2023 5:16:52 PM By: Allen Derry PA-C Entered By: Samuella Bruin on 06/06/2023 13:34:34 -------------------------------------------------------------------------------- Debridement Details Patient Name: Date of Service: [name], [name] 06/06/2023 3:30 PM Medical Record Number: [id number] Patient Account Number: [id number] Date of Birth/Sex: Treating RN: 12/25/1932 (89 y.o. Fredderick Phenix Primary Care Provider: [name] Other Clinician: Referring Provider: Treating Provider/Extender: [name] in Treatment: 2 Debridement Performed for Assessment: Wound #8 Left,Medial Lower Leg Performed By: Physician Lenda Kelp, PA Debridement Type: Debridement Level of Consciousness (Pre-procedure): Awake and Alert Pre-procedure Verification/Time Out Yes - 16:33 Taken: Start Time: 16:33 Percent of Wound Bed Debrided: 100% T Area Debrided (cm): otal 0.16 Tissue and other material debrided: Non-Viable, Slough, Biofilm, Slough Level: Non-Viable Tissue Debridement Description: Selective/Open Wound Instrument: Curette Bleeding: Minimum Hemostasis Achieved: Pressure Response to Treatment: Procedure was tolerated well Level of Consciousness (Post- Awake and Alert procedure): Post Debridement Measurements of Total Wound Length: (cm) 0.5 Width: (cm) 0.4 Depth: (cm) 0.1 Volume: (cm) 0.016 Character of Wound/Ulcer Post Debridement: Improved Post Procedure Diagnosis Same as Pre-procedure Notes scribed for Allen Derry, PA by Samuella Bruin, RN Electronic Signature(s) Signed: 06/06/2023 4:49:44 PM By: Samuella Bruin Signed: 06/06/2023 5:16:52 PM By: Allen Derry PA-C Entered By: Samuella Bruin on 06/06/2023 13:35:38 -------------------------------------------------------------------------------- HPI Details Patient Name: Date of Service: [name]. 06/06/2023 3:30 PM Medical Record Number: [id number] Patient Account Number: [id number] [name], [name] ([id number]) [id number] Page 3 of 9 Date of Birth/Sex: Treating RN: 06-21-1933 (89 y.o. F) Primary Care Provider: Other Clinician: [name] Referring Provider: Treating Provider/Extender: [name] in Treatment: 2 History of Present Illness HPI Description:  READMISSION 07/15/2019 Patient is now an 89 year old woman who was previously here in 2016 and 2018. Cared for at the time by Dr. Meyer Russel. Both times with wounds related to trauma in the left leg. She was felt to have underlying venous insufficiency although both occasions were related to trauma. The patient tells me that 2 weeks ago she hit her lateral left leg on the car door. This was a skin tear with a flap for a period of time she has been using Polysporin. The flap came off recently she has a clean looking superficial wound on the left lateral leg. Our intake nurse reported some drainage. Past medical history; includes sensorineural hearing loss, osteoarthritis, hypertension and a hammertoe on the right foot for which she follows with podiatry. ABIs in our clinic were 1.19 on the left 07/21/2019; the patient did not like the wraps. They slid down and rub the wound the wound is measuring larger she is upset. 10/12; left lateral leg wound. Most of this looks healthy even under illumination. We have been using Hydrofera Blue under border foam. She would not allow compression 10/19; left lateral leg wound. 2 small open  areas remain of the original wound. We have been using Hydrofera Blue under border foam. This seems to be making decent progress 10/26 left lateral leg traumatic wound. There is no open wound remaining. We have been using Hydrofera Blue under border foam she arrived with some denuded epithelium that I removed there is no open wound remaining. Is fairly clear she has some degree of chronic venous insufficiency with not a lot of edema but dilated veins in her feet. She reminds me that she also has reactive arthritis and was on prednisone for a prolonged period of time. Nevertheless I think it would be beneficial for her to at least wear support stockings but right now I do not think she is going to agree to the Readmission: 02/18/2020 upon evaluation today patient has sustained a skin tear which occurred about 1 week ago she tells me. Fortunately there does not appear to be any signs of active infection at this time which is excellent news. She has been tolerating the dressing changes without complication. Overall very pleased with where things stand at this point. The only issue that I see here is that the skin flap somewhat folded back and then reattached further down causing an area that is actually bunched up to form where it closed on itself but then reattached on the end of the tissue. Nonetheless I think we have to trim off the bunched up tissue in order to allow this to heal appropriately. That is the only issue I really see today. 03/10/2020 upon evaluation today patient actually appears to be doing excellent at this time. She has healed quite nicely and has been a couple of weeks since I saw her. Overall I feel like she is completely healed and ready for discharge as of today. Readmission: 03-21-2023 upon evaluation today patient appears to be doing somewhat poorly in regard to the left wound ulcer she tells me has been present for about a month. She tells me that she had this on the metal flatbed  shopping carts at [facility] and subsequently has been having issues here with this since she tells me that the original Band-Aid she was using caused this to get worse. With that being said I do not see any evidence of active infection at this time everything seems to really be doing quite well as far  as I am concerned. 03-28-2023 upon evaluation today patient appears to be doing well currently in regard to her wounds which in fact appear to be completely healed. I am very pleased with this. Readmission: 05-23-2023 upon evaluation patient presents for reevaluation here in the clinic concerning issues that she has been having with her left anterior lower extremity. This actually appears to have a significant amount of new skin growth over top of the areas of irritation I am not exactly sure what happened here to be perfectly honest. I explained to the patient that this is kind of an unusual presentation for this to be this irritated and yet not have any significant openings at this time. I do not see any evidence of active infection locally or systemically which is great news. 05-30-2023 upon evaluation today patient appears to be doing well currently in regard to her lower extremity wounds which are actually drying out the may be a little bit too dry. I think PolyMem may be better for her and we discussed that today. Fortunately I do not see any signs of active infection locally or systemically which is great news. 06-06-2023 upon evaluation today patient appears to be doing well currently in regard to her wounds. Both are showing signs of improvement which is great news. Fortunately I do not see any signs of active infection locally or systemically at this time. Electronic Signature(s) Signed: 06/06/2023 4:39:18 PM By: Allen Derry PA-C Entered By: Allen Derry on 06/06/2023 13:39:17 -------------------------------------------------------------------------------- Physical Exam Details Patient Name: Date of  Service: [place], [name] 06/06/2023 3:30 PM Medical Record Number: [id number] Patient Account Number: [id number] Date of Birth/Sex: Treating RN: 12-Sep-1933 (89 y.o. F) Primary Care Provider: [name] Other Clinician: Referring Provider: Treating Provider/Extender: Madelyn Flavors, [name] in Treatment: 2 MERILYNN, METTERT ([id number]) [id number] Page 4 of 9 Constitutional Well-nourished and well-hydrated in no acute distress. Respiratory normal breathing without difficulty. Psychiatric this patient is able to make decisions and demonstrates good insight into disease process. Alert and Oriented x 3. pleasant and cooperative. Notes Upon inspection patient's wound bed actually showed signs of good granulation epithelization at this point. Fortunately I do not see any evidence of infection at this time overall I do believe that the patient is making good headway towards complete closure. Electronic Signature(s) Signed: 06/06/2023 4:39:35 PM By: Allen Derry PA-C Entered By: Allen Derry on 06/06/2023 13:39:34 -------------------------------------------------------------------------------- Physician Orders Details Patient Name: Date of Service: [place], [name] 06/06/2023 3:30 PM Medical Record Number: [id number] Patient Account Number: [id number] Date of Birth/Sex: Treating RN: 12/05/1932 (89 y.o. [name] Primary Care Provider: [name] Other Clinician: Referring Provider: Treating Provider/Extender: [name] in Treatment: 2 Verbal / Phone Orders: No Diagnosis Coding ICD-10 Coding Code Description [phone] Chronic venous hypertension (idiopathic) with ulcer and inflammation of left lower extremity L97.822 Non-pressure chronic ulcer of other part of left lower leg with fat layer exposed L97.822 Non-pressure chronic ulcer of other part of left lower leg with fat layer exposed I10 Essential  (primary) hypertension Follow-up Appointments ppointment in 1 week. - with [facility] Wednesday 06/13/2023 at 2pm Return A ppointment in 2 weeks. [facility] Wednesday room 9 06/20/2023 at 2pm Return A Return appointment in 3 weeks. [facility] Wednesday Bathing/ Shower/ Hygiene May shower and wash wound with soap and water. Edema Control - Lymphedema / SCD / Other Elevate legs to the level of the heart or above for 30  minutes daily and/or when sitting for 3-4 times a day throughout the day. Avoid standing for long periods of time. Exercise regularly Wound Treatment Wound #7 - Lower Leg Wound Laterality: Left, Anterior Cleanser: Soap and Water 1 x Per Week/30 Days Discharge Instructions: May shower and wash wound with dial antibacterial soap and water prior to dressing change. Peri-Wound Care: Sween Lotion (Moisturizing lotion) 1 x Per Week/30 Days Discharge Instructions: Apply moisturizing lotion as directed Prim Dressing: PolyMem Non-Adhesive Dressing, 4x4 in 1 x Per Week/30 Days ary Discharge Instructions: Apply to wound bed as instructed Secondary Dressing: Woven Gauze Sponge, Non-Sterile 4x4 in 1 x Per Week/30 Days Discharge Instructions: Apply over primary dressing as directed. Compression Wrap: Urgo K2 Lite, (equivalent to a 3 layer) two layer compression system, regular 1 x Per Week/30 Days Discharge Instructions: Apply Urgo K2 Lite as directed (alternative to 3 layer compression). NAKISHA, PICKLES ([id number]) [id number] Page 5 of 9 Wound #8 - Lower Leg Wound Laterality: Left, Medial Cleanser: Soap and Water 1 x Per Week/30 Days Discharge Instructions: May shower and wash wound with dial antibacterial soap and water prior to dressing change. Peri-Wound Care: Sween Lotion (Moisturizing lotion) 1 x Per Week/30 Days Discharge Instructions: Apply moisturizing lotion as directed Prim Dressing: PolyMem Non-Adhesive Dressing, 4x4 in 1 x Per Week/30 Days ary Discharge  Instructions: Apply to wound bed as instructed Secondary Dressing: Woven Gauze Sponge, Non-Sterile 4x4 in 1 x Per Week/30 Days Discharge Instructions: Apply over primary dressing as directed. Compression Wrap: Urgo K2 Lite, (equivalent to a 3 layer) two layer compression system, regular 1 x Per Week/30 Days Discharge Instructions: Apply Urgo K2 Lite as directed (alternative to 3 layer compression). Electronic Signature(s) Signed: 06/06/2023 4:49:44 PM By: Samuella Bruin Signed: 06/06/2023 5:16:52 PM By: Allen Derry PA-C Entered By: Samuella Bruin on 06/06/2023 13:38:46 -------------------------------------------------------------------------------- Problem List Details Patient Name: Date of Service: [place], [name] 06/06/2023 3:30 PM Medical Record Number: [id number] Patient Account Number: [id number] Date of Birth/Sex: Treating RN: [birth year] (89 y.o. F) Primary Care Provider: [name] Other Clinician: Referring Provider: Treating Provider/Extender: [name] in Treatment: 2 Active Problems ICD-10 Encounter Code Description Active Date MDM Diagnosis I87.332 Chronic venous hypertension (idiopathic) with ulcer and inflammation of left 05/23/2023 No Yes lower extremity L97.822 Non-pressure chronic ulcer of other part of left lower leg with fat layer exposed8/04/2023 No Yes L97.822 Non-pressure chronic ulcer of other part of left lower leg with fat layer exposed8/04/2023 No Yes I10 Essential (primary) hypertension 05/23/2023 No Yes Inactive Problems Resolved Problems Electronic Signature(s) Signed: 06/06/2023 3:40:26 PM By: Allen Derry PA-C Entered By: Allen Derry on 06/06/2023 12:40:26 Jenna Luo ([id number]) [id number] Page 6 of 9 -------------------------------------------------------------------------------- Progress Note Details Patient Name: Date of Service: [name], [name] 06/06/2023 3:30 PM Medical Record  Number: [id number] Patient Account Number: [id number] Date of Birth/Sex: Treating RN: [birth year] (89 y.o. F) Primary Care Provider: [name] Other Clinician: Referring Provider: Treating Provider/Extender: [name] in Treatment: 2 Subjective Chief Complaint Information obtained from Patient Left LE Ulcer History of Present Illness (HPI) READMISSION 07/15/2019 Patient is now an 89 year old woman who was previously here in 2016 and 2018. Cared for at the time by Dr. Meyer Russel. Both times with wounds related to trauma in the left leg. She was felt to have underlying venous insufficiency although both occasions were related to trauma. The patient tells me that 2 weeks ago she hit her lateral left  leg on the car door. This was a skin tear with a flap for a period of time she has been using Polysporin. The flap came off recently she has a clean looking superficial wound on the left lateral leg. Our intake nurse reported some drainage. Past medical history; includes sensorineural hearing loss, osteoarthritis, hypertension and a hammertoe on the right foot for which she follows with podiatry. ABIs in our clinic were 1.19 on the left 07/21/2019; the patient did not like the wraps. They slid down and rub the wound the wound is measuring larger she is upset. 10/12; left lateral leg wound. Most of this looks healthy even under illumination. We have been using Hydrofera Blue under border foam. She would not allow compression 10/19; left lateral leg wound. 2 small open areas remain of the original wound. We have been using Hydrofera Blue under border foam. This seems to be making decent progress 10/26 left lateral leg traumatic wound. There is no open wound remaining. We have been using Hydrofera Blue under border foam she arrived with some denuded epithelium that I removed there is no open wound remaining. Is fairly clear she has some degree of chronic venous  insufficiency with not a lot of edema but dilated veins in her feet. She reminds me that she also has reactive arthritis and was on prednisone for a prolonged period of time. Nevertheless I think it would be beneficial for her to at least wear support stockings but right now I do not think she is going to agree to the Readmission: 02/18/2020 upon evaluation today patient has sustained a skin tear which occurred about 1 week ago she tells me. Fortunately there does not appear to be any signs of active infection at this time which is excellent news. She has been tolerating the dressing changes without complication. Overall very pleased with where things stand at this point. The only issue that I see here is that the skin flap somewhat folded back and then reattached further down causing an area that is actually bunched up to form where it closed on itself but then reattached on the end of the tissue. Nonetheless I think we have to trim off the bunched up tissue in order to allow this to heal appropriately. That is the only issue I really see today. 03/10/2020 upon evaluation today patient actually appears to be doing excellent at this time. She has healed quite nicely and has been a couple of weeks since I saw her. Overall I feel like she is completely healed and ready for discharge as of today. Readmission: 03-21-2023 upon evaluation today patient appears to be doing somewhat poorly in regard to the left wound ulcer she tells me has been present for about a month. She tells me that she had this on the metal flatbed shopping carts at [facility] and subsequently has been having issues here with this since she tells me that the original Band-Aid she was using caused this to get worse. With that being said I do not see any evidence of active infection at this time everything seems to really be doing quite well as far as I am concerned. 03-28-2023 upon evaluation today patient appears to be doing well currently in  regard to her wounds which in fact appear to be completely healed. I am very pleased with this. Readmission: 05-23-2023 upon evaluation patient presents for reevaluation here in the clinic concerning issues that she has been having with her left anterior lower extremity. This actually appears to  have a significant amount of new skin growth over top of the areas of irritation I am not exactly sure what happened here to be perfectly honest. I explained to the patient that this is kind of an unusual presentation for this to be this irritated and yet not have any significant openings at this time. I do not see any evidence of active infection locally or systemically which is great news. 05-30-2023 upon evaluation today patient appears to be doing well currently in regard to her lower extremity wounds which are actually drying out the may be a little bit too dry. I think PolyMem may be better for her and we discussed that today. Fortunately I do not see any signs of active infection locally or systemically which is great news. 06-06-2023 upon evaluation today patient appears to be doing well currently in regard to her wounds. Both are showing signs of improvement which is great news. Fortunately I do not see any signs of active infection locally or systemically at this time. [name], HOLTZER ([id number]) [id number] Page 7 of 9 Objective Constitutional Well-nourished and well-hydrated in no acute distress. Vitals Time Taken: 4:18 AM, Height: 62 in, Weight: 105 lbs, BMI: 19.2, Temperature: 98.1 F, Pulse: 69 bpm, Respiratory Rate: 18 breaths/min, Blood Pressure: 173/72 mmHg. Respiratory normal breathing without difficulty. Psychiatric this patient is able to make decisions and demonstrates good insight into disease process. Alert and Oriented x 3. pleasant and cooperative. General Notes: Upon inspection patient's wound bed actually showed signs of good granulation epithelization at  this point. Fortunately I do not see any evidence of infection at this time overall I do believe that the patient is making good headway towards complete closure. Integumentary (Hair, Skin) Wound #7 status is Open. Original cause of wound was Trauma. The date acquired was: 02/23/2023. The wound has been in treatment 2 weeks. The wound is located on the Left,Anterior Lower Leg. The wound measures 1.5cm length x 1cm width x 0.1cm depth; 1.178cm^2 area and 0.118cm^3 volume. There is Fat Layer (Subcutaneous Tissue) exposed. There is no tunneling or undermining noted. There is a medium amount of serous drainage noted. The wound margin is flat and intact. There is large (67-100%) red, pink granulation within the wound bed. There is no necrotic tissue within the wound bed. The periwound skin appearance had no abnormalities noted for texture. The periwound skin appearance exhibited: Maceration, Hemosiderin Staining. Periwound temperature was noted as No Abnormality. The periwound has tenderness on palpation. Wound #8 status is Open. Original cause of wound was Trauma. The date acquired was: 05/28/2023. The wound has been in treatment 1 weeks. The wound is located on the Left,Medial Lower Leg. The wound measures 0.5cm length x 0.4cm width x 0.1cm depth; 0.157cm^2 area and 0.016cm^3 volume. There is Fat Layer (Subcutaneous Tissue) exposed. There is no tunneling or undermining noted. There is a medium amount of serosanguineous drainage noted. The wound margin is distinct with the outline attached to the wound base. There is large (67-100%) red, hyper - granulation within the wound bed. There is no necrotic tissue within the wound bed. The periwound skin appearance did not exhibit: Callus, Crepitus, Excoriation, Induration, Rash, Scarring, Dry/Scaly, Maceration, Atrophie Blanche, Cyanosis, Ecchymosis, Hemosiderin Staining, Mottled, Pallor, Rubor, Erythema. Assessment Active Problems ICD-10 Chronic venous  hypertension (idiopathic) with ulcer and inflammation of left lower extremity Non-pressure chronic ulcer of other part of left lower leg with fat layer exposed Non-pressure chronic ulcer of other part of left lower leg with fat layer  exposed Essential (primary) hypertension Procedures Wound #7 Pre-procedure diagnosis of Wound #7 is a Venous Leg Ulcer located on the Left,Anterior Lower Leg .Severity of Tissue Pre Debridement is: Fat layer exposed. There was a Selective/Open Wound Non-Viable Tissue Debridement with a total area of 1.18 sq cm performed by Lenda Kelp, PA. With the following instrument(s): Curette to remove Non-Viable tissue/material. Material removed includes Eschar. No specimens were taken. A time out was conducted at 16:33, prior to the start of the procedure. A Minimum amount of bleeding was controlled with Pressure. The procedure was tolerated well. Post Debridement Measurements: 1.5cm length x 1cm width x 0.1cm depth; 0.118cm^3 volume. Character of Wound/Ulcer Post Debridement is improved. Severity of Tissue Post Debridement is: Fat layer exposed. Post procedure Diagnosis Wound #7: Same as Pre-Procedure General Notes: scribed for Allen Derry, PA by Samuella Bruin, RN. Wound #8 Pre-procedure diagnosis of Wound #8 is a Trauma, Other located on the Left,Medial Lower Leg . There was a Selective/Open Wound Non-Viable Tissue Debridement with a total area of 0.16 sq cm performed by Lenda Kelp, PA. With the following instrument(s): Curette to remove Non-Viable tissue/material. Material removed includes Slough and Biofilm and. No specimens were taken. A time out was conducted at 16:33, prior to the start of the procedure. A Minimum amount of bleeding was controlled with Pressure. The procedure was tolerated well. Post Debridement Measurements: 0.5cm length x 0.4cm width x 0.1cm depth; 0.016cm^3 volume. Character of Wound/Ulcer Post Debridement is improved. Post procedure  Diagnosis Wound #8: Same as Pre-Procedure General Notes: scribed for Allen Derry, PA by Samuella Bruin, RN. Pre-procedure diagnosis of Wound #8 is a Trauma, Other located on the Left,Medial Lower Leg . There was a Double Layer Compression Therapy Procedure by Samuella Bruin, RN. Post procedure Diagnosis Wound #8: Same as Pre-Procedure Plan Follow-up Appointments: Return Appointment in 1 week. - with [facility] Wednesday 06/13/2023 at [facility], [place] ([id number]) [id number] Page 8 of 9 Return Appointment in 2 weeks. Leonard Schwartz Wednesday room 9 06/20/2023 at 2pm Return appointment in 3 weeks. Leonard Schwartz Wednesday Bathing/ Shower/ Hygiene: May shower and wash wound with soap and water. Edema Control - Lymphedema / SCD / Other: Elevate legs to the level of the heart or above for 30 minutes daily and/or when sitting for 3-4 times a day throughout the day. Avoid standing for long periods of time. Exercise regularly WOUND #7: - Lower Leg Wound Laterality: Left, Anterior Cleanser: Soap and Water 1 x Per Week/30 Days Discharge Instructions: May shower and wash wound with dial antibacterial soap and water prior to dressing change. Peri-Wound Care: Sween Lotion (Moisturizing lotion) 1 x Per Week/30 Days Discharge Instructions: Apply moisturizing lotion as directed Prim Dressing: PolyMem Non-Adhesive Dressing, 4x4 in 1 x Per Week/30 Days ary Discharge Instructions: Apply to wound bed as instructed Secondary Dressing: Woven Gauze Sponge, Non-Sterile 4x4 in 1 x Per Week/30 Days Discharge Instructions: Apply over primary dressing as directed. Com pression Wrap: Urgo K2 Lite, (equivalent to a 3 layer) two layer compression system, regular 1 x Per Week/30 Days Discharge Instructions: Apply Urgo K2 Lite as directed (alternative to 3 layer compression). WOUND #8: - Lower Leg Wound Laterality: Left, Medial Cleanser: Soap and Water 1 x Per Week/30 Days Discharge Instructions: May shower  and wash wound with dial antibacterial soap and water prior to dressing change. Peri-Wound Care: Sween Lotion (Moisturizing lotion) 1 x Per Week/30 Days Discharge Instructions: Apply moisturizing lotion as directed Prim Dressing: PolyMem Non-Adhesive Dressing, 4x4 in  1 x Per Week/30 Days ary Discharge Instructions: Apply to wound bed as instructed Secondary Dressing: Woven Gauze Sponge, Non-Sterile 4x4 in 1 x Per Week/30 Days Discharge Instructions: Apply over primary dressing as directed. Com pression Wrap: Urgo K2 Lite, (equivalent to a 3 layer) two layer compression system, regular 1 x Per Week/30 Days Discharge Instructions: Apply Urgo K2 Lite as directed (alternative to 3 layer compression). 1. I am going to recommend that we have the patient continue with the PolyMem which I think is doing a really good job here. 2. I am going to recommend as well that the patient should continue to monitor for any signs of infection or worsening. Based on what I am seeing I think that we are making really good headway towards complete closure. Specifically I do believe that the Urgo K2 lite compression wrap has been extremely beneficial. 3. With regard to the skin tear part of it did tacked back down and part of it did not. What is remaining actually looks to be doing really well though and I think that this should continue to heal quite nicely. We will see patient back for reevaluation in 1 week here in the clinic. If anything worsens or changes patient will contact our office for additional recommendations. Electronic Signature(s) Signed: 06/06/2023 4:40:15 PM By: Allen Derry PA-C Entered By: Allen Derry on 06/06/2023 13:40:15 -------------------------------------------------------------------------------- SuperBill Details Patient Name: Date of Service: [name], [name] 06/06/2023 Medical Record Number: [id number] Patient Account Number: [id number] Date of Birth/Sex: Treating RN: 12/18/1932 (89 y.o.  F) Primary Care Provider: [name] Other Clinician: Referring Provider: Treating Provider/Extender: [name] in Treatment: 2 Diagnosis Coding ICD-10 Codes Code Description [id number] Chronic venous hypertension (idiopathic) with ulcer and inflammation of left lower extremity L97.822 Non-pressure chronic ulcer of other part of left lower leg with fat layer exposed L97.822 Non-pressure chronic ulcer of other part of left lower leg with fat layer exposed I10 Essential (primary) hypertension Facility Procedures : [place], [place] JOA4 Code: [id number] RY L ([id number]) Description: [id number] - DEBRIDE WOUND 1ST 20 SQ CM OR < ICD-10 Diagnosis Description L97.822 Non-pressure chronic ulcer of other part of left lower leg with fat layer exposed [phone] Modifier: [id number] Pa Quantity: 1 ge 9 of 9 Physician Procedures : CPT4 Code Description Modifier [id number] - WC PHYS DEBR WO ANESTH 20 SQ CM ICD-10 Diagnosis Description L97.822 Non-pressure chronic ulcer of other part of left lower leg with fat layer exposed Quantity: 1 Electronic Signature(s) Signed: 06/06/2023 4:40:39 PM By: Allen Derry PA-C Entered By: Allen Derry on 06/06/2023 13:40:39

## 2023-06-07 NOTE — Progress Notes (Signed)
CADANCE, SCHUMAN ([id number]) [id number] Page 1 of 8 Visit Report for 06/06/2023 Arrival Information Details Patient Name: Date of Service: [place], [name] 06/06/2023 3:30 PM Medical Record Number: [id number] Patient Account Number: [id number] Date of Birth/Sex: Treating RN: 06-Oct-1933 (89 y.o. F) Primary Care Tierria Watson: [name] Other Clinician: Referring Ladarryl Wrage: Treating Lewin Pellow/Extender: [name] in Treatment: 2 Visit Information History Since Last Visit All ordered tests and consults were completed: No Patient Arrived: Ambulatory Added or deleted any medications: No Arrival Time: 13:48 Any new allergies or adverse reactions: No Accompanied By: self Had a fall or experienced change in No Transfer Assistance: None activities of daily living that may affect Patient Identification Verified: Yes risk of falls: Secondary Verification Process Completed: Yes Signs or symptoms of abuse/neglect since last visito No Patient Requires Transmission-Based Precautions: No Hospitalized since last visit: No Patient Has Alerts: No Implantable device outside of the clinic excluding No cellular tissue based products placed in the center since last visit: Pain Present Now: No Electronic Signature(s) Signed: 06/06/2023 4:44:46 PM By: Dayton Scrape Entered By: Dayton Scrape on 06/06/2023 13:18:18 -------------------------------------------------------------------------------- Compression Therapy Details Patient Name: Date of Service: [place], [name] 06/06/2023 3:30 PM Medical Record Number: [id number] Patient Account Number: [id number] Date of Birth/Sex: Treating RN: Sep 08, 1933 (89 y.o. Fredderick Phenix Primary Care Jahmad Petrich: [name] Other Clinician: Referring Gilberto Streck: Treating Simcha Speir/Extender: [name] in Treatment: 2 Compression Therapy Performed for Wound Assessment: Wound #8  Left,Medial Lower Leg Performed By: Clinician Samuella Bruin, RN Compression Type: Double Layer Post Procedure Diagnosis Same as Pre-procedure Electronic Signature(s) Signed: 06/06/2023 4:49:44 PM By: Gelene Mink By: Samuella Bruin on 06/06/2023 13:38:28 Jenna Luo ([id number]) [id number] Page 2 of 8 -------------------------------------------------------------------------------- Encounter Discharge Information Details Patient Name: Date of Service: [place], [name] 06/06/2023 3:30 PM Medical Record Number: [id number] Patient Account Number: [id number] Date of Birth/Sex: Treating RN: 1932-12-20 (89 y.o. Fredderick Phenix Primary Care Baltazar Pekala: [name] Other Clinician: Referring Audrey Eller: Treating Trease Bremner/Extender: [name] in Treatment: 2 Encounter Discharge Information Items Post Procedure Vitals Discharge Condition: Stable Temperature (F): 98.1 Ambulatory Status: Ambulatory Pulse (bpm): 69 Discharge Destination: Home Respiratory Rate (breaths/min): 18 Transportation: Private Auto Blood Pressure (mmHg): 173/72 Accompanied By: self Schedule Follow-up Appointment: Yes Clinical Summary of Care: Patient Declined Electronic Signature(s) Signed: 06/06/2023 4:49:44 PM By: Samuella Bruin Entered By: Samuella Bruin on 06/06/2023 13:48:38 -------------------------------------------------------------------------------- Lower Extremity Assessment Details Patient Name: Date of Service: [place], [name] 06/06/2023 3:30 PM Medical Record Number: [id number] Patient Account Number: [id number] Date of Birth/Sex: Treating RN: 1933-01-30 (88 y.o. [name] Primary Care Rotunda Worden: [name] Other Clinician: Referring Leoncio Hansen: Treating Avion Patella/Extender: [name] in Treatment: 2 Edema Assessment Assessed: [Left: No] [Right: No] Edema: [Left: N]  [Right: o] Calf Left: Right: Point of Measurement: From Medial Instep 31 cm Ankle Left: Right: Point of Measurement: From Medial Instep 18.2 cm Vascular Assessment Pulses: Dorsalis Pedis Palpable: [Left:Yes] Extremity colors, hair growth, and conditions: Extremity Color: [Left:Hyperpigmented] Hair Growth on Extremity: [Left:Yes] Temperature of Extremity: [Left:Warm] Capillary Refill: [Left:< 3 seconds] Dependent Rubor: [Left:Yes No] Electronic Signature(s) Signed: 06/06/2023 5:37:32 PM By: Karie Schwalbe RN Entered By: Karie Schwalbe on 06/06/2023 14:27:30 Jenna Luo ([id number]) [id number] Page 3 of 8 -------------------------------------------------------------------------------- Multi-Disciplinary Care Plan Details Patient Name: Date of Service: [place], [name] 06/06/2023 3:30 PM Medical Record Number: [id number] Patient  Account Number: [id number] Date of Birth/Sex: Treating RN: 1932/10/24 (89 y.o. Fredderick Phenix Primary Care Shiniqua Groseclose: [name] Other Clinician: Referring Gaspare Netzel: Treating Merideth Bosque/Extender: [name] in Treatment: 2 Multidisciplinary Care Plan reviewed with physician Active Inactive Abuse / Safety / Falls / Self Care Management Nursing Diagnoses: Potential for falls Goals: Patient/caregiver will verbalize/demonstrate measures taken to prevent injury and/or falls Date Initiated: 05/23/2023 Target Resolution Date: 06/20/2023 Goal Status: Active Interventions: Assess fall risk on admission and as needed Notes: Venous Leg Ulcer Nursing Diagnoses: Knowledge deficit related to disease process and management Potential for venous Insuffiency (use before diagnosis confirmed) Goals: Patient will maintain optimal edema control Date Initiated: 05/23/2023 Target Resolution Date: 06/20/2023 Goal Status: Active Interventions: Assess peripheral edema status every visit. Compression as  ordered Treatment Activities: Therapeutic compression applied : 05/23/2023 Notes: Wound/Skin Impairment Nursing Diagnoses: Impaired tissue integrity Knowledge deficit related to ulceration/compromised skin integrity Goals: Patient/caregiver will verbalize understanding of skin care regimen Date Initiated: 05/23/2023 Target Resolution Date: 06/20/2023 Goal Status: Active Ulcer/skin breakdown will have a volume reduction of 30% by week 4 Date Initiated: 05/23/2023 Target Resolution Date: 06/20/2023 Goal Status: Active Interventions: Assess patient/caregiver ability to obtain necessary supplies Assess patient/caregiver ability to perform ulcer/skin care regimen upon admission and as needed Assess ulceration(s) every visit Provide education on ulcer and skin care Treatment Activities: Skin care regimen initiated : 05/23/2023 OVELL, ADKINSON ([id number]) [id number] Page 4 of 8 Topical wound management initiated : 05/23/2023 Notes: Electronic Signature(s) Signed: 06/06/2023 4:49:44 PM By: Samuella Bruin Entered By: Samuella Bruin on 06/06/2023 13:47:18 -------------------------------------------------------------------------------- Pain Assessment Details Patient Name: Date of Service: [place], [name] 06/06/2023 3:30 PM Medical Record Number: [id number] Patient Account Number: [id number] Date of Birth/Sex: Treating RN: 05-25-1933 (89 y.o. F) Primary Care Choya Tornow: [name] Other Clinician: Referring Bathsheba Durrett: Treating Tillmon Kisling/Extender: [name] in Treatment: 2 Active Problems Location of Pain Severity and Description of Pain Patient Has Paino No Site Locations Pain Management and Medication Current Pain Management: Electronic Signature(s) Signed: 06/06/2023 4:44:46 PM By: Dayton Scrape Entered By: Dayton Scrape on 06/06/2023  13:18:55 -------------------------------------------------------------------------------- Patient/Caregiver Education Details Patient Name: Date of Service: [place], [name] 8/21/2024andnbsp3:30 PM Medical Record Number: [id number] Patient Account Number: [id number] Date of Birth/Gender: Treating RN: 28-Nov-1932 (89 y.o. Fredderick Phenix Primary Care Physician: [name] Other Clinician: Referring Physician: Treating Physician/Extender: [name] in Treatment: 2 Education [address] [place], [place] ([id number]) [id number] Page 5 of 8 Education Provided To: Patient Education Topics Provided Wound/Skin Impairment: Methods: Explain/Verbal Responses: Reinforcements needed, State content correctly Electronic Signature(s) Signed: 06/06/2023 4:49:44 PM By: Samuella Bruin Entered By: Samuella Bruin on 06/06/2023 13:47:29 -------------------------------------------------------------------------------- Wound Assessment Details Patient Name: Date of Service: [place], [name] 06/06/2023 3:30 PM Medical Record Number: [id number] Patient Account Number: [id number] Date of Birth/Sex: Treating RN: 1932-12-14 (89 y.o. F) Primary Care Hammond Obeirne: [name] Other Clinician: Referring Denora Wysocki: Treating Shaquita Fort/Extender: [name] in Treatment: 2 Wound Status Wound Number: 7 Primary Venous Leg Ulcer Etiology: Wound Location: Left, Anterior Lower Leg Wound Status: Open Wounding Event: Trauma Comorbid Cataracts, Hypertension, Raynauds, Rheumatoid Arthritis, Date Acquired: 02/23/2023 History: Osteoarthritis Weeks Of Treatment: 2 Clustered Wound: No Photos Wound Measurements Length: (cm) 1.5 Width: (cm) 1 Depth: (cm) 0.1 Area: (cm) 1.178 Volume: (cm) 0.118 % Reduction in Area: 91.7% % Reduction in Volume: 91.7% Epithelialization: Medium (34-66%) Tunneling: No Undermining: No Wound  Description Classification: Full Thickness Without Exposed Suppor Wound Margin: Flat and Intact Exudate Amount: Medium Exudate Type: Serous Exudate Color: amber t Structures Foul Odor After Cleansing: No Slough/Fibrino No Wound Bed Granulation Amount: Large (67-100%) Exposed Structure Granulation Quality: Red, Pink Fascia Exposed: No Necrotic Amount: None Present (0%) Fat Layer (Subcutaneous Tissue) Exposed: Yes Tendon Exposed: No ANOVA, QUIRKE ([id number]) [id number] Page 6 of 8 Muscle Exposed: No Joint Exposed: No Bone Exposed: No Periwound Skin Texture Texture Color No Abnormalities Noted: Yes No Abnormalities Noted: No Hemosiderin Staining: Yes Moisture No Abnormalities Noted: No Temperature / Pain Maceration: Yes Temperature: No Abnormality Tenderness on Palpation: Yes Treatment Notes Wound #7 (Lower Leg) Wound Laterality: Left, Anterior Cleanser Soap and Water Discharge Instruction: May shower and wash wound with dial antibacterial soap and water prior to dressing change. Peri-Wound Care Sween Lotion (Moisturizing lotion) Discharge Instruction: Apply moisturizing lotion as directed Topical Primary Dressing PolyMem Non-Adhesive Dressing, 4x4 in Discharge Instruction: Apply to wound bed as instructed Secondary Dressing Woven Gauze Sponge, Non-Sterile 4x4 in Discharge Instruction: Apply over primary dressing as directed. Secured With Compression Wrap Urgo K2 Lite, (equivalent to a 3 layer) two layer compression system, regular Discharge Instruction: Apply Urgo K2 Lite as directed (alternative to 3 layer compression). Compression Stockings Add-Ons Electronic Signature(s) Signed: 06/06/2023 4:44:46 PM By: Dayton Scrape Entered By: Dayton Scrape on 06/06/2023 13:28:59 -------------------------------------------------------------------------------- Wound Assessment Details Patient Name: Date of Service: [place], [name] 06/06/2023 3:30  PM Medical Record Number: [id number] Patient Account Number: [id number] Date of Birth/Sex: Treating RN: 1933/10/08 (89 y.o. F) Primary Care Shaquitta Burbridge: [name] Other Clinician: Referring Elbie Statzer: Treating Francessca Friis/Extender: [name] in Treatment: 2 Wound Status Wound Number: 8 Primary Trauma, Other Etiology: Wound Location: Left, Medial Lower Leg Wound Status: Open Wounding Event: Trauma Comorbid Cataracts, Hypertension, Raynauds, Rheumatoid Arthritis, Date Acquired: 05/28/2023 History: Osteoarthritis Weeks Of Treatment: 1 Clustered Wound: No Photos DIONICIA, THWAITS ([id number]) [id number] Page 7 of 8 Wound Measurements Length: (cm) 0.5 Width: (cm) 0.4 Depth: (cm) 0.1 Area: (cm) 0.157 Volume: (cm) 0.016 % Reduction in Area: 92.2% % Reduction in Volume: 92% Epithelialization: Small (1-33%) Tunneling: No Undermining: No Wound Description Classification: Full Thickness Without Exposed Suppor Wound Margin: Distinct, outline attached Exudate Amount: Medium Exudate Type: Serosanguineous Exudate Color: red, brown t Structures Foul Odor After Cleansing: No Slough/Fibrino No Wound Bed Granulation Amount: Large (67-100%) Exposed Structure Granulation Quality: Red, Hyper-granulation Fascia Exposed: No Necrotic Amount: None Present (0%) Fat Layer (Subcutaneous Tissue) Exposed: Yes Tendon Exposed: No Muscle Exposed: No Joint Exposed: No Bone Exposed: No Periwound Skin Texture Texture Color No Abnormalities Noted: No No Abnormalities Noted: No Callus: No Atrophie Blanche: No Crepitus: No Cyanosis: No Excoriation: No Ecchymosis: No Induration: No Erythema: No Rash: No Hemosiderin Staining: No Scarring: No Mottled: No Pallor: No Moisture Rubor: No No Abnormalities Noted: No Dry / Scaly: No Maceration: No Treatment Notes Wound #8 (Lower Leg) Wound Laterality: Left, Medial Cleanser Soap and  Water Discharge Instruction: May shower and wash wound with dial antibacterial soap and water prior to dressing change. Peri-Wound Care Sween Lotion (Moisturizing lotion) Discharge Instruction: Apply moisturizing lotion as directed Topical Primary Dressing PolyMem Non-Adhesive Dressing, 4x4 in Discharge Instruction: Apply to wound bed as instructed Secondary Dressing Woven Gauze Sponge, Non-Sterile 4x4 in Discharge Instruction: Apply over primary dressing as directed. Secured With [place], [place] ([id number]) [id number] Page 8 of 8 Compression Wrap Urgo K2 Lite, (equivalent to a 3 layer) two layer compression system,  regular Discharge Instruction: Apply Urgo K2 Lite as directed (alternative to 3 layer compression). Compression Stockings Add-Ons Electronic Signature(s) Signed: 06/06/2023 4:44:46 PM By: Dayton Scrape Entered By: Dayton Scrape on 06/06/2023 13:29:31 -------------------------------------------------------------------------------- Vitals Details Patient Name: Date of Service: [place], [name] 06/06/2023 3:30 PM Medical Record Number: [id number] Patient Account Number: [id number] Date of Birth/Sex: Treating RN: 10/02/1933 (89 y.o. F) Primary Care Cortlan Dolin: [name] Other Clinician: Referring Pennie Vanblarcom: Treating Victoriana Aziz/Extender: [name] in Treatment: 2 Vital Signs Time Taken: 04:18 Temperature (F): 98.1 Height (in): 62 Pulse (bpm): 69 Weight (lbs): 105 Respiratory Rate (breaths/min): 18 Body Mass Index (BMI): 19.2 Blood Pressure (mmHg): 173/72 Reference Range: 80 - 120 mg / dl Electronic Signature(s) Signed: 06/06/2023 4:44:46 PM By: Dayton Scrape Entered By: Dayton Scrape on 06/06/2023 13:18:50

## 2023-06-11 DIAGNOSIS — M1711 Unilateral primary osteoarthritis, right knee: Secondary | ICD-10-CM | POA: Diagnosis not present

## 2023-06-13 ENCOUNTER — Encounter (HOSPITAL_BASED_OUTPATIENT_CLINIC_OR_DEPARTMENT_OTHER): Payer: PPO | Admitting: Internal Medicine

## 2023-06-13 DIAGNOSIS — I87332 Chronic venous hypertension (idiopathic) with ulcer and inflammation of left lower extremity: Secondary | ICD-10-CM | POA: Diagnosis not present

## 2023-06-13 DIAGNOSIS — S81802A Unspecified open wound, left lower leg, initial encounter: Secondary | ICD-10-CM | POA: Diagnosis not present

## 2023-06-14 NOTE — Progress Notes (Signed)
AMAZIAH, SONA ([id number]) [id number] Page 1 of 6 Visit Report for 06/13/2023 HPI Details Patient Name: Date of Service: [name], [name] 06/13/2023 2:00 PM Medical Record Number: [id number] Patient Account Number: [id number] Date of Birth/Sex: Treating RN: 1932-12-30 (89 y.o. F) Primary Care Provider: [name] Other Clinician: Referring Provider: Treating Provider/Extender: Trevor Mace in Treatment: 3 History of Present Illness HPI Description: READMISSION 07/15/2019 Patient is now an 89 year old woman who was previously here in 2016 and 2018. Cared for at the time by Dr. Meyer Russel. Both times with wounds related to trauma in the left leg. She was felt to have underlying venous insufficiency although both occasions were related to trauma. The patient tells me that 2 weeks ago she hit her lateral left leg on the car door. This was a skin tear with a flap for a period of time she has been using Polysporin. The flap came off recently she has a clean looking superficial wound on the left lateral leg. Our intake nurse reported some drainage. Past medical history; includes sensorineural hearing loss, osteoarthritis, hypertension and a hammertoe on the right foot for which she follows with podiatry. ABIs in our clinic were 1.19 on the left 07/21/2019; the patient did not like the wraps. They slid down and rub the wound the wound is measuring larger she is upset. 10/12; left lateral leg wound. Most of this looks healthy even under illumination. We have been using Hydrofera Blue under border foam. She would not allow compression 10/19; left lateral leg wound. 2 small open areas remain of the original wound. We have been using Hydrofera Blue under border foam. This seems to be making decent progress 10/26 left lateral leg traumatic wound. There is no open wound remaining. We have been using Hydrofera Blue under border foam she arrived with  some denuded epithelium that I removed there is no open wound remaining. Is fairly clear she has some degree of chronic venous insufficiency with not a lot of edema but dilated veins in her feet. She reminds me that she also has reactive arthritis and was on prednisone for a prolonged period of time. Nevertheless I think it would be beneficial for her to at least wear support stockings but right now I do not think she is going to agree to the Readmission: 02/18/2020 upon evaluation today patient has sustained a skin tear which occurred about 1 week ago she tells me. Fortunately there does not appear to be any signs of active infection at this time which is excellent news. She has been tolerating the dressing changes without complication. Overall very pleased with where things stand at this point. The only issue that I see here is that the skin flap somewhat folded back and then reattached further down causing an area that is actually bunched up to form where it closed on itself but then reattached on the end of the tissue. Nonetheless I think we have to trim off the bunched up tissue in order to allow this to heal appropriately. That is the only issue I really see today. 03/10/2020 upon evaluation today patient actually appears to be doing excellent at this time. She has healed quite nicely and has been a couple of weeks since I saw her. Overall I feel like she is completely healed and ready for discharge as of today. Readmission: 03-21-2023 upon evaluation today patient appears to be doing somewhat poorly in regard to the left wound ulcer she tells me has been present  for about a month. She tells me that she had this on the metal flatbed shopping carts at [facility] and subsequently has been having issues here with this since she tells me that the original Band-Aid she was using caused this to get worse. With that being said I do not see any evidence of active infection at this time everything seems to  really be doing quite well as far as I am concerned. 03-28-2023 upon evaluation today patient appears to be doing well currently in regard to her wounds which in fact appear to be completely healed. I am very pleased with this. Readmission: 05-23-2023 upon evaluation patient presents for reevaluation here in the clinic concerning issues that she has been having with her left anterior lower extremity. This actually appears to have a significant amount of new skin growth over top of the areas of irritation I am not exactly sure what happened here to be perfectly honest. I explained to the patient that this is kind of an unusual presentation for this to be this irritated and yet not have any significant openings at this time. I do not see any evidence of active infection locally or systemically which is great news. 05-30-2023 upon evaluation today patient appears to be doing well currently in regard to her lower extremity wounds which are actually drying out the may be a little bit too dry. I think PolyMem may be better for her and we discussed that today. Fortunately I do not see any signs of active infection locally or systemically which is great news. 06-06-2023 upon evaluation today patient appears to be doing well currently in regard to her wounds. Both are showing signs of improvement which is great news. Fortunately I do not see any signs of active infection locally or systemically at this time. 8/28; the distal wound on the left leg is healed she still has a comma shaped proximal wound medially. She reminds as she will be leaving for [place] on September night. The remaining wound is on the right upper medial calf a comma shaped wound this may be closed by that time. We had some discussion about compression stockings Electronic Signature(s) Signed: 06/13/2023 5:08:25 PM By: Baltazar Najjar MD Jenna Luo ([id number]) [phone].pdf Page 2 of 6 Entered By: Baltazar Najjar on  06/13/2023 15:42:21 -------------------------------------------------------------------------------- Physical Exam Details Patient Name: Date of Service: [name], [name] [name] 06/13/2023 2:00 PM Medical Record Number: [id number] Patient Account Number: [id number] Date of Birth/Sex: Treating RN: 03/21/1933 (89 y.o. F) Primary Care Provider: [name] Other Clinician: Referring Provider: Treating Provider/Extender: Trevor Mace in Treatment: 3 Constitutional Sitting or standing Blood Pressure is within target range for patient.. Pulse regular and within target range for patient.Marland Kitchen Respirations regular, non-labored and within target range.. Temperature is normal and within the target range for the patient.Marland Kitchen Appears in no distress. Notes Wound exam; the patient has a tiny, shaped wound on the upper medial lower leg. The area on distally is closed. She does not have a lot of edema but very fragile skin with multiple scars.Her pedal pulses are palpable she does not have a lot of edema in her legs Electronic Signature(s) Signed: 06/13/2023 5:08:25 PM By: Baltazar Najjar MD Entered By: Baltazar Najjar on 06/13/2023 15:43:28 -------------------------------------------------------------------------------- Physician Orders Details Patient Name: Date of Service: [name], [name]. 06/13/2023 2:00 PM Medical Record Number: [id number] Patient Account Number: [id number] Date of Birth/Sex: Treating RN: 08-11-1933 (89 y.o. [name] Primary Care Provider: Blair Heys  R Other Clinician: Referring Provider: Treating Provider/Extender: Trevor Mace in Treatment: 3 Verbal / Phone Orders: No Diagnosis Coding Follow-up Appointments ppointment in 1 week. - with [facility] Wednesday 06/20/2023 at 2pm Return A ppointment in 2 weeks. [facility] Wednesday room 9 Return A Return appointment in 3 weeks. [facility] Schwartz Wednesday Bathing/ Shower/ Hygiene May shower  and wash wound with soap and water. Edema Control - Lymphedema / SCD / Other Elevate legs to the level of the heart or above for 30 minutes daily and/or when sitting for 3-4 times a day throughout the day. Avoid standing for long periods of time. Exercise regularly Wound Treatment Wound #8 - Lower Leg Wound Laterality: Left, Medial Cleanser: Soap and Water 1 x Per Week/30 Days Discharge Instructions: May shower and wash wound with dial antibacterial soap and water prior to dressing change. Peri-Wound Care: Sween Lotion (Moisturizing lotion) 1 x Per Week/30 Days Discharge Instructions: Apply moisturizing lotion as directed Prim Dressing: PolyMem Non-Adhesive Dressing, 4x4 in 1 x Per Week/30 Days ary Discharge Instructions: Apply to wound bed as instructed Secondary Dressing: Woven Gauze Sponge, Non-Sterile 4x4 in 1 x Per Week/30 Days COUNTESS, [name] ([id number]) [id number] Page 3 of 6 Discharge Instructions: Apply over primary dressing as directed. Compression Wrap: Urgo K2 Lite, (equivalent to a 3 layer) two layer compression system, regular 1 x Per Week/30 Days Discharge Instructions: Apply Urgo K2 Lite as directed (alternative to 3 layer compression). Electronic Signature(s) Signed: 06/13/2023 5:08:25 PM By: Baltazar Najjar MD Signed: 06/13/2023 6:19:00 PM By: Shawn Stall RN, BSN Entered By: Shawn Stall on 06/13/2023 15:08:14 -------------------------------------------------------------------------------- Problem List Details Patient Name: Date of Service: [name], [name] 06/13/2023 2:00 PM Medical Record Number: [id number] Patient Account Number: [id number] Date of Birth/Sex: Treating RN: 03-Sep-1933 (89 y.o. Debara Pickett, [place] Primary Care Provider: [name] Other Clinician: Referring Provider: Treating Provider/Extender: Trevor Mace in Treatment: 3 Active Problems ICD-10 Encounter Code Description Active Date  MDM Diagnosis I87.332 Chronic venous hypertension (idiopathic) with ulcer and inflammation of left 05/23/2023 No Yes lower extremity L97.822 Non-pressure chronic ulcer of other part of left lower leg with fat layer exposed8/04/2023 No Yes L97.822 Non-pressure chronic ulcer of other part of left lower leg with fat layer exposed8/04/2023 No Yes I10 Essential (primary) hypertension 05/23/2023 No Yes Inactive Problems Resolved Problems Electronic Signature(s) Signed: 06/13/2023 5:08:25 PM By: Baltazar Najjar MD Entered By: Baltazar Najjar on 06/13/2023 15:36:56 -------------------------------------------------------------------------------- Progress Note Details Patient Name: Date of Service: [name], [name] [place]. 06/13/2023 2:00 PM Medical Record Number: [id number] Patient Account Number: [id number] Date of Birth/Sex: Treating RN: 15-Jan-1933 (89 y.o. F) Primary Care Provider: [name] Other Clinician: MYLIN, COMPO ([id number]) [id number] Page 4 of 6 Referring Provider: Treating Provider/Extender: Trevor Mace in Treatment: 3 Subjective History of Present Illness (HPI) READMISSION 07/15/2019 Patient is now an 89 year old woman who was previously here in 2016 and 2018. Cared for at the time by Dr. Meyer Russel. Both times with wounds related to trauma in the left leg. She was felt to have underlying venous insufficiency although both occasions were related to trauma. The patient tells me that 2 weeks ago she hit her lateral left leg on the car door. This was a skin tear with a flap for a period of time she has been using Polysporin. The flap came off recently she has a clean looking superficial wound on the left lateral leg. Our intake nurse reported some drainage.  Past medical history; includes sensorineural hearing loss, osteoarthritis, hypertension and a hammertoe on the right foot for which she follows with podiatry. ABIs in our clinic were  1.19 on the left 07/21/2019; the patient did not like the wraps. They slid down and rub the wound the wound is measuring larger she is upset. 10/12; left lateral leg wound. Most of this looks healthy even under illumination. We have been using Hydrofera Blue under border foam. She would not allow compression 10/19; left lateral leg wound. 2 small open areas remain of the original wound. We have been using Hydrofera Blue under border foam. This seems to be making decent progress 10/26 left lateral leg traumatic wound. There is no open wound remaining. We have been using Hydrofera Blue under border foam she arrived with some denuded epithelium that I removed there is no open wound remaining. Is fairly clear she has some degree of chronic venous insufficiency with not a lot of edema but dilated veins in her feet. She reminds me that she also has reactive arthritis and was on prednisone for a prolonged period of time. Nevertheless I think it would be beneficial for her to at least wear support stockings but right now I do not think she is going to agree to the Readmission: 02/18/2020 upon evaluation today patient has sustained a skin tear which occurred about 1 week ago she tells me. Fortunately there does not appear to be any signs of active infection at this time which is excellent news. She has been tolerating the dressing changes without complication. Overall very pleased with where things stand at this point. The only issue that I see here is that the skin flap somewhat folded back and then reattached further down causing an area that is actually bunched up to form where it closed on itself but then reattached on the end of the tissue. Nonetheless I think we have to trim off the bunched up tissue in order to allow this to heal appropriately. That is the only issue I really see today. 03/10/2020 upon evaluation today patient actually appears to be doing excellent at this time. She has healed quite nicely  and has been a couple of weeks since I saw her. Overall I feel like she is completely healed and ready for discharge as of today. Readmission: 03-21-2023 upon evaluation today patient appears to be doing somewhat poorly in regard to the left wound ulcer she tells me has been present for about a month. She tells me that she had this on the metal flatbed shopping carts at [facility] and subsequently has been having issues here with this since she tells me that the original Band-Aid she was using caused this to get worse. With that being said I do not see any evidence of active infection at this time everything seems to really be doing quite well as far as I am concerned. 03-28-2023 upon evaluation today patient appears to be doing well currently in regard to her wounds which in fact appear to be completely healed. I am very pleased with this. Readmission: 05-23-2023 upon evaluation patient presents for reevaluation here in the clinic concerning issues that she has been having with her left anterior lower extremity. This actually appears to have a significant amount of new skin growth over top of the areas of irritation I am not exactly sure what happened here to be perfectly honest. I explained to the patient that this is kind of an unusual presentation for this to be this irritated  and yet not have any significant openings at this time. I do not see any evidence of active infection locally or systemically which is great news. 05-30-2023 upon evaluation today patient appears to be doing well currently in regard to her lower extremity wounds which are actually drying out the may be a little bit too dry. I think PolyMem may be better for her and we discussed that today. Fortunately I do not see any signs of active infection locally or systemically which is great news. 06-06-2023 upon evaluation today patient appears to be doing well currently in regard to her wounds. Both are showing signs of improvement which is  great news. Fortunately I do not see any signs of active infection locally or systemically at this time. 8/28; the distal wound on the left leg is healed she still has a comma shaped proximal wound medially. She reminds as she will be leaving for [place] on September night. The remaining wound is on the right upper medial calf a comma shaped wound this may be closed by that time. We had some discussion about compression stockings Objective Constitutional Sitting or standing Blood Pressure is within target range for patient.. Pulse regular and within target range for patient.Marland Kitchen Respirations regular, non-labored and within target range.. Temperature is normal and within the target range for the patient.Marland Kitchen Appears in no distress. Vitals Time Taken: 2:20 PM, Height: 62 in, Weight: 105 lbs, BMI: 19.2, Temperature: 97.9 F, Pulse: 56 bpm, Respiratory Rate: 18 breaths/min, Blood Pressure: 131/71 mmHg. General Notes: Wound exam; the patient has a tiny, shaped wound on the upper medial lower leg. The area on distally is closed. She does not have a lot of edema but very fragile skin with multiple scars.Her pedal pulses are palpable she does not have a lot of edema in her legs [name], [name] ([id number]) [id number] Page 5 of 6 Integumentary (Hair, Skin) Wound #7 status is Healed - Epithelialized. Original cause of wound was Trauma. The date acquired was: 02/23/2023. The wound has been in treatment 3 weeks. The wound is located on the Left,Anterior Lower Leg. The wound measures 0cm length x 0cm width x 0cm depth; 0cm^2 area and 0cm^3 volume. There is Fat Layer (Subcutaneous Tissue) exposed. There is no tunneling or undermining noted. There is a medium amount of serous drainage noted. The wound margin is flat and intact. There is large (67-100%) red, pink granulation within the wound bed. There is no necrotic tissue within the wound bed. The periwound skin appearance had no abnormalities  noted for texture. The periwound skin appearance exhibited: Maceration, Hemosiderin Staining. Periwound temperature was noted as No Abnormality. The periwound has tenderness on palpation. Wound #8 status is Open. Original cause of wound was Trauma. The date acquired was: 05/28/2023. The wound has been in treatment 2 weeks. The wound is located on the Left,Medial Lower Leg. The wound measures 0.6cm length x 1cm width x 0.1cm depth; 0.471cm^2 area and 0.047cm^3 volume. There is Fat Layer (Subcutaneous Tissue) exposed. There is no tunneling or undermining noted. There is a medium amount of serosanguineous drainage noted. The wound margin is distinct with the outline attached to the wound base. There is large (67-100%) red, hyper - granulation within the wound bed. There is no necrotic tissue within the wound bed. The periwound skin appearance did not exhibit: Callus, Crepitus, Excoriation, Induration, Rash, Scarring, Dry/Scaly, Maceration, Atrophie Blanche, Cyanosis, Ecchymosis, Hemosiderin Staining, Mottled, Pallor, Rubor, Erythema. 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 There was a Double Layer Compression Therapy Procedure by Shawn Stall, RN. Post procedure Diagnosis Wound #8: Same as Pre-Procedure Plan Follow-up Appointments: Return Appointment in 1 week. - with [facility] Wednesday [facility]/20/2023 at 2pm Return Appointment in 2 weeks. [facility] Wednesday room 9 Return appointment in 3 weeks. [facility] Wednesday Bathing/ Shower/ Hygiene: May shower and wash wound with soap and water. Edema Control - Lymphedema /  SCD / Other: Elevate legs to the level of the heart or above for 30 minutes daily and/or when sitting for 3-4 times a day throughout the day. Avoid standing for long periods of time. Exercise regularly WOUND #8: - Lower Leg Wound Laterality: Left, Medial Cleanser: Soap and Water 1 x Per Week/30 Days Discharge Instructions: May shower and wash wound with dial antibacterial soap and water prior to dressing change. Peri-Wound Care: Sween Lotion (Moisturizing lotion) 1 x Per Week/30 Days Discharge Instructions: Apply moisturizing lotion as directed Prim Dressing: PolyMem Non-Adhesive Dressing, 4x4 in 1 x Per Week/30 Days ary Discharge Instructions: Apply to wound bed as instructed Secondary Dressing: Woven Gauze Sponge, Non-Sterile 4x4 in 1 x Per Week/30 Days Discharge Instructions: Apply over primary dressing as directed. Com pression Wrap: Urgo K2 Lite, (equivalent to a 3 layer) two layer compression system, regular 1 x Per Week/30 Days Discharge Instructions: Apply Urgo K2 Lite as directed (alternative to 3 layer compression). Wound exam; the patient has a remaining, shaped wound on the upper medial calf. This may be closed next time. We continued the polymen dressing with Urgo K2 wraps. She reminds [place] that she will be traveling to [place] in 2 weeks therefore next week will be her last visit. Hopefully this will be closed. She did not seem to interested in any form of stocking although she might agreed to a light compression stocking for the flight Electronic Signature(s) Signed: 06/13/2023 5:08:25 PM By: Baltazar Najjar MD Entered By: Baltazar Najjar on 06/13/2023 15:45:04 Jenna Luo ([id number]) [id number] Page 6 of 6 -------------------------------------------------------------------------------- SuperBill Details Patient Name: Date of Service: [name], [name] 06/13/2023 Medical Record Number: [id number] Patient Account Number: [id number] Date of Birth/Sex:  Treating RN: 1932/11/05 (89 y.o. Debara Pickett, [place] Primary Care Provider: [name] Other Clinician: Referring Provider: Treating Provider/Extender: Trevor Mace in Treatment: 3 Diagnosis Coding ICD-10 Codes Code Description [phone] Chronic venous hypertension (idiopathic) with ulcer and inflammation of left lower extremity L97.822 Non-pressure chronic ulcer of other part of left lower leg with fat layer exposed L97.822 Non-pressure chronic ulcer of other part of left lower leg with fat layer exposed I10 Essential (primary) hypertension Facility Procedures : CPT4 Code: 32440102 Description: (Facility Use Only) 29581LT - APPLY MULTLAY COMPRS LWR LT LEG Modifier: Quantity: 1 Physician Procedures : CPT4 Code Description Modifier [id number] - WC PHYS LEVEL 3 - EST PT ICD-10 Diagnosis Description I87.332 Chronic venous hypertension (idiopathic) with ulcer and inflammation of left lower extremity L97.822 Non-pressure chronic ulcer of other part  of left lower leg with fat layer exposed Quantity: 1 Electronic Signature(s) Signed: 06/13/2023 5:08:25 PM By: Baltazar Najjar MD Entered By: Baltazar Najjar on 06/13/2023 15:45:33

## 2023-06-19 ENCOUNTER — Other Ambulatory Visit: Payer: Self-pay

## 2023-06-19 DIAGNOSIS — R6882 Decreased libido: Secondary | ICD-10-CM

## 2023-06-19 NOTE — Telephone Encounter (Signed)
Med refill request: Testosterone 2% Ointment Last AEX: 11/02/2022 Next AEX: recall placed for 10/2023 Last MMG (if hormonal med): 11/13/2022-birads 2 benign, Cat B Refill authorized: Rx pend.  Last testosterone levels checked on 11/02/2022: WNL.

## 2023-06-20 ENCOUNTER — Encounter (HOSPITAL_BASED_OUTPATIENT_CLINIC_OR_DEPARTMENT_OTHER): Payer: PPO | Attending: Physician Assistant | Admitting: Physician Assistant

## 2023-06-20 DIAGNOSIS — L97822 Non-pressure chronic ulcer of other part of left lower leg with fat layer exposed: Secondary | ICD-10-CM | POA: Diagnosis not present

## 2023-06-20 DIAGNOSIS — M069 Rheumatoid arthritis, unspecified: Secondary | ICD-10-CM | POA: Insufficient documentation

## 2023-06-20 DIAGNOSIS — L89893 Pressure ulcer of other site, stage 3: Secondary | ICD-10-CM | POA: Insufficient documentation

## 2023-06-20 DIAGNOSIS — I10 Essential (primary) hypertension: Secondary | ICD-10-CM | POA: Diagnosis not present

## 2023-06-20 DIAGNOSIS — I872 Venous insufficiency (chronic) (peripheral): Secondary | ICD-10-CM | POA: Insufficient documentation

## 2023-06-20 DIAGNOSIS — S81802A Unspecified open wound, left lower leg, initial encounter: Secondary | ICD-10-CM | POA: Diagnosis not present

## 2023-06-20 DIAGNOSIS — I87332 Chronic venous hypertension (idiopathic) with ulcer and inflammation of left lower extremity: Secondary | ICD-10-CM | POA: Diagnosis not present

## 2023-06-20 DIAGNOSIS — I7300 Raynaud's syndrome without gangrene: Secondary | ICD-10-CM | POA: Diagnosis not present

## 2023-06-20 DIAGNOSIS — H905 Unspecified sensorineural hearing loss: Secondary | ICD-10-CM | POA: Diagnosis not present

## 2023-06-20 DIAGNOSIS — M19071 Primary osteoarthritis, right ankle and foot: Secondary | ICD-10-CM | POA: Insufficient documentation

## 2023-06-20 DIAGNOSIS — M79671 Pain in right foot: Secondary | ICD-10-CM | POA: Diagnosis not present

## 2023-06-20 NOTE — Progress Notes (Signed)
[name], [name] ([id number]) [id number] Page 1 of 7 Visit Report for 06/20/2023 Chief Complaint Document Details Patient Name: Date of Service: [name], [name] 06/20/2023 2:00 PM Medical Record Number: [id number] Patient Account Number: [id number] Date of Birth/Sex: Treating RN: [birth year] (89 y.o. F) Primary Care Provider: [name] Other Clinician: Referring Provider: Treating Provider/Extender: [name] in Treatment: 4 Information Obtained from: Patient Chief Complaint Left LE Ulcer Electronic Signature(s) Signed: 06/20/2023 2:56:57 PM By: Allen Derry PA-C Entered By: Allen Derry on 06/20/2023 11:56:57 -------------------------------------------------------------------------------- HPI Details Patient Name: Date of Service: [name], [name] 06/20/2023 2:00 PM Medical Record Number: [id number] Patient Account Number: [id number] Date of Birth/Sex: Treating RN: Jul 29, 1933 (89 y.o. F) Primary Care Provider: [name] Other Clinician: Referring Provider: Treating Provider/Extender: [name] in Treatment: 4 History of Present Illness HPI Description: READMISSION 07/15/2019 Patient is now an 89 year old woman who was previously here in 2016 and 2018. Cared for at the time by Dr. Meyer Russel. Both times with wounds related to trauma in the left leg. She was felt to have underlying venous insufficiency although both occasions were related to trauma. The patient tells me that 2 weeks ago she hit her lateral left leg on the car door. This was a skin tear with a flap for a period of time she has been using Polysporin. The flap came off recently she has a clean looking superficial wound on the left lateral leg. Our intake nurse reported some drainage. Past medical history; includes sensorineural hearing loss, osteoarthritis, hypertension and a hammertoe on the right foot for which she follows with  podiatry. ABIs in our clinic were 1.19 on the left 07/21/2019; the patient did not like the wraps. They slid down and rub the wound the wound is measuring larger she is upset. 10/12; left lateral leg wound. Most of this looks healthy even under illumination. We have been using Hydrofera Blue under border foam. She would not allow compression 10/19; left lateral leg wound. 2 small open areas remain of the original wound. We have been using Hydrofera Blue under border foam. This seems to be making decent progress 10/26 left lateral leg traumatic wound. There is no open wound remaining. We have been using Hydrofera Blue under border foam she arrived with some denuded epithelium that I removed there is no open wound remaining. Is fairly clear she has some degree of chronic venous insufficiency with not a lot of edema but dilated veins in her feet. She reminds me that she also has reactive arthritis and was on prednisone for a prolonged period of time. Nevertheless I think it would be beneficial for her to at least wear support stockings but right now I do not think she is going to agree to the Readmission: 02/18/2020 upon evaluation today patient has sustained a skin tear which occurred about 1 week ago she tells me. Fortunately there does not appear to be any signs of active infection at this time which is excellent news. She has been tolerating the dressing changes without complication. Overall very pleased with where things stand at this point. The only issue that I see here is that the skin flap somewhat folded back and then reattached further down causing an area that is actually bunched up to form where it closed on itself but then reattached on the end of the tissue. Nonetheless I think we have to trim off the bunched up tissue in order to allow  this to heal appropriately. That is the only issue I really see today. 03/10/2020 upon evaluation today patient actually appears to be doing excellent at  this time. She has healed quite nicely and has been a couple of weeks since [place] ([id number]) [phone].pdf Page 2 of 7 I saw her. Overall I feel like she is completely healed and ready for discharge as of today. Readmission: 03-21-2023 upon evaluation today patient appears to be doing somewhat poorly in regard to the left wound ulcer she tells me has been present for about a month. She tells me that she had this on the metal flatbed shopping carts at [facility] and subsequently has been having issues here with this since she tells me that the original Band-Aid she was using caused this to get worse. With that being said I do not see any evidence of active infection at this time everything seems to really be doing quite well as far as I am concerned. 03-28-2023 upon evaluation today patient appears to be doing well currently in regard to her wounds which in fact appear to be completely healed. I am very pleased with this. Readmission: 05-23-2023 upon evaluation patient presents for reevaluation here in the clinic concerning issues that she has been having with her left anterior lower extremity. This actually appears to have a significant amount of new skin growth over top of the areas of irritation I am not exactly sure what happened here to be perfectly honest. I explained to the patient that this is kind of an unusual presentation for this to be this irritated and yet not have any significant openings at this time. I do not see any evidence of active infection locally or systemically which is great news. 05-30-2023 upon evaluation today patient appears to be doing well currently in regard to her lower extremity wounds which are actually drying out the may be a little bit too dry. I think PolyMem may be better for her and we discussed that today. Fortunately I do not see any signs of active infection locally or systemically which is great news. 06-06-2023 upon evaluation  today patient appears to be doing well currently in regard to her wounds. Both are showing signs of improvement which is great news. Fortunately I do not see any signs of active infection locally or systemically at this time. 8/28; the distal wound on the left leg is healed she still has a comma shaped proximal wound medially. She reminds as she will be leaving for [place] on September night. The remaining wound is on the right upper medial calf a comma shaped wound this may be closed by that time. We had some discussion about compression stockings 06-20-2023 upon evaluation today patient appears to be doing well currently in regard to her wounds in fact most everything appears to be about healed although she does have some dry skin over top of the distal left lateral leg. The proximal medial leg is very close to closure and seems to be doing better. Electronic Signature(s) Signed: 06/20/2023 6:03:47 PM By: Allen Derry PA-C Entered By: Allen Derry on 06/20/2023 15:03:47 -------------------------------------------------------------------------------- Physical Exam Details Patient Name: Date of Service: [name], [name] 06/20/2023 2:00 PM Medical Record Number: [id number] Patient Account Number: [id number] Date of Birth/Sex: Treating RN: 16-Nov-1932 (89 y.o. F) Primary Care Provider: [name] Other Clinician: Referring Provider: Treating Provider/Extender: [name] in Treatment: 4 Constitutional Well-nourished and well-hydrated in no acute distress. Respiratory normal breathing without  difficulty. Psychiatric this patient is able to make decisions and demonstrates good insight into disease process. Alert and Oriented x 3. pleasant and cooperative. Notes Patient did not require any sharp debridement today which was good news and overall she seems to be making really good progress here. I am extremely pleased with where we stand I do not see anything open on the  lower leg and in regard to the proximal leg that there is a wound that is still open but again this does not appear to be very open at all it is very close to closure. Electronic Signature(s) Signed: 06/20/2023 6:04:14 PM By: Allen Derry PA-C Entered By: Allen Derry on 06/20/2023 15:04:14 Jenna Luo ([id number]) [id number] Page 3 of 7 -------------------------------------------------------------------------------- Physician Orders Details Patient Name: Date of Service: [place], [name] 06/20/2023 2:00 PM Medical Record Number: [id number] Patient Account Number: [id number] Date of Birth/Sex: Treating RN: 12-Jul-1933 (89 y.o. [name] Primary Care Provider: [name] Other Clinician: Referring Provider: Treating Provider/Extender: [name] in Treatment: 4 Verbal / Phone Orders: No Diagnosis Coding ICD-10 Coding Code Description [phone] Chronic venous hypertension (idiopathic) with ulcer and inflammation of left lower extremity L97.822 Non-pressure chronic ulcer of other part of left lower leg with fat layer exposed L97.822 Non-pressure chronic ulcer of other part of left lower leg with fat layer exposed I10 Essential (primary) hypertension Follow-up Appointments ppointment in 1 week. - with [facility] Wednesday 07/11/2023 at 1:15pm Return A ppointment in 2 weeks. [facility] Wednesday Room 8 Return A Return appointment in 3 weeks. [facility] Wednesday Bathing/ Shower/ Hygiene May shower and wash wound with soap and water. Edema Control - Lymphedema / SCD / Other Elevate legs to the level of the heart or above for 30 minutes daily and/or when sitting for 3-4 times a day throughout the day. Avoid standing for long periods of time. Exercise regularly Additional Orders / Instructions Other: - Twice a day-Use Aquaphor over lower left leg. Then place Tubigrip over gauze which covered the lotioned left lower leg. Wound  Treatment Wound #8 - Lower Leg Wound Laterality: Left, Medial Cleanser: Soap and Water 1 x Per Day/30 Days Discharge Instructions: May shower and wash wound with dial antibacterial soap and water prior to dressing change. Peri-Wound Care: Sween Lotion (Moisturizing lotion) 1 x Per Day/30 Days Discharge Instructions: Apply moisturizing lotion as directed Prim Dressing: PolyMem Non-Adhesive Dressing, 4x4 in 1 x Per Day/30 Days ary Discharge Instructions: Apply to wound bed as instructed Secondary Dressing: Woven Gauze Sponge, Non-Sterile 4x4 in 1 x Per Day/30 Days Discharge Instructions: Apply over primary dressing as directed. Secured With: Tubigrip 1 x Per Day/30 Days Discharge Instructions: slip over left leg lower and over Polymem ( where wound bed is)) Compression Wrap: Urgo K2 Lite, (equivalent to a 3 layer) two layer compression system, regular 1 x Per Day/30 Days Discharge Instructions: On Hold (06/20/23) Apply Urgo K2 Lite as directed (alternative to 3 layer compression). Electronic Signature(s) Signed: 06/20/2023 4:01:32 PM By: Karie Schwalbe RN Signed: 06/20/2023 6:11:34 PM By: Allen Derry PA-C Entered By: Karie Schwalbe on 06/20/2023 12:13:41 -------------------------------------------------------------------------------- Problem List Details Patient Name: Date of Service: [place], [name] 06/20/2023 2:00 PM Medical Record Number: [id number] Patient Account Number: [id number] Date of Birth/Sex: Treating RN: July 13, 1933 (89 y.o. Cammie Knott, Archer L ([id number]) [id number] Page 4 of 7 Primary Care Provider: [name] Other Clinician: Referring Provider: Treating Provider/Extender: Madelyn Flavors, [name] in  Treatment: 4 Active Problems ICD-10 Encounter Code Description Active Date MDM Diagnosis I87.332 Chronic venous hypertension (idiopathic) with ulcer and inflammation of left 05/23/2023 No Yes lower extremity L97.822 Non-pressure  chronic ulcer of other part of left lower leg with fat layer exposed8/04/2023 No Yes L97.822 Non-pressure chronic ulcer of other part of left lower leg with fat layer exposed8/04/2023 No Yes I10 Essential (primary) hypertension 05/23/2023 No Yes Inactive Problems Resolved Problems Electronic Signature(s) Signed: 06/20/2023 2:56:36 PM By: Allen Derry PA-C Entered By: Allen Derry on 06/20/2023 11:56:35 -------------------------------------------------------------------------------- Progress Note Details Patient Name: Date of Service: [name], [name] 06/20/2023 2:00 PM Medical Record Number: [id number] Patient Account Number: [id number] Date of Birth/Sex: Treating RN: [birth year] (89 y.o. F) Primary Care Provider: [name] Other Clinician: Referring Provider: Treating Provider/Extender: [name] in Treatment: 4 Subjective Chief Complaint Information obtained from Patient Left LE Ulcer History of Present Illness (HPI) READMISSION 07/15/2019 Patient is now an 89 year old woman who was previously here in 2016 and 2018. Cared for at the time by Dr. Meyer Russel. Both times with wounds related to trauma in the left leg. She was felt to have underlying venous insufficiency although both occasions were related to trauma. The patient tells me that 2 weeks ago she hit her lateral left leg on the car door. This was a skin tear with a flap for a period of time she has been using Polysporin. The flap came off recently she has a clean looking superficial wound on the left lateral leg. Our intake nurse reported some drainage. Past medical history; includes sensorineural hearing loss, osteoarthritis, hypertension and a hammertoe on the right foot for which she follows with podiatry. ABIs in our clinic were 1.19 on the left 07/21/2019; the patient did not like the wraps. They slid down and rub the wound the wound is measuring larger she is upset. 10/12; left lateral leg wound.  Most of this looks healthy even under illumination. We have been using Hydrofera Blue under border foam. She would not allow compression 10/19; left lateral leg wound. 2 small open areas remain of the original wound. We have been using Hydrofera Blue under border foam. This seems to be making decent progress 10/26 left lateral leg traumatic wound. There is no open wound remaining. We have been using Hydrofera Blue under border foam she arrived with some TALULLAH, TORRY ([id number]) [id number] Page 5 of 7 denuded epithelium that I removed there is no open wound remaining. Is fairly clear she has some degree of chronic venous insufficiency with not a lot of edema but dilated veins in her feet. She reminds me that she also has reactive arthritis and was on prednisone for a prolonged period of time. Nevertheless I think it would be beneficial for her to at least wear support stockings but right now I do not think she is going to agree to the Readmission: 02/18/2020 upon evaluation today patient has sustained a skin tear which occurred about 1 week ago she tells me. Fortunately there does not appear to be any signs of active infection at this time which is excellent news. She has been tolerating the dressing changes without complication. Overall very pleased with where things stand at this point. The only issue that I see here is that the skin flap somewhat folded back and then reattached further down causing an area that is actually bunched up to form where it closed on itself but then reattached on the end of the tissue.  Nonetheless I think we have to trim off the bunched up tissue in order to allow this to heal appropriately. That is the only issue I really see today. 03/10/2020 upon evaluation today patient actually appears to be doing excellent at this time. She has healed quite nicely and has been a couple of weeks since I saw her. Overall I feel like she is completely healed  and ready for discharge as of today. Readmission: 03-21-2023 upon evaluation today patient appears to be doing somewhat poorly in regard to the left wound ulcer she tells me has been present for about a month. She tells me that she had this on the metal flatbed shopping carts at [facility] and subsequently has been having issues here with this since she tells me that the original Band-Aid she was using caused this to get worse. With that being said I do not see any evidence of active infection at this time everything seems to really be doing quite well as far as I am concerned. 03-28-2023 upon evaluation today patient appears to be doing well currently in regard to her wounds which in fact appear to be completely healed. I am very pleased with this. Readmission: 05-23-2023 upon evaluation patient presents for reevaluation here in the clinic concerning issues that she has been having with her left anterior lower extremity. This actually appears to have a significant amount of new skin growth over top of the areas of irritation I am not exactly sure what happened here to be perfectly honest. I explained to the patient that this is kind of an unusual presentation for this to be this irritated and yet not have any significant openings at this time. I do not see any evidence of active infection locally or systemically which is great news. 05-30-2023 upon evaluation today patient appears to be doing well currently in regard to her lower extremity wounds which are actually drying out the may be a little bit too dry. I think PolyMem may be better for her and we discussed that today. Fortunately I do not see any signs of active infection locally or systemically which is great news. 06-06-2023 upon evaluation today patient appears to be doing well currently in regard to her wounds. Both are showing signs of improvement which is great news. Fortunately I do not see any signs of active infection locally or systemically at  this time. 8/28; the distal wound on the left leg is healed she still has a comma shaped proximal wound medially. She reminds as she will be leaving for [place] on September night. The remaining wound is on the right upper medial calf a comma shaped wound this may be closed by that time. We had some discussion about compression stockings 06-20-2023 upon evaluation today patient appears to be doing well currently in regard to her wounds in fact most everything appears to be about healed although she does have some dry skin over top of the distal left lateral leg. The proximal medial leg is very close to closure and seems to be doing better. Objective Constitutional Well-nourished and well-hydrated in no acute distress. Vitals Time Taken: 2:05 PM, Height: 62 in, Weight: 105 lbs, BMI: 19.2, Temperature: 98.3 F, Pulse: 58 bpm, Respiratory Rate: 18 breaths/min, Blood Pressure: 118/53 mmHg. Respiratory normal breathing without difficulty. Psychiatric this patient is able to make decisions and demonstrates good insight into disease process. Alert and Oriented x 3. pleasant and cooperative. General Notes: Patient did not require any sharp debridement today which was good  news and overall she seems to be making really good progress here. I am extremely pleased with where we stand I do not see anything open on the lower leg and in regard to the proximal leg that there is a wound that is still open but again this does not appear to be very open at all it is very close to closure. Integumentary (Hair, Skin) Wound #8 status is Open. Original cause of wound was Trauma. The date acquired was: 05/28/2023. The wound has been in treatment 3 weeks. The wound is located on the Left,Medial Lower Leg. The wound measures 0.4cm length x 0.4cm width x 0.1cm depth; 0.126cm^2 area and 0.013cm^3 volume. There is Fat Layer (Subcutaneous Tissue) exposed. There is no tunneling or undermining noted. There is a medium amount of  serosanguineous drainage noted. The wound margin is distinct with the outline attached to the wound base. There is large (67-100%) red granulation within the wound bed. There is no necrotic tissue within the wound bed. The periwound skin appearance did not exhibit: Callus, Crepitus, Excoriation, Induration, Rash, Scarring, Dry/Scaly, Maceration, Atrophie Blanche, Cyanosis, Ecchymosis, Hemosiderin Staining, Mottled, Pallor, Rubor, Erythema. Assessment Active Problems ICD-10 ZIONAH, VERTUCCI ([id number]) [id number] Page 6 of 7 Chronic venous hypertension (idiopathic) with ulcer and inflammation of left lower extremity Non-pressure chronic ulcer of other part of left lower leg with fat layer exposed Non-pressure chronic ulcer of other part of left lower leg with fat layer exposed Essential (primary) hypertension Plan Follow-up Appointments: Return Appointment in 1 week. - with [facility] Wednesday 07/11/2023 at 1:15pm Return Appointment in 2 weeks. [facility] Wednesday Room 8 Return appointment in 3 weeks. [facility] Wednesday Bathing/ Shower/ Hygiene: May shower and wash wound with soap and water. Edema Control - Lymphedema / SCD / Other: Elevate legs to the level of the heart or above for 30 minutes daily and/or when sitting for 3-4 times a day throughout the day. Avoid standing for long periods of time. Exercise regularly Additional Orders / Instructions: Other: - Twice a day-Use Aquaphor over lower left leg. Then place Tubigrip over gauze which covered the lotioned left lower leg. WOUND #8: - Lower Leg Wound Laterality: Left, Medial Cleanser: Soap and Water 1 x Per Day/30 Days Discharge Instructions: May shower and wash wound with dial antibacterial soap and water prior to dressing change. Peri-Wound Care: Sween Lotion (Moisturizing lotion) 1 x Per Day/30 Days Discharge Instructions: Apply moisturizing lotion as directed Prim Dressing: PolyMem Non-Adhesive Dressing, 4x4 in 1  x Per Day/30 Days ary Discharge Instructions: Apply to wound bed as instructed Secondary Dressing: Woven Gauze Sponge, Non-Sterile 4x4 in 1 x Per Day/30 Days Discharge Instructions: Apply over primary dressing as directed. Secured With: Tubigrip 1 x Per Day/30 Days Discharge Instructions: slip over left leg lower and over Polymem ( where wound bed is)) Com pression Wrap: Urgo K2 Lite, (equivalent to a 3 layer) two layer compression system, regular 1 x Per Day/30 Days Discharge Instructions: On Hold (06/20/23) Apply Urgo K2 Lite as directed (alternative to 3 layer compression). 1. The patient is actually getting ready to go on a 2-week trip and I will see her following when she returns. With that being said in the interim I have her use the Aquaphor on her leg where it is healed to keep it moist. Will use this to dry gauze and Tubigrip over top. 2. With regard to the wound that is further up on her leg we will go to utilize the PolyMem as well  as some roll gauze to secure in place and then the Tubigrip. 3. I would recommend she elevate her legs when she is resting and back in the hotel. But of course I know she is gena be doing a lot of walking as well which is why I think the Tubigrip is key. We will see patient back for reevaluation in 1 week here in the clinic. If anything worsens or changes patient will contact our office for additional recommendations. Electronic Signature(s) Signed: 06/20/2023 6:04:53 PM By: Allen Derry PA-C Entered By: Allen Derry on 06/20/2023 15:04:53 -------------------------------------------------------------------------------- SuperBill Details Patient Name: Date of Service: [name], [name] 06/20/2023 Medical Record Number: [id number] Patient Account Number: [id number] Date of Birth/Sex: Treating RN: December 23, 1932 (89 y.o. [name] Primary Care Provider: [name] Other Clinician: Referring Provider: Treating Provider/Extender: [name] in Treatment: 4 Diagnosis Coding ICD-10 Codes Code Description [phone] Chronic venous hypertension (idiopathic) with ulcer and inflammation of left lower extremity L97.822 Non-pressure chronic ulcer of other part of left lower leg with fat layer exposed L97.822 Non-pressure chronic ulcer of other part of left lower leg with fat layer exposed I10 Essential (primary) hypertension SYBEL, KOSTIUK ([id number]) [id number] Page 7 of 7 Facility Procedures : CPT4 Code: [id number] Description: 99213 - WOUND CARE VISIT-LEV 3 EST PT Modifier: Quantity: 1 Physician Procedures : CPT4 Code Description Modifier [id number] 99213 - WC PHYS LEVEL 3 - EST PT ICD-10 Diagnosis Description I87.332 Chronic venous hypertension (idiopathic) with ulcer and inflammation of left lower extremity L97.822 Non-pressure chronic ulcer of other part  of left lower leg with fat layer exposed I10 Essential (primary) hypertension Quantity: 1 Electronic Signature(s) Signed: 06/20/2023 6:08:54 PM By: Allen Derry PA-C Previous Signature: 06/20/2023 4:01:32 PM Version By: Karie Schwalbe RN Entered By: Allen Derry on 06/20/2023 15:08:54

## 2023-06-21 MED ORDER — NONFORMULARY OR COMPOUNDED ITEM: 0 refills | Status: DC

## 2023-06-27 NOTE — Progress Notes (Signed)
MIDNA, KORF ([id number]) [id number] Page 1 of 8 Visit Report for 06/20/2023 Arrival Information Details Patient Name: Date of Service: [place], [name] 06/20/2023 2:00 PM Medical Record Number: [id number] Patient Account Number: [id number] Date of Birth/Sex: Treating RN: 1933-02-23 (89 y.o. F) Primary Care [employer] Belay: [name] Other Clinician: Referring Prudie Guthridge: Treating Railee Bonillas/Extender: [name] in Treatment: 4 Visit Information History Since Last Visit Added or deleted any medications: No Patient Arrived: Ambulatory Any new allergies or adverse reactions: No Arrival Time: 13:59 Had a fall or experienced change in No Accompanied By: self activities of daily living that may affect Transfer Assistance: None risk of falls: Patient Identification Verified: Yes Signs or symptoms of abuse/neglect since last visito No Secondary Verification Process Completed: Yes Hospitalized since last visit: No Patient Requires Transmission-Based Precautions: No Implantable device outside of the clinic excluding No Patient Has Alerts: No cellular tissue based products placed in the center since last visit: Has Dressing in Place as Prescribed: Yes Has Compression in Place as Prescribed: Yes Pain Present Now: No Electronic Signature(s) Signed: 06/27/2023 4:44:01 PM By: Thayer Dallas Entered By: Thayer Dallas on 06/20/2023 14:03:51 -------------------------------------------------------------------------------- Clinic Level of Care Assessment Details Patient Name: Date of Service: [place], [name] 06/20/2023 2:00 PM Medical Record Number: [id number] Patient Account Number: [id number] Date of Birth/Sex: Treating RN: [birth year] (89 y.o. [name] Primary Care Maui Ahart: [name] Other Clinician: Referring Kapena Hamme: Treating Susen Haskew/Extender: [name] in Treatment: 4 Clinic Level of Care  Assessment Items TOOL 4 Quantity Score X- 1 0 Use when only an EandM is performed on FOLLOW-UP visit ASSESSMENTS - Nursing Assessment / Reassessment X- 1 10 Reassessment of Co-morbidities (includes updates in patient status) X- 1 5 Reassessment of Adherence to Treatment Plan ASSESSMENTS - Wound and Skin A ssessment / Reassessment X - Simple Wound Assessment / Reassessment - one wound 1 5 []  - 0 Complex Wound Assessment / Reassessment - multiple wounds []  - 0 Dermatologic / Skin Assessment (not related to wound area) ASSESSMENTS - Focused Assessment []  - 0 Circumferential Edema Measurements - multi extremities []  - 0 Nutritional Assessment / Counseling / Intervention [name], [name] ([id number]) [id number] Page 2 of 8 []  - 0 Lower Extremity Assessment (monofilament, tuning fork, pulses) []  - 0 Peripheral Arterial Disease Assessment (using hand held doppler) ASSESSMENTS - Ostomy and/or Continence Assessment and Care []  - 0 Incontinence Assessment and Management []  - 0 Ostomy Care Assessment and Management (repouching, etc.) PROCESS - Coordination of Care X - Simple Patient / Family Education for ongoing care 1 15 []  - 0 Complex (extensive) Patient / Family Education for ongoing care X- 1 10 Staff obtains Chiropractor, Records, T Results / Process Orders est X- 1 10 Staff telephones HHA, Nursing Homes / Clarify orders / etc []  - 0 Routine Transfer to another Facility (non-emergent condition) []  - 0 Routine Hospital Admission (non-emergent condition) []  - 0 New Admissions / Manufacturing engineer / Ordering NPWT Apligraf, etc. , []  - 0 Emergency Hospital Admission (emergent condition) X- 1 10 Simple Discharge Coordination []  - 0 Complex (extensive) Discharge Coordination PROCESS - Special Needs []  - 0 Pediatric / Minor Patient Management []  - 0 Isolation Patient Management []  - 0 Hearing / Language / Visual special needs []  - 0 Assessment  of Community assistance (transportation, D/C planning, etc.) []  - 0 Additional assistance / Altered mentation []  - 0 Support Surface(s) Assessment (bed, cushion, seat, etc.) INTERVENTIONS -  Wound Cleansing / Measurement X - Simple Wound Cleansing - one wound 1 5 []  - 0 Complex Wound Cleansing - multiple wounds X- 1 5 Wound Imaging (photographs - any number of wounds) []  - 0 Wound Tracing (instead of photographs) X- 1 5 Simple Wound Measurement - one wound []  - 0 Complex Wound Measurement - multiple wounds INTERVENTIONS - Wound Dressings X - Small Wound Dressing one or multiple wounds 1 10 []  - 0 Medium Wound Dressing one or multiple wounds []  - 0 Large Wound Dressing one or multiple wounds []  - 0 Application of Medications - topical []  - 0 Application of Medications - injection INTERVENTIONS - Miscellaneous []  - 0 External ear exam []  - 0 Specimen Collection (cultures, biopsies, blood, body fluids, etc.) []  - 0 Specimen(s) / Culture(s) sent or taken to Lab for analysis []  - 0 Patient Transfer (multiple staff / Nurse, adult / Similar devices) []  - 0 Simple Staple / Suture removal (25 or less) []  - 0 Complex Staple / Suture removal (26 or more) []  - 0 Hypo / Hyperglycemic Management (close monitor of Blood Glucose) [name], [name] ([id number]) [id number] Page 3 of 8 []  - 0 Ankle / Brachial Index (ABI) - do not check if billed separately X- 1 5 Vital Signs Has the patient been seen at the hospital within the last three years: Yes Total Score: 95 Level Of Care: New/Established - Level 3 Electronic Signature(s) Signed: 06/20/2023 4:01:32 PM By: Karie Schwalbe RN Entered By: Karie Schwalbe on 06/20/2023 15:59:43 -------------------------------------------------------------------------------- Encounter Discharge Information Details Patient Name: Date of Service: [place], [name] 06/20/2023 2:00 PM Medical Record Number: [id number] Patient Account  Number: [id number] Date of Birth/Sex: Treating RN: 21-Dec-1932 (89 y.o. [name] Primary Care Ronnette Rump: [name] Other Clinician: Referring Brennden Masten: Treating Edrick Whitehorn/Extender: [name] in Treatment: 4 Encounter Discharge Information Items Discharge Condition: Stable Ambulatory Status: Ambulatory Discharge Destination: Home Transportation: Private Auto Accompanied By: self Schedule Follow-up Appointment: Yes Clinical Summary of Care: Patient Declined Electronic Signature(s) Signed: 06/20/2023 4:01:32 PM By: Karie Schwalbe RN Entered By: Karie Schwalbe on 06/20/2023 16:00:50 -------------------------------------------------------------------------------- Lower Extremity Assessment Details Patient Name: Date of Service: [place], [name] 06/20/2023 2:00 PM Medical Record Number: [id number] Patient Account Number: [id number] Date of Birth/Sex: Treating RN: 11/08/1932 (89 y.o. F) Primary Care Timothea Bodenheimer: [name] Other Clinician: Referring Akul Leggette: Treating Azriella Mattia/Extender: [name] in Treatment: 4 Edema Assessment Assessed: [Left: No] [Right: No] Edema: [Left: N] [Right: o] Calf Left: Right: Point of Measurement: From Medial Instep 28.5 cm Ankle Left: Right: Point of Measurement: From Medial Instep 17 cm Vascular Assessment Left: [id number] Page 4 of 8Right:] Extremity colors, hair growth, and conditions: Extremity Color: [[id number] Page 4 of 8Hyperpigmented] Hair Growth on Extremity: [id number] Page 4 of 8Yes] Temperature of Extremity: [[id number] Page 4 of 8Warm] Capillary Refill: [id number] Page 4 of 8< 3 seconds] Dependent Rubor: [[id number] Page 4 of 8Yes No] Electronic Signature(s) Signed: 06/27/2023 4:44:01 PM By: Thayer Dallas Entered By: Thayer Dallas on 06/20/2023 14:07:19 -------------------------------------------------------------------------------- Multi-Disciplinary Care Plan Details Patient Name: Date of Service: [place], [name] 06/20/2023 2:00 PM Medical Record Number: [id number] Patient Account Number: [id number] Date of Birth/Sex: Treating RN: 10-01-1933 (89 y.o. [name] Primary Care Arleta Ostrum: [name] Other Clinician: Referring Jaxyn Mestas: Treating Zerrick Hanssen/Extender: [name] in Treatment: 4 Multidisciplinary Care Plan reviewed with physician Active Inactive Abuse / Safety / Falls /  Self Care Management Nursing Diagnoses: Potential for falls Goals: Patient/caregiver will verbalize/demonstrate measures taken to prevent injury and/or falls Date Initiated: 05/23/2023 Target Resolution Date: 08/20/2023 Goal Status: Active Interventions: Assess fall risk on admission and as needed Notes: Venous Leg Ulcer Nursing Diagnoses: Knowledge deficit related to disease process and management Potential for venous Insuffiency (use before diagnosis confirmed) Goals: Patient will maintain optimal edema control Date Initiated: 05/23/2023 Target Resolution Date: 08/20/2023 Goal Status: Active Interventions: Assess peripheral edema status every visit. Compression as ordered Treatment Activities: Therapeutic compression applied : 05/23/2023 Notes: Wound/Skin Impairment Nursing Diagnoses: Impaired tissue integrity Knowledge deficit related to ulceration/compromised skin integrity [name], [name] ([id number]) [id number] Page 5 of 8 Goals: Patient/caregiver will verbalize understanding of skin care regimen Date Initiated: 05/23/2023 Target Resolution Date: 08/20/2023 Goal Status: Active Ulcer/skin breakdown will have a volume reduction of 30% by week 4 Date Initiated: 05/23/2023 Target Resolution Date: 08/20/2023 Goal Status:  Active Interventions: Assess patient/caregiver ability to obtain necessary supplies Assess patient/caregiver ability to perform ulcer/skin care regimen upon admission and as needed Assess ulceration(s) every visit Provide education on ulcer and skin care Treatment Activities: Skin care regimen initiated : 05/23/2023 Topical wound management initiated : 05/23/2023 Notes: Electronic Signature(s) Signed: 06/20/2023 4:01:32 PM By: Karie Schwalbe RN Entered By: Karie Schwalbe on 06/20/2023 15:58:32 -------------------------------------------------------------------------------- Pain Assessment Details Patient Name: Date of Service: [place], [name] 06/20/2023 2:00 PM Medical Record Number: [id number] Patient Account Number: [id number] Date of Birth/Sex: Treating RN: 02/26/1933 (89 y.o. F) Primary Care Lyanna Blystone: [name] Other Clinician: Referring Orland Visconti: Treating Teon Hudnall/Extender: [name] in Treatment: 4 Active Problems Location of Pain Severity and Description of Pain Patient Has Paino No Site Locations Pain Management and Medication Current Pain Management: Electronic Signature(s) Signed: 06/27/2023 4:44:01 PM By: Thayer Dallas Entered By: Thayer Dallas on 06/20/2023 14:04:37 [name] ([id number]) [id number] Page 6 of 8 -------------------------------------------------------------------------------- Patient/Caregiver Education Details Patient Name: Date of Service: [name] 9/4/2024andnbsp2:00 PM Medical Record Number: [id number] Patient Account Number: [id number] Date of Birth/Gender: Treating RN: May 04, 1933 (89 y.o. [name] Primary Care Physician: [name] Other Clinician: Referring Physician: Treating Physician/Extender: [name] in Treatment: 4 Education Assessment Education Provided To: Patient Education Topics Provided Wound/Skin  Impairment: Methods: Explain/Verbal Responses: Return demonstration correctly Electronic Signature(s) Signed: 06/20/2023 4:01:32 PM By: Karie Schwalbe RN Entered By: Karie Schwalbe on 06/20/2023 15:58:50 -------------------------------------------------------------------------------- Wound Assessment Details Patient Name: Date of Service: [place], [name] 06/20/2023 2:00 PM Medical Record Number: [id number] Patient Account Number: [id number] Date of Birth/Sex: Treating RN: 12-Jun-1933 (89 y.o. F) Primary Care Alem Fahl: [name] Other Clinician: Referring Alixandra Alfieri: Treating Jamaree Hosier/Extender: [name] in Treatment: 4 Wound Status Wound Number: 8 Primary Trauma, Other Etiology: Wound Location: Left, Medial Lower Leg Wound Status: Open Wounding Event: Trauma Comorbid Cataracts, Hypertension, Raynauds, Rheumatoid Arthritis, Date Acquired: 05/28/2023 History: Osteoarthritis Weeks Of Treatment: 3 Clustered Wound: No Photos Wound Measurements Length: (cm) 0.4 Width: (cm) 0.4 Barro, Nakenya [name] ([id number]) Depth: (cm) 0.1 Area: (cm) 0.126 Volume: (cm) 0.013 % Reduction in Area: 93.7% % Reduction in Volume: 93.5% [id number] Page 7 of 8 Epithelialization: Small (1-33%) Tunneling: No Undermining: No Wound Description Classification: Full Thickness Without Exposed Suppor Wound Margin: Distinct, outline attached Exudate Amount: Medium Exudate Type: Serosanguineous Exudate Color: red, brown t Structures Foul Odor After Cleansing: No Slough/Fibrino No Wound Bed Granulation Amount: Large (67-100%) Exposed Structure Granulation Quality: Red Fascia  Exposed: No Necrotic Amount: None Present (0%) Fat Layer (Subcutaneous Tissue) Exposed: Yes Tendon Exposed: No Muscle Exposed: No Joint Exposed: No Bone Exposed: No Periwound Skin Texture Texture Color No Abnormalities Noted: No No Abnormalities Noted: No Callus:  No Atrophie Blanche: No Crepitus: No Cyanosis: No Excoriation: No Ecchymosis: No Induration: No Erythema: No Rash: No Hemosiderin Staining: No Scarring: No Mottled: No Pallor: No Moisture Rubor: No No Abnormalities Noted: No Dry / Scaly: No Maceration: No Treatment Notes Wound #8 (Lower Leg) Wound Laterality: Left, Medial Cleanser Soap and Water Discharge Instruction: May shower and wash wound with dial antibacterial soap and water prior to dressing change. Peri-Wound Care Sween Lotion (Moisturizing lotion) Discharge Instruction: Apply moisturizing lotion as directed Topical Primary Dressing PolyMem Non-Adhesive Dressing, 4x4 in Discharge Instruction: Apply to wound bed as instructed Secondary Dressing Woven Gauze Sponge, Non-Sterile 4x4 in Discharge Instruction: Apply over primary dressing as directed. Secured With Tubigrip Discharge Instruction: slip over left leg lower and over Polymem ( where wound bed is)) Compression Wrap Urgo K2 Lite, (equivalent to a 3 layer) two layer compression system, regular Discharge Instruction: On Hold (06/20/23) Apply Urgo K2 Lite as directed (alternative to 3 layer compression). Compression Stockings Add-Ons Electronic Signature(s) Signed: 06/20/2023 4:01:32 PM By: Karie Schwalbe RN Entered By: Karie Schwalbe on 06/20/2023 14:56:31 [name] ([id number]) [id number] Page 8 of 8 -------------------------------------------------------------------------------- Vitals Details Patient Name: Date of Service: [place], [name] 06/20/2023 2:00 PM Medical Record Number: [id number] Patient Account Number: [id number] Date of Birth/Sex: Treating RN: 04/13/1933 (89 y.o. F) Primary Care Leeona Mccardle: [name] Other Clinician: Referring Keaundra Stehle: Treating Rhia Blatchford/Extender: [name] in Treatment: 4 Vital Signs Time Taken: 14:05 Temperature (F): 98.3 Height (in): 62 Pulse (bpm):  58 Weight (lbs): 105 Respiratory Rate (breaths/min): 18 Body Mass Index (BMI): 19.2 Blood Pressure (mmHg): 118/53 Reference Range: 80 - 120 mg / dl Electronic Signature(s) Signed: 06/27/2023 4:44:01 PM By: Thayer Dallas Entered By: Thayer Dallas on 06/20/2023 14:04:30

## 2023-07-03 DIAGNOSIS — R69 Illness, unspecified: Secondary | ICD-10-CM | POA: Diagnosis not present

## 2023-07-05 DIAGNOSIS — R69 Illness, unspecified: Secondary | ICD-10-CM | POA: Diagnosis not present

## 2023-07-07 ENCOUNTER — Emergency Department (HOSPITAL_BASED_OUTPATIENT_CLINIC_OR_DEPARTMENT_OTHER)
Admission: EM | Admit: 2023-07-07 | Discharge: 2023-07-07 | Disposition: A | Discharge status: Home / Self Care | Payer: PPO | Attending: Emergency Medicine | Admitting: Emergency Medicine

## 2023-07-07 ENCOUNTER — Emergency Department (HOSPITAL_BASED_OUTPATIENT_CLINIC_OR_DEPARTMENT_OTHER): Payer: PPO

## 2023-07-07 ENCOUNTER — Encounter (HOSPITAL_BASED_OUTPATIENT_CLINIC_OR_DEPARTMENT_OTHER): Payer: Self-pay

## 2023-07-07 ENCOUNTER — Other Ambulatory Visit: Payer: Self-pay

## 2023-07-07 DIAGNOSIS — Z79899 Other long term (current) drug therapy: Secondary | ICD-10-CM | POA: Insufficient documentation

## 2023-07-07 DIAGNOSIS — K529 Noninfective gastroenteritis and colitis, unspecified: Secondary | ICD-10-CM | POA: Insufficient documentation

## 2023-07-07 DIAGNOSIS — Z91040 Latex allergy status: Secondary | ICD-10-CM | POA: Insufficient documentation

## 2023-07-07 DIAGNOSIS — R197 Diarrhea, unspecified: Secondary | ICD-10-CM | POA: Diagnosis not present

## 2023-07-07 DIAGNOSIS — Z1152 Encounter for screening for COVID-19: Secondary | ICD-10-CM | POA: Insufficient documentation

## 2023-07-07 DIAGNOSIS — K5730 Diverticulosis of large intestine without perforation or abscess without bleeding: Secondary | ICD-10-CM | POA: Diagnosis not present

## 2023-07-07 DIAGNOSIS — N2889 Other specified disorders of kidney and ureter: Secondary | ICD-10-CM | POA: Diagnosis not present

## 2023-07-07 DIAGNOSIS — R9431 Abnormal electrocardiogram [ECG] [EKG]: Secondary | ICD-10-CM | POA: Diagnosis not present

## 2023-07-07 DIAGNOSIS — R7989 Other specified abnormal findings of blood chemistry: Secondary | ICD-10-CM | POA: Diagnosis not present

## 2023-07-07 DIAGNOSIS — R1084 Generalized abdominal pain: Secondary | ICD-10-CM | POA: Diagnosis not present

## 2023-07-07 LAB — CBC WITH DIFFERENTIAL/PLATELET
Abs Immature Granulocytes: 0.08 10*3/uL — ABNORMAL HIGH (ref 0.00–0.07)
Basophils Absolute: 0 10*3/uL (ref 0.0–0.1)
Basophils Relative: 0 %
Eosinophils Absolute: 0.1 10*3/uL (ref 0.0–0.5)
Eosinophils Relative: 1 %
HCT: 35.1 % — ABNORMAL LOW (ref 36.0–46.0)
Hemoglobin: 12.4 g/dL (ref 12.0–15.0)
Immature Granulocytes: 1 %
Lymphocytes Relative: 15 %
Lymphs Abs: 1.4 10*3/uL (ref 0.7–4.0)
MCH: 32 pg (ref 26.0–34.0)
MCHC: 35.3 g/dL (ref 30.0–36.0)
MCV: 90.5 fL (ref 80.0–100.0)
Monocytes Absolute: 1.7 10*3/uL — ABNORMAL HIGH (ref 0.1–1.0)
Monocytes Relative: 17 %
Neutro Abs: 6.5 10*3/uL (ref 1.7–7.7)
Neutrophils Relative %: 66 %
Platelets: 438 10*3/uL — ABNORMAL HIGH (ref 150–400)
RBC: 3.88 MIL/uL (ref 3.87–5.11)
RDW: 13.2 % (ref 11.5–15.5)
WBC: 9.8 10*3/uL (ref 4.0–10.5)
nRBC: 0 % (ref 0.0–0.2)

## 2023-07-07 LAB — COMPREHENSIVE METABOLIC PANEL
ALT: 89 U/L — ABNORMAL HIGH (ref 0–44)
AST: 152 U/L — ABNORMAL HIGH (ref 15–41)
Albumin: 3.3 g/dL — ABNORMAL LOW (ref 3.5–5.0)
Alkaline Phosphatase: 113 U/L (ref 38–126)
Anion gap: 9 (ref 5–15)
BUN: 22 mg/dL (ref 8–23)
CO2: 26 mmol/L (ref 22–32)
Calcium: 8.2 mg/dL — ABNORMAL LOW (ref 8.9–10.3)
Chloride: 97 mmol/L — ABNORMAL LOW (ref 98–111)
Creatinine, Ser: 1 mg/dL (ref 0.44–1.00)
GFR, Estimated: 55 mL/min — ABNORMAL LOW (ref >60)
Glucose, Bld: 134 mg/dL — ABNORMAL HIGH (ref 70–99)
Potassium: 3.9 mmol/L (ref 3.5–5.1)
Sodium: 132 mmol/L — ABNORMAL LOW (ref 135–145)
Total Bilirubin: 0.7 mg/dL (ref 0.3–1.2)
Total Protein: 6.3 g/dL — ABNORMAL LOW (ref 6.5–8.1)

## 2023-07-07 LAB — LIPASE, BLOOD: Lipase: 115 U/L — ABNORMAL HIGH (ref 11–51)

## 2023-07-07 LAB — RESP PANEL BY RT-PCR (RSV, FLU A&B, COVID)  RVPGX2
Influenza A by PCR: NEGATIVE
Influenza B by PCR: NEGATIVE
Resp Syncytial Virus by PCR: NEGATIVE
SARS Coronavirus 2 by RT PCR: NEGATIVE

## 2023-07-07 LAB — URINALYSIS, ROUTINE W REFLEX MICROSCOPIC
Bilirubin Urine: NEGATIVE
Glucose, UA: NEGATIVE mg/dL
Ketones, ur: NEGATIVE mg/dL
Nitrite: NEGATIVE
Protein, ur: 30 mg/dL — AB
Specific Gravity, Urine: 1.044 — ABNORMAL HIGH (ref 1.005–1.030)
WBC, UA: >50 WBC/hpf (ref 0–5)
pH: 6 (ref 5.0–8.0)

## 2023-07-07 MED ORDER — HYOSCYAMINE SULFATE 0.125 MG SL SUBL: 0.1250 mg | SUBLINGUAL_TABLET | Freq: Four times a day (QID) | SUBLINGUAL | 0 refills | Status: DC | PRN

## 2023-07-07 MED ORDER — AMOXICILLIN-POT CLAVULANATE 875-125 MG PO TABS
1.0000 | ORAL_TABLET | Freq: Once | ORAL | Status: AC
  Administered 2023-07-07: 1 via ORAL
  Filled 2023-07-07: qty 1

## 2023-07-07 MED ORDER — AMOXICILLIN-POT CLAVULANATE 875-125 MG PO TABS: 1.0000 | ORAL_TABLET | Freq: Two times a day (BID) | ORAL | 0 refills | Status: DC

## 2023-07-07 MED ORDER — SODIUM CHLORIDE 0.9 % IV BOLUS
1000.0000 mL | Freq: Once | INTRAVENOUS | Status: AC
  Administered 2023-07-07: 1000 mL via INTRAVENOUS

## 2023-07-07 MED ORDER — CALCIUM GLUCONATE-NACL 1-0.675 GM/50ML-% IV SOLN
1.0000 g | Freq: Once | INTRAVENOUS | Status: AC
  Administered 2023-07-07: 1000 mg via INTRAVENOUS
  Filled 2023-07-07: qty 50

## 2023-07-07 MED ORDER — IOHEXOL 300 MG/ML  SOLN
100.0000 mL | Freq: Once | INTRAMUSCULAR | Status: AC | PRN
  Administered 2023-07-07: 100 mL via INTRAVENOUS

## 2023-07-07 NOTE — ED Triage Notes (Signed)
Pt recently returned from a trip to [place]. While there pt started having abd pain and diarrhea. Pt was states she was dx with colitis and was given abx on 9/16. Pt reports mild improvement, but pt thinks she is dehydrated. Pt reports symptoms of muscle aches, and cramps.

## 2023-07-07 NOTE — Discharge Instructions (Addendum)
Thank for letting us take care of you today.   Your CT is significant for colitis and sigmoid colon diverticulosis (without evidence for acute diverticulitis/infection of diverticula).  Your liver enzymes are elevated which may be due to taking tylenol but you should follow up with your PCP for repeat labwork following end of colitis  Your lipase is elevated which normally indicates pancreatitis but CT is negative for inflammation of pancreas or other pathology with pain.  Your urine is not indicative of infection but indicates that you are dehydrated.  Please keep maintaining adequate oral hydration  Please refer to MyChart for results of right upper quadrant ultrasound and hepatitis panel.  May follow-up with primary care provider  I have sent Levsin (treats/diarrheal symptoms) and Augmentin to your pharmacy of [facility] on [address]  These take as directed  Please provide stool sample and you will be updated with results when it results in 24 to 48 hours  Return to emergency department if you experience worsening of symptoms, inability to keep food down secondary to vomiting, fever, severe dehydration

## 2023-07-07 NOTE — ED Provider Notes (Incomplete)
16th tx for colitis, on abx Still having diarrhea. Nonbloody, still watery. Husband asymptomatic CT showing colitis Has abdominal tenderness Physical Exam  BP (!) 123/57   Pulse 66   Temp 98.4 F (36.9 C) (Temporal)   Resp 14   Ht 5\' 2"  (1.575 m)   Wt 47.6 kg   SpO2 100%   BMI 19.20 kg/m   Physical Exam  Procedures  Procedures  ED Course / MDM   Clinical Course as of 07/07/23 1918  Sat Jul 07, 2023  1800 Calcium(!): 8.2 Hypocalcemia noted at 8.2. Supplementation provided [LB]    Clinical Course User Index [LB] Judithann Sheen, PA   Medical Decision Making Amount and/or Complexity of Data Reviewed Labs: ordered. Decision-making details documented in ED Course. Radiology: ordered.  Risk Prescription drug management.   ***

## 2023-07-07 NOTE — ED Notes (Signed)
US at bedside

## 2023-07-07 NOTE — ED Provider Notes (Signed)
[place] EMERGENCY DEPARTMENT AT [facility] Provider Note   CSN: [id number] Arrival date & time: 07/07/23  1450     History  Chief Complaint  Patient presents with   Abdominal Pain    [name] is a 89 y.o. female with husband with past medical history of RA presents to emergency department for evaluation of diffuse abdominal pain and diarrhea since 07/02/2023.  She was seen in hospital in [place] on 07/03/23 and diagnosed with colitis.  She was prescribed ofloxacin, Flagyl, metoclopramide, Tiorfan and took meds for three out of five days.  She reports continued diarrhea with mild improvement to pain.  She denies fever, hematochezia. Husband reports that he does not have symptoms of abdominal pain nor diarrhea.   Abdominal Pain Associated symptoms: no chest pain, no fever and no shortness of breath        Home Medications Prior to Admission medications   Medication Sig Start Date End Date Taking? Authorizing Provider  amoxicillin-clavulanate (AUGMENTIN) 875-125 MG tablet Take 1 tablet by mouth every 12 (twelve) hours. 07/07/23  Yes Judithann Sheen, PA  hyoscyamine (LEVSIN/SL) 0.125 MG SL tablet Place 1 tablet (0.125 mg total) under the tongue every 6 (six) hours as needed. 07/07/23  Yes Judithann Sheen, PA  Acetaminophen (TYLENOL EXTRA STRENGTH PO) Take by mouth.    [provider]  B Complex Vitamins (B-COMPLEX/B-12 PO) Take by mouth daily.    [provider]  betamethasone dipropionate 0.05 % cream SMARTSIG:Sparingly Topical Twice Daily 10/20/21   [provider]  Biotin 10 MG TABS Take by mouth.    [provider]  Calcium Citrate (CITRACAL PO) Take by mouth.    [provider]  clobetasol (TEMOVATE) 0.05 % external solution SMARTSIG:1 Milliliter(s) Topical Every Night 05/09/21   [provider]  conjugated estrogens (PREMARIN) vaginal cream PLACE 0.5 MG VAGINALLY 2 TIMES A WEEK AT BEDTIME 11/06/22   Ardell Isaacs,  Forrestine Him, MD  diclofenac sodium (VOLTAREN) 1 % GEL  10/24/16   [provider]  estradiol (VIVELLE-DOT) 0.0375 MG/24HR APPLY 1/2 PATCH TOPICALLY TO THE SKIN 2 TIMES A WEEK 11/06/22   Ardell Isaacs, Forrestine Him, MD  hydroxychloroquine (PLAQUENIL) 200 MG tablet Take 200 mg by mouth daily. 08/25/19   [provider]  inFLIXimab-abda (RENFLEXIS IV) Inject into the vein.    [provider]  ipratropium (ATROVENT) 0.06 % nasal spray SMARTSIG:2 Spray(s) Both Nares 3 Times Daily PRN    [provider]  metoprolol succinate (TOPROL-XL) 25 MG 24 hr tablet Takes 1/2 05/21/19   [provider]  Multiple Vitamin (MULTIVITAMIN ADULT) TABS     [provider]  naloxone (NARCAN) nasal spray 4 mg/0.1 mL SMARTSIG:Both Nares 07/13/22   [provider]  NONFORMULARY OR COMPOUNDED ITEM Testosterone Propionate Petrolatum (jar) 2% ointment Sig: APPLY ONE TEAR-DROP SIZE AMOUNT TO INNER THIGH AREA AT BEDTIME 1 TIME A WEEK.  ALTERNATE THIGHS. 06/21/23   Amundson Shirley Friar, MD  OVER THE COUNTER MEDICATION Place 1 drop into both eyes 2 (two) times daily as needed (redness/ dry eyes). Over the counter eye drops    [provider]  oxyCODONE (OXY IR/ROXICODONE) 5 MG immediate release tablet Take 5 mg by mouth at bedtime as needed. 06/15/20   [provider]  pantoprazole (PROTONIX) 20 MG tablet Take 20 mg by mouth daily. 10/24/21   [provider]  RESTASIS 0.05 % ophthalmic emulsion INT 1 GTT INTO OU BID  11/12/18   [provider]  valACYclovir (VALTREX) 1000 MG tablet as needed.  01/06/17   [provider]  zolpidem (AMBIEN) 10 MG tablet Take 5-10 mg by mouth at bedtime as needed. 02/13/22   [provider]      Allergies    Sulfa antibiotics and Latex    Review of Systems   Review of Systems  Constitutional:  Negative for fever.  Respiratory:  Negative for shortness of breath.   Cardiovascular:  Negative for  chest pain.  Gastrointestinal:  Positive for abdominal pain.    Physical Exam Updated Vital Signs BP (!) 123/57   Pulse 66   Temp 98.4 F (36.9 C) (Oral)   Resp 14   Ht 5\' 2"  (1.575 m)   Wt 47.6 kg   SpO2 100%   BMI 19.20 kg/m  Physical Exam Vitals and nursing note reviewed.  Constitutional:      General: She is not in acute distress.    Appearance: Normal appearance.  HENT:     Head: Normocephalic and atraumatic.  Eyes:     General: No scleral icterus.    Conjunctiva/sclera: Conjunctivae normal.  Cardiovascular:     Rate and Rhythm: Normal rate.     Pulses: Normal pulses.  Pulmonary:     Effort: Pulmonary effort is normal.     Breath sounds: Normal breath sounds.  Abdominal:     General: There is no distension.     Palpations: Abdomen is soft.     Tenderness: There is generalized abdominal tenderness. There is no rebound.  Musculoskeletal:        General: Normal range of motion.  Skin:    General: Skin is warm.     Capillary Refill: Capillary refill takes less than 2 seconds.     Coloration: Skin is not jaundiced or pale.  Neurological:     Mental Status: She is alert. Mental status is at baseline.     ED Results / Procedures / Treatments   Labs (all labs ordered are listed, but only abnormal results are displayed) Labs Reviewed  CBC WITH DIFFERENTIAL/PLATELET - Abnormal; Notable for the following components:      Result Value   HCT 35.1 (*)    Platelets 438 (*)    Monocytes Absolute 1.7 (*)    Abs Immature Granulocytes 0.08 (*)    All other components within normal limits  COMPREHENSIVE METABOLIC PANEL - Abnormal; Notable for the following components:   Sodium 132 (*)    Chloride 97 (*)    Glucose, Bld 134 (*)    Calcium 8.2 (*)    Total Protein 6.3 (*)    Albumin 3.3 (*)    AST 152 (*)    ALT 89 (*)    GFR, Estimated 55 (*)    All other components within normal limits  LIPASE, BLOOD - Abnormal; Notable for the following components:   Lipase 115  (*)    All other components within normal limits  URINALYSIS, ROUTINE W REFLEX MICROSCOPIC - Abnormal; Notable for the following components:   APPearance HAZY (*)    Specific Gravity, Urine 1.044 (*)    Hgb urine dipstick TRACE (*)    Protein, ur 30 (*)    Leukocytes,Ua LARGE (*)    Bacteria, UA RARE (*)    All other components within normal limits  RESP PANEL BY RT-PCR (RSV, FLU A&B, COVID)  [id number]  GASTROINTESTINAL PANEL BY PCR, STOOL (REPLACES STOOL CULTURE)  C DIFFICILE QUICK SCREEN  W PCR REFLEX    HEPATITIS PANEL, ACUTE    EKG EKG Interpretation Date/Time:  Saturday July 07 2023 16:16:02 EDT Ventricular Rate:  67 PR Interval:  170 QRS Duration:  82 QT Interval:  424 QTC Calculation: 448 R Axis:   47  Text Interpretation: Normal sinus rhythm Confirmed by Virgina Norfolk [phone]) on 07/07/2023 4:19:43 PM  Radiology CT ABDOMEN PELVIS W CONTRAST  Result Date: 07/07/2023 CLINICAL DATA:  Diffuse abdominal pain. EXAM: CT ABDOMEN AND PELVIS WITH CONTRAST TECHNIQUE: Multidetector CT imaging of the abdomen and pelvis was performed using the standard protocol following bolus administration of intravenous contrast. RADIATION DOSE REDUCTION: This exam was performed according to the departmental dose-optimization program which includes automated exposure control, adjustment of the mA and/or kV according to patient size and/or use of iterative reconstruction technique. CONTRAST:  OMNIPAQUE IOHEXOL 300 MG/ML  SOLN COMPARISON:  None Available. FINDINGS: Lower chest: No acute abnormality. Hepatobiliary: No focal liver abnormality is seen. No gallstones, gallbladder wall thickening, or biliary dilatation. Pancreas: Unremarkable. No pancreatic ductal dilatation or surrounding inflammatory changes. Spleen: Normal in size without focal abnormality. Adrenals/Urinary Tract: The right kidney is low-lying. Otherwise, the bilateral kidneys, adrenal glands and bladder are within normal limits. Note  is made of an extrarenal pelvis on the right. Stomach/Bowel: Evaluation for bowel pathology is limited secondary to lack of oral contrast. There is no evidence for free air, bowel obstruction or pneumatosis. The appendix is not well seen. Air-fluid levels are seen throughout the mid and proximal colon. There is some wall thickening of the distal transverse colon. There is likely sigmoid colon diverticulosis. No definite acute inflammatory change present. Small bowel loops and stomach are decompressed. Vascular/Lymphatic: Aortic atherosclerosis. No enlarged abdominal or pelvic lymph nodes. Reproductive: Status post hysterectomy. No adnexal masses. Other: No abdominal wall hernia or abnormality. No abdominopelvic ascites. Musculoskeletal: Multilevel degenerative changes affect the spine. There is grade 1 anterolisthesis at L4-L5. IMPRESSION: 1. Wall thickening of the distal transverse colon worrisome for colitis. 2. Air-fluid levels throughout the mid and proximal colon compatible with diarrheal disease. 3. Sigmoid colon diverticulosis without evidence for acute diverticulitis. 4. Aortic atherosclerosis. Aortic Atherosclerosis (ICD10-I70.0). Electronically Signed   By: Darliss Cheney M.D.   On: 07/07/2023 18:36    Procedures Procedures    Medications Ordered in ED Medications  amoxicillin-clavulanate (AUGMENTIN) 875-125 MG per tablet 1 tablet (has no administration in time range)  sodium chloride 0.9 % bolus 1,000 mL (0 mLs Intravenous Stopped 07/07/23 1923)  iohexol (OMNIPAQUE) 300 MG/ML solution 100 mL (100 mLs Intravenous Contrast Given 07/07/23 1734)  calcium gluconate 1 g/ 50 mL sodium chloride IVPB (0 mg Intravenous Stopped 07/07/23 1923)    ED Course/ Medical Decision Making/ A&P Clinical Course as of 07/07/23 1944  Sat Jul 07, 2023  1800 Calcium(!): 8.2 Hypocalcemia noted at 8.2. Supplementation provided [LB]    Clinical Course User Index [LB] Judithann Sheen, PA                                  Medical Decision Making Amount and/or Complexity of Data Reviewed Labs: ordered. Radiology: ordered.   Upon evaluation patient patient is resting comfortably in bed.  See HPI for further details  Upon assessment, patient is dry.  She reports anytime that she eats that she has diarrhea subsequently.  She denies vomiting.  She reports mild improvement of symptoms following antibiotics but is still continued  to have diarrhea and abdominal pain. Provided 1L NS. CBC is not significant for leukocytosis nor anemia. CMP is significant for hypocalcemia which is supplemented via IV. Transaminitis noted and significantly elevated from baseline labs from 11 years ago and may be secondary to recent Tylenol usage. Will obtain RUQ U/S and hep A panel to rule out acute liver pathology and hepatitis etiology of elevated liver labs. Upon exam, patient is tender to mild palpation of all quadrants. CT abd pelvis significant for colitis, air-fluid levels consistent with diarrheal disease and sigmoid colon diverticulosis. UA negative for infection but consistent with dehydration.    Discussed admission versus discharge with patient.  Patient adamantly does not want to be admitted and believes that she can manage symptoms at home with husband. As patient has not finished antibiotics, discussed with patient to finish antibiotics prescribed and to add Augmentin for broader coverage. Prescribed Levsin and Augmentin to cover possible bacterial etiology and attempt to control diarrhea.  Will provide patient with results of right upper quadrant ultrasound and hep a panel via MyChart.  Explained how to provide stool sample at home for etiology of symptoms and rule out C. Difficile. discussed with patient to follow-up with primary care provider regarding results of those.  Explained the imaging and lab findings with patient who expresses understanding.  Discussed strict return precautions to include but not limited to worsening of  symptoms, inability to maintain adequate hydration intractable vomiting, fever, weakness, hematochezia.  Patient expresses understanding with these and wishes to be discharged.        Final Clinical Impression(s) / ED Diagnoses Final diagnoses:  Colitis    Rx / DC Orders ED Discharge Orders          Ordered    amoxicillin-clavulanate (AUGMENTIN) 875-125 MG tablet  Every 12 hours        07/07/23 1937    hyoscyamine (LEVSIN/SL) 0.125 MG SL tablet  Every 6 hours PRN        07/07/23 1937              Judithann Sheen, PA 07/07/23 1944    Virgina Norfolk, DO 07/07/23 2004

## 2023-07-08 LAB — HEPATITIS PANEL, ACUTE
HCV Ab: NONREACTIVE
Hep A IgM: NONREACTIVE
Hep B C IgM: NONREACTIVE
Hepatitis B Surface Ag: NONREACTIVE

## 2023-07-08 LAB — C DIFFICILE QUICK SCREEN W PCR REFLEX
C Diff antigen: NEGATIVE
C Diff interpretation: NOT DETECTED
C Diff toxin: NEGATIVE

## 2023-07-09 DIAGNOSIS — M0609 Rheumatoid arthritis without rheumatoid factor, multiple sites: Secondary | ICD-10-CM | POA: Diagnosis not present

## 2023-07-09 DIAGNOSIS — K529 Noninfective gastroenteritis and colitis, unspecified: Secondary | ICD-10-CM | POA: Diagnosis not present

## 2023-07-09 DIAGNOSIS — L409 Psoriasis, unspecified: Secondary | ICD-10-CM | POA: Diagnosis not present

## 2023-07-09 DIAGNOSIS — M542 Cervicalgia: Secondary | ICD-10-CM | POA: Diagnosis not present

## 2023-07-09 DIAGNOSIS — Z1589 Genetic susceptibility to other disease: Secondary | ICD-10-CM | POA: Diagnosis not present

## 2023-07-09 DIAGNOSIS — Z681 Body mass index (BMI) 19 or less, adult: Secondary | ICD-10-CM | POA: Diagnosis not present

## 2023-07-09 DIAGNOSIS — M459 Ankylosing spondylitis of unspecified sites in spine: Secondary | ICD-10-CM | POA: Diagnosis not present

## 2023-07-09 DIAGNOSIS — M154 Erosive (osteo)arthritis: Secondary | ICD-10-CM | POA: Diagnosis not present

## 2023-07-09 LAB — GASTROINTESTINAL PANEL BY PCR, STOOL (REPLACES STOOL CULTURE)

## 2023-07-11 ENCOUNTER — Encounter (HOSPITAL_BASED_OUTPATIENT_CLINIC_OR_DEPARTMENT_OTHER): Payer: PPO | Admitting: Physician Assistant

## 2023-07-11 DIAGNOSIS — L97828 Non-pressure chronic ulcer of other part of left lower leg with other specified severity: Secondary | ICD-10-CM | POA: Diagnosis not present

## 2023-07-11 DIAGNOSIS — L89890 Pressure ulcer of other site, unstageable: Secondary | ICD-10-CM | POA: Diagnosis not present

## 2023-07-11 DIAGNOSIS — I872 Venous insufficiency (chronic) (peripheral): Secondary | ICD-10-CM | POA: Diagnosis not present

## 2023-07-11 NOTE — Progress Notes (Addendum)
[name], [name] ([id number]) [id number] Page 1 of 8 Visit Report for 07/11/2023 Chief Complaint Document Details Patient Name: Date of Service: [name], [name] [name] 07/11/2023 1:15 PM Medical Record Number: [id number] Patient Account Number: [id number] Date of Birth/Sex: Treating RN: September 08, 1933 (89 y.o. F) Primary Care Provider: Thora [name] Other Clinician: Referring Provider: Treating Provider/Extender: [name] in Treatment: 7 Information Obtained from: Patient Chief Complaint Left LE Ulcer and right lateral foot pressure ulcer Electronic Signature(s) Signed: 07/11/2023 2:29:41 PM By: [name] Derry PA-C Previous Signature: 07/11/2023 1:28:52 PM Version By: [name] Derry PA-C Entered By: [name] on 07/11/2023 14:29:41 -------------------------------------------------------------------------------- HPI Details Patient Name: Date of Service: [name], [name] 07/11/2023 1:15 PM Medical Record Number: [id number] Patient Account Number: [id number] Date of Birth/Sex: Treating RN: 01-14-1933 (89 y.o. F) Primary Care Provider: Thora [name] Other Clinician: Referring Provider: Treating Provider/Extender: [name] in Treatment: 7 History of Present Illness HPI Description: READMISSION 07/15/2019 Patient is now an 89 year old woman who was previously here in 2016 and 2018. Cared for at the time by Dr. Meyer [name]. Both times with wounds related to trauma in the left leg. She was felt to have underlying venous insufficiency although both occasions were related to trauma. The patient tells me that 2 weeks ago she hit her lateral left leg on the car door. This was a skin tear with a flap for a period of time she has been using Polysporin. The flap came off recently she has a clean looking superficial wound on the left lateral leg. Our intake nurse reported some drainage. Past medical history; includes  sensorineural hearing loss, osteoarthritis, hypertension and a hammertoe on the right foot for which she follows with podiatry. ABIs in our clinic were 1.19 on the left 07/21/2019; the patient did not like the wraps. They slid down and rub the wound the wound is measuring larger she is upset. 10/12; left lateral leg wound. Most of this looks healthy even under illumination. We have been using Hydrofera Blue under border foam. She would not allow compression 10/19; left lateral leg wound. 2 small open areas remain of the original wound. We have been using Hydrofera Blue under border foam. This seems to be making decent progress 10/26 left lateral leg traumatic wound. There is no open wound remaining. We have been using Hydrofera Blue under border foam she arrived with some denuded epithelium that I removed there is no open wound remaining. Is fairly clear she has some degree of chronic venous insufficiency with not a lot of edema but dilated veins in her feet. She reminds me that she also has reactive arthritis and was on prednisone for a prolonged period of time. Nevertheless I think it would be beneficial for her to at least wear support stockings but right now I do not think she is going to agree to the Readmission: 02/18/2020 upon evaluation today patient has sustained a skin tear which occurred about 1 week ago she tells me. Fortunately there does not appear to be any signs of active infection at this time which is excellent news. She has been tolerating the dressing changes without complication. Overall very pleased with where things stand at this point. The only issue that I see here is that the skin flap somewhat folded back and then reattached further down causing an area that is actually bunched up to form where it closed on itself but then reattached on the end of the tissue.  Nonetheless I think we have to trim off the bunched up tissue in order to allow this to heal appropriately. That is the  only issue I really see today. [name], [name] ([id number]) [id number] Page 2 of 8 03/10/2020 upon evaluation today patient actually appears to be doing excellent at this time. She has healed quite nicely and has been a couple of weeks since I saw her. Overall I feel like she is completely healed and ready for discharge as of today. Readmission: 03-21-2023 upon evaluation today patient appears to be doing somewhat poorly in regard to the left wound ulcer she tells me has been present for about a month. She tells me that she had this on the metal flatbed shopping carts at [name] and subsequently has been having issues here with this since she tells me that the original Band-Aid she was using caused this to get worse. With that being said I do not see any evidence of active infection at this time everything seems to really be doing quite well as far as I am concerned. 03-28-2023 upon evaluation today patient appears to be doing well currently in regard to her wounds which in fact appear to be completely healed. I am very pleased with this. Readmission: 05-23-2023 upon evaluation patient presents for reevaluation here in the clinic concerning issues that she has been having with her left anterior lower extremity. This actually appears to have a significant amount of new skin growth over top of the areas of irritation I am not exactly sure what happened here to be perfectly honest. I explained to the patient that this is kind of an unusual presentation for this to be this irritated and yet not have any significant openings at this time. I do not see any evidence of active infection locally or systemically which is great news. 05-30-2023 upon evaluation today patient appears to be doing well currently in regard to her lower extremity wounds which are actually drying out the may be a little bit too dry. I think PolyMem may be better for her and we discussed that today. Fortunately I do  not see any signs of active infection locally or systemically which is great news. 06-06-2023 upon evaluation today patient appears to be doing well currently in regard to her wounds. Both are showing signs of improvement which is great news. Fortunately I do not see any signs of active infection locally or systemically at this time. 8/28; the distal wound on the left leg is healed she still has a comma shaped proximal wound medially. She reminds as she will be leaving for [place] on September night. The remaining wound is on the right upper medial calf a comma shaped wound this may be closed by that time. We had some discussion about compression stockings 06-20-2023 upon evaluation today patient appears to be doing well currently in regard to her wounds in fact most everything appears to be about healed although she does have some dry skin over top of the distal left lateral leg. The proximal medial leg is very close to closure and seems to be doing better. 07-11-2023 upon evaluation today patient appears to be doing well currently in regard to her wound. She has been tolerating the dressing changes without complication. Fortunately there does not appear to be any signs of infection locally or systemically which is great news. Unfortunately she has been having issues with colitis and she appears to be very pale today she also tells me that she has been feeling  fairly poorly. Her rheumatologist to put her on prednisone she is also been put on antibiotics for the colitis. Her wounds on the left anterior lower extremity have reinitiated a little bit here and I am beginning to suspect this may be more autoimmune related. Electronic Signature(s) Signed: 07/11/2023 2:28:11 PM By: [name] Derry PA-C Entered By: [name] on 07/11/2023 14:28:11 -------------------------------------------------------------------------------- Physical Exam Details Patient Name: Date of Service: [place], [name] 07/11/2023 1:15  PM Medical Record Number: [id number] Patient Account Number: [id number] Date of Birth/Sex: Treating RN: 1933/05/28 (89 y.o. F) Primary Care Provider: Thora [name] Other Clinician: Referring Provider: Treating Provider/Extender: [name] in Treatment: 7 Constitutional Well-nourished and well-hydrated in no acute distress. Respiratory normal breathing without difficulty. Psychiatric this patient is able to make decisions and demonstrates good insight into disease process. Alert and Oriented x 3. pleasant and cooperative. Notes Upon inspection patient's wound bed actually showed signs on the left lower extremity of still having open wounds at this time. Unfortunately I think that this is an ongoing issue that seems to be more inflammatory at this point. She voiced understanding would actually try some topical steroids/triamcinolone on this site and I will send in a prescription for her as well. With that being said she also unfortunately has a new wound on the right lateral foot which unfortunately there is showing signs here of pressure and friction and I think this has caused the breakdown. Fortunately I do not see any evidence of active infection locally or systemically which is great news. No fevers, chills, nausea, vomiting, or diarrhea. Electronic Signature(s) Signed: 07/11/2023 2:30:49 PM By: [name] Derry PA-C Entered By: [name] on 07/11/2023 14:30:49 [name] ([id number]) [id number] Page 3 of 8 -------------------------------------------------------------------------------- Physician Orders Details Patient Name: Date of Service: [name], [name] [name] 07/11/2023 1:15 PM Medical Record Number: [id number] Patient Account Number: [id number] Date of Birth/Sex: Treating RN: 1933/03/24 (89 y.o. [name] Primary Care Provider: Thora [name] Other Clinician: Referring Provider: Treating Provider/Extender: [name] in Treatment: 7 Verbal / Phone Orders: No Diagnosis Coding ICD-10 Coding Code Description [phone] Chronic venous hypertension (idiopathic) with ulcer and inflammation of left lower extremity L97.822 Non-pressure chronic ulcer of other part of left lower leg with fat layer exposed L97.822 Non-pressure chronic ulcer of other part of left lower leg with fat layer exposed I10 Essential (primary) hypertension Follow-up Appointments ppointment in 1 week. - with [name] Wednesday Room 7 Return A ppointment in 2 weeks. [facility] Wednesday room 7 Return A Return appointment in 3 weeks. [facility] Wednesday Bathing/ Shower/ Hygiene May shower and wash wound with soap and water. Edema Control - Lymphedema / SCD / Other Elevate legs to the level of the heart or above for 30 minutes daily and/or when sitting for 3-4 times a day throughout the day. Avoid standing for long periods of time. Exercise regularly Wound Treatment Wound #10 - Foot Wound Laterality: Right, Lateral Cleanser: Soap and Water 1 x Per Day/30 Days Discharge Instructions: May shower and wash wound with dial antibacterial soap and water prior to dressing change. Peri-Wound Care: Sween Lotion (Moisturizing lotion) 1 x Per Day/30 Days Discharge Instructions: Apply moisturizing lotion as directed Prim Dressing: PolyMem Non-Adhesive Dressing, 4x4 in (DME) (Generic) 1 x Per Day/30 Days ary Discharge Instructions: Apply to wound bed as instructed Secondary Dressing: Bordered Gauze, 2x3.75 in (DME) (Generic) 1 x Per Day/30 Days Discharge Instructions: Apply over primary  dressing as directed. Secondary Dressing: Woven Gauze Sponge, Non-Sterile 4x4 in (DME) (Generic) 1 x Per Day/30 Days Discharge Instructions: Apply over primary dressing as directed. Wound #7 - Lower Leg Wound Laterality: Left, Anterior, Distal Cleanser: Soap and Water 1 x Per Day/30 Days Discharge Instructions: May shower and wash wound with  dial antibacterial soap and water prior to dressing change. Peri-Wound Care: Sween Lotion (Moisturizing lotion) 1 x Per Day/30 Days Discharge Instructions: Apply moisturizing lotion as directed Prim Dressing: PolyMem Non-Adhesive Dressing, 4x4 in (DME) (Generic) 1 x Per Day/30 Days ary Discharge Instructions: Apply to wound bed as instructed Secondary Dressing: Woven Gauze Sponge, Non-Sterile 4x4 in (DME) (Generic) 1 x Per Day/30 Days Discharge Instructions: Apply over primary dressing as directed. Secured With: Tubigrip 1 x Per Day/30 Days Wound #9 - Lower Leg Wound Laterality: Left, Anterior Cleanser: Soap and Water 1 x Per Day/30 Days Discharge Instructions: May shower and wash wound with dial antibacterial soap and water prior to dressing change. MCKINZIE, SAKSA ([id number]) [id number] Page 4 of 8 Peri-Wound Care: Sween Lotion (Moisturizing lotion) 1 x Per Day/30 Days Discharge Instructions: Apply moisturizing lotion as directed Prim Dressing: PolyMem Non-Adhesive Dressing, 4x4 in (DME) (Generic) 1 x Per Day/30 Days ary Discharge Instructions: Apply to wound bed as instructed Secondary Dressing: Woven Gauze Sponge, Non-Sterile 4x4 in (DME) (Generic) 1 x Per Day/30 Days Discharge Instructions: Apply over primary dressing as directed. Secured With: Tubigrip 1 x Per Day/30 Days Patient Medications llergies: Sulfa (Sulfonamide Antibiotics), latex A Notifications Medication Indication Start End 07/11/2023 triamcinolone acetonide DOSE 0 - topical 0.1 % ointment - ointment topical once daily with each dressing change x 30 days Electronic Signature(s) Signed: 07/11/2023 3:53:17 PM By: [name] Derry PA-C Signed: 07/11/2023 4:31:50 PM By: Karie Schwalbe RN Previous Signature: 07/11/2023 2:32:19 PM Version By: [name] Derry PA-C Entered By: Karie Schwalbe on 07/11/2023 14:34:36 -------------------------------------------------------------------------------- Problem List  Details Patient Name: Date of Service: [place], [name] 07/11/2023 1:15 PM Medical Record Number: [id number] Patient Account Number: [id number] Date of Birth/Sex: Treating RN: 16-Feb-1933 (89 y.o. F) Primary Care Provider: Thora [name] Other Clinician: Referring Provider: Treating Provider/Extender: [name] in Treatment: 7 Active Problems ICD-10 Encounter Code Description Active Date MDM Diagnosis I87.332 Chronic venous hypertension (idiopathic) with ulcer and inflammation of left 05/23/2023 No Yes lower extremity L97.822 Non-pressure chronic ulcer of other part of left lower leg with fat layer exposed8/04/2023 No Yes L89.893 Pressure ulcer of other site, stage 3 07/11/2023 No Yes L97.822 Non-pressure chronic ulcer of other part of left lower leg with fat layer exposed8/04/2023 No Yes I10 Essential (primary) hypertension 05/23/2023 No Yes Inactive Problems Resolved Problems [name], [name] ([id number]) [id number] Page 5 of 8 Electronic Signature(s) Signed: 07/11/2023 2:29:23 PM By: [name] Derry PA-C Previous Signature: 07/11/2023 1:28:45 PM Version By: [name] Derry PA-C Entered By: [name] on 07/11/2023 14:29:23 -------------------------------------------------------------------------------- Progress Note Details Patient Name: Date of Service: [place], [name] 07/11/2023 1:15 PM Medical Record Number: [id number] Patient Account Number: [id number] Date of Birth/Sex: Treating RN: 09/01/1933 (89 y.o. F) Primary Care Provider: Thora [name] Other Clinician: Referring Provider: Treating Provider/Extender: [name] in Treatment: 7 Subjective Chief Complaint Information obtained from Patient Left LE Ulcer and right lateral foot pressure ulcer History of Present Illness (HPI) READMISSION 07/15/2019 Patient is now an 89 year old woman who was previously here in 2016 and 2018. Cared for at the time  by Dr. Meyer [name]. Both times with wounds  related to trauma in the left leg. She was felt to have underlying venous insufficiency although both occasions were related to trauma. The patient tells me that 2 weeks ago she hit her lateral left leg on the car door. This was a skin tear with a flap for a period of time she has been using Polysporin. The flap came off recently she has a clean looking superficial wound on the left lateral leg. Our intake nurse reported some drainage. Past medical history; includes sensorineural hearing loss, osteoarthritis, hypertension and a hammertoe on the right foot for which she follows with podiatry. ABIs in our clinic were 1.19 on the left 07/21/2019; the patient did not like the wraps. They slid down and rub the wound the wound is measuring larger she is upset. 10/12; left lateral leg wound. Most of this looks healthy even under illumination. We have been using Hydrofera Blue under border foam. She would not allow compression 10/19; left lateral leg wound. 2 small open areas remain of the original wound. We have been using Hydrofera Blue under border foam. This seems to be making decent progress 10/26 left lateral leg traumatic wound. There is no open wound remaining. We have been using Hydrofera Blue under border foam she arrived with some denuded epithelium that I removed there is no open wound remaining. Is fairly clear she has some degree of chronic venous insufficiency with not a lot of edema but dilated veins in her feet. She reminds me that she also has reactive arthritis and was on prednisone for a prolonged period of time. Nevertheless I think it would be beneficial for her to at least wear support stockings but right now I do not think she is going to agree to the Readmission: 02/18/2020 upon evaluation today patient has sustained a skin tear which occurred about 1 week ago she tells me. Fortunately there does not appear to be any signs of active infection at  this time which is excellent news. She has been tolerating the dressing changes without complication. Overall very pleased with where things stand at this point. The only issue that I see here is that the skin flap somewhat folded back and then reattached further down causing an area that is actually bunched up to form where it closed on itself but then reattached on the end of the tissue. Nonetheless I think we have to trim off the bunched up tissue in order to allow this to heal appropriately. That is the only issue I really see today. 03/10/2020 upon evaluation today patient actually appears to be doing excellent at this time. She has healed quite nicely and has been a couple of weeks since I saw her. Overall I feel like she is completely healed and ready for discharge as of today. Readmission: 03-21-2023 upon evaluation today patient appears to be doing somewhat poorly in regard to the left wound ulcer she tells me has been present for about a month. She tells me that she had this on the metal flatbed shopping carts at [facility] and subsequently has been having issues here with this since she tells me that the original Band-Aid she was using caused this to get worse. With that being said I do not see any evidence of active infection at this time everything seems to really be doing quite well as far as I am concerned. 03-28-2023 upon evaluation today patient appears to be doing well currently in regard to her wounds which in fact appear to be completely healed. I  am very pleased with this. Readmission: 05-23-2023 upon evaluation patient presents for reevaluation here in the clinic concerning issues that she has been having with her left anterior lower extremity. This actually appears to have a significant amount of new skin growth over top of the areas of irritation I am not exactly sure what happened here to be perfectly honest. I explained to the patient that this is kind of an unusual presentation for  this to be this irritated and yet not have any significant openings at this time. I do not see any evidence of active infection locally or systemically which is great news. 05-30-2023 upon evaluation today patient appears to be doing well currently in regard to her lower extremity wounds which are actually drying out the may be a little bit too dry. I think PolyMem may be better for her and we discussed that today. Fortunately I do not see any signs of active infection locally or systemically which is great news. [name], [name] ([id number]) [id number] Page 6 of 8 06-06-2023 upon evaluation today patient appears to be doing well currently in regard to her wounds. Both are showing signs of improvement which is great news. Fortunately I do not see any signs of active infection locally or systemically at this time. 8/28; the distal wound on the left leg is healed she still has a comma shaped proximal wound medially. She reminds as she will be leaving for [place] on September night. The remaining wound is on the right upper medial calf a comma shaped wound this may be closed by that time. We had some discussion about compression stockings 06-20-2023 upon evaluation today patient appears to be doing well currently in regard to her wounds in fact most everything appears to be about healed although she does have some dry skin over top of the distal left lateral leg. The proximal medial leg is very close to closure and seems to be doing better. 07-11-2023 upon evaluation today patient appears to be doing well currently in regard to her wound. She has been tolerating the dressing changes without complication. Fortunately there does not appear to be any signs of infection locally or systemically which is great news. Unfortunately she has been having issues with colitis and she appears to be very pale today she also tells me that she has been feeling fairly poorly. Her rheumatologist to put  her on prednisone she is also been put on antibiotics for the colitis. Her wounds on the left anterior lower extremity have reinitiated a little bit here and I am beginning to suspect this may be more autoimmune related. Objective Constitutional Well-nourished and well-hydrated in no acute distress. Vitals Time Taken: 1:38 PM, Height: 62 in, Weight: 105 lbs, BMI: 19.2, Temperature: 97.9 F, Pulse: 56 bpm, Respiratory Rate: 18 breaths/min, Blood Pressure: 122/69 mmHg. Respiratory normal breathing without difficulty. Psychiatric this patient is able to make decisions and demonstrates good insight into disease process. Alert and Oriented x 3. pleasant and cooperative. General Notes: Upon inspection patient's wound bed actually showed signs on the left lower extremity of still having open wounds at this time. Unfortunately I think that this is an ongoing issue that seems to be more inflammatory at this point. She voiced understanding would actually try some topical steroids/triamcinolone on this site and I will send in a prescription for her as well. With that being said she also unfortunately has a new wound on the right lateral foot which unfortunately there is showing signs here of pressure  and friction and I think this has caused the breakdown. Fortunately I do not see any evidence of active infection locally or systemically which is great news. No fevers, chills, nausea, vomiting, or diarrhea. Integumentary (Hair, Skin) Wound #10 status is Open. Original cause of wound was Gradually Appeared. The date acquired was: 06/19/2023. The wound is located on the Right,Lateral Foot. The wound measures 0.6cm length x 0.7cm width x 0.2cm depth; 0.33cm^2 area and 0.066cm^3 volume. There is Fat Layer (Subcutaneous Tissue) exposed. There is no tunneling or undermining noted. There is a medium amount of serosanguineous drainage noted. The wound margin is distinct with the outline attached to the wound base.  There is small (1-33%) red granulation within the wound bed. There is a large (67-100%) amount of necrotic tissue within the wound bed including Adherent Slough. The periwound skin appearance had no abnormalities noted for moisture. The periwound skin appearance exhibited: Scarring. Periwound temperature was noted as No Abnormality. Wound #7 status is Open. Original cause of wound was Trauma. The date acquired was: 02/23/2023. The wound has been in treatment 7 weeks. The wound is located on the [name] Lower Leg. The wound measures 1.5cm length x 0.5cm width x 0.1cm depth; 0.589cm^2 area and 0.059cm^3 volume. There is Fat Layer (Subcutaneous Tissue) exposed. There is no tunneling or undermining noted. There is a medium amount of serous drainage noted. The wound margin is flat and intact. There is large (67-100%) red, pink granulation within the wound bed. There is a small (1-33%) amount of necrotic tissue within the wound bed including Adherent Slough. The periwound skin appearance had no abnormalities noted for texture. The periwound skin appearance exhibited: Maceration, Hemosiderin Staining. Periwound temperature was noted as No Abnormality. The periwound has tenderness on palpation. Wound #8 status is Healed - Epithelialized. Original cause of wound was Trauma. The date acquired was: 05/28/2023. The wound has been in treatment 6 weeks. The wound is located on the Left,Medial Lower Leg. The wound measures 0cm length x 0cm width x 0cm depth; 0cm^2 area and 0cm^3 volume. There is no tunneling or undermining noted. There is a none present amount of drainage noted. The wound margin is distinct with the outline attached to the wound base. There is no granulation within the wound bed. There is no necrotic tissue within the wound bed. The periwound skin appearance had no abnormalities noted for moisture. The periwound skin appearance did not exhibit: Callus, Crepitus, Excoriation, Induration,  Rash, Scarring, Atrophie Blanche, Cyanosis, Ecchymosis, Hemosiderin Staining, Mottled, Pallor, Rubor, Erythema. Periwound temperature was noted as No Abnormality. Wound #9 status is Open. Original cause of wound was Gradually Appeared. The date acquired was: 07/08/2023. The wound is located on the Left,Anterior Lower Leg. The wound measures 0.3cm length x 0.5cm width x 0.1cm depth; 0.118cm^2 area and 0.012cm^3 volume. There is no tunneling or undermining noted. There is a medium amount of serosanguineous drainage noted. The wound margin is distinct with the outline attached to the wound base. There is large (67- 100%) granulation within the wound bed. There is a small (1-33%) amount of necrotic tissue within the wound bed including Eschar and Adherent Slough. The periwound skin appearance did not exhibit: Callus, Crepitus, Excoriation, Induration, Rash, Scarring, Dry/Scaly, Maceration, Atrophie Blanche, Cyanosis, Ecchymosis, Hemosiderin Staining, Mottled, Pallor, Rubor, Erythema. Assessment Active Problems ICD-10 Chronic venous hypertension (idiopathic) with ulcer and inflammation of left lower extremity Non-pressure chronic ulcer of other part of left lower leg with fat layer exposed Pressure ulcer of other site, stage 3  Non-pressure chronic ulcer of other part of left lower leg with fat layer exposed Essential (primary) hypertension [name], [name] ([id number]) [id number] Page 7 of 8 Plan Follow-up Appointments: Return Appointment in 1 week. - with Leonard Schwartz Wednesday Room 7 Return Appointment in 2 weeks. Leonard Schwartz Wednesday room 7 Return appointment in 3 weeks. Leonard Schwartz Wednesday Bathing/ Shower/ Hygiene: May shower and wash wound with soap and water. Edema Control - Lymphedema / SCD / Other: Elevate legs to the level of the heart or above for 30 minutes daily and/or when sitting for 3-4 times a day throughout the day. Avoid standing for long periods of time. Exercise  regularly The following medication(s) was prescribed: triamcinolone acetonide topical 0.1 % ointment 0 ointment topical once daily with each dressing change x 30 days starting 07/11/2023 WOUND #10: - Foot Wound Laterality: Right, Lateral Cleanser: Soap and Water 1 x Per Day/30 Days Discharge Instructions: May shower and wash wound with dial antibacterial soap and water prior to dressing change. Peri-Wound Care: Sween Lotion (Moisturizing lotion) 1 x Per Day/30 Days Discharge Instructions: Apply moisturizing lotion as directed Prim Dressing: PolyMem Non-Adhesive Dressing, 4x4 in (DME) (Generic) 1 x Per Day/30 Days ary Discharge Instructions: Apply to wound bed as instructed Secondary Dressing: Bordered Gauze, 2x3.75 in (DME) (Generic) 1 x Per Day/30 Days Discharge Instructions: Apply over primary dressing as directed. Secondary Dressing: Woven Gauze Sponge, Non-Sterile 4x4 in (DME) (Generic) 1 x Per Day/30 Days Discharge Instructions: Apply over primary dressing as directed. WOUND #7: - Lower Leg Wound Laterality: Left, Anterior, Distal Cleanser: Soap and Water 1 x Per Day/30 Days Discharge Instructions: May shower and wash wound with dial antibacterial soap and water prior to dressing change. Peri-Wound Care: Sween Lotion (Moisturizing lotion) 1 x Per Day/30 Days Discharge Instructions: Apply moisturizing lotion as directed Prim Dressing: PolyMem Non-Adhesive Dressing, 4x4 in (DME) (Generic) 1 x Per Day/30 Days ary Discharge Instructions: Apply to wound bed as instructed Secondary Dressing: Woven Gauze Sponge, Non-Sterile 4x4 in (DME) (Generic) 1 x Per Day/30 Days Discharge Instructions: Apply over primary dressing as directed. Secured With: Tubigrip 1 x Per Day/30 Days WOUND #9: - Lower Leg Wound Laterality: Left, Anterior Cleanser: Soap and Water 1 x Per Day/30 Days Discharge Instructions: May shower and wash wound with dial antibacterial soap and water prior to dressing  change. Peri-Wound Care: Sween Lotion (Moisturizing lotion) 1 x Per Day/30 Days Discharge Instructions: Apply moisturizing lotion as directed Prim Dressing: PolyMem Non-Adhesive Dressing, 4x4 in (DME) (Generic) 1 x Per Day/30 Days ary Discharge Instructions: Apply to wound bed as instructed Secondary Dressing: Woven Gauze Sponge, Non-Sterile 4x4 in (DME) (Generic) 1 x Per Day/30 Days Discharge Instructions: Apply over primary dressing as directed. Secured With: Tubigrip 1 x Per Day/30 Days 1. Based on what I am seeing I would recommend that we continue with PolyMem to all wounds in question I think this is probably still good to be the best way to go. 2. Also can recommend that the patient should go ahead and initiate treatment with a triamcinolone to the left lower extremity. I think that this will help with the inflammation I am suspecting that the wounds on the left lower extremity seem to be continuing to blister intermittently here are likely inflammatory in nature likely related to her autoimmune conditions. 3. With regard to her colitis again she seems to be very weak compared to what she was but I am hopeful that she is on the road to recovery here. 4. With  regard to the new pressure ulcer on the lateral portion of her right foot I am going to use PolyMem followed by bordered foam dressing we will see how this does. We will see patient back for reevaluation in 1 week here in the clinic. If anything worsens or changes patient will contact our office for additional recommendations. Electronic Signature(s) Signed: 07/16/2023 8:38:05 AM By: Shawn Stall RN, BSN Signed: 07/17/2023 2:40:07 PM By: [name] Derry PA-C Previous Signature: 07/11/2023 2:32:56 PM Version By: [name] Derry PA-C Entered By: Shawn Stall on 07/16/2023 08:37:15 -------------------------------------------------------------------------------- SuperBill Details Patient Name: Date of Service: [place], [name]  07/11/2023 Medical Record Number: [id number] Patient Account Number: [id number] Date of Birth/Sex: Treating RN: [birth year] (89 y.o. F) Primary Care Provider: Thora [name] Other Clinician: GERRI, [name] ([id number]) [id number] Page 8 of 8 Referring Provider: Treating Provider/Extender: [name] in Treatment: 7 Diagnosis Coding ICD-10 Codes Code Description [phone] Chronic venous hypertension (idiopathic) with ulcer and inflammation of left lower extremity L97.822 Non-pressure chronic ulcer of other part of left lower leg with fat layer exposed L89.893 Pressure ulcer of other site, stage 3 L97.822 Non-pressure chronic ulcer of other part of left lower leg with fat layer exposed I10 Essential (primary) hypertension Physician Procedures : CPT4 Code Description Modifier [id number] - WC PHYS LEVEL 3 - EST PT ICD-10 Diagnosis Description I87.332 Chronic venous hypertension (idiopathic) with ulcer and inflammation of left lower extremity L97.822 Non-pressure chronic ulcer of other part  of left lower leg with fat layer exposed L89.893 Pressure ulcer of other site, stage 3 I10 Essential (primary) hypertension Quantity: 1 Electronic Signature(s) Signed: 07/11/2023 2:33:18 PM By: [name] Derry PA-C Entered By: [name] on 07/11/2023 14:33:18

## 2023-07-11 NOTE — Progress Notes (Signed)
[name], [name] ([id number]) [id number] Page 1 of 10 Visit Report for 07/11/2023 Arrival Information Details Patient Name: Date of Service: [place], [name] 07/11/2023 1:15 PM Medical Record Number: [id number] Patient Account Number: [id number] Date of Birth/Sex: Treating RN: [birth year] (89 y.o. F) Primary Care Labria Wos: [name] Other Clinician: Referring Lissett Favorite: Treating Roniya Tetro/Extender: [name] in Treatment: 7 Visit Information History Since Last Visit Added or deleted any medications: No Patient Arrived: Ambulatory Any new allergies or adverse reactions: No Arrival Time: 13:30 Had a fall or experienced change in No Accompanied By: self activities of daily living that may affect Transfer Assistance: None risk of falls: Patient Identification Verified: Yes Signs or symptoms of abuse/neglect since last visito No Secondary Verification Process Completed: Yes Hospitalized since last visit: No Patient Requires Transmission-Based Precautions: No Implantable device outside of the clinic excluding No Patient Has Alerts: No cellular tissue based products placed in the center since last visit: Has Dressing in Place as Prescribed: Yes Has Compression in Place as Prescribed: Yes Pain Present Now: Yes Electronic Signature(s) Signed: 07/11/2023 4:28:55 PM By: Thayer Dallas Entered By: Thayer Dallas on 07/11/2023 13:32:26 -------------------------------------------------------------------------------- Encounter Discharge Information Details Patient Name: Date of Service: [place], [name] 07/11/2023 1:15 PM Medical Record Number: [id number] Patient Account Number: [id number] Date of Birth/Sex: Treating RN: 1933-09-16 (89 y.o. [name] Primary Care Alvia Jablonski: [name] Other Clinician: Referring Manisha Cancel: Treating Arjen Deringer/Extender: [name] in Treatment: 7 Encounter  Discharge Information Items Discharge Condition: Stable Ambulatory Status: Ambulatory Discharge Destination: Home Transportation: Private Auto Accompanied By: self Schedule Follow-up Appointment: Yes Clinical Summary of Care: Patient Declined Electronic Signature(s) Signed: 07/11/2023 4:31:50 PM By: Karie Schwalbe RN Entered By: Karie Schwalbe on 07/11/2023 16:29:07 [name] ([id number]) [id number].pdf Page 2 of 10 -------------------------------------------------------------------------------- Lower Extremity Assessment Details Patient Name: Date of Service: [place], [name] 07/11/2023 1:15 PM Medical Record Number: [id number] Patient Account Number: [id number] Date of Birth/Sex: Treating RN: 22-Nov-1932 (89 y.o. F) Primary Care Jimya Ciani: [name] Other Clinician: Referring Jermie Hippe: Treating Kamie Korber/Extender: [name] in Treatment: 7 Edema Assessment Assessed: [Left: No] [Right: No] Edema: [Left: N] [Right: o] Calf Left: Right: Point of Measurement: From Medial Instep 29.5 cm Ankle Left: Right: Point of Measurement: From Medial Instep 17.4 cm Vascular Assessment Extremity colors, hair growth, and conditions: Extremity Color: [Left:Hyperpigmented] Hair Growth on Extremity: [Left:Yes] Temperature of Extremity: [Left:Warm] Capillary Refill: [Left:< 3 seconds] Dependent Rubor: [Left:Yes No] Electronic Signature(s) Signed: 07/11/2023 4:28:55 PM By: Thayer Dallas Entered By: Thayer Dallas on 07/11/2023 13:38:31 -------------------------------------------------------------------------------- Multi-Disciplinary Care Plan Details Patient Name: Date of Service: [place], [name] 07/11/2023 1:15 PM Medical Record Number: [id number] Patient Account Number: [id number] Date of Birth/Sex: Treating RN: Feb 07, 1933 (89 y.o. [name] Primary Care Benito Lemmerman: [name] Other Clinician: Referring  Winton Offord: Treating Kathia Covington/Extender: [name] in Treatment: 7 Multidisciplinary Care Plan reviewed with physician Active Inactive Abuse / Safety / Falls / Self Care Management Nursing Diagnoses: Potential for falls Goals: Patient/caregiver will verbalize/demonstrate measures taken to prevent injury and/or falls Date Initiated: 05/23/2023 Target Resolution Date: 08/20/2023 Goal Status: Active [name], [name] ([id number]) [id number] Page 3 of 10 Interventions: Assess fall risk on admission and as needed Notes: Venous Leg Ulcer Nursing Diagnoses: Knowledge deficit related to disease process and management Potential for venous Insuffiency (use before diagnosis confirmed) Goals: Patient will maintain optimal edema control Date  Initiated: 05/23/2023 Target Resolution Date: 08/20/2023 Goal Status: Active Interventions: Assess peripheral edema status every visit. Compression as ordered Treatment Activities: Therapeutic compression applied : 05/23/2023 Notes: Wound/Skin Impairment Nursing Diagnoses: Impaired tissue integrity Knowledge deficit related to ulceration/compromised skin integrity Goals: Patient/caregiver will verbalize understanding of skin care regimen Date Initiated: 05/23/2023 Target Resolution Date: 08/20/2023 Goal Status: Active Ulcer/skin breakdown will have a volume reduction of 30% by week 4 Date Initiated: 05/23/2023 Target Resolution Date: 08/20/2023 Goal Status: Active Interventions: Assess patient/caregiver ability to obtain necessary supplies Assess patient/caregiver ability to perform ulcer/skin care regimen upon admission and as needed Assess ulceration(s) every visit Provide education on ulcer and skin care Treatment Activities: Skin care regimen initiated : 05/23/2023 Topical wound management initiated : 05/23/2023 Notes: Electronic Signature(s) Signed: 07/11/2023 4:31:50 PM By: Karie Schwalbe RN Entered  By: Karie Schwalbe on 07/11/2023 16:26:54 -------------------------------------------------------------------------------- Pain Assessment Details Patient Name: Date of Service: [place], [name] 07/11/2023 1:15 PM Medical Record Number: [id number] Patient Account Number: [id number] Date of Birth/Sex: Treating RN: 1933/06/14 (89 y.o. F) Primary Care Birdie Fetty: [name] Other Clinician: Referring Lonia Roane: Treating Taniesha Glanz/Extender: [name] in Treatment: 7 Active Problems Location of Pain Severity and Description of Pain Patient Has [name], [name] ([id number]) [id number] Page 4 of 10 Patient Has Paino Yes Site Locations Pain Location: Generalized Pain Rate the pain. Current Pain Level: 3 Pain Management and Medication Current Pain Management: Electronic Signature(s) Signed: 07/11/2023 4:28:55 PM By: Thayer Dallas Entered By: Thayer Dallas on 07/11/2023 13:32:41 -------------------------------------------------------------------------------- Patient/Caregiver Education Details Patient Name: Date of Service: [place], [name] 9/25/2024andnbsp1:15 PM Medical Record Number: [id number] Patient Account Number: [id number] Date of Birth/Gender: Treating RN: 04/17/1933 (89 y.o. [name] Primary Care Physician: [name] Other Clinician: Referring Physician: Treating Physician/Extender: [name] in Treatment: 7 Education Assessment Education Provided To: Patient Education Topics Provided Wound/Skin Impairment: Methods: Explain/Verbal Responses: State content correctly Electronic Signature(s) Signed: 07/11/2023 4:31:50 PM By: Karie Schwalbe RN Entered By: Karie Schwalbe on 07/11/2023 16:27:23 -------------------------------------------------------------------------------- Wound Assessment Details Patient Name: Date of Service: [place], [name] 07/11/2023 1:15  PM Medical Record Number: [id number] Patient Account Number: [id number] Date of Birth/Sex: Treating RN: May 23, 1933 (89 y.o. [address] [place], Foreman [name] ([id number]) [id number] Page 5 of 10 Primary Care Solon Alban: [name] Other Clinician: Referring Zyren Sevigny: Treating Kentley Cedillo/Extender: [name] in Treatment: 7 Wound Status Wound Number: 10 Primary Pressure Ulcer Etiology: Wound Location: Right, Lateral Foot Wound Status: Open Wounding Event: Gradually Appeared Comorbid Cataracts, Hypertension, Raynauds, Rheumatoid Arthritis, Date Acquired: 06/19/2023 History: Osteoarthritis Weeks Of Treatment: 0 Clustered Wound: No Photos Wound Measurements Length: (cm) 0.6 Width: (cm) 0.7 Depth: (cm) 0.2 Area: (cm) 0.33 Volume: (cm) 0.066 % Reduction in Area: % Reduction in Volume: Epithelialization: Small (1-33%) Tunneling: No Undermining: No Wound Description Classification: Category/Stage III Wound Margin: Distinct, outline attached Exudate Amount: Medium Exudate Type: Serosanguineous Exudate Color: red, brown Foul Odor After Cleansing: No Slough/Fibrino Yes Wound Bed Granulation Amount: Small (1-33%) Exposed Structure Granulation Quality: Red Fascia Exposed: No Necrotic Amount: Large (67-100%) Fat Layer (Subcutaneous Tissue) Exposed: Yes Necrotic Quality: Adherent Slough Tendon Exposed: No Muscle Exposed: No Joint Exposed: No Bone Exposed: No Periwound Skin Texture Texture Color No Abnormalities Noted: No No Abnormalities Noted: No Scarring: Yes Temperature / Pain Temperature: No Abnormality Moisture No Abnormalities Noted: Yes Treatment Notes Wound #10 (Foot) Wound Laterality: Right, Lateral Cleanser Soap and Water  Initiated: 05/23/2023 Target Resolution Date: 08/20/2023 Goal Status: Active Interventions: Assess peripheral edema status every visit. Compression as ordered Treatment Activities: Therapeutic compression applied : 05/23/2023 Notes: Wound/Skin Impairment Nursing Diagnoses: Impaired tissue integrity Knowledge deficit related to ulceration/compromised skin integrity Goals: Patient/caregiver will verbalize understanding of skin care regimen Date Initiated: 05/23/2023 Target Resolution Date: 08/20/2023 Goal Status: Active Ulcer/skin breakdown will have a volume reduction of 30% by week 4 Date Initiated: 05/23/2023 Target Resolution Date: 08/20/2023 Goal Status: Active Interventions: Assess patient/caregiver ability to obtain necessary supplies Assess patient/caregiver ability to perform ulcer/skin care regimen upon admission and as needed Assess ulceration(s) every visit Provide education on ulcer and skin care Treatment Activities: Skin care regimen initiated : 05/23/2023 Topical wound management initiated : 05/23/2023 Notes: Electronic Signature(s) Signed: 07/11/2023 4:31:50 PM By: Karie Schwalbe RN Entered  By: Karie Schwalbe on 07/11/2023 16:26:54 -------------------------------------------------------------------------------- Pain Assessment Details Patient Name: Date of Service: [place], [name] 07/11/2023 1:15 PM Medical Record Number: [id number] Patient Account Number: [id number] Date of Birth/Sex: Treating RN: 1933/06/14 (89 y.o. F) Primary Care Birdie Fetty: [name] Other Clinician: Referring Lonia Roane: Treating Taniesha Glanz/Extender: [name] in Treatment: 7 Active Problems Location of Pain Severity and Description of Pain Patient Has [name], [name] ([id number]) [id number] Page 4 of 10 Patient Has Paino Yes Site Locations Pain Location: Generalized Pain Rate the pain. Current Pain Level: 3 Pain Management and Medication Current Pain Management: Electronic Signature(s) Signed: 07/11/2023 4:28:55 PM By: Thayer Dallas Entered By: Thayer Dallas on 07/11/2023 13:32:41 -------------------------------------------------------------------------------- Patient/Caregiver Education Details Patient Name: Date of Service: [place], [name] 9/25/2024andnbsp1:15 PM Medical Record Number: [id number] Patient Account Number: [id number] Date of Birth/Gender: Treating RN: 04/17/1933 (89 y.o. [name] Primary Care Physician: [name] Other Clinician: Referring Physician: Treating Physician/Extender: [name] in Treatment: 7 Education Assessment Education Provided To: Patient Education Topics Provided Wound/Skin Impairment: Methods: Explain/Verbal Responses: State content correctly Electronic Signature(s) Signed: 07/11/2023 4:31:50 PM By: Karie Schwalbe RN Entered By: Karie Schwalbe on 07/11/2023 16:27:23 -------------------------------------------------------------------------------- Wound Assessment Details Patient Name: Date of Service: [place], [name] 07/11/2023 1:15  PM Medical Record Number: [id number] Patient Account Number: [id number] Date of Birth/Sex: Treating RN: May 23, 1933 (89 y.o. [address] [place], Foreman [name] ([id number]) [id number] Page 5 of 10 Primary Care Solon Alban: [name] Other Clinician: Referring Zyren Sevigny: Treating Kentley Cedillo/Extender: [name] in Treatment: 7 Wound Status Wound Number: 10 Primary Pressure Ulcer Etiology: Wound Location: Right, Lateral Foot Wound Status: Open Wounding Event: Gradually Appeared Comorbid Cataracts, Hypertension, Raynauds, Rheumatoid Arthritis, Date Acquired: 06/19/2023 History: Osteoarthritis Weeks Of Treatment: 0 Clustered Wound: No Photos Wound Measurements Length: (cm) 0.6 Width: (cm) 0.7 Depth: (cm) 0.2 Area: (cm) 0.33 Volume: (cm) 0.066 % Reduction in Area: % Reduction in Volume: Epithelialization: Small (1-33%) Tunneling: No Undermining: No Wound Description Classification: Category/Stage III Wound Margin: Distinct, outline attached Exudate Amount: Medium Exudate Type: Serosanguineous Exudate Color: red, brown Foul Odor After Cleansing: No Slough/Fibrino Yes Wound Bed Granulation Amount: Small (1-33%) Exposed Structure Granulation Quality: Red Fascia Exposed: No Necrotic Amount: Large (67-100%) Fat Layer (Subcutaneous Tissue) Exposed: Yes Necrotic Quality: Adherent Slough Tendon Exposed: No Muscle Exposed: No Joint Exposed: No Bone Exposed: No Periwound Skin Texture Texture Color No Abnormalities Noted: No No Abnormalities Noted: No Scarring: Yes Temperature / Pain Temperature: No Abnormality Moisture No Abnormalities Noted: Yes Treatment Notes Wound #10 (Foot) Wound Laterality: Right, Lateral Cleanser Soap and Water  [name], [name] ([id number]) [id number] Page 1 of 10 Visit Report for 07/11/2023 Arrival Information Details Patient Name: Date of Service: [place], [name] 07/11/2023 1:15 PM Medical Record Number: [id number] Patient Account Number: [id number] Date of Birth/Sex: Treating RN: [birth year] (89 y.o. F) Primary Care Labria Wos: [name] Other Clinician: Referring Lissett Favorite: Treating Roniya Tetro/Extender: [name] in Treatment: 7 Visit Information History Since Last Visit Added or deleted any medications: No Patient Arrived: Ambulatory Any new allergies or adverse reactions: No Arrival Time: 13:30 Had a fall or experienced change in No Accompanied By: self activities of daily living that may affect Transfer Assistance: None risk of falls: Patient Identification Verified: Yes Signs or symptoms of abuse/neglect since last visito No Secondary Verification Process Completed: Yes Hospitalized since last visit: No Patient Requires Transmission-Based Precautions: No Implantable device outside of the clinic excluding No Patient Has Alerts: No cellular tissue based products placed in the center since last visit: Has Dressing in Place as Prescribed: Yes Has Compression in Place as Prescribed: Yes Pain Present Now: Yes Electronic Signature(s) Signed: 07/11/2023 4:28:55 PM By: Thayer Dallas Entered By: Thayer Dallas on 07/11/2023 13:32:26 -------------------------------------------------------------------------------- Encounter Discharge Information Details Patient Name: Date of Service: [place], [name] 07/11/2023 1:15 PM Medical Record Number: [id number] Patient Account Number: [id number] Date of Birth/Sex: Treating RN: 1933-09-16 (89 y.o. [name] Primary Care Alvia Jablonski: [name] Other Clinician: Referring Manisha Cancel: Treating Arjen Deringer/Extender: [name] in Treatment: 7 Encounter  Discharge Information Items Discharge Condition: Stable Ambulatory Status: Ambulatory Discharge Destination: Home Transportation: Private Auto Accompanied By: self Schedule Follow-up Appointment: Yes Clinical Summary of Care: Patient Declined Electronic Signature(s) Signed: 07/11/2023 4:31:50 PM By: Karie Schwalbe RN Entered By: Karie Schwalbe on 07/11/2023 16:29:07 [name] ([id number]) [id number].pdf Page 2 of 10 -------------------------------------------------------------------------------- Lower Extremity Assessment Details Patient Name: Date of Service: [place], [name] 07/11/2023 1:15 PM Medical Record Number: [id number] Patient Account Number: [id number] Date of Birth/Sex: Treating RN: 22-Nov-1932 (89 y.o. F) Primary Care Jimya Ciani: [name] Other Clinician: Referring Jermie Hippe: Treating Kamie Korber/Extender: [name] in Treatment: 7 Edema Assessment Assessed: [Left: No] [Right: No] Edema: [Left: N] [Right: o] Calf Left: Right: Point of Measurement: From Medial Instep 29.5 cm Ankle Left: Right: Point of Measurement: From Medial Instep 17.4 cm Vascular Assessment Extremity colors, hair growth, and conditions: Extremity Color: [Left:Hyperpigmented] Hair Growth on Extremity: [Left:Yes] Temperature of Extremity: [Left:Warm] Capillary Refill: [Left:< 3 seconds] Dependent Rubor: [Left:Yes No] Electronic Signature(s) Signed: 07/11/2023 4:28:55 PM By: Thayer Dallas Entered By: Thayer Dallas on 07/11/2023 13:38:31 -------------------------------------------------------------------------------- Multi-Disciplinary Care Plan Details Patient Name: Date of Service: [place], [name] 07/11/2023 1:15 PM Medical Record Number: [id number] Patient Account Number: [id number] Date of Birth/Sex: Treating RN: Feb 07, 1933 (89 y.o. [name] Primary Care Benito Lemmerman: [name] Other Clinician: Referring  Winton Offord: Treating Kathia Covington/Extender: [name] in Treatment: 7 Multidisciplinary Care Plan reviewed with physician Active Inactive Abuse / Safety / Falls / Self Care Management Nursing Diagnoses: Potential for falls Goals: Patient/caregiver will verbalize/demonstrate measures taken to prevent injury and/or falls Date Initiated: 05/23/2023 Target Resolution Date: 08/20/2023 Goal Status: Active [name], [name] ([id number]) [id number] Page 3 of 10 Interventions: Assess fall risk on admission and as needed Notes: Venous Leg Ulcer Nursing Diagnoses: Knowledge deficit related to disease process and management Potential for venous Insuffiency (use before diagnosis confirmed) Goals: Patient will maintain optimal edema control Date  cm) 0 % Reduction in Area: 100% 0 % Reduction in Volume: 100% 0 Epithelialization: Large (67-100%) 0 Tunneling: No 0 Undermining: No Wound Description Classification: Full Thickness Without Exposed Support S Wound Margin: Distinct, outline attached Exudate Amount: None Present tructures Foul Odor After Cleansing: No Slough/Fibrino No Wound Bed Granulation Amount: None Present (0%) Exposed Structure Necrotic Amount: None Present (0%) Fascia Exposed: No Fat Layer (Subcutaneous Tissue) Exposed: No Tendon Exposed: No Muscle Exposed: No Joint Exposed: No Bone Exposed: No Periwound Skin Texture Texture Color No Abnormalities Noted: No No Abnormalities Noted: No Callus: No Atrophie Blanche: No Crepitus: No Cyanosis: No Excoriation: No Ecchymosis: No Induration: No Erythema: No Rash: No Hemosiderin Staining: No Scarring: No Mottled: No Pallor: No Moisture Rubor: No No Abnormalities Noted: Yes Temperature / Pain Temperature: No Abnormality Electronic Signature(s) Signed: 07/11/2023 4:31:50 PM By: Karie Schwalbe RN Entered By: Karie Schwalbe on 07/11/2023 14:01:58 -------------------------------------------------------------------------------- Wound Assessment Details Patient Name: Date of Service: [place], [name] 07/11/2023 1:15 PM Medical Record Number:  [id number] Patient Account Number: [id number] Date of Birth/Sex: Treating RN: 1933-04-23 (89 y.o. F) Primary Care Mayco Walrond: [name] Other Clinician: Referring Adeoluwa Silvers: Treating Shanicka Oldenkamp/Extender: [name] in Treatment: 7 Wound Status Wound Number: 9 Primary Venous Leg Ulcer [name], [name] ([id number]) [id number] Page 9 of 10 Etiology: Wound Location: Left, Anterior Lower Leg Wound Status: Open Wounding Event: Gradually Appeared Comorbid Cataracts, Hypertension, Raynauds, Rheumatoid Arthritis, Date Acquired: 07/08/2023 History: Osteoarthritis Weeks Of Treatment: 0 Clustered Wound: No Photos Wound Measurements Length: (cm) 0.3 Width: (cm) 0.5 Depth: (cm) 0.1 Area: (cm) 0.118 Volume: (cm) 0.012 % Reduction in Area: % Reduction in Volume: Epithelialization: Small (1-33%) Tunneling: No Undermining: No Wound Description Classification: Partial Thickness Wound Margin: Distinct, outline attached Exudate Amount: Medium Exudate Type: Serosanguineous Exudate Color: red, brown Foul Odor After Cleansing: No Slough/Fibrino No Wound Bed Granulation Amount: Large (67-100%) Necrotic Amount: Small (1-33%) Necrotic Quality: Eschar, Adherent Slough Periwound Skin Texture Texture Color No Abnormalities Noted: No No Abnormalities Noted: No Callus: No Atrophie Blanche: No Crepitus: No Cyanosis: No Excoriation: No Ecchymosis: No Induration: No Erythema: No Rash: No Hemosiderin Staining: No Scarring: No Mottled: No Pallor: No Moisture Rubor: No No Abnormalities Noted: No Dry / Scaly: No Maceration: No Treatment Notes Wound #9 (Lower Leg) Wound Laterality: Left, Anterior Cleanser Soap and Water Discharge Instruction: May shower and wash wound with dial antibacterial soap and water prior to dressing change. Peri-Wound Care Sween Lotion (Moisturizing lotion) Discharge Instruction: Apply moisturizing lotion as  directed Topical Primary Dressing PolyMem Non-Adhesive Dressing, 4x4 in Discharge Instruction: Apply to wound bed as instructed Secondary Dressing Woven Gauze Sponge, Non-Sterile 4x4 in Discharge Instruction: Apply over primary dressing as directed. [name], [name] ([id number]) [id number] Page 10 of 10 Secured With Tubigrip Compression Wrap Compression Stockings Add-Ons Electronic Signature(s) Signed: 07/11/2023 4:31:50 PM By: Karie Schwalbe RN Entered By: Karie Schwalbe on 07/11/2023 13:51:55 -------------------------------------------------------------------------------- Vitals Details Patient Name: Date of Service: [place], [name] 07/11/2023 1:15 PM Medical Record Number: [id number] Patient Account Number: [id number] Date of Birth/Sex: Treating RN: October 15, 1933 (89 y.o. F) Primary Care Leverne Tessler: [name] Other Clinician: Referring Laurice Iglesia: Treating Joleena Weisenburger/Extender: [name] in Treatment: 7 Vital Signs Time Taken: 13:38 Temperature (F): 97.9 Height (in): 62 Pulse (bpm): 56 Weight (lbs): 105 Respiratory Rate (breaths/min): 18 Body Mass Index (BMI): 19.2 Blood Pressure (mmHg): 122/69 Reference Range: 80 - 120 mg / dl Electronic Signature(s) Signed: 07/11/2023 4:28:55 PM By:

## 2023-07-12 DIAGNOSIS — K529 Noninfective gastroenteritis and colitis, unspecified: Secondary | ICD-10-CM | POA: Diagnosis not present

## 2023-07-12 DIAGNOSIS — N1831 Chronic kidney disease, stage 3a: Secondary | ICD-10-CM | POA: Diagnosis not present

## 2023-07-12 DIAGNOSIS — N2581 Secondary hyperparathyroidism of renal origin: Secondary | ICD-10-CM | POA: Diagnosis not present

## 2023-07-13 DIAGNOSIS — I87312 Chronic venous hypertension (idiopathic) with ulcer of left lower extremity: Secondary | ICD-10-CM | POA: Diagnosis not present

## 2023-07-16 DIAGNOSIS — M0609 Rheumatoid arthritis without rheumatoid factor, multiple sites: Secondary | ICD-10-CM | POA: Diagnosis not present

## 2023-07-16 DIAGNOSIS — M459 Ankylosing spondylitis of unspecified sites in spine: Secondary | ICD-10-CM | POA: Diagnosis not present

## 2023-07-16 DIAGNOSIS — Z79899 Other long term (current) drug therapy: Secondary | ICD-10-CM | POA: Diagnosis not present

## 2023-07-16 DIAGNOSIS — R5383 Other fatigue: Secondary | ICD-10-CM | POA: Diagnosis not present

## 2023-07-16 DIAGNOSIS — Z111 Encounter for screening for respiratory tuberculosis: Secondary | ICD-10-CM | POA: Diagnosis not present

## 2023-07-18 ENCOUNTER — Encounter (HOSPITAL_BASED_OUTPATIENT_CLINIC_OR_DEPARTMENT_OTHER): Payer: PPO | Attending: Physician Assistant | Admitting: Physician Assistant

## 2023-07-18 DIAGNOSIS — L89893 Pressure ulcer of other site, stage 3: Secondary | ICD-10-CM | POA: Diagnosis not present

## 2023-07-18 DIAGNOSIS — I87332 Chronic venous hypertension (idiopathic) with ulcer and inflammation of left lower extremity: Secondary | ICD-10-CM | POA: Insufficient documentation

## 2023-07-18 DIAGNOSIS — Z79899 Other long term (current) drug therapy: Secondary | ICD-10-CM | POA: Diagnosis not present

## 2023-07-18 DIAGNOSIS — I10 Essential (primary) hypertension: Secondary | ICD-10-CM | POA: Insufficient documentation

## 2023-07-18 DIAGNOSIS — L97822 Non-pressure chronic ulcer of other part of left lower leg with fat layer exposed: Secondary | ICD-10-CM | POA: Diagnosis not present

## 2023-07-18 DIAGNOSIS — S81802A Unspecified open wound, left lower leg, initial encounter: Secondary | ICD-10-CM | POA: Diagnosis not present

## 2023-07-18 DIAGNOSIS — Z961 Presence of intraocular lens: Secondary | ICD-10-CM | POA: Diagnosis not present

## 2023-07-18 DIAGNOSIS — H353131 Nonexudative age-related macular degeneration, bilateral, early dry stage: Secondary | ICD-10-CM | POA: Diagnosis not present

## 2023-07-18 NOTE — Progress Notes (Addendum)
[name], [name] ([id number]) [id number] Page 1 of 9 Visit Report for 07/18/2023 Chief Complaint Document Details Patient Name: Date of Service: [place], [name] 07/18/2023 8:30 A M Medical Record Number: [id number] Patient Account Number: [id number] Date of Birth/Sex: Treating RN: 07/06/1933 (89 y.o. F) Primary Care Provider: [name] Other Clinician: Referring Provider: Treating Provider/Extender: [name] in Treatment: 8 Information Obtained from: Patient Chief Complaint Left LE Ulcer and right lateral foot pressure ulcer Electronic Signature(s) Signed: 07/18/2023 9:21:16 AM By: Allen Derry PA-C Entered By: Allen Derry on 07/18/2023 06:21:16 -------------------------------------------------------------------------------- Debridement Details Patient Name: Date of Service: [place], [name] 07/18/2023 8:30 A M Medical Record Number: [id number] Patient Account Number: [id number] Date of Birth/Sex: Treating RN: Apr 11, 1933 (89 y.o. [name] Primary Care Provider: [name] Other Clinician: Referring Provider: Treating Provider/Extender: [name] in Treatment: 8 Debridement Performed for Assessment: Wound #10 Right,Lateral Foot Performed By: Physician Lenda Kelp, PA The following information was scribed by: Redmond Pulling The information was scribed for: Lenda Kelp Debridement Type: Chemical/Enzymatic/Mechanical Agent Used: gauze, wound cleanser Level of Consciousness (Pre-procedure): Awake and Alert Pre-procedure Verification/Time Out Yes - 09:20 Taken: Start Time: 09:25 Pain Control: Lidocaine 4% Topical Solution Percent of Wound Bed Debrided: Instrument: Other : gauze, wound cleanser Bleeding: None Response to Treatment: Procedure was tolerated well Level of Consciousness (Post- Awake and Alert procedure): Post Debridement Measurements of Total  Wound Length: (cm) 0.6 Stage: Category/Stage III Width: (cm) 0.4 Depth: (cm) 0.2 Volume: (cm) 0.038 Character of Wound/Ulcer Post Debridement: Improved Post Procedure Diagnosis Same as Pre-procedure [name], [name] [name] ([id number]) [phone].pdf Page 2 of 9 Electronic Signature(s) Signed: 07/18/2023 5:35:47 PM By: Allen Derry PA-C Signed: 07/19/2023 4:51:22 PM By: Redmond Pulling RN, BSN Entered By: Redmond Pulling on 07/18/2023 06:42:35 -------------------------------------------------------------------------------- HPI Details Patient Name: Date of Service: [place], [name] 07/18/2023 8:30 A M Medical Record Number: [id number] Patient Account Number: [id number] Date of Birth/Sex: Treating RN: 1933-08-24 (89 y.o. F) Primary Care Provider: [name] Other Clinician: Referring Provider: Treating Provider/Extender: [name] in Treatment: 8 History of Present Illness HPI Description: READMISSION 07/15/2019 Patient is now an 89 year old woman who was previously here in 2016 and 2018. Cared for at the time by Dr. Meyer Russel. Both times with wounds related to trauma in the left leg. She was felt to have underlying venous insufficiency although both occasions were related to trauma. The patient tells me that 2 weeks ago she hit her lateral left leg on the car door. This was a skin tear with a flap for a period of time she has been using Polysporin. The flap came off recently she has a clean looking superficial wound on the left lateral leg. Our intake nurse reported some drainage. Past medical history; includes sensorineural hearing loss, osteoarthritis, hypertension and a hammertoe on the right foot for which she follows with podiatry. ABIs in our clinic were 1.19 on the left 07/21/2019; the patient did not like the wraps. They slid down and rub the wound the wound is measuring larger she is upset. 10/12; left lateral leg wound. Most of this  looks healthy even under illumination. We have been using Hydrofera Blue under border foam. She would not allow compression 10/19; left lateral leg wound. 2 small open areas remain of the original wound. We have been using Hydrofera Blue under border foam. This seems to be making decent progress 10/26  left lateral leg traumatic wound. There is no open wound remaining. We have been using Hydrofera Blue under border foam she arrived with some denuded epithelium that I removed there is no open wound remaining. Is fairly clear she has some degree of chronic venous insufficiency with not a lot of edema but dilated veins in her feet. She reminds me that she also has reactive arthritis and was on prednisone for a prolonged period of time. Nevertheless I think it would be beneficial for her to at least wear support stockings but right now I do not think she is going to agree to the Readmission: 02/18/2020 upon evaluation today patient has sustained a skin tear which occurred about 1 week ago she tells me. Fortunately there does not appear to be any signs of active infection at this time which is excellent news. She has been tolerating the dressing changes without complication. Overall very pleased with where things stand at this point. The only issue that I see here is that the skin flap somewhat folded back and then reattached further down causing an area that is actually bunched up to form where it closed on itself but then reattached on the end of the tissue. Nonetheless I think we have to trim off the bunched up tissue in order to allow this to heal appropriately. That is the only issue I really see today. 03/10/2020 upon evaluation today patient actually appears to be doing excellent at this time. She has healed quite nicely and has been a couple of weeks since I saw her. Overall I feel like she is completely healed and ready for discharge as of today. Readmission: 03-21-2023 upon evaluation today patient  appears to be doing somewhat poorly in regard to the left wound ulcer she tells me has been present for about a month. She tells me that she had this on the metal flatbed shopping carts at [facility] and subsequently has been having issues here with this since she tells me that the original Band-Aid she was using caused this to get worse. With that being said I do not see any evidence of active infection at this time everything seems to really be doing quite well as far as I am concerned. 03-28-2023 upon evaluation today patient appears to be doing well currently in regard to her wounds which in fact appear to be completely healed. I am very pleased with this. Readmission: 05-23-2023 upon evaluation patient presents for reevaluation here in the clinic concerning issues that she has been having with her left anterior lower extremity. This actually appears to have a significant amount of new skin growth over top of the areas of irritation I am not exactly sure what happened here to be perfectly honest. I explained to the patient that this is kind of an unusual presentation for this to be this irritated and yet not have any significant openings at this time. I do not see any evidence of active infection locally or systemically which is great news. 05-30-2023 upon evaluation today patient appears to be doing well currently in regard to her lower extremity wounds which are actually drying out the may be a little bit too dry. I think PolyMem may be better for her and we discussed that today. Fortunately I do not see any signs of active infection locally or systemically which is great news. 06-06-2023 upon evaluation today patient appears to be doing well currently in regard to her wounds. Both are showing signs of improvement which is great news.  Fortunately I do not see any signs of active infection locally or systemically at this time. 8/28; the distal wound on the left leg is healed she still has a comma shaped  proximal wound medially. She reminds as she will be leaving for [place] on September night. The remaining wound is on the right upper medial calf a comma shaped wound this may be closed by that time. We had some discussion about compression stockings [name], [name] ([id number]) [phone].pdf Page 3 of 9 06-20-2023 upon evaluation today patient appears to be doing well currently in regard to her wounds in fact most everything appears to be about healed although she does have some dry skin over top of the distal left lateral leg. The proximal medial leg is very close to closure and seems to be doing better. 07-11-2023 upon evaluation today patient appears to be doing well currently in regard to her wound. She has been tolerating the dressing changes without complication. Fortunately there does not appear to be any signs of infection locally or systemically which is great news. Unfortunately she has been having issues with colitis and she appears to be very pale today she also tells me that she has been feeling fairly poorly. Her rheumatologist to put her on prednisone she is also been put on antibiotics for the colitis. Her wounds on the left anterior lower extremity have reinitiated a little bit here and I am beginning to suspect this may be more autoimmune related. 10-to-24 upon evaluation today patient appears to be doing well currently at this point with regard to her wound. She has been tolerating the dressing changes without complication. Fortunately I do not see any signs of worsening overall I believe that the patient is making good headway towards complete closure which is great news. Still have a little concerned about the possibility of her having some issues here with a rheumatologic or dermatologic issue. I do believe that the steroid ointment has done a little bit better for her the triamcinolone I prescribed last week. Electronic Signature(s) Signed: 07/18/2023  10:00:58 AM By: Allen Derry PA-C Entered By: Allen Derry on 07/18/2023 07:00:57 -------------------------------------------------------------------------------- Physical Exam Details Patient Name: Date of Service: [place], [name] 07/18/2023 8:30 A M Medical Record Number: [id number] Patient Account Number: [id number] Date of Birth/Sex: Treating RN: 12-27-1932 (89 y.o. F) Primary Care Provider: [name] Other Clinician: Referring Provider: Treating Provider/Extender: [name] in Treatment: 8 Constitutional Well-nourished and well-hydrated in no acute distress. Respiratory normal breathing without difficulty. Psychiatric this patient is able to make decisions and demonstrates good insight into disease process. Alert and Oriented x 3. pleasant and cooperative. Notes Upon inspection patient's wound bed actually showed signs of good granulation epithelization at this point. Fortunately I do not see any signs of worsening overall I think that she may have a little bit of improvement though has areas that have shown up new on the leg since last time I saw her. This is still questionable as to what exactly is going on. Electronic Signature(s) Signed: 07/18/2023 10:01:23 AM By: Allen Derry PA-C Entered By: Allen Derry on 07/18/2023 07:01:23 -------------------------------------------------------------------------------- Physician Orders Details Patient Name: Date of Service: [place], [name] 07/18/2023 8:30 A M Medical Record Number: [id number] Patient Account Number: [id number] Date of Birth/Sex: Treating RN: 13-Jul-1933 (89 y.o. [name] Primary Care Provider: [name] Other Clinician: Referring Provider: Treating Provider/Extender: [name] in Treatment: 8 Verbal /  Phone Orders: No Diagnosis Coding ICD-10 Coding Code Description I87.332 Chronic venous hypertension (idiopathic) with ulcer and  inflammation of left lower extremity [name], [name] ([id number]) [phone].pdf Page 4 of 9 [phone] Non-pressure chronic ulcer of other part of left lower leg with fat layer exposed L89.893 Pressure ulcer of other site, stage 3 L97.822 Non-pressure chronic ulcer of other part of left lower leg with fat layer exposed I10 Essential (primary) hypertension Follow-up Appointments ppointment in 1 week. - with Leonard Schwartz Wednesday Room 7 07/25/23 @ 1230 Return A ppointment in 2 weeks. Leonard Schwartz Wednesday room 7 Return A Return appointment in 3 weeks. Leonard Schwartz Wednesday Bathing/ Shower/ Hygiene May shower and wash wound with soap and water. Edema Control - Lymphedema / SCD / Other Elevate legs to the level of the heart or above for 30 minutes daily and/or when sitting for 3-4 times a day throughout the day. Avoid standing for long periods of time. Exercise regularly Wound Treatment Wound #10 - Foot Wound Laterality: Right, Lateral Cleanser: Soap and Water 1 x Per Day/30 Days Discharge Instructions: May shower and wash wound with dial antibacterial soap and water prior to dressing change. Peri-Wound Care: Sween Lotion (Moisturizing lotion) 1 x Per Day/30 Days Discharge Instructions: Apply moisturizing lotion as directed Prim Dressing: PolyMem Non-Adhesive Dressing, 4x4 in (DME) (Generic) 1 x Per Day/30 Days ary Discharge Instructions: Apply to wound bed as instructed Secondary Dressing: Bordered Gauze, 2x3.75 in (Generic) 1 x Per Day/30 Days Discharge Instructions: Apply over primary dressing as directed. Secondary Dressing: Woven Gauze Sponge, Non-Sterile 4x4 in (Generic) 1 x Per Day/30 Days Discharge Instructions: Apply over primary dressing as directed. Wound #7 - Lower Leg Wound Laterality: Left, Anterior, Distal Cleanser: Soap and Water Every Other Day/15 Days Discharge Instructions: May shower and wash wound with dial antibacterial soap and water prior to dressing  change. Peri-Wound Care: Triamcinolone 15 (g) Every Other Day/15 Days Discharge Instructions: Use triamcinolone 15 (g) as directed Peri-Wound Care: Sween Lotion (Moisturizing lotion) Every Other Day/15 Days Discharge Instructions: Apply moisturizing lotion as directed Prim Dressing: PolyMem Non-Adhesive Dressing, 4x4 in (Generic) Every Other Day/15 Days ary Discharge Instructions: Apply to wound bed as instructed Secondary Dressing: Woven Gauze Sponge, Non-Sterile 4x4 in (Generic) Every Other Day/15 Days Discharge Instructions: Apply over primary dressing as directed. Secured With: Tubigrip Every Other Day/15 Days Wound #9 - Lower Leg Wound Laterality: Left, Anterior Cleanser: Soap and Water Every Other Day/15 Days Discharge Instructions: May shower and wash wound with dial antibacterial soap and water prior to dressing change. Peri-Wound Care: Triamcinolone 15 (g) Every Other Day/15 Days Discharge Instructions: Use triamcinolone 15 (g) as directed Peri-Wound Care: Sween Lotion (Moisturizing lotion) Every Other Day/15 Days Discharge Instructions: Apply moisturizing lotion as directed Prim Dressing: PolyMem Non-Adhesive Dressing, 4x4 in (Generic) Every Other Day/15 Days ary Discharge Instructions: Apply to wound bed as instructed Secondary Dressing: Woven Gauze Sponge, Non-Sterile 4x4 in (Generic) Every Other Day/15 Days Discharge Instructions: Apply over primary dressing as directed. Secured With: Tubigrip Every Other Day/15 Days Patient Medications llergies: Sulfa (Sulfonamide Antibiotics), latex A MALEIYA, [name] ([id number]) [phone].pdf Page 5 of 9 Notifications Medication Indication Start End 07/18/2023 lidocaine DOSE topical 4 % cream - cream topical once daily Electronic Signature(s) Signed: 07/19/2023 4:51:22 PM By: Redmond Pulling RN, BSN Signed: 07/20/2023 12:01:05 PM By: Allen Derry PA-C Previous Signature: 07/18/2023 5:35:47 PM Version By: Allen Derry  PA-C Entered By: Redmond Pulling on 07/19/2023 12:29:53 -------------------------------------------------------------------------------- Problem List Details Patient Name: Date of Service: [name] Blazing,  [name] RY [name]. 07/18/2023 8:30 A M Medical Record Number: [id number] Patient Account Number: [id number] Date of Birth/Sex: Treating RN: [birth year] (89 y.o. F) Primary Care Provider: [name] Other Clinician: Referring Provider: Treating Provider/Extender: [name] in Treatment: 8 Active Problems ICD-10 Encounter Code Description Active Date MDM Diagnosis I87.332 Chronic venous hypertension (idiopathic) with ulcer and inflammation of left 05/23/2023 No Yes lower extremity L97.822 Non-pressure chronic ulcer of other part of left lower leg with fat layer exposed8/04/2023 No Yes L89.893 Pressure ulcer of other site, stage 3 07/11/2023 No Yes L97.822 Non-pressure chronic ulcer of other part of left lower leg with fat layer exposed8/04/2023 No Yes I10 Essential (primary) hypertension 05/23/2023 No Yes Inactive Problems Resolved Problems Electronic Signature(s) Signed: 07/18/2023 9:21:10 AM By: Allen Derry PA-C Entered By: Allen Derry on 07/18/2023 06:21:09 Progress Note Details -------------------------------------------------------------------------------- [name] ([id number]) [id number] Page 6 of 9 Patient Name: Date of Service: [place], [name] 07/18/2023 8:30 A M Medical Record Number: [id number] Patient Account Number: [id number] Date of Birth/Sex: Treating RN: 1933-06-18 (89 y.o. F) Primary Care Provider: [name] Other Clinician: Referring Provider: Treating Provider/Extender: [name] in Treatment: 8 Subjective Chief Complaint Information obtained from Patient Left LE Ulcer and right lateral foot pressure ulcer History of Present Illness (HPI) READMISSION 07/15/2019 Patient is now an  89 year old woman who was previously here in 2016 and 2018. Cared for at the time by Dr. Meyer Russel. Both times with wounds related to trauma in the left leg. She was felt to have underlying venous insufficiency although both occasions were related to trauma. The patient tells me that 2 weeks ago she hit her lateral left leg on the car door. This was a skin tear with a flap for a period of time she has been using Polysporin. The flap came off recently she has a clean looking superficial wound on the left lateral leg. Our intake nurse reported some drainage. Past medical history; includes sensorineural hearing loss, osteoarthritis, hypertension and a hammertoe on the right foot for which she follows with podiatry. ABIs in our clinic were 1.19 on the left 07/21/2019; the patient did not like the wraps. They slid down and rub the wound the wound is measuring larger she is upset. 10/12; left lateral leg wound. Most of this looks healthy even under illumination. We have been using Hydrofera Blue under border foam. She would not allow compression 10/19; left lateral leg wound. 2 small open areas remain of the original wound. We have been using Hydrofera Blue under border foam. This seems to be making decent progress 10/26 left lateral leg traumatic wound. There is no open wound remaining. We have been using Hydrofera Blue under border foam she arrived with some denuded epithelium that I removed there is no open wound remaining. Is fairly clear she has some degree of chronic venous insufficiency with not a lot of edema but dilated veins in her feet. She reminds me that she also has reactive arthritis and was on prednisone for a prolonged period of time. Nevertheless I think it would be beneficial for her to at least wear support stockings but right now I do not think she is going to agree to the Readmission: 02/18/2020 upon evaluation today patient has sustained a skin tear which occurred about 1 week ago she  tells me. Fortunately there does not appear to be any signs of active infection at this time which is excellent news. She  has been tolerating the dressing changes without complication. Overall very pleased with where things stand at this point. The only issue that I see here is that the skin flap somewhat folded back and then reattached further down causing an area that is actually bunched up to form where it closed on itself but then reattached on the end of the tissue. Nonetheless I think we have to trim off the bunched up tissue in order to allow this to heal appropriately. That is the only issue I really see today. 03/10/2020 upon evaluation today patient actually appears to be doing excellent at this time. She has healed quite nicely and has been a couple of weeks since I saw her. Overall I feel like she is completely healed and ready for discharge as of today. Readmission: 03-21-2023 upon evaluation today patient appears to be doing somewhat poorly in regard to the left wound ulcer she tells me has been present for about a month. She tells me that she had this on the metal flatbed shopping carts at [facility] and subsequently has been having issues here with this since she tells me that the original Band-Aid she was using caused this to get worse. With that being said I do not see any evidence of active infection at this time everything seems to really be doing quite well as far as I am concerned. 03-28-2023 upon evaluation today patient appears to be doing well currently in regard to her wounds which in fact appear to be completely healed. I am very pleased with this. Readmission: 05-23-2023 upon evaluation patient presents for reevaluation here in the clinic concerning issues that she has been having with her left anterior lower extremity. This actually appears to have a significant amount of new skin growth over top of the areas of irritation I am not exactly sure what happened here to be perfectly  honest. I explained to the patient that this is kind of an unusual presentation for this to be this irritated and yet not have any significant openings at this time. I do not see any evidence of active infection locally or systemically which is great news. 05-30-2023 upon evaluation today patient appears to be doing well currently in regard to her lower extremity wounds which are actually drying out the may be a little bit too dry. I think PolyMem may be better for her and we discussed that today. Fortunately I do not see any signs of active infection locally or systemically which is great news. 06-06-2023 upon evaluation today patient appears to be doing well currently in regard to her wounds. Both are showing signs of improvement which is great news. Fortunately I do not see any signs of active infection locally or systemically at this time. 8/28; the distal wound on the left leg is healed she still has a comma shaped proximal wound medially. She reminds as she will be leaving for [place] on September night. The remaining wound is on the right upper medial calf a comma shaped wound this may be closed by that time. We had some discussion about compression stockings 06-20-2023 upon evaluation today patient appears to be doing well currently in regard to her wounds in fact most everything appears to be about healed although she does have some dry skin over top of the distal left lateral leg. The proximal medial leg is very close to closure and seems to be doing better. 07-11-2023 upon evaluation today patient appears to be doing well currently in regard to her wound.  She has been tolerating the dressing changes without complication. Fortunately there does not appear to be any signs of infection locally or systemically which is great news. Unfortunately she has been having issues with colitis and she appears to be very pale today she also tells me that she has been feeling fairly poorly. Her rheumatologist to  put her on prednisone she is also been put on antibiotics for the colitis. Her wounds on the left anterior lower extremity have reinitiated a little bit here and I am beginning to suspect this may be more autoimmune related. 10-to-24 upon evaluation today patient appears to be doing well currently at this point with regard to her wound. She has been tolerating the dressing changes without complication. Fortunately I do not see any signs of worsening overall I believe that the patient is making good headway towards complete closure which is great news. Still have a little concerned about the possibility of her having some issues here with a rheumatologic or dermatologic issue. I do believe that the steroid ointment has done a little bit better for her the triamcinolone I prescribed last week. [name], [name] ([id number]) [id number] Page 7 of 9 Objective Constitutional Well-nourished and well-hydrated in no acute distress. Vitals Time Taken: 8:58 AM, Height: 62 in, Weight: 105 lbs, BMI: 19.2, Temperature: 97.9 F, Pulse: 54 bpm, Respiratory Rate: 18 breaths/min, Blood Pressure: 132/57 mmHg. Respiratory normal breathing without difficulty. Psychiatric this patient is able to make decisions and demonstrates good insight into disease process. Alert and Oriented x 3. pleasant and cooperative. General Notes: Upon inspection patient's wound bed actually showed signs of good granulation epithelization at this point. Fortunately I do not see any signs of worsening overall I think that she may have a little bit of improvement though has areas that have shown up new on the leg since last time I saw her. This is still questionable as to what exactly is going on. Integumentary (Hair, Skin) Wound #10 status is Open. Original cause of wound was Gradually Appeared. The date acquired was: 06/19/2023. The wound has been in treatment 1 weeks. The wound is located on the Right,Lateral Foot.  The wound measures 0.6cm length x 0.4cm width x 0.2cm depth; 0.188cm^2 area and 0.038cm^3 volume. There is Fat Layer (Subcutaneous Tissue) exposed. There is no tunneling or undermining noted. There is a medium amount of serosanguineous drainage noted. The wound margin is distinct with the outline attached to the wound base. There is small (1-33%) red granulation within the wound bed. There is a large (67-100%) amount of necrotic tissue within the wound bed including Adherent Slough. The periwound skin appearance had no abnormalities noted for moisture. The periwound skin appearance exhibited: Scarring. Periwound temperature was noted as No Abnormality. Wound #7 status is Open. Original cause of wound was Trauma. The date acquired was: 02/23/2023. The wound has been in treatment 8 weeks. The wound is located on the [facility] Lower Leg. The wound measures 0.3cm length x 0.2cm width x 0.1cm depth; 0.047cm^2 area and 0.005cm^3 volume. There is Fat Layer (Subcutaneous Tissue) exposed. There is no tunneling or undermining noted. There is a none present amount of drainage noted. The wound margin is flat and intact. There is no granulation within the wound bed. There is a large (67-100%) amount of necrotic tissue within the wound bed including Eschar and Adherent Slough. The periwound skin appearance had no abnormalities noted for texture. The periwound skin appearance exhibited: Maceration, Hemosiderin Staining. Periwound temperature was noted as No Abnormality.  The periwound has tenderness on palpation. Wound #9 status is Open. Original cause of wound was Gradually Appeared. The date acquired was: 07/08/2023. The wound has been in treatment 1 weeks. The wound is located on the Left,Anterior Lower Leg. The wound measures 0.2cm length x 0.2cm width x 0.1cm depth; 0.031cm^2 area and 0.003cm^3 volume. There is no tunneling or undermining noted. There is a none present amount of drainage noted. The wound  margin is distinct with the outline attached to the wound base. There is no granulation within the wound bed. There is a large (67-100%) amount of necrotic tissue within the wound bed including Eschar and Adherent Slough. The periwound skin appearance did not exhibit: Callus, Crepitus, Excoriation, Induration, Rash, Scarring, Dry/Scaly, Maceration, Atrophie Blanche, Cyanosis, Ecchymosis, Hemosiderin Staining, Mottled, Pallor, Rubor, Erythema. Assessment Active Problems ICD-10 Chronic venous hypertension (idiopathic) with ulcer and inflammation of left lower extremity Non-pressure chronic ulcer of other part of left lower leg with fat layer exposed Pressure ulcer of other site, stage 3 Non-pressure chronic ulcer of other part of left lower leg with fat layer exposed Essential (primary) hypertension Procedures Wound #10 Pre-procedure diagnosis of Wound #10 is a Pressure Ulcer located on the Right,Lateral Foot . There was a Chemical/Enzymatic/Mechanical debridement performed by Lenda Kelp, PA. With the following instrument(s): gauze, wound cleanser after achieving pain control using Lidocaine 4% Topical Solution. Other agent used was gauze, wound cleanser. A time out was conducted at 09:20, prior to the start of the procedure. There was no bleeding. The procedure was tolerated well. Post Debridement Measurements: 0.6cm length x 0.4cm width x 0.2cm depth; 0.038cm^3 volume. Post debridement Stage noted as Category/Stage III. Character of Wound/Ulcer Post Debridement is improved. Post procedure Diagnosis Wound #10: Same as Pre-Procedure Plan Follow-up Appointments: [name], [name] ([id number]) [phone].pdf Page 8 of 9 Return Appointment in 1 week. - with Leonard Schwartz Wednesday Room 7 07/25/23 @ 1230 Return Appointment in 2 weeks. Leonard Schwartz Wednesday room 7 Return appointment in 3 weeks. Leonard Schwartz Wednesday Bathing/ Shower/ Hygiene: May shower and wash wound with soap and  water. Edema Control - Lymphedema / SCD / Other: Elevate legs to the level of the heart or above for 30 minutes daily and/or when sitting for 3-4 times a day throughout the day. Avoid standing for long periods of time. Exercise regularly The following medication(s) was prescribed: lidocaine topical 4 % cream cream topical once daily was prescribed at facility WOUND #10: - Foot Wound Laterality: Right, Lateral Cleanser: Soap and Water 1 x Per Day/30 Days Discharge Instructions: May shower and wash wound with dial antibacterial soap and water prior to dressing change. Peri-Wound Care: Sween Lotion (Moisturizing lotion) 1 x Per Day/30 Days Discharge Instructions: Apply moisturizing lotion as directed Prim Dressing: PolyMem Non-Adhesive Dressing, 4x4 in (DME) (Generic) 1 x Per Day/30 Days ary Discharge Instructions: Apply to wound bed as instructed Secondary Dressing: Bordered Gauze, 2x3.75 in (Generic) 1 x Per Day/30 Days Discharge Instructions: Apply over primary dressing as directed. Secondary Dressing: Woven Gauze Sponge, Non-Sterile 4x4 in (Generic) 1 x Per Day/30 Days Discharge Instructions: Apply over primary dressing as directed. WOUND #7: - Lower Leg Wound Laterality: Left, Anterior, Distal Cleanser: Soap and Water Every Other Day/15 Days Discharge Instructions: May shower and wash wound with dial antibacterial soap and water prior to dressing change. Peri-Wound Care: Triamcinolone 15 (g) Every Other Day/15 Days Discharge Instructions: Use triamcinolone 15 (g) as directed Peri-Wound Care: Sween Lotion (Moisturizing lotion) Every Other Day/15 Days Discharge Instructions: Apply  moisturizing lotion as directed Prim Dressing: PolyMem Non-Adhesive Dressing, 4x4 in (Generic) Every Other Day/15 Days ary Discharge Instructions: Apply to wound bed as instructed Secondary Dressing: Woven Gauze Sponge, Non-Sterile 4x4 in (Generic) Every Other Day/15 Days Discharge Instructions: Apply over  primary dressing as directed. Secured With: Tubigrip Every Other Day/15 Days WOUND #9: - Lower Leg Wound Laterality: Left, Anterior Cleanser: Soap and Water Every Other Day/15 Days Discharge Instructions: May shower and wash wound with dial antibacterial soap and water prior to dressing change. Peri-Wound Care: Triamcinolone 15 (g) Every Other Day/15 Days Discharge Instructions: Use triamcinolone 15 (g) as directed Peri-Wound Care: Sween Lotion (Moisturizing lotion) Every Other Day/15 Days Discharge Instructions: Apply moisturizing lotion as directed Prim Dressing: PolyMem Non-Adhesive Dressing, 4x4 in (Generic) Every Other Day/15 Days ary Discharge Instructions: Apply to wound bed as instructed Secondary Dressing: Woven Gauze Sponge, Non-Sterile 4x4 in (Generic) Every Other Day/15 Days Discharge Instructions: Apply over primary dressing as directed. Secured With: Tubigrip Every Other Day/15 Days 1. I would recommend that the patient continue with the wound care measures as before using the triamcinolone and PolyMem to the left leg. Return I would recommend the PolyMem be continued to the right leg. She tells me that this feels kind of tight in her shoe and I explained to the patient that she does need to actually get a wider shoes she tells me this is the [place] when she has. I think that she is going to have to break down and buy a new pair shoes that are going to be a bit wider. 2. I am good recommend as well that the patient should continue with offloading is much she can again this is a side of her foot not really a plantar aspect I think issues of the primary issue here. We will see patient back for reevaluation in 1 week here in the clinic. If anything worsens or changes patient will contact our office for additional recommendations. Electronic Signature(s) Signed: 07/20/2023 10:08:59 AM By: Shawn Stall RN, BSN Signed: 07/20/2023 12:01:05 PM By: Allen Derry PA-C Previous Signature:  07/18/2023 10:02:08 AM Version By: Allen Derry PA-C Entered By: Shawn Stall on 07/20/2023 07:07:52 -------------------------------------------------------------------------------- SuperBill Details Patient Name: Date of Service: [place], [name] 07/18/2023 Medical Record Number: [id number] Patient Account Number: [id number] Date of Birth/Sex: Treating RN: 1932-12-06 (89 y.o. F) Primary Care Provider: [name] Other Clinician: Referring Provider: Treating Provider/Extender: [name] in Treatment: 8 Diagnosis [address] [place] [name] ([id number]) [id number] Page 9 of 9 ICD-10 Codes Code Description [phone] Chronic venous hypertension (idiopathic) with ulcer and inflammation of left lower extremity L97.822 Non-pressure chronic ulcer of other part of left lower leg with fat layer exposed L89.893 Pressure ulcer of other site, stage 3 L97.822 Non-pressure chronic ulcer of other part of left lower leg with fat layer exposed I10 Essential (primary) hypertension Facility Procedures : CPT4 Code: [id number] Description: 32440 - DEBRIDE W/O ANES NON SELECT Modifier: Quantity: 1 Physician Procedures : CPT4 Code Description Modifier [id number] - WC PHYS LEVEL 3 - EST PT ICD-10 Diagnosis Description I87.332 Chronic venous hypertension (idiopathic) with ulcer and inflammation of left lower extremity L97.822 Non-pressure chronic ulcer of other part  of left lower leg with fat layer exposed L89.893 Pressure ulcer of other site, stage 3 I10 Essential (primary) hypertension Quantity: 1 Electronic Signature(s) Signed: 07/18/2023 10:03:19 AM By: Allen Derry PA-C Entered By: Allen Derry on 07/18/2023 07:03:19

## 2023-07-19 NOTE — Progress Notes (Signed)
[name], [name] ([id number]) [id number] Page 1 of 9 Visit Report for 07/18/2023 Arrival Information Details Patient Name: Date of Service: [name], [name] 07/18/2023 8:30 A M Medical Record Number: [id number] Patient Account Number: [id number] Date of Birth/Sex: Treating RN: November 19, 1932 (89 y.o. [name] Primary Care Phoenyx Melka: [name] Other Clinician: Referring Albina Gosney: Treating Ajahni Nay/Extender: [name] in Treatment: 8 Visit Information History Since Last Visit Added or deleted any medications: No Patient Arrived: Ambulatory Any new allergies or adverse reactions: No Arrival Time: 08:59 Had a fall or experienced change in No Accompanied By: Self activities of daily living that may affect Transfer Assistance: None risk of falls: Patient Identification Verified: Yes Signs or symptoms of abuse/neglect since last visito No Secondary Verification Process Completed: Yes Hospitalized since last visit: No Patient Requires Transmission-Based Precautions: No Implantable device outside of the clinic excluding No Patient Has Alerts: No cellular tissue based products placed in the center since last visit: Has Dressing in Place as Prescribed: Yes Has Compression in Place as Prescribed: Yes Pain Present Now: No Electronic Signature(s) Signed: 07/19/2023 4:51:22 PM By: Redmond Pulling RN, BSN Entered By: Redmond Pulling on 07/18/2023 05:59:46 -------------------------------------------------------------------------------- Encounter Discharge Information Details Patient Name: Date of Service: [place], [name] 07/18/2023 8:30 A M Medical Record Number: [id number] Patient Account Number: [id number] Date of Birth/Sex: Treating RN: January 03, 1933 (89 y.o. [name] Primary Care Alva Broxson: [name] Other Clinician: Referring Candis Kabel: Treating Leilanny Fluitt/Extender: [name] in  Treatment: 8 Encounter Discharge Information Items Post Procedure Vitals Discharge Condition: Stable Temperature (F): 97.9 Ambulatory Status: Ambulatory Pulse (bpm): 54 Discharge Destination: Home Respiratory Rate (breaths/min): 18 Transportation: Private Auto Blood Pressure (mmHg): 132/57 Accompanied By: self Schedule Follow-up Appointment: Yes Clinical Summary of Care: Patient Declined Electronic Signature(s) Signed: 07/19/2023 4:51:22 PM By: Redmond Pulling RN, BSN Entered By: Redmond Pulling on 07/18/2023 06:44:27 [name] ([id number]) [id number] Page 2 of 9 -------------------------------------------------------------------------------- Lower Extremity Assessment Details Patient Name: Date of Service: [place], [name] 07/18/2023 8:30 A M Medical Record Number: [id number] Patient Account Number: [id number] Date of Birth/Sex: Treating RN: [birth year] (89 y.o. [name] Primary Care Azuri Bozard: [name] Other Clinician: Referring Giankarlo Leamer: Treating Sudie Bandel/Extender: [name] in Treatment: 8 Edema Assessment Assessed: [Left: No] [Right: No] Edema: [Left: N] [Right: o] Calf Left: Right: Point of Measurement: From Medial Instep 29 cm 28 cm Ankle Left: Right: Point of Measurement: From Medial Instep 17 cm 16.8 cm Vascular Assessment Pulses: Dorsalis Pedis Palpable: [Left:Yes] [Right:Yes] Extremity colors, hair growth, and conditions: Extremity Color: [Left:Hyperpigmented] Hair Growth on Extremity: [Left:Yes] Temperature of Extremity: [Left:Warm] Capillary Refill: [Left:< 3 seconds] Dependent Rubor: [Left:Yes No] Electronic Signature(s) Signed: 07/19/2023 4:51:22 PM By: Redmond Pulling RN, BSN Entered By: Redmond Pulling on 07/18/2023 06:02:03 -------------------------------------------------------------------------------- Multi-Disciplinary Care Plan Details Patient Name: Date of Service: [place], [place]. 07/18/2023 8:30 A M Medical Record Number: [id number] Patient Account Number: [id number] Date of Birth/Sex: Treating RN: 08-11-1933 (89 y.o. [name] Primary Care Jamar Casagrande: [name] Other Clinician: Referring Jasneet Schobert: Treating Donise Woodle/Extender: [name] in Treatment: 8 Multidisciplinary Care Plan reviewed with physician Active Inactive Abuse / Safety / Falls / Self Care Management Nursing Diagnoses: Potential for falls Goals: TEARIA, [name] ([id number]) [id number] Page 3 of 9 Patient/caregiver will verbalize/demonstrate measures taken to prevent injury and/or falls Date Initiated: 05/23/2023 Target Resolution Date: 08/20/2023 Goal Status:  Active Interventions: Assess fall risk on admission and as needed Notes: Venous Leg Ulcer Nursing Diagnoses: Knowledge deficit related to disease process and management Potential for venous Insuffiency (use before diagnosis confirmed) Goals: Patient will maintain optimal edema control Date Initiated: 05/23/2023 Target Resolution Date: 08/20/2023 Goal Status: Active Interventions: Assess peripheral edema status every visit. Compression as ordered Treatment Activities: Therapeutic compression applied : 05/23/2023 Notes: Wound/Skin Impairment Nursing Diagnoses: Impaired tissue integrity Knowledge deficit related to ulceration/compromised skin integrity Goals: Patient/caregiver will verbalize understanding of skin care regimen Date Initiated: 05/23/2023 Target Resolution Date: 08/20/2023 Goal Status: Active Ulcer/skin breakdown will have a volume reduction of 30% by week 4 Date Initiated: 05/23/2023 Target Resolution Date: 08/20/2023 Goal Status: Active Interventions: Assess patient/caregiver ability to obtain necessary supplies Assess patient/caregiver ability to perform ulcer/skin care regimen upon admission and as needed Assess ulceration(s) every visit Provide  education on ulcer and skin care Treatment Activities: Skin care regimen initiated : 05/23/2023 Topical wound management initiated : 05/23/2023 Notes: Electronic Signature(s) Signed: 07/19/2023 4:51:22 PM By: Redmond Pulling RN, BSN Entered By: Redmond Pulling on 07/18/2023 06:23:50 -------------------------------------------------------------------------------- Pain Assessment Details Patient Name: Date of Service: [place], [name] 07/18/2023 8:30 A M Medical Record Number: [id number] Patient Account Number: [id number] Date of Birth/Sex: Treating RN: Jan 23, 1933 (89 y.o. [name] Primary Care Brandolyn Shortridge: [name] Other Clinician: Referring Shariece Viveiros: Treating Itxel Wickard/Extender: [name] in Treatment: [address] ANGILA, WOMBLES ([id number]) [id number] Page 4 of 9 Location of Pain Severity and Description of Pain Patient Has Paino No Site Locations Pain Management and Medication Current Pain Management: Electronic Signature(s) Signed: 07/19/2023 4:51:22 PM By: Redmond Pulling RN, BSN Entered By: Redmond Pulling on 07/18/2023 06:00:57 -------------------------------------------------------------------------------- Patient/Caregiver Education Details Patient Name: Date of Service: [place], [name] 10/2/2024andnbsp8:30 A M Medical Record Number: [id number] Patient Account Number: [id number] Date of Birth/Gender: Treating RN: 1933-05-01 (89 y.o. [name] Primary Care Physician: [name] Other Clinician: Referring Physician: Treating Physician/Extender: [name] in Treatment: 8 Education Assessment Education Provided To: Patient Education Topics Provided Wound/Skin Impairment: Methods: Explain/Verbal Responses: State content correctly Electronic Signature(s) Signed: 07/19/2023 4:51:22 PM By: Redmond Pulling RN, BSN Entered By: Redmond Pulling on 07/18/2023  06:24:11 Wound Assessment Details -------------------------------------------------------------------------------- [name] ([id number]) [id number] Page 5 of 9 Patient Name: Date of Service: [place], [name] 07/18/2023 8:30 A M Medical Record Number: [id number] Patient Account Number: [id number] Date of Birth/Sex: Treating RN: [birth year] (89 y.o. [name] Primary Care Adler Chartrand: [name] Other Clinician: Referring Baron Parmelee: Treating Ersa Delaney/Extender: [name] in Treatment: 8 Wound Status Wound Number: 10 Primary Pressure Ulcer Etiology: Wound Location: Right, Lateral Foot Wound Status: Open Wounding Event: Gradually Appeared Comorbid Cataracts, Hypertension, Raynauds, Rheumatoid Arthritis, Date Acquired: 06/19/2023 History: Osteoarthritis Weeks Of Treatment: 1 Clustered Wound: No Photos Wound Measurements Length: (cm) 0.6 % Reduction in Area: 43% Width: (cm) 0.4 % Reduction in Volume: 42.4% Depth: (cm) 0.2 Epithelialization: Small (1-33%) Area: (cm) 0.188 Tunneling: No Volume: (cm) 0.038 Undermining: No Wound Description Classification: Category/Stage III Foul Odor After Cleansing: No Wound Margin: Distinct, outline attached Slough/Fibrino Yes Exudate Amount: Medium Exudate Type: Serosanguineous Exudate Color: red, brown Wound Bed Granulation Amount: Small (1-33%) Exposed Structure Granulation Quality: Red Fascia Exposed: No Necrotic Amount: Large (67-100%) Fat Layer (Subcutaneous Tissue) Exposed: Yes Necrotic Quality: Adherent Slough Tendon Exposed: No Muscle Exposed: No Joint Exposed: No Bone Exposed: No Periwound Skin Texture  Texture Color No Abnormalities Noted: No No Abnormalities Noted: No Scarring: Yes Temperature / Pain Temperature: No Abnormality Moisture No Abnormalities Noted: Yes Treatment Notes Wound #10 (Foot) Wound Laterality: Right, Lateral Cleanser Soap and  Water Discharge Instruction: May shower and wash wound with dial antibacterial soap and water prior to dressing change. Peri-Wound Care Sween Lotion (Moisturizing lotion) Discharge Instruction: Apply moisturizing lotion as directed Topical [name], [name] ([id number]) [id number] Page 6 of 9 Primary Dressing PolyMem Non-Adhesive Dressing, 4x4 in Discharge Instruction: Apply to wound bed as instructed Secondary Dressing Bordered Gauze, 2x3.75 in Discharge Instruction: Apply over primary dressing as directed. Woven Gauze Sponge, Non-Sterile 4x4 in Discharge Instruction: Apply over primary dressing as directed. Secured With Compression Wrap Compression Stockings Facilities manager) Signed: 07/19/2023 4:51:22 PM By: Redmond Pulling RN, BSN Entered By: Redmond Pulling on 07/18/2023 06:06:52 -------------------------------------------------------------------------------- Wound Assessment Details Patient Name: Date of Service: [place], [name] 07/18/2023 8:30 A M Medical Record Number: [id number] Patient Account Number: [id number] Date of Birth/Sex: Treating RN: Apr 26, 1933 (89 y.o. [name] Primary Care Lucie Friedlander: [name] Other Clinician: Referring Nirvi Boehler: Treating Daliana Leverett/Extender: [name] in Treatment: 8 Wound Status Wound Number: 7 Primary Venous Leg Ulcer Etiology: Wound Location: Left, Distal, Anterior Lower Leg Wound Status: Open Wounding Event: Trauma Comorbid Cataracts, Hypertension, Raynauds, Rheumatoid Arthritis, Date Acquired: 02/23/2023 History: Osteoarthritis Weeks Of Treatment: 8 Clustered Wound: No Photos Wound Measurements Length: (cm) 0.3 Width: (cm) 0.2 Depth: (cm) 0.1 Area: (cm) 0.047 Volume: (cm) 0.005 % Reduction in Area: 99.7% % Reduction in Volume: 99.6% Epithelialization: Large (67-100%) Tunneling: No Undermining: No Wound Description Classification: Full  Thickness Without Exposed Support Structures Wound Margin: Flat and Intact Exudate Amount: None Present Foul Odor After Cleansing: No Slough/Fibrino Yes Wound Bed [name], [name] ([id number]) [id number] Page 7 of 9 Granulation Amount: None Present (0%) Exposed Structure Necrotic Amount: Large (67-100%) Fascia Exposed: No Necrotic Quality: Eschar, Adherent Slough Fat Layer (Subcutaneous Tissue) Exposed: Yes Tendon Exposed: No Muscle Exposed: No Joint Exposed: No Bone Exposed: No Periwound Skin Texture Texture Color No Abnormalities Noted: Yes No Abnormalities Noted: No Hemosiderin Staining: Yes Moisture No Abnormalities Noted: No Temperature / Pain Maceration: Yes Temperature: No Abnormality Tenderness on Palpation: Yes Treatment Notes Wound #7 (Lower Leg) Wound Laterality: Left, Anterior, Distal Cleanser Soap and Water Discharge Instruction: May shower and wash wound with dial antibacterial soap and water prior to dressing change. Peri-Wound Care Triamcinolone 15 (g) Discharge Instruction: Use triamcinolone 15 (g) as directed Sween Lotion (Moisturizing lotion) Discharge Instruction: Apply moisturizing lotion as directed Topical Primary Dressing PolyMem Non-Adhesive Dressing, 4x4 in Discharge Instruction: Apply to wound bed as instructed Secondary Dressing Woven Gauze Sponge, Non-Sterile 4x4 in Discharge Instruction: Apply over primary dressing as directed. Secured With Tubigrip Compression Wrap Compression Stockings Facilities manager) Signed: 07/19/2023 4:51:22 PM By: Redmond Pulling RN, BSN Entered By: Redmond Pulling on 07/18/2023 06:10:12 -------------------------------------------------------------------------------- Wound Assessment Details Patient Name: Date of Service: [place], [name] 07/18/2023 8:30 A M Medical Record Number: [id number] Patient Account Number: [id number] Date of Birth/Sex: Treating RN: [birth year] (89 y.o.  [name] Primary Care Jesua Tamblyn: [name] Other Clinician: Referring Areil Ottey: Treating [place]/Extender: [name] in Treatment: 8 Wound Status Wound Number: 9 Primary Venous Leg Ulcer Etiology: Wound Location: Left, Anterior Lower Leg Wound Status: Open Wounding Event: Gradually Appeared Comorbid Cataracts, Hypertension, Raynauds, Rheumatoid Arthritis, Date Acquired: 07/08/2023 History: Osteoarthritis [name], [name] ([id number]) [id number] Page 8  of 9 History: Osteoarthritis Weeks Of Treatment: 1 Clustered Wound: No Photos Wound Measurements Length: (cm) 0.2 Width: (cm) 0.2 Depth: (cm) 0.1 Area: (cm) 0.031 Volume: (cm) 0.003 % Reduction in Area: 73.7% % Reduction in Volume: 75% Epithelialization: Large (67-100%) Tunneling: No Undermining: No Wound Description Classification: Partial Thickness Wound Margin: Distinct, outline attached Exudate Amount: None Present Foul Odor After Cleansing: No Slough/Fibrino Yes Wound Bed Granulation Amount: None Present (0%) Necrotic Amount: Large (67-100%) Necrotic Quality: Eschar, Adherent Slough Periwound Skin Texture Texture Color No Abnormalities Noted: No No Abnormalities Noted: No Callus: No Atrophie Blanche: No Crepitus: No Cyanosis: No Excoriation: No Ecchymosis: No Induration: No Erythema: No Rash: No Hemosiderin Staining: No Scarring: No Mottled: No Pallor: No Moisture Rubor: No No Abnormalities Noted: No Dry / Scaly: No Maceration: No Treatment Notes Wound #9 (Lower Leg) Wound Laterality: Left, Anterior Cleanser Soap and Water Discharge Instruction: May shower and wash wound with dial antibacterial soap and water prior to dressing change. Peri-Wound Care Triamcinolone 15 (g) Discharge Instruction: Use triamcinolone 15 (g) as directed Sween Lotion (Moisturizing lotion) Discharge Instruction: Apply moisturizing lotion as  directed Topical Primary Dressing PolyMem Non-Adhesive Dressing, 4x4 in Discharge Instruction: Apply to wound bed as instructed Secondary Dressing Woven Gauze Sponge, Non-Sterile 4x4 in Discharge Instruction: Apply over primary dressing as directed. Secured With [place], [name] ([id number]) [id number] Page 9 of 9 Tubigrip Compression Wrap Compression Stockings Add-Ons Electronic Signature(s) Signed: 07/19/2023 4:51:22 PM By: Redmond Pulling RN, BSN Entered By: Redmond Pulling on 07/18/2023 06:11:10 -------------------------------------------------------------------------------- Vitals Details Patient Name: Date of Service: [place], [name] 07/18/2023 8:30 A M Medical Record Number: [id number] Patient Account Number: [id number] Date of Birth/Sex: Treating RN: [birth year] (89 y.o. [name] Primary Care Maks Cavallero: [name] Other Clinician: Referring Pearley Millington: Treating Eleazar Kimmey/Extender: [name] in Treatment: 8 Vital Signs Time Taken: 08:58 Temperature (F): 97.9 Height (in): 62 Pulse (bpm): 54 Weight (lbs): 105 Respiratory Rate (breaths/min): 18 Body Mass Index (BMI): 19.2 Blood Pressure (mmHg): 132/57 Reference Range: 80 - 120 mg / dl Electronic Signature(s) Signed: 07/19/2023 4:51:22 PM By: Redmond Pulling RN, BSN Entered By: Redmond Pulling on 07/18/2023 06:00:09

## 2023-07-24 NOTE — Progress Notes (Signed)
ok 

## 2023-07-25 ENCOUNTER — Encounter (HOSPITAL_BASED_OUTPATIENT_CLINIC_OR_DEPARTMENT_OTHER): Payer: PPO | Admitting: Physician Assistant

## 2023-07-25 DIAGNOSIS — L97822 Non-pressure chronic ulcer of other part of left lower leg with fat layer exposed: Secondary | ICD-10-CM | POA: Diagnosis not present

## 2023-07-25 DIAGNOSIS — L89893 Pressure ulcer of other site, stage 3: Secondary | ICD-10-CM | POA: Diagnosis not present

## 2023-07-25 DIAGNOSIS — S81802A Unspecified open wound, left lower leg, initial encounter: Secondary | ICD-10-CM | POA: Diagnosis not present

## 2023-07-25 NOTE — Progress Notes (Addendum)
[name], [name] ([id number]) [id number] Page 1 of 9 Visit Report for 07/25/2023 Chief Complaint Document Details Patient Name: Date of Service: [place], [name] 07/25/2023 12:30 PM Medical Record Number: [id number] Patient Account Number: [id number] Date of Birth/Sex: Treating RN: 11/24/1932 (89 y.o. F) Primary Care Provider: [name] Other Clinician: Referring Provider: Treating Provider/Extender: [name] in Treatment: 9 Information Obtained from: Patient Chief Complaint Left LE Ulcer and right lateral foot pressure ulcer Electronic Signature(s) Signed: 07/25/2023 1:16:14 PM By: Allen Derry PA-C Entered By: Allen Derry on 07/25/2023 13:16:13 -------------------------------------------------------------------------------- HPI Details Patient Name: Date of Service: [place], [name] 07/25/2023 12:30 PM Medical Record Number: [id number] Patient Account Number: [id number] Date of Birth/Sex: Treating RN: [birth year] (89 y.o. F) Primary Care Provider: [name] Other Clinician: Referring Provider: Treating Provider/Extender: [name] in Treatment: 9 History of Present Illness HPI Description: READMISSION 07/15/2019 Patient is now an 89 year old woman who was previously here in 2016 and 2018. Cared for at the time by Dr. Meyer Russel. Both times with wounds related to trauma in the left leg. She was felt to have underlying venous insufficiency although both occasions were related to trauma. The patient tells me that 2 weeks ago she hit her lateral left leg on the car door. This was a skin tear with a flap for a period of time she has been using Polysporin. The flap came off recently she has a clean looking superficial wound on the left lateral leg. Our intake nurse reported some drainage. Past medical history; includes sensorineural hearing loss, osteoarthritis, hypertension and a hammertoe on the  right foot for which she follows with podiatry. ABIs in our clinic were 1.19 on the left 07/21/2019; the patient did not like the wraps. They slid down and rub the wound the wound is measuring larger she is upset. 10/12; left lateral leg wound. Most of this looks healthy even under illumination. We have been using Hydrofera Blue under border foam. She would not allow compression 10/19; left lateral leg wound. 2 small open areas remain of the original wound. We have been using Hydrofera Blue under border foam. This seems to be making decent progress 10/26 left lateral leg traumatic wound. There is no open wound remaining. We have been using Hydrofera Blue under border foam she arrived with some denuded epithelium that I removed there is no open wound remaining. Is fairly clear she has some degree of chronic venous insufficiency with not a lot of edema but dilated veins in her feet. She reminds me that she also has reactive arthritis and was on prednisone for a prolonged period of time. Nevertheless I think it would be beneficial for her to at least wear support stockings but right now I do not think she is going to agree to the Readmission: 02/18/2020 upon evaluation today patient has sustained a skin tear which occurred about 1 week ago she tells me. Fortunately there does not appear to be any signs of active infection at this time which is excellent news. She has been tolerating the dressing changes without complication. Overall very pleased with where things stand at this point. The only issue that I see here is that the skin flap somewhat folded back and then reattached further down causing an area that is actually bunched up to form where it closed on itself but then reattached on the end of the tissue. Nonetheless I think we have to trim off the bunched  up tissue in order to allow this to heal appropriately. That is the only issue I really see today. 03/10/2020 upon evaluation today patient  actually appears to be doing excellent at this time. She has healed quite nicely and has been a couple of weeks since [place]a ([id number]) [id number] Page 2 of 9 I saw her. Overall I feel like she is completely healed and ready for discharge as of today. Readmission: 03-21-2023 upon evaluation today patient appears to be doing somewhat poorly in regard to the left wound ulcer she tells me has been present for about a month. She tells me that she had this on the metal flatbed shopping carts at [facility] and subsequently has been having issues here with this since she tells me that the original Band-Aid she was using caused this to get worse. With that being said I do not see any evidence of active infection at this time everything seems to really be doing quite well as far as I am concerned. 03-28-2023 upon evaluation today patient appears to be doing well currently in regard to her wounds which in fact appear to be completely healed. I am very pleased with this. Readmission: 05-23-2023 upon evaluation patient presents for reevaluation here in the clinic concerning issues that she has been having with her left anterior lower extremity. This actually appears to have a significant amount of new skin growth over top of the areas of irritation I am not exactly sure what happened here to be perfectly honest. I explained to the patient that this is kind of an unusual presentation for this to be this irritated and yet not have any significant openings at this time. I do not see any evidence of active infection locally or systemically which is great news. 05-30-2023 upon evaluation today patient appears to be doing well currently in regard to her lower extremity wounds which are actually drying out the may be a little bit too dry. I think PolyMem may be better for her and we discussed that today. Fortunately I do not see any signs of active infection locally or systemically which is  great news. 06-06-2023 upon evaluation today patient appears to be doing well currently in regard to her wounds. Both are showing signs of improvement which is great news. Fortunately I do not see any signs of active infection locally or systemically at this time. 8/28; the distal wound on the left leg is healed she still has a comma shaped proximal wound medially. She reminds as she will be leaving for [place] on September night. The remaining wound is on the right upper medial calf a comma shaped wound this may be closed by that time. We had some discussion about compression stockings 06-20-2023 upon evaluation today patient appears to be doing well currently in regard to her wounds in fact most everything appears to be about healed although she does have some dry skin over top of the distal left lateral leg. The proximal medial leg is very close to closure and seems to be doing better. 07-11-2023 upon evaluation today patient appears to be doing well currently in regard to her wound. She has been tolerating the dressing changes without complication. Fortunately there does not appear to be any signs of infection locally or systemically which is great news. Unfortunately she has been having issues with colitis and she appears to be very pale today she also tells me that she has been feeling fairly poorly. Her rheumatologist to put her on prednisone she  is also been put on antibiotics for the colitis. Her wounds on the left anterior lower extremity have reinitiated a little bit here and I am beginning to suspect this may be more autoimmune related. 10-to-24 upon evaluation today patient appears to be doing well currently at this point with regard to her wound. She has been tolerating the dressing changes without complication. Fortunately I do not see any signs of worsening overall I believe that the patient is making good headway towards complete closure which is great news. Still have a little concerned  about the possibility of her having some issues here with a rheumatologic or dermatologic issue. I do believe that the steroid ointment has done a little bit better for her the triamcinolone I prescribed last week. 07-25-2023 upon evaluation today patient appears to be doing really about the same in regard to her wounds. The steroid ointment does not seem to make in a big difference unfortunately. Fortunately I do not see any signs of active infection locally or systemically which is good news. With that being said she does have 2 new pustules that have shown up she states they started looking like pimples initially and then have gotten worse since. With that being said I do believe that we need to see what we can do to try to get this under control she does have an appointment with dermatology next week in the meantime I am actually see about getting her started with a antibiotic ointment instead of the steroid. Electronic Signature(s) Signed: 07/25/2023 1:29:09 PM By: Allen Derry PA-C Entered By: Allen Derry on 07/25/2023 13:29:09 -------------------------------------------------------------------------------- Physical Exam Details Patient Name: Date of Service: [place], [name] 07/25/2023 12:30 PM Medical Record Number: [id number] Patient Account Number: [id number] Date of Birth/Sex: Treating RN: [birth year] (89 y.o. F) Primary Care Provider: [name] Other Clinician: Referring Provider: Treating Provider/Extender: [name] in Treatment: 9 Constitutional Well-nourished and well-hydrated in no acute distress. Respiratory normal breathing without difficulty. Psychiatric this patient is able to make decisions and demonstrates good insight into disease process. Alert and Oriented x 3. pleasant and cooperative. Notes Upon inspection patient's wound bed actually showed signs of again being about the same I do not think her main areas of wounds are any worse  although the 2 new pustule areas actually did unroofed 1 of these and very carefully take a sample to send for PCR culture we will see what that shows when there is any REANNON, CANDELLA ([id number]) [id number] Page 3 of 9 bacteria present or absent if absent I think this is a separate inflammatory process altogether. Electronic Signature(s) Signed: 07/25/2023 1:29:35 PM By: Allen Derry PA-C Entered By: Allen Derry on 07/25/2023 13:29:35 -------------------------------------------------------------------------------- Physician Orders Details Patient Name: Date of Service: [place], [name] 07/25/2023 12:30 PM Medical Record Number: [id number] Patient Account Number: [id number] Date of Birth/Sex: Treating RN: 1933/06/25 (89 y.o. 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 - with Leonard Schwartz  Wednesday Room 7 08/01/23 @ 1015 Return A ppointment in 2 weeks. Leonard Schwartz Wednesday room 7 Return A 08/08/23 @ 1:15pm Return appointment in 3 weeks. Leonard Schwartz Wednesday - please schedule Bathing/ Shower/ Hygiene May shower and wash wound with soap and water. Edema Control - Lymphedema / SCD / Other Elevate legs to the level of the heart or above for 30 minutes daily and/or when sitting for 3-4 times a day throughout the day. Avoid standing for long periods of time. Exercise  regularly Wound Treatment Wound #10 - Foot Wound Laterality: Right, Lateral Cleanser: Soap and Water 1 x Per Day/30 Days Discharge Instructions: May shower and wash wound with dial antibacterial soap and water prior to dressing change. Peri-Wound Care: Sween Lotion (Moisturizing lotion) 1 x Per Day/30 Days Discharge Instructions: Apply moisturizing lotion as directed Topical: Mupirocin Ointment 1 x Per Day/30 Days Discharge Instructions: Apply Mupirocin (Bactroban) as instructed Prim Dressing: PolyMem Non-Adhesive Dressing, 4x4 in (Generic) 1 x Per Day/30 Days ary Discharge Instructions: Apply to wound bed as instructed Secondary Dressing: Bordered Gauze, 2x3.75 in (Generic) 1 x Per Day/30 Days Discharge Instructions: Apply over primary dressing as directed. Secondary Dressing: Woven Gauze Sponge, Non-Sterile 4x4 in (Generic) 1 x Per Day/30 Days Discharge Instructions: Apply over primary dressing as directed. Wound #11 - Lower Leg Wound Laterality: Left, Medial, Proximal Cleanser: Soap and Water 1 x Per Day/30 Days Discharge Instructions: May shower and wash wound with dial antibacterial soap and water prior to dressing change. Peri-Wound Care: Sween Lotion (Moisturizing lotion) 1 x Per Day/30 Days Discharge Instructions: Apply moisturizing lotion as directed [name], [name] ([id number]) [id number].pdf Page 4 of 9 Topical: Mupirocin Ointment 1 x Per Day/30 Days Discharge Instructions: Apply Mupirocin (Bactroban) as instructed Prim Dressing: PolyMem Non-Adhesive Dressing, 4x4 in (Generic) 1 x Per Day/30 Days ary Discharge Instructions: Apply to wound bed as instructed Secondary Dressing: Bordered Gauze, 2x3.75 in (Generic) 1 x Per Day/30 Days Discharge Instructions: Apply over primary dressing as directed. Secondary Dressing: Woven Gauze Sponge, Non-Sterile 4x4 in (Generic) 1 x Per Day/30 Days Discharge Instructions: Apply over primary dressing as directed. Wound  #12 - Lower Leg Wound Laterality: Left, Medial Cleanser: Soap and Water 1 x Per Day/30 Days Discharge Instructions: May shower and wash wound with dial antibacterial soap and water prior to dressing change. Peri-Wound Care: Sween Lotion (Moisturizing lotion) 1 x Per Day/30 Days Discharge Instructions: Apply moisturizing lotion as directed Topical: Mupirocin Ointment 1 x Per Day/30 Days Discharge Instructions: Apply Mupirocin (Bactroban) as instructed Prim Dressing: PolyMem Non-Adhesive Dressing, 4x4 in (Generic) 1 x Per Day/30 Days ary Discharge Instructions: Apply to wound bed as instructed Secondary Dressing: Bordered Gauze, 2x3.75 in (Generic) 1 x Per Day/30 Days Discharge Instructions: Apply over primary dressing as directed. Secondary Dressing: Woven Gauze Sponge, Non-Sterile 4x4 in (Generic) 1 x Per Day/30 Days Discharge Instructions: Apply over primary dressing as directed. Wound #13 - Lower Leg Wound Laterality: Left, Medial, Distal Cleanser: Soap and Water 1 x Per Day/30 Days Discharge Instructions: May shower and wash wound with dial antibacterial soap and water prior to dressing change. Peri-Wound Care: Sween Lotion (Moisturizing lotion) 1 x Per Day/30 Days Discharge Instructions: Apply moisturizing lotion as directed Topical: Mupirocin Ointment 1 x Per Day/30 Days Discharge Instructions: Apply Mupirocin (Bactroban) as instructed Prim Dressing: PolyMem Non-Adhesive Dressing, 4x4 in (Generic) 1 x Per Day/30 Days ary Discharge Instructions: Apply to wound bed as instructed Secondary Dressing: Bordered Gauze, 2x3.75 in (Generic) 1 x Per Day/30 Days Discharge  Instructions: Apply over primary dressing as directed. Secondary Dressing: Woven Gauze Sponge, Non-Sterile 4x4 in (Generic) 1 x Per Day/30 Days Discharge Instructions: Apply over primary dressing as directed. Wound #9 - Lower Leg Wound Laterality: Left, Anterior Cleanser: Soap and Water 1 x Per Day/30 Days Discharge  Instructions: May shower and wash wound with dial antibacterial soap and water prior to dressing change. Peri-Wound Care: Sween Lotion (Moisturizing lotion) 1 x Per Day/30 Days Discharge Instructions: Apply moisturizing lotion as directed Topical: Mupirocin Ointment 1 x Per Day/30 Days Discharge Instructions: Apply Mupirocin (Bactroban) as instructed Prim Dressing: PolyMem Non-Adhesive Dressing, 4x4 in (Generic) 1 x Per Day/30 Days ary Discharge Instructions: Apply to wound bed as instructed Secondary Dressing: Bordered Gauze, 2x3.75 in (Generic) 1 x Per Day/30 Days Discharge Instructions: Apply over primary dressing as directed. Secondary Dressing: Woven Gauze Sponge, Non-Sterile 4x4 in (Generic) 1 x Per Day/30 Days Discharge Instructions: Apply over primary dressing as directed. Patient Medications llergies: Sulfa (Sulfonamide Antibiotics), latex A Notifications Medication Indication Start End 07/25/2023 lidocaine LILINOE, ACKLIN ([id number]) [phone].pdf Page 5 of 9 DOSE topical 4 % cream - cream topical once daily 07/25/2023 mupirocin DOSE topical 2 % ointment - ointment topical once daily with each dressing change x 30 days Electronic Signature(s) Signed: 07/25/2023 1:31:19 PM By: Allen Derry PA-C Entered By: Allen Derry on 07/25/2023 13:31:18 -------------------------------------------------------------------------------- Problem List Details Patient Name: Date of Service: [place], [name] 07/25/2023 12:30 PM Medical Record Number: [id number] Patient Account Number: [id number] Date of Birth/Sex: Treating RN: 1933-04-25 (89 y.o. F) Primary Care Provider: [name] Other Clinician: Referring Provider: Treating Provider/Extender: [name] in Treatment: 9 Active Problems ICD-10 Encounter Code Description Active Date MDM Diagnosis I87.332 Chronic venous hypertension (idiopathic) with ulcer and inflammation of left  05/23/2023 No Yes lower extremity L97.822 Non-pressure chronic ulcer of other part of left lower leg with fat layer exposed8/04/2023 No Yes L89.893 Pressure ulcer of other site, stage 3 07/11/2023 No Yes L97.822 Non-pressure chronic ulcer of other part of left lower leg with fat layer exposed8/04/2023 No Yes I10 Essential (primary) hypertension 05/23/2023 No Yes Inactive Problems Resolved Problems Electronic Signature(s) Signed: 07/25/2023 1:15:17 PM By: Allen Derry PA-C Entered By: Allen Derry on 07/25/2023 13:15:17 -------------------------------------------------------------------------------- Progress Note Details Patient Name: Date of Service: [place], [name] 07/25/2023 12:30 PM Medical Record Number: [id number] Patient Account Number: [id number] [name], [name] ([id number]) [id number] Page 6 of 9 Date of Birth/Sex: Treating RN: 04/08/1933 (89 y.o. F) Primary Care Provider: Other Clinician: [name] Referring Provider: Treating Provider/Extender: [name] in Treatment: 9 Subjective Chief Complaint Information obtained from Patient Left LE Ulcer and right lateral foot pressure ulcer History of Present Illness (HPI) READMISSION 07/15/2019 Patient is now an 89 year old woman who was previously here in 2016 and 2018. Cared for at the time by Dr. Meyer Russel. Both times with wounds related to trauma in the left leg. She was felt to have underlying venous insufficiency although both occasions were related to trauma. The patient tells me that 2 weeks ago she hit her lateral left leg on the car door. This was a skin tear with a flap for a period of time she has been using Polysporin. The flap came off recently she has a clean looking superficial wound on the left lateral leg. Our intake nurse reported some drainage. Past medical history; includes sensorineural hearing loss, osteoarthritis, hypertension and a hammertoe on the right foot  for which  she follows with podiatry. ABIs in our clinic were 1.19 on the left 07/21/2019; the patient did not like the wraps. They slid down and rub the wound the wound is measuring larger she is upset. 10/12; left lateral leg wound. Most of this looks healthy even under illumination. We have been using Hydrofera Blue under border foam. She would not allow compression 10/19; left lateral leg wound. 2 small open areas remain of the original wound. We have been using Hydrofera Blue under border foam. This seems to be making decent progress 10/26 left lateral leg traumatic wound. There is no open wound remaining. We have been using Hydrofera Blue under border foam she arrived with some denuded epithelium that I removed there is no open wound remaining. Is fairly clear she has some degree of chronic venous insufficiency with not a lot of edema but dilated veins in her feet. She reminds me that she also has reactive arthritis and was on prednisone for a prolonged period of time. Nevertheless I think it would be beneficial for her to at least wear support stockings but right now I do not think she is going to agree to the Readmission: 02/18/2020 upon evaluation today patient has sustained a skin tear which occurred about 1 week ago she tells me. Fortunately there does not appear to be any signs of active infection at this time which is excellent news. She has been tolerating the dressing changes without complication. Overall very pleased with where things stand at this point. The only issue that I see here is that the skin flap somewhat folded back and then reattached further down causing an area that is actually bunched up to form where it closed on itself but then reattached on the end of the tissue. Nonetheless I think we have to trim off the bunched up tissue in order to allow this to heal appropriately. That is the only issue I really see today. 03/10/2020 upon evaluation today patient actually appears  to be doing excellent at this time. She has healed quite nicely and has been a couple of weeks since I saw her. Overall I feel like she is completely healed and ready for discharge as of today. Readmission: 03-21-2023 upon evaluation today patient appears to be doing somewhat poorly in regard to the left wound ulcer she tells me has been present for about a month. She tells me that she had this on the metal flatbed shopping carts at [facility] and subsequently has been having issues here with this since she tells me that the original Band-Aid she was using caused this to get worse. With that being said I do not see any evidence of active infection at this time everything seems to really be doing quite well as far as I am concerned. 03-28-2023 upon evaluation today patient appears to be doing well currently in regard to her wounds which in fact appear to be completely healed. I am very pleased with this. Readmission: 05-23-2023 upon evaluation patient presents for reevaluation here in the clinic concerning issues that she has been having with her left anterior lower extremity. This actually appears to have a significant amount of new skin growth over top of the areas of irritation I am not exactly sure what happened here to be perfectly honest. I explained to the patient that this is kind of an unusual presentation for this to be this irritated and yet not have any significant openings at this time. I do not see any evidence of active infection  locally or systemically which is great news. 05-30-2023 upon evaluation today patient appears to be doing well currently in regard to her lower extremity wounds which are actually drying out the may be a little bit too dry. I think PolyMem may be better for her and we discussed that today. Fortunately I do not see any signs of active infection locally or systemically which is great news. 06-06-2023 upon evaluation today patient appears to be doing well currently in  regard to her wounds. Both are showing signs of improvement which is great news. Fortunately I do not see any signs of active infection locally or systemically at this time. 8/28; the distal wound on the left leg is healed she still has a comma shaped proximal wound medially. She reminds as she will be leaving for [place] on September night. The remaining wound is on the right upper medial calf a comma shaped wound this may be closed by that time. We had some discussion about compression stockings 06-20-2023 upon evaluation today patient appears to be doing well currently in regard to her wounds in fact most everything appears to be about healed although she does have some dry skin over top of the distal left lateral leg. The proximal medial leg is very close to closure and seems to be doing better. 07-11-2023 upon evaluation today patient appears to be doing well currently in regard to her wound. She has been tolerating the dressing changes without complication. Fortunately there does not appear to be any signs of infection locally or systemically which is great news. Unfortunately she has been having issues with colitis and she appears to be very pale today she also tells me that she has been feeling fairly poorly. Her rheumatologist to put her on prednisone she is also been put on antibiotics for the colitis. Her wounds on the left anterior lower extremity have reinitiated a little bit here and I am beginning to suspect this may be more autoimmune related. 10-to-24 upon evaluation today patient appears to be doing well currently at this point with regard to her wound. She has been tolerating the dressing changes without complication. Fortunately I do not see any signs of worsening overall I believe that the patient is making good headway towards complete closure which is great news. Still have a little concerned about the possibility of her having some issues here with a rheumatologic or dermatologic  issue. I do believe that the steroid ointment has done a little bit better for her the triamcinolone I prescribed last week. 07-25-2023 upon evaluation today patient appears to be doing really about the same in regard to her wounds. The steroid ointment does not seem to make in a big difference unfortunately. Fortunately I do not see any signs of active infection locally or systemically which is good news. With that being said she does [name], [name] ([id number]) [id number] Page 7 of 9 have 2 new pustules that have shown up she states they started looking like pimples initially and then have gotten worse since. With that being said I do believe that we need to see what we can do to try to get this under control she does have an appointment with dermatology next week in the meantime I am actually see about getting her started with a antibiotic ointment instead of the steroid. Objective Constitutional Well-nourished and well-hydrated in no acute distress. Vitals Time Taken: 12:52 PM, Height: 62 in, Weight: 105 lbs, BMI: 19.2, Temperature: 98.0 F, Pulse: 52 bpm, Respiratory Rate:  18 breaths/min, Blood Pressure: 141/59 mmHg. Respiratory normal breathing without difficulty. Psychiatric this patient is able to make decisions and demonstrates good insight into disease process. Alert and Oriented x 3. pleasant and cooperative. General Notes: Upon inspection patient's wound bed actually showed signs of again being about the same I do not think her main areas of wounds are any worse although the 2 new pustule areas actually did unroofed 1 of these and very carefully take a sample to send for PCR culture we will see what that shows when there is any bacteria present or absent if absent I think this is a separate inflammatory process altogether. Integumentary (Hair, Skin) Wound #10 status is Open. Original cause of wound was Gradually Appeared. The date acquired was: 06/19/2023. The  wound has been in treatment 2 weeks. The wound is located on the Right,Lateral Foot. The wound measures 0.5cm length x 0.5cm width x 0.2cm depth; 0.196cm^2 area and 0.039cm^3 volume. There is Fat Layer (Subcutaneous Tissue) exposed. There is no tunneling or undermining noted. There is a medium amount of serosanguineous drainage noted. The wound margin is distinct with the outline attached to the wound base. There is small (1-33%) red granulation within the wound bed. There is a large (67-100%) amount of necrotic tissue within the wound bed including Adherent Slough. The periwound skin appearance had no abnormalities noted for moisture. The periwound skin appearance exhibited: Scarring. Periwound temperature was noted as No Abnormality. Wound #11 status is Open. Original cause of wound was Gradually Appeared. The date acquired was: 07/23/2023. The wound is located on the Left,Proximal,Medial Lower Leg. The wound measures 0.9cm length x 0.4cm width x 0.1cm depth; 0.283cm^2 area and 0.028cm^3 volume. There is Fat Layer (Subcutaneous Tissue) exposed. There is no tunneling or undermining noted. There is a medium amount of purulent drainage noted. There is no granulation within the wound bed. There is a large (67-100%) amount of necrotic tissue within the wound bed including Adherent Slough. Wound #12 status is Open. Original cause of wound was Gradually Appeared. The date acquired was: 07/23/2023. The wound is located on the Left,Medial Lower Leg. The wound measures 0.5cm length x 0.6cm width x 0.1cm depth; 0.236cm^2 area and 0.024cm^3 volume. There is Fat Layer (Subcutaneous Tissue) exposed. There is no tunneling or undermining noted. There is a medium amount of purulent drainage noted. There is no granulation within the wound bed. There is a large (67-100%) amount of necrotic tissue within the wound bed including Adherent Slough. Wound #13 status is Open. Original cause of wound was Gradually Appeared. The  date acquired was: 07/23/2023. The wound is located on the Left,Distal,Medial Lower Leg. The wound measures 0.3cm length x 0.3cm width x 0.1cm depth; 0.071cm^2 area and 0.007cm^3 volume. There is Fat Layer (Subcutaneous Tissue) exposed. There is no tunneling or undermining noted. There is a medium amount of purulent drainage noted. There is large (67-100%) red granulation within the wound bed. There is a small (1-33%) amount of necrotic tissue within the wound bed including Adherent Slough. Wound #7 status is Open. Original cause of wound was Trauma. The date acquired was: 02/23/2023. The wound has been in treatment 9 weeks. The wound is located on the [facility] Lower Leg. The wound measures 0cm length x 0cm width x 0cm depth; 0cm^2 area and 0cm^3 volume. There is no tunneling or undermining noted. There is a none present amount of drainage noted. The wound margin is flat and intact. There is no granulation within the wound bed. There is  no necrotic tissue within the wound bed. The periwound skin appearance had no abnormalities noted for texture. The periwound skin appearance exhibited: Maceration, Hemosiderin Staining. Periwound temperature was noted as No Abnormality. The periwound has tenderness on palpation. Wound #9 status is Open. Original cause of wound was Gradually Appeared. The date acquired was: 07/08/2023. The wound has been in treatment 2 weeks. The wound is located on the Left,Anterior Lower Leg. The wound measures 0.1cm length x 0.1cm width x 0.1cm depth; 0.008cm^2 area and 0.001cm^3 volume. There is Fat Layer (Subcutaneous Tissue) exposed. There is no tunneling or undermining noted. There is a none present amount of drainage noted. The wound margin is distinct with the outline attached to the wound base. There is large (67-100%) granulation within the wound bed. There is a small (1-33%) amount of necrotic tissue within the wound bed including Adherent Slough. The periwound skin  appearance did not exhibit: Callus, Crepitus, Excoriation, Induration, Rash, Scarring, Dry/Scaly, Maceration, Atrophie Blanche, Cyanosis, Ecchymosis, Hemosiderin Staining, Mottled, Pallor, Rubor, Erythema. Assessment Active Problems ICD-10 Chronic venous hypertension (idiopathic) with ulcer and inflammation of left lower extremity Non-pressure chronic ulcer of other part of left lower leg with fat layer exposed Pressure ulcer of other site, stage 3 Non-pressure chronic ulcer of other part of left lower leg with fat layer exposed Essential (primary) hypertension Plan [name], [name] ([id number]) [id number] Page 8 of 9 Follow-up Appointments: Return Appointment in 1 week. - with Leonard Schwartz Wednesday Room 7 08/01/23 @ 1015 Return Appointment in 2 weeks. Leonard Schwartz Wednesday room 7 08/08/23 @ 1:15pm Return appointment in 3 weeks. Leonard Schwartz Wednesday - please schedule Bathing/ Shower/ Hygiene: May shower and wash wound with soap and water. Edema Control - Lymphedema / SCD / Other: Elevate legs to the level of the heart or above for 30 minutes daily and/or when sitting for 3-4 times a day throughout the day. Avoid standing for long periods of time. Exercise regularly The following medication(s) was prescribed: lidocaine topical 4 % cream cream topical once daily was prescribed at facility mupirocin topical 2 % ointment ointment topical once daily with each dressing change x 30 days starting 07/25/2023 WOUND #10: - Foot Wound Laterality: Right, Lateral Cleanser: Soap and Water 1 x Per Day/30 Days Discharge Instructions: May shower and wash wound with dial antibacterial soap and water prior to dressing change. Peri-Wound Care: Sween Lotion (Moisturizing lotion) 1 x Per Day/30 Days Discharge Instructions: Apply moisturizing lotion as directed Topical: Mupirocin Ointment 1 x Per Day/30 Days Discharge Instructions: Apply Mupirocin (Bactroban) as instructed Prim Dressing: PolyMem  Non-Adhesive Dressing, 4x4 in (Generic) 1 x Per Day/30 Days ary Discharge Instructions: Apply to wound bed as instructed Secondary Dressing: Bordered Gauze, 2x3.75 in (Generic) 1 x Per Day/30 Days Discharge Instructions: Apply over primary dressing as directed. Secondary Dressing: Woven Gauze Sponge, Non-Sterile 4x4 in (Generic) 1 x Per Day/30 Days Discharge Instructions: Apply over primary dressing as directed. WOUND #11: - Lower Leg Wound Laterality: Left, Medial, Proximal Cleanser: Soap and Water 1 x Per Day/30 Days Discharge Instructions: May shower and wash wound with dial antibacterial soap and water prior to dressing change. Peri-Wound Care: Sween Lotion (Moisturizing lotion) 1 x Per Day/30 Days Discharge Instructions: Apply moisturizing lotion as directed Topical: Mupirocin Ointment 1 x Per Day/30 Days Discharge Instructions: Apply Mupirocin (Bactroban) as instructed Prim Dressing: PolyMem Non-Adhesive Dressing, 4x4 in (Generic) 1 x Per Day/30 Days ary Discharge Instructions: Apply to wound bed as instructed Secondary Dressing: Bordered Gauze, 2x3.75 in (Generic) 1  x Per Day/30 Days Discharge Instructions: Apply over primary dressing as directed. Secondary Dressing: Woven Gauze Sponge, Non-Sterile 4x4 in (Generic) 1 x Per Day/30 Days Discharge Instructions: Apply over primary dressing as directed. WOUND #12: - Lower Leg Wound Laterality: Left, Medial Cleanser: Soap and Water 1 x Per Day/30 Days Discharge Instructions: May shower and wash wound with dial antibacterial soap and water prior to dressing change. Peri-Wound Care: Sween Lotion (Moisturizing lotion) 1 x Per Day/30 Days Discharge Instructions: Apply moisturizing lotion as directed Topical: Mupirocin Ointment 1 x Per Day/30 Days Discharge Instructions: Apply Mupirocin (Bactroban) as instructed Prim Dressing: PolyMem Non-Adhesive Dressing, 4x4 in (Generic) 1 x Per Day/30 Days ary Discharge Instructions: Apply to wound bed  as instructed Secondary Dressing: Bordered Gauze, 2x3.75 in (Generic) 1 x Per Day/30 Days Discharge Instructions: Apply over primary dressing as directed. Secondary Dressing: Woven Gauze Sponge, Non-Sterile 4x4 in (Generic) 1 x Per Day/30 Days Discharge Instructions: Apply over primary dressing as directed. WOUND #13: - Lower Leg Wound Laterality: Left, Medial, Distal Cleanser: Soap and Water 1 x Per Day/30 Days Discharge Instructions: May shower and wash wound with dial antibacterial soap and water prior to dressing change. Peri-Wound Care: Sween Lotion (Moisturizing lotion) 1 x Per Day/30 Days Discharge Instructions: Apply moisturizing lotion as directed Topical: Mupirocin Ointment 1 x Per Day/30 Days Discharge Instructions: Apply Mupirocin (Bactroban) as instructed Prim Dressing: PolyMem Non-Adhesive Dressing, 4x4 in (Generic) 1 x Per Day/30 Days ary Discharge Instructions: Apply to wound bed as instructed Secondary Dressing: Bordered Gauze, 2x3.75 in (Generic) 1 x Per Day/30 Days Discharge Instructions: Apply over primary dressing as directed. Secondary Dressing: Woven Gauze Sponge, Non-Sterile 4x4 in (Generic) 1 x Per Day/30 Days Discharge Instructions: Apply over primary dressing as directed. WOUND #9: - Lower Leg Wound Laterality: Left, Anterior Cleanser: Soap and Water 1 x Per Day/30 Days Discharge Instructions: May shower and wash wound with dial antibacterial soap and water prior to dressing change. Peri-Wound Care: Sween Lotion (Moisturizing lotion) 1 x Per Day/30 Days Discharge Instructions: Apply moisturizing lotion as directed Topical: Mupirocin Ointment 1 x Per Day/30 Days Discharge Instructions: Apply Mupirocin (Bactroban) as instructed Prim Dressing: PolyMem Non-Adhesive Dressing, 4x4 in (Generic) 1 x Per Day/30 Days ary Discharge Instructions: Apply to wound bed as instructed Secondary Dressing: Bordered Gauze, 2x3.75 in (Generic) 1 x Per Day/30 Days Discharge  Instructions: Apply over primary dressing as directed. Secondary Dressing: Woven Gauze Sponge, Non-Sterile 4x4 in (Generic) 1 x Per Day/30 Days Discharge Instructions: Apply over primary dressing as directed. 1. I would recommend currently that we have the patient continue to monitor for any signs of infection or worsening. I am going to send in a prescription for mupirocin ointment which I think is probably to be the best way to go. 2. I am going to recommend as well that the patient should continue to utilize the PolyMem which I think is doing the best for her at this point. 3. Muscle can recommend that she should follow-up with dermatology next week we will see what they have to say as well. We will see patient back for reevaluation in 1 week here in the clinic. If anything worsens or changes patient will contact our office for additional recommendations. [name], [name] ([id number]) [id number] Page 9 of 9 Electronic Signature(s) Signed: 07/25/2023 1:31:33 PM By: Allen Derry PA-C Entered By: Allen Derry on 07/25/2023 13:31:32 -------------------------------------------------------------------------------- SuperBill Details Patient Name: Date of Service: [place], [name] 07/25/2023 Medical Record Number: [id number] Patient Account Number:  [id number] Date of Birth/Sex: Treating RN: Nov 21, 1932 (89 y.o. F) Primary Care Provider: [name] Other Clinician: Referring Provider: Treating Provider/Extender: [name] in Treatment: 9 Diagnosis Coding ICD-10 Codes Code Description [phone] Chronic venous hypertension (idiopathic) with ulcer and inflammation of left lower extremity L97.822 Non-pressure chronic ulcer of other part of left lower leg with fat layer exposed L89.893 Pressure ulcer of other site, stage 3 L97.822 Non-pressure chronic ulcer of other part of left lower leg with fat layer exposed I10 Essential (primary)  hypertension Facility Procedures : CPT4 Code: [id number] Description: 62130 - WOUND CARE VISIT-LEV 5 EST PT Modifier: Quantity: 1 Physician Procedures : CPT4 Code Description Modifier [id number] - WC PHYS LEVEL 4 - EST PT ICD-10 Diagnosis Description I87.332 Chronic venous hypertension (idiopathic) with ulcer and inflammation of left lower extremity L97.822 Non-pressure chronic ulcer of other part  of left lower leg with fat layer exposed L89.893 Pressure ulcer of other site, stage 3 I10 Essential (primary) hypertension Quantity: 1 Electronic Signature(s) Signed: 07/25/2023 2:53:36 PM By: Redmond Pulling RN, BSN Signed: 07/25/2023 4:43:13 PM By: Allen Derry PA-C Previous Signature: 07/25/2023 1:32:02 PM Version By: Allen Derry PA-C Entered By: Redmond Pulling on 07/25/2023 13:46:48

## 2023-07-25 NOTE — Progress Notes (Signed)
[name], [name] ([id number]) [id number] Page 1 of 15 Visit Report for 07/25/2023 Arrival Information Details Patient Name: Date of Service: [place], [name] 07/25/2023 12:30 PM Medical Record Number: [id number] Patient Account Number: [id number] Date of Birth/Sex: Treating RN: 19-Sep-1933 (89 y.o. [name], [name] Primary Care Jonael Paradiso: [name] Other Clinician: Referring Demiah Gullickson: Treating Naomy Esham/Extender: [name] in Treatment: 9 Visit Information History Since Last Visit Added or deleted any medications: No Patient Arrived: Ambulatory Any new allergies or adverse reactions: No Arrival Time: 12:44 Had a fall or experienced change in No Accompanied By: self activities of daily living that may affect Transfer Assistance: None risk of falls: Patient Identification Verified: Yes Signs or symptoms of abuse/neglect since last visito No Secondary Verification Process Completed: Yes Hospitalized since last visit: No Patient Requires Transmission-Based Precautions: No Implantable device outside of the clinic excluding No Patient Has Alerts: No cellular tissue based products placed in the center since last visit: Has Dressing in Place as Prescribed: Yes Pain Present Now: No Electronic Signature(s) Signed: 07/25/2023 2:53:36 PM By: Redmond Pulling RN, BSN Entered By: Redmond Pulling on 07/25/2023 09:45:07 -------------------------------------------------------------------------------- Clinic Level of Care Assessment Details Patient Name: Date of Service: [place], [name] 07/25/2023 12:30 PM Medical Record Number: [id number] Patient Account Number: [id number] Date of Birth/Sex: Treating RN: July 10, 1933 (89 y.o. [name] Primary Care Randal Goens: [name] Other Clinician: Referring Naphtali Zywicki: Treating Khaliah Barnick/Extender: [name] in Treatment: 9 Clinic Level of Care Assessment  Items TOOL 4 Quantity Score X- 1 0 Use when only an EandM is performed on FOLLOW-UP visit ASSESSMENTS - Nursing Assessment / Reassessment X- 1 10 Reassessment of Co-morbidities (includes updates in patient status) X- 1 5 Reassessment of Adherence to Treatment Plan ASSESSMENTS - Wound and Skin A ssessment / Reassessment []  - 0 Simple Wound Assessment / Reassessment - one wound X- 6 5 Complex Wound Assessment / Reassessment - multiple wounds []  - 0 Dermatologic / Skin Assessment (not related to wound area) ASSESSMENTS - Focused Assessment X- 2 5 Circumferential Edema Measurements - multi extremities []  - 0 Nutritional Assessment / Counseling / Intervention [name], [name] ([id number]) [id number] Page 2 of 15 []  - 0 Lower Extremity Assessment (monofilament, tuning fork, pulses) []  - 0 Peripheral Arterial Disease Assessment (using hand held doppler) ASSESSMENTS - Ostomy and/or Continence Assessment and Care []  - 0 Incontinence Assessment and Management []  - 0 Ostomy Care Assessment and Management (repouching, etc.) PROCESS - Coordination of Care X - Simple Patient / Family Education for ongoing care 1 15 []  - 0 Complex (extensive) Patient / Family Education for ongoing care X- 1 10 Staff obtains Chiropractor, Records, T Results / Process Orders est []  - 0 Staff telephones HHA, Nursing Homes / Clarify orders / etc []  - 0 Routine Transfer to another Facility (non-emergent condition) []  - 0 Routine Hospital Admission (non-emergent condition) []  - 0 New Admissions / Manufacturing engineer / Ordering NPWT Apligraf, etc. , []  - 0 Emergency Hospital Admission (emergent condition) []  - 0 Simple Discharge Coordination []  - 0 Complex (extensive) Discharge Coordination PROCESS - Special Needs []  - 0 Pediatric / Minor Patient Management []  - 0 Isolation Patient Management []  - 0 Hearing / Language / Visual special needs []  - 0 Assessment of Community  assistance (transportation, D/C planning, etc.) []  - 0 Additional assistance / Altered mentation []  - 0 Support Surface(s) Assessment (bed, cushion, seat, etc.) INTERVENTIONS - Wound Cleansing / Measurement []  -  0 Simple Wound Cleansing - one wound X- 5 5 Complex Wound Cleansing - multiple wounds X- 1 5 Wound Imaging (photographs - any number of wounds) []  - 0 Wound Tracing (instead of photographs) []  - 0 Simple Wound Measurement - one wound X- 6 5 Complex Wound Measurement - multiple wounds INTERVENTIONS - Wound Dressings X - Small Wound Dressing one or multiple wounds 1 10 X- 1 15 Medium Wound Dressing one or multiple wounds []  - 0 Large Wound Dressing one or multiple wounds X- 1 5 Application of Medications - topical []  - 0 Application of Medications - injection INTERVENTIONS - Miscellaneous []  - 0 External ear exam X- 1 5 Specimen Collection (cultures, biopsies, blood, body fluids, etc.) []  - 0 Specimen(s) / Culture(s) sent or taken to Lab for analysis []  - 0 Patient Transfer (multiple staff / Nurse, adult / Similar devices) []  - 0 Simple Staple / Suture removal (25 or less) []  - 0 Complex Staple / Suture removal (26 or more) []  - 0 Hypo / Hyperglycemic Management (close monitor of Blood Glucose) [name], [name] ([id number]) [id number] Page 3 of 15 []  - 0 Ankle / Brachial Index (ABI) - do not check if billed separately X- 1 5 Vital Signs Has the patient been seen at the hospital within the last three years: Yes Total Score: 180 Level Of Care: New/Established - Level 5 Electronic Signature(s) Signed: 07/25/2023 2:53:36 PM By: Redmond Pulling RN, BSN Entered By: Redmond Pulling on 07/25/2023 10:46:32 -------------------------------------------------------------------------------- Encounter Discharge Information Details Patient Name: Date of Service: [place], [name] 07/25/2023 12:30 PM Medical Record Number: [id number] Patient Account  Number: [id number] Date of Birth/Sex: Treating RN: November 28, 1932 (89 y.o. [name] Primary Care Eulonda Andalon: [name] Other Clinician: Referring Raylee Strehl: Treating Traylon Schimming/Extender: [name] in Treatment: 9 Encounter Discharge Information Items Discharge Condition: Stable Ambulatory Status: Ambulatory Discharge Destination: Home Transportation: Private Auto Accompanied By: self Schedule Follow-up Appointment: Yes Clinical Summary of Care: Patient Declined Electronic Signature(s) Signed: 07/25/2023 2:53:36 PM By: Redmond Pulling RN, BSN Entered By: Redmond Pulling on 07/25/2023 10:48:05 -------------------------------------------------------------------------------- Lower Extremity Assessment Details Patient Name: Date of Service: [place], [name] 07/25/2023 12:30 PM Medical Record Number: [id number] Patient Account Number: [id number] Date of Birth/Sex: Treating RN: [birth year] (89 y.o. [name] Primary Care Addie Cederberg: [name] Other Clinician: Referring Aloise Copus: Treating Lachelle Rissler/Extender: [name] in Treatment: 9 Edema Assessment Assessed: [Left: No] [Right: No] Edema: [Left: N] [Right: o] Calf Left: Right: Point of Measurement: From Medial Instep 29.5 cm 29 cm Ankle Left: Right: Point of Measurement: From Medial Instep 16.5 cm 16 cm Vascular Assessment [name], [name] ([id number]) [Right:[id number] Page 4 of 15] Pulses: Dorsalis Pedis Palpable: [Left:Yes] [Right:Yes] Extremity colors, hair growth, and conditions: Extremity Color: [Left:Hyperpigmented] Hair Growth on Extremity: [Left:Yes] Temperature of Extremity: [Left:Warm] Capillary Refill: [Left:< 3 seconds] Dependent Rubor: [Left:Yes No] Electronic Signature(s) Signed: 07/25/2023 2:53:36 PM By: Redmond Pulling RN, BSN Entered By: Redmond Pulling on 07/25/2023  09:47:29 -------------------------------------------------------------------------------- Multi-Disciplinary Care Plan Details Patient Name: Date of Service: [place], [name] 07/25/2023 12:30 PM Medical Record Number: [id number] Patient Account Number: [id number] Date of Birth/Sex: Treating RN: 12/22/1932 (89 y.o. [name] Primary Care Mahnoor Mathisen: [name] Other Clinician: Referring Ambrea Hegler: Treating Peg Fifer/Extender: [name] in Treatment: 9 Multidisciplinary Care Plan reviewed with physician Active Inactive Abuse / Safety / Falls / Self Care Management Nursing Diagnoses: Potential for falls  Goals: Patient/caregiver will verbalize/demonstrate measures taken to prevent injury and/or falls Date Initiated: 05/23/2023 Target Resolution Date: 08/20/2023 Goal Status: Active Interventions: Assess fall risk on admission and as needed Notes: Venous Leg Ulcer Nursing Diagnoses: Knowledge deficit related to disease process and management Potential for venous Insuffiency (use before diagnosis confirmed) Goals: Patient will maintain optimal edema control Date Initiated: 05/23/2023 Target Resolution Date: 08/20/2023 Goal Status: Active Interventions: Assess peripheral edema status every visit. Compression as ordered Treatment Activities: Therapeutic compression applied : 05/23/2023 Notes: Wound/Skin Impairment Nursing Diagnoses: Impaired tissue integrity [name], [name] ([id number]) [phone] Page 5 of 15 Knowledge deficit related to ulceration/compromised skin integrity Goals: Patient/caregiver will verbalize understanding of skin care regimen Date Initiated: 05/23/2023 Target Resolution Date: 08/20/2023 Goal Status: Active Ulcer/skin breakdown will have a volume reduction of 30% by week 4 Date Initiated: 05/23/2023 Target Resolution Date: 08/20/2023 Goal Status: Active Interventions: Assess patient/caregiver ability  to obtain necessary supplies Assess patient/caregiver ability to perform ulcer/skin care regimen upon admission and as needed Assess ulceration(s) every visit Provide education on ulcer and skin care Treatment Activities: Skin care regimen initiated : 05/23/2023 Topical wound management initiated : 05/23/2023 Notes: Electronic Signature(s) Signed: 07/25/2023 2:53:36 PM By: Redmond Pulling RN, BSN Entered By: Redmond Pulling on 07/25/2023 10:42:31 -------------------------------------------------------------------------------- Pain Assessment Details Patient Name: Date of Service: [place], [name] 07/25/2023 12:30 PM Medical Record Number: [id number] Patient Account Number: [id number] Date of Birth/Sex: Treating RN: 04/08/1933 (89 y.o. [name] Primary Care Subrina Vecchiarelli: [name] Other Clinician: Referring Chemere Steffler: Treating Jahzaria Vary/Extender: [name] in Treatment: 9 Active Problems Location of Pain Severity and Description of Pain Patient Has Paino No Site Locations Pain Management and Medication Current Pain Management: Electronic Signature(s) Signed: 07/25/2023 2:53:36 PM By: Redmond Pulling RN, BSN Entered By: Redmond Pulling on 07/25/2023 09:45:19 [name] ([id number]) [id number] Page 6 of 15 -------------------------------------------------------------------------------- Patient/Caregiver Education Details Patient Name: Date of Service: [name] 10/9/2024andnbsp12:30 PM Medical Record Number: [id number] Patient Account Number: [id number] Date of Birth/Gender: Treating RN: June 21, 1933 (89 y.o. [name] Primary Care Physician: [name] Other Clinician: Referring Physician: Treating Physician/Extender: [name] in Treatment: 9 Education Assessment Education Provided To: Patient Education Topics Provided Wound/Skin Impairment: Methods:  Explain/Verbal Responses: State content correctly [employer]) Signed: 07/25/2023 2:53:36 PM By: Redmond Pulling RN, BSN Entered By: Redmond Pulling on 07/25/2023 10:42:49 -------------------------------------------------------------------------------- Wound Assessment Details Patient Name: Date of Service: [place], [name] 07/25/2023 12:30 PM Medical Record Number: [id number] Patient Account Number: [id number] Date of Birth/Sex: Treating RN: 1933-07-31 (89 y.o. [name] Primary Care Rooney Gladwin: [name] Other Clinician: Referring Kemar Pandit: Treating Estefanie Cornforth/Extender: [name] in Treatment: 9 Wound Status Wound Number: 10 Primary Pressure Ulcer Etiology: Wound Location: Right, Lateral Foot Wound Status: Open Wounding Event: Gradually Appeared Comorbid Cataracts, Hypertension, Raynauds, Rheumatoid Arthritis, Date Acquired: 06/19/2023 History: Osteoarthritis Weeks Of Treatment: 2 Clustered Wound: No Photos Wound Measurements Length: (cm) 0.5 Mates, Sharanda [name] ([id number]) Width: (cm) 0 Depth: (cm) 0 Area: (cm) Volume: (cm) % Reduction in Area: 40.6% [id number] Page 7 of 15 .5 % Reduction in Volume: 40.9% .2 Epithelialization: Small (1-33%) 0.196 Tunneling: No 0.039 Undermining: No Wound Description Classification: Category/Stage III Wound Margin: Distinct, outline attached Exudate Amount: Medium Exudate Type: Serosanguineous Exudate Color: red, brown Foul Odor After Cleansing: No Slough/Fibrino Yes Wound Bed Granulation Amount: Small (1-33%) Exposed Structure Granulation Quality: Red Fascia Exposed: No Necrotic  Amount: Large (67-100%) Fat Layer (Subcutaneous Tissue) Exposed: Yes Necrotic Quality: Adherent Slough Tendon Exposed: No Muscle Exposed: No Joint Exposed: No Bone Exposed: No Periwound Skin Texture Texture Color No Abnormalities Noted: No No Abnormalities Noted: No Scarring:  Yes Temperature / Pain Temperature: No Abnormality Moisture No Abnormalities Noted: Yes Treatment Notes Wound #10 (Foot) Wound Laterality: Right, Lateral Cleanser Soap and Water Discharge Instruction: May shower and wash wound with dial antibacterial soap and water prior to dressing change. Peri-Wound Care Sween Lotion (Moisturizing lotion) Discharge Instruction: Apply moisturizing lotion as directed Topical Mupirocin Ointment Discharge Instruction: Apply Mupirocin (Bactroban) as instructed Primary Dressing PolyMem Non-Adhesive Dressing, 4x4 in Discharge Instruction: Apply to wound bed as instructed Secondary Dressing Bordered Gauze, 2x3.75 in Discharge Instruction: Apply over primary dressing as directed. Woven Gauze Sponge, Non-Sterile 4x4 in Discharge Instruction: Apply over primary dressing as directed. Secured With Compression Wrap Compression Stockings Facilities manager) Signed: 07/25/2023 2:53:36 PM By: Redmond Pulling RN, BSN Entered By: Redmond Pulling on 07/25/2023 10:07:28 [name] ([id number]) [id number] Page 8 of 15 -------------------------------------------------------------------------------- Wound Assessment Details Patient Name: Date of Service: [place], [name] 07/25/2023 12:30 PM Medical Record Number: [id number] Patient Account Number: [id number] Date of Birth/Sex: Treating RN: [birth year] (89 y.o. [name] Primary Care Shawon Denzer: [name] Other Clinician: Referring Quention Mcneill: Treating Deriyah Kunath/Extender: [name] in Treatment: 9 Wound Status Wound Number: 11 Primary Lesion Etiology: Wound Location: Left, Proximal, Medial Lower Leg Wound Status: Open Wounding Event: Gradually Appeared Comorbid Cataracts, Hypertension, Raynauds, Rheumatoid Arthritis, Date Acquired: 07/23/2023 History: Osteoarthritis Weeks Of Treatment: 0 Clustered Wound: No Photos Wound  Measurements Length: (cm) 0.9 Width: (cm) 0.4 Depth: (cm) 0.1 Area: (cm) 0.283 Volume: (cm) 0.028 % Reduction in Area: % Reduction in Volume: Epithelialization: None Tunneling: No Undermining: No Wound Description Classification: Full Thickness Without Exposed Suppor Exudate Amount: Medium Exudate Type: Purulent Exudate Color: yellow, brown, green t Structures Foul Odor After Cleansing: No Slough/Fibrino Yes Wound Bed Granulation Amount: None Present (0%) Exposed Structure Necrotic Amount: Large (67-100%) Fascia Exposed: No Necrotic Quality: Adherent Slough Fat Layer (Subcutaneous Tissue) Exposed: Yes Tendon Exposed: No Muscle Exposed: No Joint Exposed: No Bone Exposed: No Periwound Skin Texture Texture Color No Abnormalities Noted: No No Abnormalities Noted: No Moisture No Abnormalities Noted: No Treatment Notes Wound #11 (Lower Leg) Wound Laterality: Left, Medial, Proximal Cleanser Soap and Water Discharge Instruction: May shower and wash wound with dial antibacterial soap and water prior to dressing change. Peri-Wound Care Sween Lotion (Moisturizing lotion) Discharge Instruction: Apply moisturizing lotion as directed Topical LAVERN, CRIMI ([id number]) [id number] Page 9 of 15 Mupirocin Ointment Discharge Instruction: Apply Mupirocin (Bactroban) as instructed Primary Dressing PolyMem Non-Adhesive Dressing, 4x4 in Discharge Instruction: Apply to wound bed as instructed Secondary Dressing Bordered Gauze, 2x3.75 in Discharge Instruction: Apply over primary dressing as directed. Woven Gauze Sponge, Non-Sterile 4x4 in Discharge Instruction: Apply over primary dressing as directed. Secured With Compression Wrap Compression Stockings Facilities manager) Signed: 07/25/2023 2:53:36 PM By: Redmond Pulling RN, BSN Entered By: Redmond Pulling on 07/25/2023  10:08:09 -------------------------------------------------------------------------------- Wound Assessment Details Patient Name: Date of Service: [place], [name] 07/25/2023 12:30 PM Medical Record Number: [id number] Patient Account Number: [id number] Date of Birth/Sex: Treating RN: 03/29/1933 (89 y.o. [name] Primary Care Milus Fritze: [name] Other Clinician: Referring Kashay Cavenaugh: Treating Dustin Burrill/Extender: [name] in Treatment: 9 Wound Status Wound Number: 12 Primary Lesion Etiology: Wound Location: Left, Medial Lower Leg  Wound Status: Open Wounding Event: Gradually Appeared Comorbid Cataracts, Hypertension, Raynauds, Rheumatoid Arthritis, Date Acquired: 07/23/2023 History: Osteoarthritis Weeks Of Treatment: 0 Clustered Wound: No Photos Wound Measurements Length: (cm) 0.5 Width: (cm) 0.6 Depth: (cm) 0.1 Area: (cm) 0.236 Volume: (cm) 0.024 % Reduction in Area: % Reduction in Volume: Epithelialization: None Tunneling: No Undermining: No Wound Description Classification: Full Thickness Without Exposed Support Structures Exudate Amount: Medium Exudate Type: Purulent [name], [name] ([id number]) Exudate Color: yellow, brown, green Foul Odor After Cleansing: No Slough/Fibrino Yes [id number] Page 10 of 15 Wound Bed Granulation Amount: None Present (0%) Exposed Structure Necrotic Amount: Large (67-100%) Fascia Exposed: No Necrotic Quality: Adherent Slough Fat Layer (Subcutaneous Tissue) Exposed: Yes Tendon Exposed: No Muscle Exposed: No Joint Exposed: No Bone Exposed: No Periwound Skin Texture Texture Color No Abnormalities Noted: No No Abnormalities Noted: No Moisture No Abnormalities Noted: No Treatment Notes Wound #12 (Lower Leg) Wound Laterality: Left, Medial Cleanser Soap and Water Discharge Instruction: May shower and wash wound with dial antibacterial soap and water prior to dressing  change. Peri-Wound Care Sween Lotion (Moisturizing lotion) Discharge Instruction: Apply moisturizing lotion as directed Topical Mupirocin Ointment Discharge Instruction: Apply Mupirocin (Bactroban) as instructed Primary Dressing PolyMem Non-Adhesive Dressing, 4x4 in Discharge Instruction: Apply to wound bed as instructed Secondary Dressing Bordered Gauze, 2x3.75 in Discharge Instruction: Apply over primary dressing as directed. Woven Gauze Sponge, Non-Sterile 4x4 in Discharge Instruction: Apply over primary dressing as directed. Secured With Compression Wrap Compression Stockings Facilities manager) Signed: 07/25/2023 2:53:36 PM By: Redmond Pulling RN, BSN Entered By: Redmond Pulling on 07/25/2023 10:08:48 -------------------------------------------------------------------------------- Wound Assessment Details Patient Name: Date of Service: [place], [name] 07/25/2023 12:30 PM Medical Record Number: [id number] Patient Account Number: [id number] Date of Birth/Sex: Treating RN: 10/26/1932 (89 y.o. [name] Primary Care Rabab Currington: [name] Other Clinician: Referring Adiya Selmer: Treating Anatole Apollo/Extender: [name] in Treatment: 9 Wound Status Wound Number: 13 Primary Lesion Etiology: SHAUNTELL, [name] ([id number]) [id number] Page 11 of 15 Etiology: Wound Location: Left, Distal, Medial Lower Leg Wound Status: Open Wounding Event: Gradually Appeared Comorbid Cataracts, Hypertension, Raynauds, Rheumatoid Arthritis, Date Acquired: 07/23/2023 History: Osteoarthritis Weeks Of Treatment: 0 Clustered Wound: No Photos Wound Measurements Length: (cm) 0.3 Width: (cm) 0.3 Depth: (cm) 0.1 Area: (cm) 0.071 Volume: (cm) 0.007 % Reduction in Area: % Reduction in Volume: Epithelialization: None Tunneling: No Undermining: No Wound Description Classification: Full Thickness Without Exposed Suppor Exudate  Amount: Medium Exudate Type: Purulent Exudate Color: yellow, brown, green t Structures Foul Odor After Cleansing: No Slough/Fibrino Yes Wound Bed Granulation Amount: Large (67-100%) Exposed Structure Granulation Quality: Red Fascia Exposed: No Necrotic Amount: Small (1-33%) Fat Layer (Subcutaneous Tissue) Exposed: Yes Necrotic Quality: Adherent Slough Tendon Exposed: No Muscle Exposed: No Joint Exposed: No Bone Exposed: No Periwound Skin Texture Texture Color No Abnormalities Noted: No No Abnormalities Noted: No Moisture No Abnormalities Noted: No Treatment Notes Wound #13 (Lower Leg) Wound Laterality: Left, Medial, Distal Cleanser Soap and Water Discharge Instruction: May shower and wash wound with dial antibacterial soap and water prior to dressing change. Peri-Wound Care Sween Lotion (Moisturizing lotion) Discharge Instruction: Apply moisturizing lotion as directed Topical Mupirocin Ointment Discharge Instruction: Apply Mupirocin (Bactroban) as instructed Primary Dressing PolyMem Non-Adhesive Dressing, 4x4 in Discharge Instruction: Apply to wound bed as instructed Secondary Dressing Bordered Gauze, 2x3.75 in Discharge Instruction: Apply over primary dressing as directed. Woven Gauze Sponge, Non-Sterile 4x4 in Discharge Instruction: Apply over primary dressing as directed. [name], [name] ([id number])  [id number] Page 12 of 15 Secured With Compression Wrap Compression Stockings Facilities manager) Signed: 07/25/2023 2:53:36 PM By: Redmond Pulling RN, BSN Entered By: Redmond Pulling on 07/25/2023 10:09:44 -------------------------------------------------------------------------------- Wound Assessment Details Patient Name: Date of Service: [name], [name] 07/25/2023 12:30 PM Medical Record Number: [id number] Patient Account Number: [id number] Date of Birth/Sex: Treating RN: Nov 16, 1932 (89 y.o. [name] Primary Care  Samatha Anspach: [name] Other Clinician: Referring Monzerat Handler: Treating Adrien Dietzman/Extender: [name] in Treatment: 9 Wound Status Wound Number: 7 Primary Venous Leg Ulcer Etiology: Wound Location: Left, Distal, Anterior Lower Leg Wound Status: Open Wounding Event: Trauma Comorbid Cataracts, Hypertension, Raynauds, Rheumatoid Arthritis, Date Acquired: 02/23/2023 History: Osteoarthritis Weeks Of Treatment: 9 Clustered Wound: No Photos Wound Measurements Length: (cm) Width: (cm) Depth: (cm) Area: (cm) Volume: (cm) 0 % Reduction in Area: 100% 0 % Reduction in Volume: 100% 0 Epithelialization: Large (67-100%) 0 Tunneling: No 0 Undermining: No Wound Description Classification: Full Thickness Without Exposed Support S Wound Margin: Flat and Intact Exudate Amount: None Present tructures Foul Odor After Cleansing: No Slough/Fibrino No Wound Bed Granulation Amount: None Present (0%) Exposed Structure Necrotic Amount: None Present (0%) Fascia Exposed: No Fat Layer (Subcutaneous Tissue) Exposed: No Tendon Exposed: No Muscle Exposed: No Joint Exposed: No Bone Exposed: No [address] SHELLEY, COCKE [name] ([id number]) [id number] Page 13 of 15 Texture Color No Abnormalities Noted: Yes No Abnormalities Noted: No Hemosiderin Staining: Yes Moisture No Abnormalities Noted: No Temperature / Pain Maceration: Yes Temperature: No Abnormality Tenderness on Palpation: Yes Electronic Signature(s) Signed: 07/25/2023 2:53:36 PM By: Redmond Pulling RN, BSN Entered By: Redmond Pulling on 07/25/2023 10:10:27 -------------------------------------------------------------------------------- Wound Assessment Details Patient Name: Date of Service: [place], [name] 07/25/2023 12:30 PM Medical Record Number: [id number] Patient Account Number: [id number] Date of Birth/Sex: Treating RN: [birth year] (89 y.o. [name] Primary Care  Electra Paladino: [name] Other Clinician: Referring Keli Buehner: Treating Lewis Grivas/Extender: [name] in Treatment: 9 Wound Status Wound Number: 9 Primary Venous Leg Ulcer Etiology: Wound Location: Left, Anterior Lower Leg Wound Status: Open Wounding Event: Gradually Appeared Comorbid Cataracts, Hypertension, Raynauds, Rheumatoid Arthritis, Date Acquired: 07/08/2023 History: Osteoarthritis Weeks Of Treatment: 2 Clustered Wound: No Photos Wound Measurements Length: (cm) 0.1 Width: (cm) 0.1 Depth: (cm) 0.1 Area: (cm) 0.008 Volume: (cm) 0.001 % Reduction in Area: 93.2% % Reduction in Volume: 91.7% Epithelialization: Large (67-100%) Tunneling: No Undermining: No Wound Description Classification: Full Thickness Without Exposed Support Structures Wound Margin: Distinct, outline attached Exudate Amount: None Present Foul Odor After Cleansing: No Slough/Fibrino No Wound Bed Granulation Amount: Large (67-100%) Exposed Structure Necrotic Amount: Small (1-33%) Fascia Exposed: No Necrotic Quality: Adherent Slough Fat Layer (Subcutaneous Tissue) Exposed: Yes Tendon Exposed: No Muscle Exposed: No Joint Exposed: No Bone Exposed: No [address] [name], [name] ([id number]) [id number] Page 14 of 15 No Abnormalities Noted: No No Abnormalities Noted: No Callus: No Atrophie Blanche: No Crepitus: No Cyanosis: No Excoriation: No Ecchymosis: No Induration: No Erythema: No Rash: No Hemosiderin Staining: No Scarring: No Mottled: No Pallor: No Moisture Rubor: No No Abnormalities Noted: No Dry / Scaly: No Maceration: No Treatment Notes Wound #9 (Lower Leg) Wound Laterality: Left, Anterior Cleanser Soap and Water Discharge Instruction: May shower and wash wound with dial antibacterial soap and water prior to dressing change. Peri-Wound Care Sween Lotion (Moisturizing lotion) Discharge  Instruction: Apply moisturizing lotion as directed Topical Mupirocin Ointment Discharge Instruction: Apply Mupirocin (Bactroban) as instructed Primary  Dressing PolyMem Non-Adhesive Dressing, 4x4 in Discharge Instruction: Apply to wound bed as instructed Secondary Dressing Bordered Gauze, 2x3.75 in Discharge Instruction: Apply over primary dressing as directed. Woven Gauze Sponge, Non-Sterile 4x4 in Discharge Instruction: Apply over primary dressing as directed. Secured With Compression Wrap Compression Stockings Facilities manager) Signed: 07/25/2023 2:53:36 PM By: Redmond Pulling RN, BSN Entered By: Redmond Pulling on 07/25/2023 10:11:41 -------------------------------------------------------------------------------- Vitals Details Patient Name: Date of Service: [place], [name] 07/25/2023 12:30 PM Medical Record Number: [id number] Patient Account Number: [id number] Date of Birth/Sex: Treating RN: 11-Aug-1933 (89 y.o. [name] Primary Care Ilyaas Musto: [name] Other Clinician: Referring Aadhya Bustamante: Treating Katheren Jimmerson/Extender: [name] in Treatment: 9 Vital Signs Time Taken: 12:52 Temperature (F): 98.0 Height (in): 62 Pulse (bpm): 52 Weight (lbs): 105 Respiratory Rate (breaths/min): 18 Body Mass Index (BMI): 19.2 Blood Pressure (mmHg): 141/59 Reference Range: 80 - 120 mg / dl ASHANA, TULLO [name] ([id number]) [id number] Page 15 of 15 Electronic Signature(s) Signed: 07/25/2023 2:53:36 PM By: Redmond Pulling RN, BSN Entered By: Redmond Pulling on 07/25/2023 09:52:48

## 2023-07-30 DIAGNOSIS — Z111 Encounter for screening for respiratory tuberculosis: Secondary | ICD-10-CM | POA: Diagnosis not present

## 2023-07-30 DIAGNOSIS — M0609 Rheumatoid arthritis without rheumatoid factor, multiple sites: Secondary | ICD-10-CM | POA: Diagnosis not present

## 2023-07-31 DIAGNOSIS — L309 Dermatitis, unspecified: Secondary | ICD-10-CM | POA: Diagnosis not present

## 2023-07-31 DIAGNOSIS — Z85828 Personal history of other malignant neoplasm of skin: Secondary | ICD-10-CM | POA: Diagnosis not present

## 2023-07-31 DIAGNOSIS — L905 Scar conditions and fibrosis of skin: Secondary | ICD-10-CM | POA: Diagnosis not present

## 2023-07-31 DIAGNOSIS — L738 Other specified follicular disorders: Secondary | ICD-10-CM | POA: Diagnosis not present

## 2023-08-01 ENCOUNTER — Encounter (HOSPITAL_BASED_OUTPATIENT_CLINIC_OR_DEPARTMENT_OTHER): Payer: PPO | Admitting: Physician Assistant

## 2023-08-01 DIAGNOSIS — I872 Venous insufficiency (chronic) (peripheral): Secondary | ICD-10-CM | POA: Diagnosis not present

## 2023-08-01 DIAGNOSIS — L89893 Pressure ulcer of other site, stage 3: Secondary | ICD-10-CM | POA: Diagnosis not present

## 2023-08-01 DIAGNOSIS — L97822 Non-pressure chronic ulcer of other part of left lower leg with fat layer exposed: Secondary | ICD-10-CM | POA: Diagnosis not present

## 2023-08-01 LAB — MISCELLANEOUS TEST

## 2023-08-01 NOTE — Progress Notes (Addendum)
HAGAR, ZANARDI ([id number]) [id number] Page 1 of 8 Visit Report for 08/01/2023 Chief Complaint Document Details Patient Name: Date of Service: [name], [name] 08/01/2023 10:15 A M Medical Record Number: [id number] Patient Account Number: [id number] Date of Birth/Sex: Treating RN: September 10, 1933 (89 y.o. F) Primary Care Provider: [name] Other Clinician: Referring Provider: Treating Provider/Extender: [name] in Treatment: 10 Information Obtained from: Patient Chief Complaint Left LE Ulcer and right lateral foot pressure ulcer Electronic Signature(s) Signed: 08/01/2023 10:39:14 AM By: Allen Derry PA-C Entered By: Allen Derry on 08/01/2023 10:39:14 -------------------------------------------------------------------------------- HPI Details Patient Name: Date of Service: [place], [name] 08/01/2023 10:15 A M Medical Record Number: [id number] Patient Account Number: [id number] Date of Birth/Sex: Treating RN: May 10, 1933 (89 y.o. F) Primary Care Provider: [name] Other Clinician: Referring Provider: Treating Provider/Extender: [name] in Treatment: 10 History of Present Illness HPI Description: READMISSION 07/15/2019 Patient is now an 89 year old woman who was previously here in 2016 and 2018. Cared for at the time by Dr. Meyer Russel. Both times with wounds related to trauma in the left leg. She was felt to have underlying venous insufficiency although both occasions were related to trauma. The patient tells me that 2 weeks ago she hit her lateral left leg on the car door. This was a skin tear with a flap for a period of time she has been using Polysporin. The flap came off recently she has a clean looking superficial wound on the left lateral leg. Our intake nurse reported some drainage. Past medical history; includes sensorineural hearing loss, osteoarthritis, hypertension and a hammertoe  on the right foot for which she follows with podiatry. ABIs in our clinic were 1.19 on the left 07/21/2019; the patient did not like the wraps. They slid down and rub the wound the wound is measuring larger she is upset. 10/12; left lateral leg wound. Most of this looks healthy even under illumination. We have been using Hydrofera Blue under border foam. She would not allow compression 10/19; left lateral leg wound. 2 small open areas remain of the original wound. We have been using Hydrofera Blue under border foam. This seems to be making decent progress 10/26 left lateral leg traumatic wound. There is no open wound remaining. We have been using Hydrofera Blue under border foam she arrived with some denuded epithelium that I removed there is no open wound remaining. Is fairly clear she has some degree of chronic venous insufficiency with not a lot of edema but dilated veins in her feet. She reminds me that she also has reactive arthritis and was on prednisone for a prolonged period of time. Nevertheless I think it would be beneficial for her to at least wear support stockings but right now I do not think she is going to agree to the Readmission: 02/18/2020 upon evaluation today patient has sustained a skin tear which occurred about 1 week ago she tells me. Fortunately there does not appear to be any signs of active infection at this time which is excellent news. She has been tolerating the dressing changes without complication. Overall very pleased with where things stand at this point. The only issue that I see here is that the skin flap somewhat folded back and then reattached further down causing an area that is actually bunched up to form where it closed on itself but then reattached on the end of the tissue. Nonetheless I think we have to trim off  the bunched up tissue in order to allow this to heal appropriately. That is the only issue I really see today. 03/10/2020 upon evaluation today patient  actually appears to be doing excellent at this time. She has healed quite nicely and has been a couple of weeks since [place] ([id number]) [phone].pdf Page 2 of 8 I saw her. Overall I feel like she is completely healed and ready for discharge as of today. Readmission: 03-21-2023 upon evaluation today patient appears to be doing somewhat poorly in regard to the left wound ulcer she tells me has been present for about a month. She tells me that she had this on the metal flatbed shopping carts at [facility] and subsequently has been having issues here with this since she tells me that the original Band-Aid she was using caused this to get worse. With that being said I do not see any evidence of active infection at this time everything seems to really be doing quite well as far as I am concerned. 03-28-2023 upon evaluation today patient appears to be doing well currently in regard to her wounds which in fact appear to be completely healed. I am very pleased with this. Readmission: 05-23-2023 upon evaluation patient presents for reevaluation here in the clinic concerning issues that she has been having with her left anterior lower extremity. This actually appears to have a significant amount of new skin growth over top of the areas of irritation I am not exactly sure what happened here to be perfectly honest. I explained to the patient that this is kind of an unusual presentation for this to be this irritated and yet not have any significant openings at this time. I do not see any evidence of active infection locally or systemically which is great news. 05-30-2023 upon evaluation today patient appears to be doing well currently in regard to her lower extremity wounds which are actually drying out the may be a little bit too dry. I think PolyMem may be better for her and we discussed that today. Fortunately I do not see any signs of active infection locally or systemically which is  great news. 06-06-2023 upon evaluation today patient appears to be doing well currently in regard to her wounds. Both are showing signs of improvement which is great news. Fortunately I do not see any signs of active infection locally or systemically at this time. 8/28; the distal wound on the left leg is healed she still has a comma shaped proximal wound medially. She reminds as she will be leaving for [place] on September night. The remaining wound is on the right upper medial calf a comma shaped wound this may be closed by that time. We had some discussion about compression stockings 06-20-2023 upon evaluation today patient appears to be doing well currently in regard to her wounds in fact most everything appears to be about healed although she does have some dry skin over top of the distal left lateral leg. The proximal medial leg is very close to closure and seems to be doing better. 07-11-2023 upon evaluation today patient appears to be doing well currently in regard to her wound. She has been tolerating the dressing changes without complication. Fortunately there does not appear to be any signs of infection locally or systemically which is great news. Unfortunately she has been having issues with colitis and she appears to be very pale today she also tells me that she has been feeling fairly poorly. Her rheumatologist to put her on  prednisone she is also been put on antibiotics for the colitis. Her wounds on the left anterior lower extremity have reinitiated a little bit here and I am beginning to suspect this may be more autoimmune related. 10-to-24 upon evaluation today patient appears to be doing well currently at this point with regard to her wound. She has been tolerating the dressing changes without complication. Fortunately I do not see any signs of worsening overall I believe that the patient is making good headway towards complete closure which is great news. Still have a little concerned  about the possibility of her having some issues here with a rheumatologic or dermatologic issue. I do believe that the steroid ointment has done a little bit better for her the triamcinolone I prescribed last week. 07-25-2023 upon evaluation today patient appears to be doing really about the same in regard to her wounds. The steroid ointment does not seem to make in a big difference unfortunately. Fortunately I do not see any signs of active infection locally or systemically which is good news. With that being said she does have 2 new pustules that have shown up she states they started looking like pimples initially and then have gotten worse since. With that being said I do believe that we need to see what we can do to try to get this under control she does have an appointment with dermatology next week in the meantime I am actually see about getting her started with a antibiotic ointment instead of the steroid. 08-01-2023 upon evaluation today patient appears to be doing well currently in regard to her left leg which is actually showing signs of improvement the antibiotic ointment actually seems to be doing quite well. I am actually very pleased with where we stand currently. Fortunately I do not see any signs of active infection locally or systemically at this time. No fevers, chills, nausea, vomiting, or diarrhea. Electronic Signature(s) Signed: 08/01/2023 10:55:01 AM By: Allen Derry PA-C Entered By: Allen Derry on 08/01/2023 10:55:01 -------------------------------------------------------------------------------- Physical Exam Details Patient Name: Date of Service: [place], [name] 08/01/2023 10:15 A M Medical Record Number: [id number] Patient Account Number: [id number] Date of Birth/Sex: Treating RN: 1933-05-01 (89 y.o. F) Primary Care Provider: [name] Other Clinician: Referring Provider: Treating Provider/Extender: [name] in Treatment:  10 Constitutional Well-nourished and well-hydrated in no acute distress. Respiratory normal breathing without difficulty. Psychiatric this patient is able to make decisions and demonstrates good insight into disease process. Alert and Oriented x 3. pleasant and cooperative. VONI, RUTTAN ([id number]) [id number] Page 3 of 8 Notes Upon inspection patient's wound bed actually showed signs of good granulation epithelization at this point. Fortunately I do not see any signs of worsening overall and I do believe that the patient is making excellent headway towards complete closure which is great news. With regard to her wound on the right lateral foot I do believe that this is actually going to be more of an issue with pressure and offloading I think that an offloading shoe would probably be the better way to go and I discussed that with her today as well. Electronic Signature(s) Signed: 08/01/2023 10:57:53 AM By: Allen Derry PA-C Entered By: Allen Derry on 08/01/2023 10:57:53 -------------------------------------------------------------------------------- Physician Orders Details Patient Name: Date of Service: [place], [name] 08/01/2023 10:15 A M Medical Record Number: [id number] Patient Account Number: [id number] Date of Birth/Sex: Treating RN: 03/19/1933 (89 y.o. Or[place] Govern Primary Care Provider: [name]  Other Clinician: Referring Provider: Treating Provider/Extender: [name] in Treatment: 10 Verbal / Phone Orders: No Diagnosis Coding ICD-10 Coding Code Description I87.332 Chronic venous hypertension (idiopathic) with ulcer and inflammation of left lower extremity L97.822 Non-pressure chronic ulcer of other part of left lower leg with fat layer exposed L89.893 Pressure ulcer of other site, stage 3 L97.822 Non-pressure chronic ulcer of other part of left lower leg with fat layer exposed I10 Essential (primary)  hypertension Follow-up Appointments ppointment in 1 week. - with Leonard Schwartz Wednesday Room 7 08/08/23 @ 1:15pm Return A ppointment in 2 weeks. Leonard Schwartz Wednesday room 7 Return A Return appointment in 3 weeks. Leonard Schwartz Wednesday - please schedule Bathing/ Shower/ Hygiene May shower and wash wound with soap and water. Edema Control - Lymphedema / SCD / Other Elevate legs to the level of the heart or above for 30 minutes daily and/or when sitting for 3-4 times a day throughout the day. Avoid standing for long periods of time. Exercise regularly Off-Loading Wound #10 Right,Lateral Foot Open toe surgical shoe to: - right foot Wound Treatment Wound #10 - Foot Wound Laterality: Right, Lateral Cleanser: Soap and Water 1 x Per Day/30 Days Discharge Instructions: May shower and wash wound with dial antibacterial soap and water prior to dressing change. Peri-Wound Care: Sween Lotion (Moisturizing lotion) 1 x Per Day/30 Days Discharge Instructions: Apply moisturizing lotion as directed Topical: Mupirocin Ointment 1 x Per Day/30 Days Discharge Instructions: Apply Mupirocin (Bactroban) as instructed Prim Dressing: PolyMem Non-Adhesive Dressing, 4x4 in (Generic) 1 x Per Day/30 Days ary Discharge Instructions: Apply to wound bed as instructed Secondary Dressing: Bordered Gauze, 2x3.75 in (Generic) 1 x Per Day/30 Days Discharge Instructions: Apply over primary dressing as directed. Secondary Dressing: Woven Gauze Sponge, Non-Sterile 4x4 in (Generic) 1 x Per Day/30 Days Discharge Instructions: Apply over primary dressing as directed. BRETTANY, TYRRELL ([id number]) [id number] Page 4 of 8 Wound #9 - Lower Leg Wound Laterality: Left, Anterior Cleanser: Soap and Water 1 x Per Day/30 Days Discharge Instructions: May shower and wash wound with dial antibacterial soap and water prior to dressing change. Peri-Wound Care: Sween Lotion (Moisturizing lotion) 1 x Per Day/30 Days Discharge  Instructions: Apply moisturizing lotion as directed Topical: Mupirocin Ointment 1 x Per Day/30 Days Discharge Instructions: Apply Mupirocin (Bactroban) as instructed Topical: Triamcinolone 1 x Per Day/30 Days Discharge Instructions: Apply Triamcinolone as directed Secured With: Conforming Stretch Gauze Bandage, Sterile 2x75 (in/in) 1 x Per Day/30 Days Discharge Instructions: Secure with stretch gauze as directed. Secured With: Paper Tape, 1x10 (in/yd) 1 x Per Day/30 Days Discharge Instructions: Secure dressing with tape as directed. Electronic Signature(s) Signed: 08/01/2023 4:26:00 PM By: Allen Derry PA-C Signed: 08/13/2023 4:05:37 PM By: Redmond Pulling RN, BSN Entered By: Redmond Pulling on 08/01/2023 10:51:59 -------------------------------------------------------------------------------- Problem List Details Patient Name: Date of Service: [place], [name] 08/01/2023 10:15 A M Medical Record Number: [id number] Patient Account Number: [id number] Date of Birth/Sex: Treating RN: 11-28-1932 (89 y.o. F) Primary Care Provider: [name] Other Clinician: Referring Provider: Treating Provider/Extender: [name] in Treatment: 10 Active Problems ICD-10 Encounter Code Description Active Date MDM Diagnosis I87.332 Chronic venous hypertension (idiopathic) with ulcer and inflammation of left 05/23/2023 No Yes lower extremity L97.822 Non-pressure chronic ulcer of other part of left lower leg with fat layer exposed8/04/2023 No Yes L89.893 Pressure ulcer of other site, stage 3 07/11/2023 No Yes L97.822 Non-pressure chronic ulcer of other part of left lower  leg with fat layer exposed8/04/2023 No Yes I10 Essential (primary) hypertension 05/23/2023 No Yes Inactive Problems Resolved Problems LELIANA, SEHNERT ([id number]) [phone].pdf Page 5 of 8 Electronic Signature(s) Signed: 08/01/2023 10:39:05 AM By: Allen Derry PA-C Entered By: Allen Derry  on 08/01/2023 10:39:05 -------------------------------------------------------------------------------- Progress Note Details Patient Name: Date of Service: [place], [name] 08/01/2023 10:15 A M Medical Record Number: [id number] Patient Account Number: [id number] Date of Birth/Sex: Treating RN: 1933/08/31 (89 y.o. F) Primary Care Provider: [name] Other Clinician: Referring Provider: Treating Provider/Extender: [name] in Treatment: 10 Subjective Chief Complaint Information obtained from Patient Left LE Ulcer and right lateral foot pressure ulcer History of Present Illness (HPI) READMISSION 07/15/2019 Patient is now an 89 year old woman who was previously here in 2016 and 2018. Cared for at the time by Dr. Meyer Russel. Both times with wounds related to trauma in the left leg. She was felt to have underlying venous insufficiency although both occasions were related to trauma. The patient tells me that 2 weeks ago she hit her lateral left leg on the car door. This was a skin tear with a flap for a period of time she has been using Polysporin. The flap came off recently she has a clean looking superficial wound on the left lateral leg. Our intake nurse reported some drainage. Past medical history; includes sensorineural hearing loss, osteoarthritis, hypertension and a hammertoe on the right foot for which she follows with podiatry. ABIs in our clinic were 1.19 on the left 07/21/2019; the patient did not like the wraps. They slid down and rub the wound the wound is measuring larger she is upset. 10/12; left lateral leg wound. Most of this looks healthy even under illumination. We have been using Hydrofera Blue under border foam. She would not allow compression 10/19; left lateral leg wound. 2 small open areas remain of the original wound. We have been using Hydrofera Blue under border foam. This seems to be making decent progress 10/26 left lateral leg  traumatic wound. There is no open wound remaining. We have been using Hydrofera Blue under border foam she arrived with some denuded epithelium that I removed there is no open wound remaining. Is fairly clear she has some degree of chronic venous insufficiency with not a lot of edema but dilated veins in her feet. She reminds me that she also has reactive arthritis and was on prednisone for a prolonged period of time. Nevertheless I think it would be beneficial for her to at least wear support stockings but right now I do not think she is going to agree to the Readmission: 02/18/2020 upon evaluation today patient has sustained a skin tear which occurred about 1 week ago she tells me. Fortunately there does not appear to be any signs of active infection at this time which is excellent news. She has been tolerating the dressing changes without complication. Overall very pleased with where things stand at this point. The only issue that I see here is that the skin flap somewhat folded back and then reattached further down causing an area that is actually bunched up to form where it closed on itself but then reattached on the end of the tissue. Nonetheless I think we have to trim off the bunched up tissue in order to allow this to heal appropriately. That is the only issue I really see today. 03/10/2020 upon evaluation today patient actually appears to be doing excellent at this time. She has healed quite nicely  and has been a couple of weeks since I saw her. Overall I feel like she is completely healed and ready for discharge as of today. Readmission: 03-21-2023 upon evaluation today patient appears to be doing somewhat poorly in regard to the left wound ulcer she tells me has been present for about a month. She tells me that she had this on the metal flatbed shopping carts at [facility] and subsequently has been having issues here with this since she tells me that the original Band-Aid she was using caused this  to get worse. With that being said I do not see any evidence of active infection at this time everything seems to really be doing quite well as far as I am concerned. 03-28-2023 upon evaluation today patient appears to be doing well currently in regard to her wounds which in fact appear to be completely healed. I am very pleased with this. Readmission: 05-23-2023 upon evaluation patient presents for reevaluation here in the clinic concerning issues that she has been having with her left anterior lower extremity. This actually appears to have a significant amount of new skin growth over top of the areas of irritation I am not exactly sure what happened here to be perfectly honest. I explained to the patient that this is kind of an unusual presentation for this to be this irritated and yet not have any significant openings at this time. I do not see any evidence of active infection locally or systemically which is great news. 05-30-2023 upon evaluation today patient appears to be doing well currently in regard to her lower extremity wounds which are actually drying out the may be a little bit too dry. I think PolyMem may be better for her and we discussed that today. Fortunately I do not see any signs of active infection locally or systemically which is great news. 06-06-2023 upon evaluation today patient appears to be doing well currently in regard to her wounds. Both are showing signs of improvement which is great news. Fortunately I do not see any signs of active infection locally or systemically at this time. YAMANI, CHEA ([id number]) [id number] Page 6 of 8 8/28; the distal wound on the left leg is healed she still has a comma shaped proximal wound medially. She reminds as she will be leaving for [place] on September night. The remaining wound is on the right upper medial calf a comma shaped wound this may be closed by that time. We had some discussion about compression  stockings 06-20-2023 upon evaluation today patient appears to be doing well currently in regard to her wounds in fact most everything appears to be about healed although she does have some dry skin over top of the distal left lateral leg. The proximal medial leg is very close to closure and seems to be doing better. 07-11-2023 upon evaluation today patient appears to be doing well currently in regard to her wound. She has been tolerating the dressing changes without complication. Fortunately there does not appear to be any signs of infection locally or systemically which is great news. Unfortunately she has been having issues with colitis and she appears to be very pale today she also tells me that she has been feeling fairly poorly. Her rheumatologist to put her on prednisone she is also been put on antibiotics for the colitis. Her wounds on the left anterior lower extremity have reinitiated a little bit here and I am beginning to suspect this may be more autoimmune related. 10-to-24 upon evaluation  today patient appears to be doing well currently at this point with regard to her wound. She has been tolerating the dressing changes without complication. Fortunately I do not see any signs of worsening overall I believe that the patient is making good headway towards complete closure which is great news. Still have a little concerned about the possibility of her having some issues here with a rheumatologic or dermatologic issue. I do believe that the steroid ointment has done a little bit better for her the triamcinolone I prescribed last week. 07-25-2023 upon evaluation today patient appears to be doing really about the same in regard to her wounds. The steroid ointment does not seem to make in a big difference unfortunately. Fortunately I do not see any signs of active infection locally or systemically which is good news. With that being said she does have 2 new pustules that have shown up she states they  started looking like pimples initially and then have gotten worse since. With that being said I do believe that we need to see what we can do to try to get this under control she does have an appointment with dermatology next week in the meantime I am actually see about getting her started with a antibiotic ointment instead of the steroid. 08-01-2023 upon evaluation today patient appears to be doing well currently in regard to her left leg which is actually showing signs of improvement the antibiotic ointment actually seems to be doing quite well. I am actually very pleased with where we stand currently. Fortunately I do not see any signs of active infection locally or systemically at this time. No fevers, chills, nausea, vomiting, or diarrhea. Objective Constitutional Well-nourished and well-hydrated in no acute distress. Vitals Time Taken: 10:20 AM, Height: 62 in, Weight: 105 lbs, BMI: 19.2, Temperature: 98 F, Pulse: 50 bpm, Respiratory Rate: 18 breaths/min, Blood Pressure: 157/63 mmHg. Respiratory normal breathing without difficulty. Psychiatric this patient is able to make decisions and demonstrates good insight into disease process. Alert and Oriented x 3. pleasant and cooperative. General Notes: Upon inspection patient's wound bed actually showed signs of good granulation epithelization at this point. Fortunately I do not see any signs of worsening overall and I do believe that the patient is making excellent headway towards complete closure which is great news. With regard to her wound on the right lateral foot I do believe that this is actually going to be more of an issue with pressure and offloading I think that an offloading shoe would probably be the better way to go and I discussed that with her today as well. Integumentary (Hair, Skin) Wound #10 status is Open. Original cause of wound was Gradually Appeared. The date acquired was: 06/19/2023. The wound has been in treatment 3 weeks.  The wound is located on the Right,Lateral Foot. The wound measures 0.5cm length x 0.4cm width x 0.1cm depth; 0.157cm^2 area and 0.016cm^3 volume. There is Fat Layer (Subcutaneous Tissue) exposed. There is no tunneling or undermining noted. There is a medium amount of serosanguineous drainage noted. The wound margin is distinct with the outline attached to the wound base. There is no granulation within the wound bed. There is a large (67-100%) amount of necrotic tissue within the wound bed including Adherent Slough. The periwound skin appearance had no abnormalities noted for moisture. The periwound skin appearance exhibited: Scarring. Periwound temperature was noted as No Abnormality. Wound #11 status is Healed - Epithelialized. Original cause of wound was Gradually Appeared. The date acquired  was: 07/23/2023. The wound has been in treatment 1 weeks. The wound is located on the Left,Proximal,Medial Lower Leg. The wound measures 0cm length x 0cm width x 0cm depth; 0cm^2 area and 0cm^3 volume. There is no tunneling or undermining noted. There is a none present amount of drainage noted. There is no granulation within the wound bed. There is no necrotic tissue within the wound bed. Wound #12 status is Healed - Epithelialized. Original cause of wound was Gradually Appeared. The date acquired was: 07/23/2023. The wound has been in treatment 1 weeks. The wound is located on the Left,Medial Lower Leg. The wound measures 0cm length x 0cm width x 0cm depth; 0cm^2 area and 0cm^3 volume. There is no tunneling or undermining noted. There is a none present amount of drainage noted. There is no granulation within the wound bed. There is no necrotic tissue within the wound bed. The periwound skin appearance did not exhibit: Callus, Crepitus, Excoriation, Induration, Rash, Scarring, Dry/Scaly, Maceration, Atrophie Blanche, Cyanosis, Ecchymosis, Hemosiderin Staining, Mottled, Pallor, Rubor, Erythema. Wound #13 status is  Healed - Epithelialized. Original cause of wound was Gradually Appeared. The date acquired was: 07/23/2023. The wound has been in treatment 1 weeks. The wound is located on the Left,Distal,Medial Lower Leg. The wound measures 0cm length x 0cm width x 0cm depth; 0cm^2 area and 0cm^3 volume. There is no tunneling or undermining noted. There is a none present amount of drainage noted. There is no granulation within the wound bed. There is no necrotic tissue within the wound bed. Wound #9 status is Open. Original cause of wound was Gradually Appeared. The date acquired was: 07/08/2023. The wound has been in treatment 3 weeks. The wound is located on the Left,Anterior Lower Leg. The wound measures 0.1cm length x 0.1cm width x 0.1cm depth; 0.008cm^2 area and 0.001cm^3 volume. There is Fat Layer (Subcutaneous Tissue) exposed. There is no tunneling or undermining noted. There is a none present amount of drainage noted. The wound margin is distinct with the outline attached to the wound base. There is large (67-100%) granulation within the wound bed. There is no necrotic tissue within the wound bed. The periwound skin appearance did not exhibit: Callus, Crepitus, Excoriation, Induration, Rash, Scarring, Dry/Scaly, Maceration, Atrophie Blanche, Cyanosis, Ecchymosis, Hemosiderin Staining, Mottled, Pallor, Rubor, Erythema. AUBRYELLA, HOOSE ([id number]) [id number] Page 7 of 8 Assessment Active Problems ICD-10 Chronic venous hypertension (idiopathic) with ulcer and inflammation of left lower extremity Non-pressure chronic ulcer of other part of left lower leg with fat layer exposed Pressure ulcer of other site, stage 3 Non-pressure chronic ulcer of other part of left lower leg with fat layer exposed Essential (primary) hypertension Plan Follow-up Appointments: Return Appointment in 1 week. - with Leonard Schwartz Wednesday Room 7 08/08/23 @ 1:15pm Return Appointment in 2 weeks. Leonard Schwartz Wednesday  room 7 Return appointment in 3 weeks. Leonard Schwartz Wednesday - please schedule Bathing/ Shower/ Hygiene: May shower and wash wound with soap and water. Edema Control - Lymphedema / SCD / Other: Elevate legs to the level of the heart or above for 30 minutes daily and/or when sitting for 3-4 times a day throughout the day. Avoid standing for long periods of time. Exercise regularly Off-Loading: Wound #10 Right,Lateral Foot: Open toe surgical shoe to: - right foot WOUND #10: - Foot Wound Laterality: Right, Lateral Cleanser: Soap and Water 1 x Per Day/30 Days Discharge Instructions: May shower and wash wound with dial antibacterial soap and water prior to dressing change. Peri-Wound Care: Donnal Moat (  Moisturizing lotion) 1 x Per Day/30 Days Discharge Instructions: Apply moisturizing lotion as directed Topical: Mupirocin Ointment 1 x Per Day/30 Days Discharge Instructions: Apply Mupirocin (Bactroban) as instructed Prim Dressing: PolyMem Non-Adhesive Dressing, 4x4 in (Generic) 1 x Per Day/30 Days ary Discharge Instructions: Apply to wound bed as instructed Secondary Dressing: Bordered Gauze, 2x3.75 in (Generic) 1 x Per Day/30 Days Discharge Instructions: Apply over primary dressing as directed. Secondary Dressing: Woven Gauze Sponge, Non-Sterile 4x4 in (Generic) 1 x Per Day/30 Days Discharge Instructions: Apply over primary dressing as directed. WOUND #9: - Lower Leg Wound Laterality: Left, Anterior Cleanser: Soap and Water 1 x Per Day/30 Days Discharge Instructions: May shower and wash wound with dial antibacterial soap and water prior to dressing change. Peri-Wound Care: Sween Lotion (Moisturizing lotion) 1 x Per Day/30 Days Discharge Instructions: Apply moisturizing lotion as directed Topical: Mupirocin Ointment 1 x Per Day/30 Days Discharge Instructions: Apply Mupirocin (Bactroban) as instructed Topical: Triamcinolone 1 x Per Day/30 Days Discharge Instructions: Apply Triamcinolone as  directed Secured With: Conforming Stretch Gauze Bandage, Sterile 2x75 (in/in) 1 x Per Day/30 Days Discharge Instructions: Secure with stretch gauze as directed. Secured With: Paper T ape, 1x10 (in/yd) 1 x Per Day/30 Days Discharge Instructions: Secure dressing with tape as directed. 1. I would recommend that we have the patient going continue to monitor for any signs of infection or worsening. Based on what I am seeing I do believe that we are making good headway towards complete closure which is great news. 2. I am going to recommend as well that the patient should continue to monitor for any new areas popping up on the left leg or using mupirocin ointment at this time. 3 with regard to the right lateral foot this is an area where we will get a going continue with the PolyMem on vomiting some new piercing down first we will get also go to an offloading shoe which I think is gena be the best way to go to try to keep pressure from causing ongoing issues here. We will see patient back for reevaluation in 1 week here in the clinic. If anything worsens or changes patient will contact our office for additional recommendations. Electronic Signature(s) Signed: 08/01/2023 10:59:09 AM By: Allen Derry PA-C Entered By: Allen Derry on 08/01/2023 10:59:08 SuperBill Details -------------------------------------------------------------------------------- Jenna Luo ([id number]) [id number] Page 8 of 8 Patient Name: Date of Service: Stasia, [name] 08/01/2023 Medical Record Number: [id number] Patient Account Number: [id number] Date of Birth/Sex: Treating RN: 07-06-1933 (89 y.o. F) Primary Care Provider: [name] Other Clinician: Referring Provider: Treating Provider/Extender: [name] in Treatment: 10 Diagnosis Coding ICD-10 Codes Code Description [phone] Chronic venous hypertension (idiopathic) with ulcer and inflammation of left  lower extremity L97.822 Non-pressure chronic ulcer of other part of left lower leg with fat layer exposed L89.893 Pressure ulcer of other site, stage 3 L97.822 Non-pressure chronic ulcer of other part of left lower leg with fat layer exposed I10 Essential (primary) hypertension Facility Procedures CPT4 Code Description Modifier Quantity [id number] - WOUND CARE VISIT-LEV 3 EST PT 1 Physician Procedures Quantity CPT4 Code Description Modifier [id number] - WC PHYS LEVEL 3 - EST PT 1 ICD-10 Diagnosis Description I87.332 Chronic venous hypertension (idiopathic) with ulcer and inflammation of left lower extremity L97.822 Non-pressure chronic ulcer of other part of left lower leg with fat layer exposed L89.893 Pressure ulcer of other site, stage 3 I10 Essential (primary) hypertension Electronic Signature(s) Signed: 08/01/2023 4:26:00  PM By: Allen Derry PA-C Signed: 08/13/2023 4:05:37 PM By: Redmond Pulling RN, BSN Previous Signature: 08/01/2023 10:59:20 AM Version By: Allen Derry PA-C Entered By: Redmond Pulling on 08/01/2023 12:41:22

## 2023-08-08 ENCOUNTER — Encounter (HOSPITAL_BASED_OUTPATIENT_CLINIC_OR_DEPARTMENT_OTHER): Payer: PPO | Admitting: Physician Assistant

## 2023-08-08 DIAGNOSIS — L89893 Pressure ulcer of other site, stage 3: Secondary | ICD-10-CM | POA: Diagnosis not present

## 2023-08-08 NOTE — Progress Notes (Addendum)
[name] ([id number]) [id number] Page 1 of 8 Visit Report for 08/08/2023 Chief Complaint Document Details Patient Name: Date of Service: [name], [name] [name] 08/08/2023 1:15 PM Medical Record Number: [id number] Patient Account Number: [id number] Date of Birth/Sex: Treating RN: [birth year] (89 y.o. F) Primary Care Provider: Thora [name] Other Clinician: Referring Provider: Treating Provider/Extender: [name]: 11 Information Obtained from: Patient Chief Complaint Left LE Ulcer and right lateral foot pressure ulcer Electronic Signature(s) Signed: 08/08/2023 1:29:30 PM By: [name] Derry PA-C Entered By: [name] on 08/08/2023 10:29:30 -------------------------------------------------------------------------------- HPI Details Patient Name: Date of Service: [name], [name] 08/08/2023 1:15 PM Medical Record Number: [id number] Patient Account Number: [id number] Date of Birth/Sex: Treating RN: 1933/07/03 (89 y.o. F) Primary Care Provider: Thora [name] Other Clinician: Referring Provider: Treating Provider/Extender: [name]: 11 History of Present Illness HPI Description: READMISSION 07/15/2019 Patient is now an 89 year old woman who was previously here in 2016 and 2018. Cared for at the time by Dr. Meyer Russel. Both times with wounds related to trauma in the left leg. She was felt to have underlying venous insufficiency although both occasions were related to trauma. The patient tells me that 2 weeks ago she hit her lateral left leg on the car door. This was a skin tear with a flap for a period of time she has been using Polysporin. The flap came off recently she has a clean looking superficial wound on the left lateral leg. Our intake nurse reported some drainage. Past medical history; includes sensorineural hearing loss, osteoarthritis, hypertension and a hammertoe on  the right foot for which she follows with podiatry. ABIs in our clinic were 1.19 on the left 07/21/2019; the patient did not like the wraps. They slid down and rub the wound the wound is measuring larger she is upset. 10/12; left lateral leg wound. Most of this looks healthy even under illumination. We have been using Hydrofera Blue under border foam. She would not allow compression 10/19; left lateral leg wound. 2 small open areas remain of the original wound. We have been using Hydrofera Blue under border foam. This seems to be making decent progress 10/26 left lateral leg traumatic wound. There is no open wound remaining. We have been using Hydrofera Blue under border foam she arrived with some denuded epithelium that I removed there is no open wound remaining. Is fairly clear she has some degree of chronic venous insufficiency with not a lot of edema but dilated veins in her feet. She reminds me that she also has reactive arthritis and was on prednisone for a prolonged period of time. Nevertheless I think it would be beneficial for her to at least wear support stockings but right now I do not think she is going to agree to the Readmission: 02/18/2020 upon evaluation today patient has sustained a skin tear which occurred about 1 week ago she tells me. Fortunately there does not appear to be any signs of active infection at this time which is excellent news. She has been tolerating the dressing changes without complication. Overall very pleased with where things stand at this point. The only issue that I see here is that the skin flap somewhat folded back and then reattached further down causing an area that is actually bunched up to form where it closed on itself but then reattached on the end of the tissue. Nonetheless I think we have to trim off the bunched  up tissue in order to allow this to heal appropriately. That is the only issue I really see today. 03/10/2020 upon evaluation today patient  actually appears to be doing excellent at this time. She has healed quite nicely and has been a couple of weeks since [place] ([id number]) [id number] Page 2 of 8 I saw her. Overall I feel like she is completely healed and ready for discharge as of today. Readmission: 03-21-2023 upon evaluation today patient appears to be doing somewhat poorly in regard to the left wound ulcer she tells me has been present for about a month. She tells me that she had this on the metal flatbed shopping carts at [facility] and subsequently has been having issues here with this since she tells me that the original Band-Aid she was using caused this to get worse. With that being said I do not see any evidence of active infection at this time everything seems to really be doing quite well as far as I am concerned. 03-28-2023 upon evaluation today patient appears to be doing well currently in regard to her wounds which in fact appear to be completely healed. I am very pleased with this. Readmission: 05-23-2023 upon evaluation patient presents for reevaluation here in the clinic concerning issues that she has been having with her left anterior lower extremity. This actually appears to have a significant amount of new skin growth over top of the areas of irritation I am not exactly sure what happened here to be perfectly honest. I explained to the patient that this is kind of an unusual presentation for this to be this irritated and yet not have any significant openings at this time. I do not see any evidence of active infection locally or systemically which is great news. 05-30-2023 upon evaluation today patient appears to be doing well currently in regard to her lower extremity wounds which are actually drying out the may be a little bit too dry. I think PolyMem may be better for her and we discussed that today. Fortunately I do not see any signs of active infection locally or systemically which is  great news. 06-06-2023 upon evaluation today patient appears to be doing well currently in regard to her wounds. Both are showing signs of improvement which is great news. Fortunately I do not see any signs of active infection locally or systemically at this time. 8/28; the distal wound on the left leg is healed she still has a comma shaped proximal wound medially. She reminds as she will be leaving for [place] on September night. The remaining wound is on the right upper medial calf a comma shaped wound this may be closed by that time. We had some discussion about compression stockings 06-20-2023 upon evaluation today patient appears to be doing well currently in regard to her wounds in fact most everything appears to be about healed although she does have some dry skin over top of the distal left lateral leg. The proximal medial leg is very close to closure and seems to be doing better. 07-11-2023 upon evaluation today patient appears to be doing well currently in regard to her wound. She has been tolerating the dressing changes without complication. Fortunately there does not appear to be any signs of infection locally or systemically which is great news. Unfortunately she has been having issues with colitis and she appears to be very pale today she also tells me that she has been feeling fairly poorly. Her rheumatologist to put her on prednisone she  is also been put on antibiotics for the colitis. Her wounds on the left anterior lower extremity have reinitiated a little bit here and I am beginning to suspect this may be more autoimmune related. 10-to-24 upon evaluation today patient appears to be doing well currently at this point with regard to her wound. She has been tolerating the dressing changes without complication. Fortunately I do not see any signs of worsening overall I believe that the patient is making good headway towards complete closure which is great news. Still have a little concerned  about the possibility of her having some issues here with a rheumatologic or dermatologic issue. I do believe that the steroid ointment has done a little bit better for her the triamcinolone I prescribed last week. 07-25-2023 upon evaluation today patient appears to be doing really about the same in regard to her wounds. The steroid ointment does not seem to make in a big difference unfortunately. Fortunately I do not see any signs of active infection locally or systemically which is good news. With that being said she does have 2 new pustules that have shown up she states they started looking like pimples initially and then have gotten worse since. With that being said I do believe that we need to see what we can do to try to get this under control she does have an appointment with dermatology next week in the meantime I am actually see about getting her started with a antibiotic ointment instead of the steroid. 08-01-2023 upon evaluation today patient appears to be doing well currently in regard to her left leg which is actually showing signs of improvement the antibiotic ointment actually seems to be doing quite well. I am actually very pleased with where we stand currently. Fortunately I do not see any signs of active infection locally or systemically at this time. No fevers, chills, nausea, vomiting, or diarrhea. 08-08-2023 upon evaluation today patient appears to be doing actually pretty well in regard to her wound compared to where we were previous. I do think that the erythema has improved the wound size is getting a little bit smaller as I see her each week and in general I think that we are on the right track. There is no signs of significant infection although there is erythema I really feel like this may be more pressure related as it is blanchable as opposed to infection. Nonetheless I been leery to go forward with doing an oral antibiotic due to the history with the GI upset that she had  more recently where she got very sick. Nonetheless if she is not significantly improved come next week we may need to consider going forward with the oral antibiotics just to be on the safe side for now we will continue topical mupirocin. Electronic Signature(s) Signed: 08/08/2023 2:19:47 PM By: [name] Derry PA-C Entered By: [name] on 08/08/2023 11:19:47 -------------------------------------------------------------------------------- Physical Exam Details Patient Name: Date of Service: [place], [name] 08/08/2023 1:15 PM Medical Record Number: [id number] Patient Account Number: [id number] Date of Birth/Sex: Treating RN: 1933-08-30 (89 y.o. F) Primary Care Provider: Thora [name] Other Clinician: Referring Provider: Treating Provider/Extender: [name]: 11 Constitutional Well-nourished and well-hydrated in no acute distress. [name], [name] ([id number]) [id number] Page 3 of 8 Respiratory normal breathing without difficulty. Psychiatric this patient is able to make decisions and demonstrates good insight into disease process. Alert and Oriented x 3. pleasant and cooperative. Notes Upon inspection patient's wound  on the right leg did not require sharp debridement this actually looks to be doing quite well and very pleased in that regard I do not see any signs of active infection at this time which is good news. Electronic Signature(s) Signed: 08/08/2023 2:20:09 PM By: [name] Derry PA-C Entered By: [name] on 08/08/2023 11:20:09 -------------------------------------------------------------------------------- Physician Orders Details Patient Name: Date of Service: [place], [name] 08/08/2023 1:15 PM Medical Record Number: [id number] Patient Account Number: [id number] Date of Birth/Sex: Treating RN: 1932/10/20 (89 y.o. [place] Govern Primary Care Provider: Thora [name] Other Clinician: Referring  Provider: Treating Provider/Extender: [name]: 11 Verbal / Phone Orders: No Diagnosis Coding ICD-10 Coding Code Description [phone] Chronic venous hypertension (idiopathic) with ulcer and inflammation of left lower extremity L97.822 Non-pressure chronic ulcer of other part of left lower leg with fat layer exposed L89.893 Pressure ulcer of other site, stage 3 L97.822 Non-pressure chronic ulcer of other part of left lower leg with fat layer exposed I10 Essential (primary) hypertension Follow-up Appointments ppointment in 1 week. - with Leonard Schwartz Wednesday Room 7 08/15/23 @ 12:30 Return A ppointment in 2 weeks. Leonard Schwartz Wednesday room 7 Return A Return appointment in 3 weeks. Leonard Schwartz Wednesday - please schedule Bathing/ Shower/ Hygiene May shower and wash wound with soap and water. Edema Control - Lymphedema / SCD / Other Elevate legs to the level of the heart or above for 30 minutes daily and/or when sitting for 3-4 times a day throughout the day. Avoid standing for long periods of time. Exercise regularly Off-Loading Wound #10 Right,Lateral Foot Open toe surgical shoe to: - right foot Wound [name] Wound #10 - Foot Wound Laterality: Right, Lateral Cleanser: Soap and Water 1 x Per Day/30 Days Discharge Instructions: May shower and wash wound with dial antibacterial soap and water prior to dressing change. Peri-Wound Care: Sween Lotion (Moisturizing lotion) 1 x Per Day/30 Days Discharge Instructions: Apply moisturizing lotion as directed Topical: Mupirocin Ointment 1 x Per Day/30 Days Discharge Instructions: Apply Mupirocin (Bactroban) as instructed Prim Dressing: Promogran Prisma Matrix, 4.34 (sq in) (silver collagen) 1 x Per Day/30 Days ary Discharge Instructions: Moisten collagen with saline or hydrogel Secondary Dressing: Bordered Gauze, 2x3.75 in (Generic) 1 x Per Day/30 Days [name], [name] ([id number])  [phone].pdf Page 4 of 8 Discharge Instructions: Apply over primary dressing as directed. Secondary Dressing: Woven Gauze Sponge, Non-Sterile 4x4 in (Generic) 1 x Per Day/30 Days Discharge Instructions: Apply over primary dressing as directed. Patient Medications llergies: Sulfa (Sulfonamide Antibiotics), latex A Notifications Medication Indication Start End 08/08/2023 lidocaine DOSE topical 5 % ointment - ointment topical once daily Electronic Signature(s) Signed: 08/08/2023 4:45:44 PM By: [name] Derry PA-C Signed: 08/09/2023 5:52:17 PM By: Redmond Pulling RN, BSN Entered By: Redmond Pulling on 08/08/2023 11:08:43 -------------------------------------------------------------------------------- Problem List Details Patient Name: Date of Service: [place], [name] 08/08/2023 1:15 PM Medical Record Number: [id number] Patient Account Number: [id number] Date of Birth/Sex: Treating RN: [birth year] (89 y.o. F) Primary Care Provider: Thora [name] Other Clinician: Referring Provider: Treating Provider/Extender: [name]: 11 Active Problems ICD-10 Encounter Code Description Active Date MDM Diagnosis I87.332 Chronic venous hypertension (idiopathic) with ulcer and inflammation of left 05/23/2023 No Yes lower extremity L97.822 Non-pressure chronic ulcer of other part of left lower leg with fat layer exposed8/04/2023 No Yes L89.893 Pressure ulcer of other site, stage 3 07/11/2023 No Yes L97.822 Non-pressure chronic ulcer of other part of left  lower leg with fat layer exposed8/04/2023 No Yes I10 Essential (primary) hypertension 05/23/2023 No Yes Inactive Problems Resolved Problems Electronic Signature(s) Signed: 08/08/2023 1:22:58 PM By: [name] Derry PA-C Entered By: [name] on 08/08/2023 10:22:57 [name] ([id number]) [phone].pdf Page 5 of  8 -------------------------------------------------------------------------------- Progress Note Details Patient Name: Date of Service: [place], [place] [name] 08/08/2023 1:15 PM Medical Record Number: [id number] Patient Account Number: [id number] Date of Birth/Sex: Treating RN: 1933/01/04 (89 y.o. F) Primary Care Provider: Thora [name] Other Clinician: Referring Provider: Treating Provider/Extender: [name]: 11 Subjective Chief Complaint Information obtained from Patient Left LE Ulcer and right lateral foot pressure ulcer History of Present Illness (HPI) READMISSION 07/15/2019 Patient is now an 89 year old woman who was previously here in 2016 and 2018. Cared for at the time by Dr. Meyer Russel. Both times with wounds related to trauma in the left leg. She was felt to have underlying venous insufficiency although both occasions were related to trauma. The patient tells me that 2 weeks ago she hit her lateral left leg on the car door. This was a skin tear with a flap for a period of time she has been using Polysporin. The flap came off recently she has a clean looking superficial wound on the left lateral leg. Our intake nurse reported some drainage. Past medical history; includes sensorineural hearing loss, osteoarthritis, hypertension and a hammertoe on the right foot for which she follows with podiatry. ABIs in our clinic were 1.19 on the left 07/21/2019; the patient did not like the wraps. They slid down and rub the wound the wound is measuring larger she is upset. 10/12; left lateral leg wound. Most of this looks healthy even under illumination. We have been using Hydrofera Blue under border foam. She would not allow compression 10/19; left lateral leg wound. 2 small open areas remain of the original wound. We have been using Hydrofera Blue under border foam. This seems to be making decent progress 10/26 left lateral leg traumatic wound. There is no  open wound remaining. We have been using Hydrofera Blue under border foam she arrived with some denuded epithelium that I removed there is no open wound remaining. Is fairly clear she has some degree of chronic venous insufficiency with not a lot of edema but dilated veins in her feet. She reminds me that she also has reactive arthritis and was on prednisone for a prolonged period of time. Nevertheless I think it would be beneficial for her to at least wear support stockings but right now I do not think she is going to agree to the Readmission: 02/18/2020 upon evaluation today patient has sustained a skin tear which occurred about 1 week ago she tells me. Fortunately there does not appear to be any signs of active infection at this time which is excellent news. She has been tolerating the dressing changes without complication. Overall very pleased with where things stand at this point. The only issue that I see here is that the skin flap somewhat folded back and then reattached further down causing an area that is actually bunched up to form where it closed on itself but then reattached on the end of the tissue. Nonetheless I think we have to trim off the bunched up tissue in order to allow this to heal appropriately. That is the only issue I really see today. 03/10/2020 upon evaluation today patient actually appears to be doing excellent at this time. She has healed quite nicely  and has been a couple of weeks since I saw her. Overall I feel like she is completely healed and ready for discharge as of today. Readmission: 03-21-2023 upon evaluation today patient appears to be doing somewhat poorly in regard to the left wound ulcer she tells me has been present for about a month. She tells me that she had this on the metal flatbed shopping carts at [facility] and subsequently has been having issues here with this since she tells me that the original Band-Aid she was using caused this to get worse. With that being  said I do not see any evidence of active infection at this time everything seems to really be doing quite well as far as I am concerned. 03-28-2023 upon evaluation today patient appears to be doing well currently in regard to her wounds which in fact appear to be completely healed. I am very pleased with this. Readmission: 05-23-2023 upon evaluation patient presents for reevaluation here in the clinic concerning issues that she has been having with her left anterior lower extremity. This actually appears to have a significant amount of new skin growth over top of the areas of irritation I am not exactly sure what happened here to be perfectly honest. I explained to the patient that this is kind of an unusual presentation for this to be this irritated and yet not have any significant openings at this time. I do not see any evidence of active infection locally or systemically which is great news. 05-30-2023 upon evaluation today patient appears to be doing well currently in regard to her lower extremity wounds which are actually drying out the may be a little bit too dry. I think PolyMem may be better for her and we discussed that today. Fortunately I do not see any signs of active infection locally or systemically which is great news. 06-06-2023 upon evaluation today patient appears to be doing well currently in regard to her wounds. Both are showing signs of improvement which is great news. Fortunately I do not see any signs of active infection locally or systemically at this time. 8/28; the distal wound on the left leg is healed she still has a comma shaped proximal wound medially. She reminds as she will be leaving for [place] on September night. The remaining wound is on the right upper medial calf a comma shaped wound this may be closed by that time. We had some discussion about compression stockings 06-20-2023 upon evaluation today patient appears to be doing well currently in regard to her wounds in  fact most everything appears to be about healed although she does have some dry skin over top of the distal left lateral leg. The proximal medial leg is very close to closure and seems to be doing better. [name], [name] ([id number]) [id number] Page 6 of 8 07-11-2023 upon evaluation today patient appears to be doing well currently in regard to her wound. She has been tolerating the dressing changes without complication. Fortunately there does not appear to be any signs of infection locally or systemically which is great news. Unfortunately she has been having issues with colitis and she appears to be very pale today she also tells me that she has been feeling fairly poorly. Her rheumatologist to put her on prednisone she is also been put on antibiotics for the colitis. Her wounds on the left anterior lower extremity have reinitiated a little bit here and I am beginning to suspect this may be more autoimmune related. 10-to-24 upon evaluation  today patient appears to be doing well currently at this point with regard to her wound. She has been tolerating the dressing changes without complication. Fortunately I do not see any signs of worsening overall I believe that the patient is making good headway towards complete closure which is great news. Still have a little concerned about the possibility of her having some issues here with a rheumatologic or dermatologic issue. I do believe that the steroid ointment has done a little bit better for her the triamcinolone I prescribed last week. 07-25-2023 upon evaluation today patient appears to be doing really about the same in regard to her wounds. The steroid ointment does not seem to make in a big difference unfortunately. Fortunately I do not see any signs of active infection locally or systemically which is good news. With that being said she does have 2 new pustules that have shown up she states they started looking like pimples  initially and then have gotten worse since. With that being said I do believe that we need to see what we can do to try to get this under control she does have an appointment with dermatology next week in the meantime I am actually see about getting her started with a antibiotic ointment instead of the steroid. 08-01-2023 upon evaluation today patient appears to be doing well currently in regard to her left leg which is actually showing signs of improvement the antibiotic ointment actually seems to be doing quite well. I am actually very pleased with where we stand currently. Fortunately I do not see any signs of active infection locally or systemically at this time. No fevers, chills, nausea, vomiting, or diarrhea. 08-08-2023 upon evaluation today patient appears to be doing actually pretty well in regard to her wound compared to where we were previous. I do think that the erythema has improved the wound size is getting a little bit smaller as I see her each week and in general I think that we are on the right track. There is no signs of significant infection although there is erythema I really feel like this may be more pressure related as it is blanchable as opposed to infection. Nonetheless I been leery to go forward with doing an oral antibiotic due to the history with the GI upset that she had more recently where she got very sick. Nonetheless if she is not significantly improved come next week we may need to consider going forward with the oral antibiotics just to be on the safe side for now we will continue topical mupirocin. Objective Constitutional Well-nourished and well-hydrated in no acute distress. Vitals Time Taken: 1:33 PM, Height: 62 in, Weight: 105 lbs, BMI: 19.2, Temperature: 98.4 F, Pulse: 62 bpm, Respiratory Rate: 18 breaths/min, Blood Pressure: 138/58 mmHg. Respiratory normal breathing without difficulty. Psychiatric this patient is able to make decisions and demonstrates  good insight into disease process. Alert and Oriented x 3. pleasant and cooperative. General Notes: Upon inspection patient's wound on the right leg did not require sharp debridement this actually looks to be doing quite well and very pleased in that regard I do not see any signs of active infection at this time which is good news. Integumentary (Hair, Skin) Wound #10 status is Open. Original cause of wound was Gradually Appeared. The date acquired was: 06/19/2023. The wound has been in [name] 4 weeks. The wound is located on the Right,Lateral Foot. The wound measures 0.5cm length x 0.5cm width x 0.2cm depth; 0.196cm^2 area and 0.039cm^3  volume. There is Fat Layer (Subcutaneous Tissue) exposed. There is no tunneling or undermining noted. There is a medium amount of serosanguineous drainage noted. The wound margin is distinct with the outline attached to the wound base. There is no granulation within the wound bed. There is a large (67-100%) amount of necrotic tissue within the wound bed including Adherent Slough. The periwound skin appearance had no abnormalities noted for moisture. The periwound skin appearance exhibited: Scarring. Periwound temperature was noted as No Abnormality. Wound #9 status is Healed - Epithelialized. Original cause of wound was Gradually Appeared. The date acquired was: 07/08/2023. The wound has been in [name] 4 weeks. The wound is located on the Left,Anterior Lower Leg. The wound measures 0cm length x 0cm width x 0cm depth; 0cm^2 area and 0cm^3 volume. There is no tunneling or undermining noted. There is a none present amount of drainage noted. The wound margin is distinct with the outline attached to the wound base. There is no granulation within the wound bed. There is no necrotic tissue within the wound bed. The periwound skin appearance did not exhibit: Callus, Crepitus, Excoriation, Induration, Rash, Scarring, Dry/Scaly, Maceration, Atrophie Blanche, Cyanosis,  Ecchymosis, Hemosiderin Staining, Mottled, Pallor, Rubor, Erythema. Assessment Active Problems ICD-10 Chronic venous hypertension (idiopathic) with ulcer and inflammation of left lower extremity Non-pressure chronic ulcer of other part of left lower leg with fat layer exposed Pressure ulcer of other site, stage 3 Non-pressure chronic ulcer of other part of left lower leg with fat layer exposed Essential (primary) hypertension [name], [name] ([id number]) [phone].pdf Page 7 of 8 Plan Follow-up Appointments: Return Appointment in 1 week. - with Leonard Schwartz Wednesday Room 7 08/15/23 @ 12:30 Return Appointment in 2 weeks. Leonard Schwartz Wednesday room 7 Return appointment in 3 weeks. Leonard Schwartz Wednesday - please schedule Bathing/ Shower/ Hygiene: May shower and wash wound with soap and water. Edema Control - Lymphedema / SCD / Other: Elevate legs to the level of the heart or above for 30 minutes daily and/or when sitting for 3-4 times a day throughout the day. Avoid standing for long periods of time. Exercise regularly Off-Loading: Wound #10 Right,Lateral Foot: Open toe surgical shoe to: - right foot The following medication(s) was prescribed: lidocaine topical 5 % ointment ointment topical once daily was prescribed at facility WOUND #10: - Foot Wound Laterality: Right, Lateral Cleanser: Soap and Water 1 x Per Day/30 Days Discharge Instructions: May shower and wash wound with dial antibacterial soap and water prior to dressing change. Peri-Wound Care: Sween Lotion (Moisturizing lotion) 1 x Per Day/30 Days Discharge Instructions: Apply moisturizing lotion as directed Topical: Mupirocin Ointment 1 x Per Day/30 Days Discharge Instructions: Apply Mupirocin (Bactroban) as instructed Prim Dressing: Promogran Prisma Matrix, 4.34 (sq in) (silver collagen) 1 x Per Day/30 Days ary Discharge Instructions: Moisten collagen with saline or hydrogel Secondary Dressing: Bordered Gauze, 2x3.75  in (Generic) 1 x Per Day/30 Days Discharge Instructions: Apply over primary dressing as directed. Secondary Dressing: Woven Gauze Sponge, Non-Sterile 4x4 in (Generic) 1 x Per Day/30 Days Discharge Instructions: Apply over primary dressing as directed. 1. I would recommend that we have the patient going continue to monitor for any signs of infection or worsening. Based on what I see I do believe that the patient is making good headway with the PolyMem although right now she is a little bit worried that is causing the redness. I did go through the pictures with her and show her that the redness was not being caused by the  PolyMem. With that being said I do think that trying a different dressing would not be a bad idea. I am going to suggest that we go ahead and see about getting her started on Prisma and the patient is in agreement with plan. 2. I would recommend that we continue with the mupirocin over top and over the erythema area. 3. Would recommend we continue with the bordered foam dressing to cover. We will see patient back for reevaluation in 1 week here in the clinic. If anything worsens or changes patient will contact our office for additional recommendations. Electronic Signature(s) Signed: 08/08/2023 2:21:12 PM By: [name] Derry PA-C Entered By: [name] on 08/08/2023 11:21:12 -------------------------------------------------------------------------------- SuperBill Details Patient Name: Date of Service: [place], [name] 08/08/2023 Medical Record Number: [id number] Patient Account Number: [id number] Date of Birth/Sex: Treating RN: [birth year] (89 y.o. Orville Govern Primary Care Provider: Thora [name] Other Clinician: Referring Provider: Treating Provider/Extender: [name]: 11 Diagnosis Coding ICD-10 Codes Code Description [phone] Chronic venous hypertension (idiopathic) with ulcer and inflammation of left lower  extremity L97.822 Non-pressure chronic ulcer of other part of left lower leg with fat layer exposed L89.893 Pressure ulcer of other site, stage 3 L97.822 Non-pressure chronic ulcer of other part of left lower leg with fat layer exposed I10 Essential (primary) hypertension Facility Procedures : [place], [place] DGU4 Code: [id number] RY L ([id number]) Description: 99213 - WOUND CARE VISIT-LEV 3 EST PT [phone] Modifier: [phone].pdf Page Quantity: 1 8 of 8 Physician Procedures : CPT4 Code Description Modifier [phone] 99213 - WC PHYS LEVEL 3 - EST PT ICD-10 Diagnosis Description I87.332 Chronic venous hypertension (idiopathic) with ulcer and inflammation of left lower extremity L97.822 Non-pressure chronic ulcer of other part  of left lower leg with fat layer exposed L89.893 Pressure ulcer of other site, stage 3 I10 Essential (primary) hypertension Quantity: 1 Electronic Signature(s) Signed: 08/08/2023 2:21:30 PM By: [name] Derry PA-C Entered By: [name] on 08/08/2023 11:21:30

## 2023-08-10 NOTE — Progress Notes (Signed)
MARJON, RITACCO ([id number]) [id number] Page 1 of 9 Visit Report for 08/08/2023 Arrival Information Details Patient Name: Date of Service: [place], [name] 08/08/2023 1:15 PM Medical Record Number: [id number] Patient Account Number: [id number] Date of Birth/Sex: Treating RN: 18-Jun-1933 (89 y.o. Dola Argyle, Lyla Son Primary Care Chuckie Mccathern: [name] Other Clinician: Referring Aram Domzalski: Treating Adeola Dennen/Extender: [name] in Treatment: 11 Visit Information History Since Last Visit Added or deleted any medications: No Patient Arrived: Ambulatory Any new allergies or adverse reactions: No Arrival Time: 13:32 Had a fall or experienced change in No Accompanied By: self activities of daily living that may affect Transfer Assistance: None risk of falls: Patient Identification Verified: Yes Signs or symptoms of abuse/neglect since last visito No Secondary Verification Process Completed: Yes Hospitalized since last visit: No Patient Requires Transmission-Based Precautions: No Implantable device outside of the clinic excluding No Patient Has Alerts: No cellular tissue based products placed in the center since last visit: Has Dressing in Place as Prescribed: Yes Has Compression in Place as Prescribed: Yes Pain Present Now: Yes Electronic Signature(s) Signed: 08/09/2023 5:52:17 PM By: Redmond Pulling RN, BSN Entered By: Redmond Pulling on 08/08/2023 10:33:28 -------------------------------------------------------------------------------- Clinic Level of Care Assessment Details Patient Name: Date of Service: [place], [name] 08/08/2023 1:15 PM Medical Record Number: [id number] Patient Account Number: [id number] Date of Birth/Sex: Treating RN: Oct 06, 1933 (89 y.o. Orville Govern Primary Care Corrion Stirewalt: [name] Other Clinician: Referring Tayllor Breitenstein: Treating Kalasia Crafton/Extender: [name] in  Treatment: 11 Clinic Level of Care Assessment Items TOOL 4 Quantity Score X- 1 0 Use when only an EandM is performed on FOLLOW-UP visit ASSESSMENTS - Nursing Assessment / Reassessment X- 1 10 Reassessment of Co-morbidities (includes updates in patient status) X- 1 5 Reassessment of Adherence to Treatment Plan ASSESSMENTS - Wound and Skin A ssessment / Reassessment X - Simple Wound Assessment / Reassessment - one wound 1 5 []  - 0 Complex Wound Assessment / Reassessment - multiple wounds []  - 0 Dermatologic / Skin Assessment (not related to wound area) ASSESSMENTS - Focused Assessment X- 1 5 Circumferential Edema Measurements - multi extremities []  - 0 Nutritional Assessment / Counseling / Intervention REXINE, HINGTGEN ([id number]) [id number] Page 2 of 9 []  - 0 Lower Extremity Assessment (monofilament, tuning fork, pulses) []  - 0 Peripheral Arterial Disease Assessment (using hand held doppler) ASSESSMENTS - Ostomy and/or Continence Assessment and Care []  - 0 Incontinence Assessment and Management []  - 0 Ostomy Care Assessment and Management (repouching, etc.) PROCESS - Coordination of Care X - Simple Patient / Family Education for ongoing care 1 15 []  - 0 Complex (extensive) Patient / Family Education for ongoing care X- 1 10 Staff obtains Chiropractor, Records, T Results / Process Orders est []  - 0 Staff telephones HHA, Nursing Homes / Clarify orders / etc []  - 0 Routine Transfer to another Facility (non-emergent condition) []  - 0 Routine Hospital Admission (non-emergent condition) []  - 0 New Admissions / Manufacturing engineer / Ordering NPWT Apligraf, etc. , []  - 0 Emergency Hospital Admission (emergent condition) []  - 0 Simple Discharge Coordination []  - 0 Complex (extensive) Discharge Coordination PROCESS - Special Needs []  - 0 Pediatric / Minor Patient Management []  - 0 Isolation Patient Management []  - 0 Hearing / Language / Visual  special needs []  - 0 Assessment of Community assistance (transportation, D/C planning, etc.) []  - 0 Additional assistance / Altered mentation []  - 0 Support Surface(s) Assessment (bed, cushion,  seat, etc.) INTERVENTIONS - Wound Cleansing / Measurement X - Simple Wound Cleansing - one wound 1 5 []  - 0 Complex Wound Cleansing - multiple wounds X- 1 5 Wound Imaging (photographs - any number of wounds) []  - 0 Wound Tracing (instead of photographs) X- 1 5 Simple Wound Measurement - one wound []  - 0 Complex Wound Measurement - multiple wounds INTERVENTIONS - Wound Dressings X - Small Wound Dressing one or multiple wounds 1 10 []  - 0 Medium Wound Dressing one or multiple wounds []  - 0 Large Wound Dressing one or multiple wounds X- 1 5 Application of Medications - topical []  - 0 Application of Medications - injection INTERVENTIONS - Miscellaneous []  - 0 External ear exam []  - 0 Specimen Collection (cultures, biopsies, blood, body fluids, etc.) []  - 0 Specimen(s) / Culture(s) sent or taken to Lab for analysis []  - 0 Patient Transfer (multiple staff / Nurse, adult / Similar devices) []  - 0 Simple Staple / Suture removal (25 or less) []  - 0 Complex Staple / Suture removal (26 or more) []  - 0 Hypo / Hyperglycemic Management (close monitor of Blood Glucose) SUMMIT, STRONACH L ([id number]) [id number] Page 3 of 9 []  - 0 Ankle / Brachial Index (ABI) - do not check if billed separately X- 1 5 Vital Signs Has the patient been seen at the hospital within the last three years: Yes Total Score: 85 Level Of Care: New/Established - Level 3 Electronic Signature(s) Signed: 08/09/2023 5:52:17 PM By: Redmond Pulling RN, BSN Entered By: Redmond Pulling on 08/08/2023 11:15:54 -------------------------------------------------------------------------------- Encounter Discharge Information Details Patient Name: Date of Service: [place], [name] 08/08/2023 1:15 PM Medical  Record Number: [id number] Patient Account Number: [id number] Date of Birth/Sex: Treating RN: [birth year] (89 y.o. Orville Govern Primary Care Lucielle Vokes: [name] Other Clinician: Referring Caeson Filippi: Treating Alisen Marsiglia/Extender: [name] in Treatment: 11 Encounter Discharge Information Items Discharge Condition: Stable Ambulatory Status: Ambulatory Discharge Destination: Home Transportation: Private Auto Accompanied By: self Schedule Follow-up Appointment: Yes Clinical Summary of Care: Patient Declined Electronic Signature(s) Signed: 08/09/2023 5:52:17 PM By: Redmond Pulling RN, BSN Entered By: Redmond Pulling on 08/08/2023 11:16:45 -------------------------------------------------------------------------------- Lower Extremity Assessment Details Patient Name: Date of Service: [place], [name] 08/08/2023 1:15 PM Medical Record Number: [id number] Patient Account Number: [id number] Date of Birth/Sex: Treating RN: 25-Nov-1932 (89 y.o. Orville Govern Primary Care Damiah Mcdonald: [name] Other Clinician: Referring Durwin Davisson: Treating Genesi Stefanko/Extender: [name] in Treatment: 11 Edema Assessment Assessed: [Left: No] [Right: No] Edema: [Left: N] [Right: o] Calf Left: Right: Point of Measurement: From Medial Instep 29 cm 28.7 cm Ankle Left: Right: Point of Measurement: From Medial Instep 16.5 cm 16 cm Vascular Assessment Left: [id number] Page 4 of 9Right:] Pulses: Dorsalis Pedis Palpable: [[id number] Page 4 of 9Yes] Extremity colors, hair growth, and conditions: Extremity Color: [[id number] Page 4 of 9Hyperpigmented] Hair Growth on Extremity: [phone].pdf Page 4 of 9Yes] Temperature of Extremity: [phone].pdf Page 4 of 9Warm] Capillary Refill:  [phone].pdf Page 4 of 9< 3 seconds] Dependent Rubor: [phone].pdf Page 4 of 9Yes No] Electronic Signature(s) Signed: 08/09/2023 5:52:17 PM By: Redmond Pulling RN, BSN Entered By: Redmond Pulling on 08/08/2023 10:36:06 -------------------------------------------------------------------------------- Multi-Disciplinary Care Plan Details Patient Name: Date of Service: [place], [name] 08/08/2023 1:15 PM Medical Record Number: [id number] Patient Account Number: [id number] Date of Birth/Sex: Treating RN: May 25, 1933 (89 y.o. Orville Govern Primary Care Domnique Vantine: [name] Other Clinician: Referring Maaz Spiering: Treating Lavern Maslow/Extender: Larina Bras  III, Parks Ranger, [name] in Treatment: 11 Multidisciplinary Care Plan reviewed with physician Active Inactive Abuse / Safety / Falls / Self Care Management Nursing Diagnoses: Potential for falls Goals: Patient/caregiver will verbalize/demonstrate measures taken to prevent injury and/or falls Date Initiated: 05/23/2023 Target Resolution Date: 08/20/2023 Goal Status: Active Interventions: Assess fall risk on admission and as needed Notes: Wound/Skin Impairment Nursing Diagnoses: Impaired tissue integrity Knowledge deficit related to ulceration/compromised skin integrity Goals: Patient/caregiver will verbalize understanding of skin care regimen Date Initiated: 05/23/2023 Target Resolution Date: 08/20/2023 Goal Status: Active Ulcer/skin breakdown will have a volume reduction of 30% by week 4 Date Initiated: 05/23/2023 Target Resolution Date: 08/20/2023 Goal Status: Active Interventions: Assess patient/caregiver ability to obtain necessary supplies Assess patient/caregiver ability to perform ulcer/skin care regimen upon admission and as needed Assess ulceration(s) every visit Provide education on ulcer and skin care Treatment Activities: Skin care regimen initiated : 05/23/2023 KYLANA, HASLETT ([id number]) [phone] Page 5 of 9 Topical wound management initiated : 05/23/2023 Notes: Electronic Signature(s) Signed: 08/09/2023 5:52:17 PM By: Redmond Pulling RN, BSN Entered By: Redmond Pulling on 08/08/2023 10:45:02 -------------------------------------------------------------------------------- Pain Assessment Details Patient Name: Date of Service: [place], [name] 08/08/2023 1:15 PM Medical Record Number: [id number] Patient Account Number: [id number] Date of Birth/Sex: Treating RN: June 23, 1933 (89 y.o. Orville Govern Primary Care Maeci Kalbfleisch: [name] Other Clinician: Referring Estus Krakowski: Treating Christ Fullenwider/Extender: [name] in Treatment: 11 Active Problems Location of Pain Severity and Description of Pain Patient Has Paino Yes Site Locations Rate the pain. Current Pain Level: 4 Worst Pain Level: 10 Character of Pain Describe the Pain: Stabbing Pain Management and Medication Current Pain Management: Electronic Signature(s) Signed: 08/09/2023 5:52:17 PM By: Redmond Pulling RN, BSN Entered By: Redmond Pulling on 08/08/2023 10:35:31 -------------------------------------------------------------------------------- Patient/Caregiver Education Details Patient Name: Date of Service: [place] Mat[place] 10/23/2024andnbsp1:15 PM Medical Record Number: [id number] Patient Account Number: [id number] Date of Birth/Gender: Treating RN: 25-Sep-1933 (89 y.o. Orville Govern Primary Care Physician: [name] Other Clinician: Referring Physician: Treating Physician/Extender: Alan Mulder Mingo, [place] ([id number]) [id number] Page 6 of 9 Weeks in Treatment: 11 Education Assessment Education Provided To: Patient Education Topics Provided Wound/Skin Impairment: Methods: Explain/Verbal Responses: State content correctly [employer]) Signed: 08/09/2023  5:52:17 PM By: Redmond Pulling RN, BSN Entered By: Redmond Pulling on 08/08/2023 10:45:34 -------------------------------------------------------------------------------- Wound Assessment Details Patient Name: Date of Service: [place], [name] 08/08/2023 1:15 PM Medical Record Number: [id number] Patient Account Number: [id number] Date of Birth/Sex: Treating RN: [birth year] (89 y.o. Orville Govern Primary Care Aalivia Mcgraw: [name] Other Clinician: Referring Bibiana Gillean: Treating Atira Borello/Extender: [name] in Treatment: 11 Wound Status Wound Number: 10 Primary Pressure Ulcer Etiology: Wound Location: Right, Lateral Foot Wound Status: Open Wounding Event: Gradually Appeared Comorbid Cataracts, Hypertension, Raynauds, Rheumatoid Arthritis, Date Acquired: 06/19/2023 History: Osteoarthritis Weeks Of Treatment: 4 Clustered Wound: No Photos Wound Measurements Length: (cm) 0.5 Width: (cm) 0.5 Depth: (cm) 0.2 Area: (cm) 0.196 Volume: (cm) 0.039 % Reduction in Area: 40.6% % Reduction in Volume: 40.9% Epithelialization: Small (1-33%) Tunneling: No Undermining: No Wound Description Classification: Category/Stage III Wound Margin: Distinct, outline attached Exudate Amount: Medium Exudate Type: Serosanguineous Exudate Color: red, brown Foul Odor After Cleansing: No Slough/Fibrino Yes Wound Bed AVEENA, DEPHILLIPS ([id number]) [phone] Page 7 of 9 Granulation Amount: None Present (0%) Exposed Structure Necrotic Amount: Large (67-100%) Fascia Exposed: No Necrotic Quality: Adherent Slough Fat Layer (Subcutaneous Tissue)  Exposed: Yes Tendon Exposed: No Muscle Exposed: No Joint Exposed: No Bone Exposed: No Periwound Skin Texture Texture Color No Abnormalities Noted: No No Abnormalities Noted: No Scarring: Yes Temperature / Pain Temperature: No Abnormality Moisture No Abnormalities Noted: Yes Treatment Notes Wound #10  (Foot) Wound Laterality: Right, Lateral Cleanser Soap and Water Discharge Instruction: May shower and wash wound with dial antibacterial soap and water prior to dressing change. Peri-Wound Care Sween Lotion (Moisturizing lotion) Discharge Instruction: Apply moisturizing lotion as directed Topical Mupirocin Ointment Discharge Instruction: Apply Mupirocin (Bactroban) as instructed Primary Dressing Promogran Prisma Matrix, 4.34 (sq in) (silver collagen) Discharge Instruction: Moisten collagen with saline or hydrogel Secondary Dressing Bordered Gauze, 2x3.75 in Discharge Instruction: Apply over primary dressing as directed. Woven Gauze Sponge, Non-Sterile 4x4 in Discharge Instruction: Apply over primary dressing as directed. Secured With Compression Wrap Compression Stockings Facilities manager) Signed: 08/09/2023 5:52:17 PM By: Redmond Pulling RN, BSN Entered By: Redmond Pulling on 08/08/2023 10:42:46 -------------------------------------------------------------------------------- Wound Assessment Details Patient Name: Date of Service: [place], [name] 08/08/2023 1:15 PM Medical Record Number: [id number] Patient Account Number: [id number] Date of Birth/Sex: Treating RN: May 11, 1933 (89 y.o. Orville Govern Primary Care Lametria Klunk: [name] Other Clinician: Referring Shamir Tuzzolino: Treating Kazden Largo/Extender: [name] in Treatment: 11 Wound Status Wound Number: 9 Primary Venous Leg Ulcer Etiology: Wound Location: Left, Anterior Lower Leg Wound Status: Healed - Epithelialized Wounding Event: Gradually Appeared Comorbid Cataracts, Hypertension, Raynauds, Rheumatoid Arthritis, Date Acquired: 07/08/2023 SCARLETT, BARTHOLOMEW ([id number]) [phone].pdf Page 8 of 9 Date Acquired: 07/08/2023 History: Osteoarthritis Weeks Of Treatment: 4 Clustered Wound: No Photos Wound Measurements Length: (cm) Width: (cm) Depth:  (cm) Area: (cm) Volume: (cm) 0 % Reduction in Area: 100% 0 % Reduction in Volume: 100% 0 Epithelialization: Large (67-100%) 0 Tunneling: No 0 Undermining: No Wound Description Classification: Full Thickness Without Exposed Support S Wound Margin: Distinct, outline attached Exudate Amount: None Present tructures Foul Odor After Cleansing: No Slough/Fibrino No Wound Bed Granulation Amount: None Present (0%) Exposed Structure Necrotic Amount: None Present (0%) Fascia Exposed: No Fat Layer (Subcutaneous Tissue) Exposed: No Tendon Exposed: No Muscle Exposed: No Joint Exposed: No Bone Exposed: No Periwound Skin Texture Texture Color No Abnormalities Noted: No No Abnormalities Noted: No Callus: No Atrophie Blanche: No Crepitus: No Cyanosis: No Excoriation: No Ecchymosis: No Induration: No Erythema: No Rash: No Hemosiderin Staining: No Scarring: No Mottled: No Pallor: No Moisture Rubor: No No Abnormalities Noted: No Dry / Scaly: No Maceration: No Electronic Signature(s) Signed: 08/09/2023 5:52:17 PM By: Redmond Pulling RN, BSN Entered By: Redmond Pulling on 08/08/2023 10:43:38 -------------------------------------------------------------------------------- Vitals Details Patient Name: Date of Service: [place], [name] 08/08/2023 1:15 PM Medical Record Number: [id number] Patient Account Number: [id number] Date of Birth/Sex: Treating RN: Jun 25, 1933 (89 y.o. Orville Govern Primary Care Brighten Orndoff: [name] Other Clinician: Referring Harun Brumley: Treating Gram Siedlecki/Extender: Madelyn Flavors, [name] in Treatment: [address] [place] ([id number]) [id number] Page 9 of 9 Vital Signs Time Taken: 13:33 Temperature (F): 98.4 Height (in): 62 Pulse (bpm): 62 Weight (lbs): 105 Respiratory Rate (breaths/min): 18 Body Mass Index (BMI): 19.2 Blood Pressure (mmHg): 138/58 Reference Range: 80 - 120 mg / dl Electronic  Signature(s) Signed: 08/09/2023 5:52:17 PM By: Redmond Pulling RN, BSN Entered By: Redmond Pulling on 08/08/2023 10:34:05

## 2023-08-13 NOTE — Progress Notes (Signed)
[name] ([id number]) [id number] Page 1 of 11 Visit Report for 08/01/2023 Arrival Information Details Patient Name: Date of Service: [name], [name] 08/01/2023 10:15 A M Medical Record Number: [id number] Patient Account Number: [id number] Date of Birth/Sex: Treating RN: [name] (89 y.o. F) Primary Care [name]: [name] Other Clinician: Referring [name]: Treating [name]/Extender: [name] in Treatment: 10 Visit Information History Since Last Visit Added or deleted any medications: No Patient Arrived: Ambulatory Any new allergies or adverse reactions: No Arrival Time: 10:18 Had a fall or experienced change in No Accompanied By: self activities of daily living that [name] affect Transfer Assistance: None risk of falls: Patient Identification Verified: Yes Signs or symptoms of abuse/neglect since last visito No Secondary Verification Process Completed: Yes Hospitalized since last visit: No Patient Requires Transmission-Based Precautions: No Implantable device outside of the clinic excluding No Patient Has Alerts: No cellular tissue based products placed in the center since last visit: Has Dressing in Place as Prescribed: Yes Pain Present Now: No Electronic Signature(s) Signed: 08/01/2023 4:39:33 PM By: [name] Entered By: [name] on 08/01/2023 10:20:14 -------------------------------------------------------------------------------- Clinic Level of Care Assessment Details Patient Name: Date of Service: [name], [name] 08/01/2023 10:15 A M Medical Record Number: [id number] Patient Account Number: [id number] Date of Birth/Sex: Treating RN: [name] (89 y.o. [name] Primary Care Rubel Heckard: [name] Other Clinician: Referring Gael Delude: Treating Treysen Sudbeck/Extender: [name] in Treatment: 10 Clinic Level of Care Assessment Items TOOL 4 Quantity  Score X- 1 0 Use when only an EandM is performed on FOLLOW-UP visit ASSESSMENTS - Nursing Assessment / Reassessment X- 1 10 Reassessment of Co-morbidities (includes updates in patient status) X- 1 5 Reassessment of Adherence to Treatment Plan ASSESSMENTS - Wound and Skin A ssessment / Reassessment []  - 0 Simple Wound Assessment / Reassessment - one wound X- 2 5 Complex Wound Assessment / Reassessment - multiple wounds []  - 0 Dermatologic / Skin Assessment (not related to wound area) ASSESSMENTS - Focused Assessment X- 2 5 Circumferential Edema Measurements - multi extremities []  - 0 Nutritional Assessment / Counseling / Intervention [name], [name] ([id number]) [phone].pdf Page 2 of 11 []  - 0 Lower Extremity Assessment (monofilament, tuning fork, pulses) []  - 0 Peripheral Arterial Disease Assessment (using hand held doppler) ASSESSMENTS - Ostomy and/or Continence Assessment and Care []  - 0 Incontinence Assessment and Management []  - 0 Ostomy Care Assessment and Management (repouching, etc.) PROCESS - Coordination of Care X - Simple Patient / Family Education for ongoing care 1 15 []  - 0 Complex (extensive) Patient / Family Education for ongoing care X- 1 10 Staff obtains Chiropractor, Records, T Results / Process Orders est []  - 0 Staff telephones HHA, Nursing Homes / Clarify orders / etc []  - 0 Routine Transfer to another Facility (non-emergent condition) []  - 0 Routine Hospital Admission (non-emergent condition) []  - 0 New Admissions / Manufacturing engineer / Ordering NPWT Apligraf, etc. , []  - 0 Emergency Hospital Admission (emergent condition) []  - 0 Simple Discharge Coordination []  - 0 Complex (extensive) Discharge Coordination PROCESS - Special Needs []  - 0 Pediatric / Minor Patient Management []  - 0 Isolation Patient Management []  - 0 Hearing / Language / Visual special needs []  - 0 Assessment of Community assistance  (transportation, D/C planning, etc.) []  - 0 Additional assistance / Altered mentation []  - 0 Support Surface(s) Assessment (bed, cushion, seat, etc.) INTERVENTIONS - Wound Cleansing / Measurement []  -  0 Simple Wound Cleansing - one wound X- 2 5 Complex Wound Cleansing - multiple wounds X- 1 5 Wound Imaging (photographs - any number of wounds) []  - 0 Wound Tracing (instead of photographs) []  - 0 Simple Wound Measurement - one wound X- 2 5 Complex Wound Measurement - multiple wounds INTERVENTIONS - Wound Dressings []  - 0 Small Wound Dressing one or multiple wounds X- 1 15 Medium Wound Dressing one or multiple wounds []  - 0 Large Wound Dressing one or multiple wounds []  - 0 Application of Medications - topical []  - 0 Application of Medications - injection INTERVENTIONS - Miscellaneous []  - 0 External ear exam []  - 0 Specimen Collection (cultures, biopsies, blood, body fluids, etc.) []  - 0 Specimen(s) / Culture(s) sent or taken to Lab for analysis []  - 0 Patient Transfer (multiple staff / Nurse, adult / Similar devices) []  - 0 Simple Staple / Suture removal (25 or less) []  - 0 Complex Staple / Suture removal (26 or more) []  - 0 Hypo / Hyperglycemic Management (close monitor of Blood Glucose) [name], [name] ([id number]) [id number] Page 3 of 11 []  - 0 Ankle / Brachial Index (ABI) - do not check if billed separately X- 1 5 Vital Signs Has the patient been seen at the hospital within the last three years: Yes Total Score: 105 Level Of Care: New/Established - Level 3 Electronic Signature(s) Signed: 08/13/2023 4:05:37 PM By: Redmond Pulling RN, BSN Entered By: Redmond Pulling on 08/01/2023 12:41:11 -------------------------------------------------------------------------------- Encounter Discharge Information Details Patient Name: Date of Service: [name], [name] 08/01/2023 10:15 A M Medical Record Number: [id number] Patient Account Number:  [id number] Date of Birth/Sex: Treating RN: [birth year] [name] (89 y.o. [name] Primary Care Zeven Kocak: [name] Other Clinician: Referring Beacher Every: Treating Elsia Lasota/Extender: [name] in Treatment: 10 Encounter Discharge Information Items Discharge Condition: Stable Ambulatory Status: Ambulatory Discharge Destination: Home Transportation: Private Auto Accompanied By: self Schedule Follow-up Appointment: Yes Clinical Summary of Care: Patient Declined Electronic Signature(s) Signed: 08/13/2023 4:05:37 PM By: Redmond Pulling RN, BSN Entered By: Redmond Pulling on 08/01/2023 12:44:36 -------------------------------------------------------------------------------- Lower Extremity Assessment Details Patient Name: Date of Service: [place], [name] 08/01/2023 10:15 A M Medical Record Number: [id number] Patient Account Number: [id number] Date of Birth/Sex: Treating RN: [birth year][name] (89 y.o. F) Primary Care Durwin Davisson: [name] Other Clinician: Referring Claretta Kendra: Treating Demoni Parmar/Extender: [name] in Treatment: 10 Edema Assessment Assessed: [Left: No] [Right: No] Edema: [Left: N] [Right: o] Calf Left: Right: Point of Measurement: From Medial Instep 29 cm 29.2 cm Ankle Left: Right: Point of Measurement: From Medial Instep 16.5 cm 16.6 cm Vascular Assessment CHANITA, HUBBY [name] ([id number]) [Right:[id number] Page 4 of 11] Extremity colors, hair growth, and conditions: Extremity Color: [Left:Hyperpigmented] Hair Growth on Extremity: [Left:Yes] Temperature of Extremity: [Left:Warm] Capillary Refill: [Left:< 3 seconds] Dependent Rubor: [Left:Yes No] Electronic Signature(s) Signed: 08/01/2023 4:39:33 PM By: [name] Entered By: [name] on 08/01/2023 10:29:04 -------------------------------------------------------------------------------- Multi-Disciplinary Care Plan  Details Patient Name: Date of Service: [place], [name] 08/01/2023 10:15 A M Medical Record Number: [id number] Patient Account Number: [id number] Date of Birth/Sex: Treating RN: [birth year] (89 y.o. [name] Primary Care Atlas Crossland: [name] Other Clinician: Referring Printice Hellmer: Treating Marcheta Horsey/Extender: [name] in Treatment: 10 Multidisciplinary Care Plan reviewed with physician Active Inactive Abuse / Safety / Falls / Self Care Management Nursing Diagnoses: Potential for falls Goals: Patient/caregiver will verbalize/demonstrate measures taken to prevent  injury and/or falls Date Initiated: 05/23/2023 Target Resolution Date: 08/20/2023 Goal Status: Active Interventions: Assess fall risk on admission and as needed Notes: Wound/Skin Impairment Nursing Diagnoses: Impaired tissue integrity Knowledge deficit related to ulceration/compromised skin integrity Goals: Patient/caregiver will verbalize understanding of skin care regimen Date Initiated: 05/23/2023 Target Resolution Date: 08/20/2023 Goal Status: Active Ulcer/skin breakdown will have a volume reduction of 30% by week 4 Date Initiated: 05/23/2023 Target Resolution Date: 08/20/2023 Goal Status: Active Interventions: Assess patient/caregiver ability to obtain necessary supplies Assess patient/caregiver ability to perform ulcer/skin care regimen upon admission and as needed Assess ulceration(s) every visit Provide education on ulcer and skin care Treatment Activities: Skin care regimen initiated : 05/23/2023 Topical wound management initiated : 05/23/2023 Notes: [name], [name] ([id number]) [phone].pdf Page 5 of 11 Electronic Signature(s) Signed: 08/13/2023 4:05:37 PM By: Redmond Pulling RN, BSN Entered By: Redmond Pulling on 08/01/2023 10:45:11 -------------------------------------------------------------------------------- Pain Assessment Details Patient Name:  Date of Service: [place], [name] 08/01/2023 10:15 A M Medical Record Number: [id number] Patient Account Number: [id number] Date of Birth/Sex: Treating RN: [name] (89 y.o. F) Primary Care Nataliyah Packham: [name] Other Clinician: Referring Yasha Tibbett: Treating Ebbie Cherry/Extender: [name] in Treatment: 10 Active Problems Location of Pain Severity and Description of Pain Patient Has Paino No Site Locations Pain Management and Medication Current Pain Management: Electronic Signature(s) Signed: 08/01/2023 4:39:33 PM By: [name] Entered By: [name] on 08/01/2023 10:22:45 -------------------------------------------------------------------------------- Patient/Caregiver Education Details Patient Name: Date of Service: Mardelle Matte 10/16/2024andnbsp10:15 A M Medical Record Number: [id number] Patient Account Number: [id number] Date of Birth/Gender: Treating RN: 17-[name]-[name] (89 y.o. [name] Primary Care Physician: [name] Other Clinician: Referring Physician: Treating Physician/Extender: [name] in Treatment: 10 Education Assessment Education Provided To: Patient CELESE, UNDERKOFFLER ([id number]) [id number] Page 6 of 11 Education Topics Provided Wound/Skin Impairment: Methods: Explain/Verbal Responses: State content correctly Electronic Signature(s) Signed: 08/13/2023 4:05:37 PM By: Redmond Pulling RN, BSN Entered By: Redmond Pulling on 08/01/2023 10:45:46 -------------------------------------------------------------------------------- Wound Assessment Details Patient Name: Date of Service: [place], [name] 08/01/2023 10:15 A M Medical Record Number: [id number] Patient Account Number: [id number] Date of Birth/Sex: Treating RN: [birth year] [name] (89 y.o. F) Primary Care Dannette Kinkaid: [name] Other Clinician: Referring Allysen Lazo: Treating Awilda Covin/Extender: [name] in Treatment: 10 Wound Status Wound Number: 10 Primary Pressure Ulcer Etiology: Wound Location: Right, Lateral Foot Wound Status: Open Wounding Event: Gradually Appeared Comorbid Cataracts, Hypertension, Raynauds, Rheumatoid Arthritis, Date Acquired: 06/19/2023 History: Osteoarthritis Weeks Of Treatment: 3 Clustered Wound: No Photos Wound Measurements Length: (cm) 0.5 Width: (cm) 0.4 Depth: (cm) 0.1 Area: (cm) 0.157 Volume: (cm) 0.016 % Reduction in Area: 52.4% % Reduction in Volume: 75.8% Epithelialization: Small (1-33%) Tunneling: No Undermining: No Wound Description Classification: Category/Stage III Wound Margin: Distinct, outline attached Exudate Amount: Medium Exudate Type: Serosanguineous Exudate Color: red, brown Foul Odor After Cleansing: No Slough/Fibrino Yes Wound Bed Granulation Amount: None Present (0%) Exposed Structure Necrotic Amount: Large (67-100%) Fascia Exposed: No Necrotic Quality: Adherent Slough Fat Layer (Subcutaneous Tissue) Exposed: Yes Tendon Exposed: No Muscle Exposed: No Joint Exposed: No Bone Exposed: No [name], [name] ([id number]) [phone].pdf Page 7 of 11 Periwound Skin Texture Texture Color No Abnormalities Noted: No No Abnormalities Noted: No Scarring: Yes Temperature / Pain Temperature: No Abnormality Moisture No Abnormalities Noted: Yes Electronic Signature(s) Signed: 08/01/2023 4:39:33 PM By: [name] Entered By: [name] on 08/01/2023 10:35:56 -------------------------------------------------------------------------------- Wound Assessment Details  Patient Name: Date of Service: [place], [name] 08/01/2023 10:15 A M Medical Record Number: [id number] Patient Account Number: [id number] Date of Birth/Sex: Treating RN: [name][birth year] (89 y.o. F) Primary Care Aloura Matsuoka: [name] Other Clinician: Referring Simone Rodenbeck: Treating Juanelle Trueheart/Extender: [name] in Treatment: 10 Wound Status Wound Number: 11 Primary Lesion Etiology: Wound Location: Left, Proximal, Medial Lower Leg Wound Status: Healed - Epithelialized Wounding Event: Gradually Appeared Comorbid Cataracts, Hypertension, Raynauds, Rheumatoid Arthritis, Date Acquired: 07/23/2023 History: Osteoarthritis Weeks Of Treatment: 1 Clustered Wound: No Photos Wound Measurements Length: (cm) Width: (cm) Depth: (cm) Area: (cm) Volume: (cm) 0 % Reduction in Area: 100% 0 % Reduction in Volume: 100% 0 Epithelialization: Large (67-100%) 0 Tunneling: No 0 Undermining: No Wound Description Classification: Full Thickness Without Exposed Support Structures Exudate Amount: None Present Foul Odor After Cleansing: No Slough/Fibrino No Wound Bed Granulation Amount: None Present (0%) Exposed Structure Necrotic Amount: None Present (0%) Fascia Exposed: No Fat Layer (Subcutaneous Tissue) Exposed: No Tendon Exposed: No Muscle Exposed: No Joint Exposed: No Bone Exposed: No [address] [name], [name] ([id number]) [phone].pdf Page 8 of 11 Texture Color No Abnormalities Noted: No No Abnormalities Noted: No Moisture No Abnormalities Noted: No Electronic Signature(s) Signed: 08/13/2023 4:05:37 PM By: Redmond Pulling RN, BSN Entered By: Redmond Pulling on 08/01/2023 10:48:18 -------------------------------------------------------------------------------- Wound Assessment Details Patient Name: Date of Service: [place], [name] 08/01/2023 10:15 A M Medical Record Number: [id number] Patient Account Number: [id number] Date of Birth/Sex: Treating RN: [birth year] (89 y.o. F) Primary Care Mayar Whittier: [name] Other Clinician: Referring Jahquez Steffler: Treating Rodolfo Notaro/Extender: [name] in Treatment: 10 Wound Status Wound Number: 12 Primary Lesion Etiology: Wound Location: Left, Medial Lower  Leg Wound Status: Healed - Epithelialized Wounding Event: Gradually Appeared Comorbid Cataracts, Hypertension, Raynauds, Rheumatoid Arthritis, Date Acquired: 07/23/2023 History: Osteoarthritis Weeks Of Treatment: 1 Clustered Wound: No Photos Wound Measurements Length: (cm) Width: (cm) Depth: (cm) Area: (cm) Volume: (cm) 0 % Reduction in Area: 100% 0 % Reduction in Volume: 100% 0 Epithelialization: Large (67-100%) 0 Tunneling: No 0 Undermining: No Wound Description Classification: Full Thickness Without Exposed Support Structures Exudate Amount: None Present Foul Odor After Cleansing: No Slough/Fibrino No Wound Bed Granulation Amount: None Present (0%) Exposed Structure Necrotic Amount: None Present (0%) Fascia Exposed: No Fat Layer (Subcutaneous Tissue) Exposed: No Tendon Exposed: No Muscle Exposed: No Joint Exposed: No Bone Exposed: No Periwound Skin Texture Texture Color No Abnormalities Noted: No No Abnormalities Noted: No Callus: No Atrophie BlancheBELVIE, [name] ([id number]) [id number].pdf Page 9 of 11 Crepitus: No Cyanosis: No Excoriation: No Ecchymosis: No Induration: No Erythema: No Rash: No Hemosiderin Staining: No Scarring: No Mottled: No Pallor: No Moisture Rubor: No No Abnormalities Noted: No Dry / Scaly: No Maceration: No Electronic Signature(s) Signed: 08/13/2023 4:05:37 PM By: Redmond Pulling RN, BSN Entered By: Redmond Pulling on 08/01/2023 10:48:48 -------------------------------------------------------------------------------- Wound Assessment Details Patient Name: Date of Service: [name], [name] 08/01/2023 10:15 A M Medical Record Number: [id number] Patient Account Number: [id number] Date of Birth/Sex: Treating RN: [name][birth year] (89 y.o. F) Primary Care Elizabelle Fite: [name] Other Clinician: Referring Dyann Goodspeed: Treating Rayonna Heldman/Extender: [name] in Treatment: 10 Wound  Status Wound Number: 13 Primary Lesion Etiology: Wound Location: Left, Distal, Medial Lower Leg Wound Status: Healed - Epithelialized Wounding Event: Gradually Appeared Comorbid Cataracts, Hypertension, Raynauds, Rheumatoid Arthritis, Date Acquired: 07/23/2023 History: Osteoarthritis Weeks Of Treatment: 1 Clustered Wound: No Photos Wound  Measurements Length: (cm) Width: (cm) Depth: (cm) Area: (cm) Volume: (cm) 0 % Reduction in Area: 100% 0 % Reduction in Volume: 100% 0 Epithelialization: Large (67-100%) 0 Tunneling: No 0 Undermining: No Wound Description Classification: Full Thickness Without Exposed Support Structures Exudate Amount: None Present Foul Odor After Cleansing: No Slough/Fibrino No Wound Bed Granulation Amount: None Present (0%) Exposed Structure Necrotic Amount: None Present (0%) Fascia Exposed: No Fat Layer (Subcutaneous Tissue) Exposed: No Tendon Exposed: No Muscle Exposed: No Joint Exposed: No Bone Exposed: No [name], [name] ([id number]) [phone].pdf Page 10 of 11 Periwound Skin Texture Texture Color No Abnormalities Noted: No No Abnormalities Noted: No Moisture No Abnormalities Noted: No Electronic Signature(s) Signed: 08/13/2023 4:05:37 PM By: Redmond Pulling RN, BSN Entered By: Redmond Pulling on 08/01/2023 10:49:59 -------------------------------------------------------------------------------- Wound Assessment Details Patient Name: Date of Service: [place], [name] 08/01/2023 10:15 A M Medical Record Number: [id number] Patient Account Number: [id number] Date of Birth/Sex: Treating RN: [birth year] (89 y.o. F) Primary Care Kaci Freel: [name] Other Clinician: Referring Kimberle Stanfill: Treating Eddye Broxterman/Extender: [name] in Treatment: 10 Wound Status Wound Number: 9 Primary Venous Leg Ulcer Etiology: Wound Location: Left, Anterior Lower Leg Wound Status: Open Wounding Event: Gradually  Appeared Comorbid Cataracts, Hypertension, Raynauds, Rheumatoid Arthritis, Date Acquired: 07/08/2023 History: Osteoarthritis Weeks Of Treatment: 3 Clustered Wound: No Photos Wound Measurements Length: (cm) 0.1 Width: (cm) 0.1 Depth: (cm) 0.1 Area: (cm) 0.008 Volume: (cm) 0.001 % Reduction in Area: 93.2% % Reduction in Volume: 91.7% Epithelialization: Large (67-100%) Tunneling: No Undermining: No Wound Description Classification: Full Thickness Without Exposed Support Structures Wound Margin: Distinct, outline attached Exudate Amount: None Present Foul Odor After Cleansing: No Slough/Fibrino No Wound Bed Granulation Amount: Large (67-100%) Exposed Structure Necrotic Amount: None Present (0%) Fascia Exposed: No Fat Layer (Subcutaneous Tissue) Exposed: Yes Tendon Exposed: No Muscle Exposed: No Joint Exposed: No Bone Exposed: No [address] [name], [name] ([id number]) [phone].pdf Page 11 of 11 No Abnormalities Noted: No No Abnormalities Noted: No Callus: No Atrophie Blanche: No Crepitus: No Cyanosis: No Excoriation: No Ecchymosis: No Induration: No Erythema: No Rash: No Hemosiderin Staining: No Scarring: No Mottled: No Pallor: No Moisture Rubor: No No Abnormalities Noted: No Dry / Scaly: No Maceration: No Electronic Signature(s) Signed: 08/13/2023 4:05:37 PM By: Redmond Pulling RN, BSN Entered By: Redmond Pulling on 08/01/2023 10:50:27 -------------------------------------------------------------------------------- Vitals Details Patient Name: Date of Service: [place], [name] 08/01/2023 10:15 A M Medical Record Number: [id number] Patient Account Number: [id number] Date of Birth/Sex: Treating RN: [birth year], [name] (89 y.o. F) Primary Care Sayf Kerner: [name] Other Clinician: Referring Daud Cayer: Treating Porcia Morganti/Extender: [name] in Treatment: 10 Vital Signs Time Taken:  10:20 Temperature (F): 98 Height (in): 62 Pulse (bpm): 50 Weight (lbs): 105 Respiratory Rate (breaths/min): 18 Body Mass Index (BMI): 19.2 Blood Pressure (mmHg): 157/63 Reference Range: 80 - 120 mg / dl Electronic Signature(s) Signed: 08/01/2023 4:39:33 PM By: [name] Entered By: [name] on 08/01/2023 10:24:01

## 2023-08-15 ENCOUNTER — Encounter (HOSPITAL_BASED_OUTPATIENT_CLINIC_OR_DEPARTMENT_OTHER): Payer: PPO | Admitting: Physician Assistant

## 2023-08-15 DIAGNOSIS — L89893 Pressure ulcer of other site, stage 3: Secondary | ICD-10-CM | POA: Diagnosis not present

## 2023-08-15 NOTE — Progress Notes (Signed)
[name], [name] ([id number]) [id number] Page 1 of 8 Visit Report for 08/15/2023 Chief Complaint Document Details Patient Name: Date of Service: [name], [name] [name] 08/15/2023 12:30 PM Medical Record Number: [id number] Patient Account Number: [id number] Date of Birth/Sex: Treating RN: 1933-08-21 (89 y.o. F) Primary Care Provider: [name] Other Clinician: Referring Provider: Treating Provider/Extender: [name] in Treatment: 12 Information Obtained from: Patient Chief Complaint Left LE Ulcer and right lateral foot pressure ulcer Electronic Signature(s) Signed: 08/15/2023 12:55:42 PM By: Allen Derry PA-C Entered By: Allen Derry on 08/15/2023 09:55:42 -------------------------------------------------------------------------------- Debridement Details Patient Name: Date of Service: [name], [name] 08/15/2023 12:30 PM Medical Record Number: [id number] Patient Account Number: [id number] Date of Birth/Sex: Treating RN: 1933/01/03 (89 y.o. [name], [name] Primary Care Provider: [name] Other Clinician: Referring Provider: Treating Provider/Extender: [name] in Treatment: 12 Debridement Performed for Assessment: Wound #10 Right,Lateral Foot Performed By: Physician Lenda Kelp, PA The following information was scribed by: Fonnie Mu The information was scribed for: Lenda Kelp Debridement Type: Debridement Level of Consciousness (Pre-procedure): Awake and Alert Pre-procedure Verification/Time Out Yes - 12:59 Taken: Start Time: 12:59 Pain Control: Lidocaine Percent of Wound Bed Debrided: 100% T Area Debrided (cm): otal 0.16 Tissue and other material debrided: Slough, Subcutaneous, Slough Level: Skin/Subcutaneous Tissue Debridement Description: Excisional Instrument: Curette Bleeding: Minimum Hemostasis Achieved: Pressure End Time: 12:59 Procedural Pain:  0 Post Procedural Pain: 0 Response to Treatment: Procedure was tolerated well Level of Consciousness (Post- Awake and Alert procedure): Post Debridement Measurements of Total Wound Length: (cm) 0.5 Stage: Category/Stage III Width: (cm) 0.4 Depth: (cm) 0.2 Volume: (cm) 0.031 [name], [name] ([id number]) [phone].pdf Page 2 of 8 Character of Wound/Ulcer Post Debridement: Improved Post Procedure Diagnosis Same as Pre-procedure Electronic Signature(s) Signed: 08/15/2023 4:22:52 PM By: Fonnie Mu RN Signed: 08/15/2023 4:33:40 PM By: Allen Derry PA-C Entered By: Fonnie Mu on 08/15/2023 10:00:02 -------------------------------------------------------------------------------- HPI Details Patient Name: Date of Service: [name], [name] 08/15/2023 12:30 PM Medical Record Number: [id number] Patient Account Number: [id number] Date of Birth/Sex: Treating RN: 1933-02-24 (89 y.o. F) Primary Care Provider: [name] Other Clinician: Referring Provider: Treating Provider/Extender: [name] in Treatment: 12 History of Present Illness HPI Description: READMISSION 07/15/2019 Patient is now an 89 year old woman who was previously here in 2016 and 2018. Cared for at the time by Dr. Meyer Russel. Both times with wounds related to trauma in the left leg. She was felt to have underlying venous insufficiency although both occasions were related to trauma. The patient tells me that 2 weeks ago she hit her lateral left leg on the car door. This was a skin tear with a flap for a period of time she has been using Polysporin. The flap came off recently she has a clean looking superficial wound on the left lateral leg. Our intake nurse reported some drainage. Past medical history; includes sensorineural hearing loss, osteoarthritis, hypertension and a hammertoe on the right foot for which she follows with podiatry. ABIs in our clinic were  1.19 on the left 07/21/2019; the patient did not like the wraps. They slid down and rub the wound the wound is measuring larger she is upset. 10/12; left lateral leg wound. Most of this looks healthy even under illumination. We have been using Hydrofera Blue under border foam. She would not allow compression 10/19; left lateral leg wound. 2 small open areas remain of the original  wound. We have been using Hydrofera Blue under border foam. This seems to be making decent progress 10/26 left lateral leg traumatic wound. There is no open wound remaining. We have been using Hydrofera Blue under border foam she arrived with some denuded epithelium that I removed there is no open wound remaining. Is fairly clear she has some degree of chronic venous insufficiency with not a lot of edema but dilated veins in her feet. She reminds me that she also has reactive arthritis and was on prednisone for a prolonged period of time. Nevertheless I think it would be beneficial for her to at least wear support stockings but right now I do not think she is going to agree to the Readmission: 02/18/2020 upon evaluation today patient has sustained a skin tear which occurred about 1 week ago she tells me. Fortunately there does not appear to be any signs of active infection at this time which is excellent news. She has been tolerating the dressing changes without complication. Overall very pleased with where things stand at this point. The only issue that I see here is that the skin flap somewhat folded back and then reattached further down causing an area that is actually bunched up to form where it closed on itself but then reattached on the end of the tissue. Nonetheless I think we have to trim off the bunched up tissue in order to allow this to heal appropriately. That is the only issue I really see today. 03/10/2020 upon evaluation today patient actually appears to be doing excellent at this time. She has healed quite nicely  and has been a couple of weeks since I saw her. Overall I feel like she is completely healed and ready for discharge as of today. Readmission: 03-21-2023 upon evaluation today patient appears to be doing somewhat poorly in regard to the left wound ulcer she tells me has been present for about a month. She tells me that she had this on the metal flatbed shopping carts at [facility] and subsequently has been having issues here with this since she tells me that the original Band-Aid she was using caused this to get worse. With that being said I do not see any evidence of active infection at this time everything seems to really be doing quite well as far as I am concerned. 03-28-2023 upon evaluation today patient appears to be doing well currently in regard to her wounds which in fact appear to be completely healed. I am very pleased with this. Readmission: 05-23-2023 upon evaluation patient presents for reevaluation here in the clinic concerning issues that she has been having with her left anterior lower extremity. This actually appears to have a significant amount of new skin growth over top of the areas of irritation I am not exactly sure what happened here to be perfectly honest. I explained to the patient that this is kind of an unusual presentation for this to be this irritated and yet not have any significant openings at this time. I do not see any evidence of active infection locally or systemically which is great news. 05-30-2023 upon evaluation today patient appears to be doing well currently in regard to her lower extremity wounds which are actually drying out the may be a little bit too dry. I think PolyMem may be better for her and we discussed that today. Fortunately I do not see any signs of active infection locally or systemically which is great news. [name], [name] ([id number]) [id number] Page 3 of  8 06-06-2023 upon evaluation today patient appears to be doing well  currently in regard to her wounds. Both are showing signs of improvement which is great news. Fortunately I do not see any signs of active infection locally or systemically at this time. 8/28; the distal wound on the left leg is healed she still has a comma shaped proximal wound medially. She reminds as she will be leaving for [place] on September night. The remaining wound is on the right upper medial calf a comma shaped wound this may be closed by that time. We had some discussion about compression stockings 06-20-2023 upon evaluation today patient appears to be doing well currently in regard to her wounds in fact most everything appears to be about healed although she does have some dry skin over top of the distal left lateral leg. The proximal medial leg is very close to closure and seems to be doing better. 07-11-2023 upon evaluation today patient appears to be doing well currently in regard to her wound. She has been tolerating the dressing changes without complication. Fortunately there does not appear to be any signs of infection locally or systemically which is great news. Unfortunately she has been having issues with colitis and she appears to be very pale today she also tells me that she has been feeling fairly poorly. Her rheumatologist to put her on prednisone she is also been put on antibiotics for the colitis. Her wounds on the left anterior lower extremity have reinitiated a little bit here and I am beginning to suspect this may be more autoimmune related. 10-to-24 upon evaluation today patient appears to be doing well currently at this point with regard to her wound. She has been tolerating the dressing changes without complication. Fortunately I do not see any signs of worsening overall I believe that the patient is making good headway towards complete closure which is great news. Still have a little concerned about the possibility of her having some issues here with a rheumatologic or  dermatologic issue. I do believe that the steroid ointment has done a little bit better for her the triamcinolone I prescribed last week. 07-25-2023 upon evaluation today patient appears to be doing really about the same in regard to her wounds. The steroid ointment does not seem to make in a big difference unfortunately. Fortunately I do not see any signs of active infection locally or systemically which is good news. With that being said she does have 2 new pustules that have shown up she states they started looking like pimples initially and then have gotten worse since. With that being said I do believe that we need to see what we can do to try to get this under control she does have an appointment with dermatology next week in the meantime I am actually see about getting her started with a antibiotic ointment instead of the steroid. 08-01-2023 upon evaluation today patient appears to be doing well currently in regard to her left leg which is actually showing signs of improvement the antibiotic ointment actually seems to be doing quite well. I am actually very pleased with where we stand currently. Fortunately I do not see any signs of active infection locally or systemically at this time. No fevers, chills, nausea, vomiting, or diarrhea. 08-08-2023 upon evaluation today patient appears to be doing actually pretty well in regard to her wound compared to where we were previous. I do think that the erythema has improved the wound size is getting a little bit  smaller as I see her each week and in general I think that we are on the right track. There is no signs of significant infection although there is erythema I really feel like this may be more pressure related as it is blanchable as opposed to infection. Nonetheless I been leery to go forward with doing an oral antibiotic due to the history with the GI upset that she had more recently where she got very sick. Nonetheless if she is not significantly  improved come next week we may need to consider going forward with the oral antibiotics just to be on the safe side for now we will continue topical mupirocin. 08-15-2023 upon evaluation today patient appears to be doing well currently in regard to her wound. She is actually showing signs of good improvement she does have some slough and biofilm noted we will get a going perform some debridement to clear away the slough and biofilm down to good subcutaneous tissue my hope is this will help to get the area to heal much more effectively and quickly with the collagen. Electronic Signature(s) Signed: 08/15/2023 1:15:20 PM By: Allen Derry PA-C Entered By: Allen Derry on 08/15/2023 10:15:20 -------------------------------------------------------------------------------- Physical Exam Details Patient Name: Date of Service: [place], [name] 08/15/2023 12:30 PM Medical Record Number: [id number] Patient Account Number: [id number] Date of Birth/Sex: Treating RN: 07-05-1933 (89 y.o. F) Primary Care Provider: [name] Other Clinician: Referring Provider: Treating Provider/Extender: [name] in Treatment: 12 Constitutional Well-nourished and well-hydrated in no acute distress. Respiratory normal breathing without difficulty. Psychiatric this patient is able to make decisions and demonstrates good insight into disease process. Alert and Oriented x 3. pleasant and cooperative. Notes Upon inspection patient's wound bed actually showed signs of good granulation and epithelization at this point. Fortunately I do not see any evidence of worsening overall and I do believe that the patient is making headway here towards closure which is great news. Electronic Signature(s) Signed: 08/15/2023 1:15:35 PM By: Allen Derry PA-C Entered By: Allen Derry on 08/15/2023 10:15:35 [name] ([id number]) [id number] Page 4 of  8 -------------------------------------------------------------------------------- Physician Orders Details Patient Name: Date of Service: [name], [name] [name] 08/15/2023 12:30 PM Medical Record Number: [id number] Patient Account Number: [id number] Date of Birth/Sex: Treating RN: August 15, 1933 (89 y.o. Toniann Fail Primary Care Provider: [name] Other Clinician: Referring Provider: Treating Provider/Extender: [name] in Treatment: 12 Verbal / Phone Orders: No Diagnosis Coding Follow-up Appointments ppointment in 1 week. - with [facility] Wednesday 08/22/23 @ 11:00 Rm #8 Return A Bathing/ Shower/ Hygiene May shower and wash wound with soap and water. Off-Loading Wound #10 Right,Lateral Foot Open toe surgical shoe to: - right foot Wound Treatment Wound #10 - Foot Wound Laterality: Right, Lateral Cleanser: Soap and Water 1 x Per Day/30 Days Discharge Instructions: May shower and wash wound with dial antibacterial soap and water prior to dressing change. Peri-Wound Care: Sween Lotion (Moisturizing lotion) 1 x Per Day/30 Days Discharge Instructions: Apply moisturizing lotion as directed Topical: Mupirocin Ointment 1 x Per Day/30 Days Discharge Instructions: Apply Mupirocin (Bactroban) as instructed Prim Dressing: Promogran Prisma Matrix, 4.34 (sq in) (silver collagen) 1 x Per Day/30 Days ary Discharge Instructions: Moisten collagen with saline or hydrogel Secondary Dressing: Zetuvit Plus Silicone Border Dressing 4x4 (in/in) 1 x Per Day/30 Days Discharge Instructions: Apply silicone border over primary dressing as directed. Electronic Signature(s) Signed: 08/15/2023 4:22:52 PM By: Fonnie Mu RN Signed: 08/15/2023  4:33:40 PM By: Allen Derry PA-C Entered By: Fonnie Mu on 08/15/2023 09:59:02 -------------------------------------------------------------------------------- Problem List Details Patient Name: Date of Service: [place], [name] 08/15/2023 12:30 PM Medical Record Number: [id number] Patient Account Number: [id number] Date of Birth/Sex: Treating RN: 1932/12/23 (89 y.o. F) Primary Care Provider: [name] Other Clinician: Referring Provider: Treating Provider/Extender: [name] in Treatment: 12 Active Problems ICD-10 Encounter Code Description Active Date MDM Diagnosis [name], [name] ([id number]) [id number] Page 5 of 8 [phone] Chronic venous hypertension (idiopathic) with ulcer and inflammation of left 05/23/2023 No Yes lower extremity L97.822 Non-pressure chronic ulcer of other part of left lower leg with fat layer exposed8/04/2023 No Yes L89.893 Pressure ulcer of other site, stage 3 07/11/2023 No Yes L97.822 Non-pressure chronic ulcer of other part of left lower leg with fat layer exposed8/04/2023 No Yes I10 Essential (primary) hypertension 05/23/2023 No Yes Inactive Problems Resolved Problems Electronic Signature(s) Signed: 08/15/2023 12:55:36 PM By: Allen Derry PA-C Entered By: Allen Derry on 08/15/2023 09:55:36 -------------------------------------------------------------------------------- Progress Note Details Patient Name: Date of Service: [place], [name] 08/15/2023 12:30 PM Medical Record Number: [id number] Patient Account Number: [id number] Date of Birth/Sex: Treating RN: 05-Feb-1933 (89 y.o. F) Primary Care Provider: [name] Other Clinician: Referring Provider: Treating Provider/Extender: [name] in Treatment: 12 Subjective Chief Complaint Information obtained from Patient Left LE Ulcer and right lateral foot pressure ulcer History of Present Illness (HPI) READMISSION 07/15/2019 Patient is now an 89 year old woman who was previously here in 2016 and 2018. Cared for at the time by Dr. Meyer Russel. Both times with wounds related to trauma in the left leg. She was felt to have underlying venous  insufficiency although both occasions were related to trauma. The patient tells me that 2 weeks ago she hit her lateral left leg on the car door. This was a skin tear with a flap for a period of time she has been using Polysporin. The flap came off recently she has a clean looking superficial wound on the left lateral leg. Our intake nurse reported some drainage. Past medical history; includes sensorineural hearing loss, osteoarthritis, hypertension and a hammertoe on the right foot for which she follows with podiatry. ABIs in our clinic were 1.19 on the left 07/21/2019; the patient did not like the wraps. They slid down and rub the wound the wound is measuring larger she is upset. 10/12; left lateral leg wound. Most of this looks healthy even under illumination. We have been using Hydrofera Blue under border foam. She would not allow compression 10/19; left lateral leg wound. 2 small open areas remain of the original wound. We have been using Hydrofera Blue under border foam. This seems to be making decent progress 10/26 left lateral leg traumatic wound. There is no open wound remaining. We have been using Hydrofera Blue under border foam she arrived with some denuded epithelium that I removed there is no open wound remaining. Is fairly clear she has some degree of chronic venous insufficiency with not a lot of edema but dilated veins in her feet. She reminds me that she also has reactive arthritis and was on prednisone for a prolonged period of time. Nevertheless I think it would be beneficial for her to at least wear support stockings but right now I do not think she is going to agree to the Readmission: 02/18/2020 upon evaluation today patient has sustained a skin tear which occurred about 1 week ago she tells  me. Fortunately there does not appear to be any [name], [name] ([id number]) [phone].pdf Page 6 of 8 signs of active infection at this time which is excellent news.  She has been tolerating the dressing changes without complication. Overall very pleased with where things stand at this point. The only issue that I see here is that the skin flap somewhat folded back and then reattached further down causing an area that is actually bunched up to form where it closed on itself but then reattached on the end of the tissue. Nonetheless I think we have to trim off the bunched up tissue in order to allow this to heal appropriately. That is the only issue I really see today. 03/10/2020 upon evaluation today patient actually appears to be doing excellent at this time. She has healed quite nicely and has been a couple of weeks since I saw her. Overall I feel like she is completely healed and ready for discharge as of today. Readmission: 03-21-2023 upon evaluation today patient appears to be doing somewhat poorly in regard to the left wound ulcer she tells me has been present for about a month. She tells me that she had this on the metal flatbed shopping carts at [facility] and subsequently has been having issues here with this since she tells me that the original Band-Aid she was using caused this to get worse. With that being said I do not see any evidence of active infection at this time everything seems to really be doing quite well as far as I am concerned. 03-28-2023 upon evaluation today patient appears to be doing well currently in regard to her wounds which in fact appear to be completely healed. I am very pleased with this. Readmission: 05-23-2023 upon evaluation patient presents for reevaluation here in the clinic concerning issues that she has been having with her left anterior lower extremity. This actually appears to have a significant amount of new skin growth over top of the areas of irritation I am not exactly sure what happened here to be perfectly honest. I explained to the patient that this is kind of an unusual presentation for this to be this irritated and yet  not have any significant openings at this time. I do not see any evidence of active infection locally or systemically which is great news. 05-30-2023 upon evaluation today patient appears to be doing well currently in regard to her lower extremity wounds which are actually drying out the may be a little bit too dry. I think PolyMem may be better for her and we discussed that today. Fortunately I do not see any signs of active infection locally or systemically which is great news. 06-06-2023 upon evaluation today patient appears to be doing well currently in regard to her wounds. Both are showing signs of improvement which is great news. Fortunately I do not see any signs of active infection locally or systemically at this time. 8/28; the distal wound on the left leg is healed she still has a comma shaped proximal wound medially. She reminds as she will be leaving for [place] on September night. The remaining wound is on the right upper medial calf a comma shaped wound this may be closed by that time. We had some discussion about compression stockings 06-20-2023 upon evaluation today patient appears to be doing well currently in regard to her wounds in fact most everything appears to be about healed although she does have some dry skin over top of the distal left lateral leg.  The proximal medial leg is very close to closure and seems to be doing better. 07-11-2023 upon evaluation today patient appears to be doing well currently in regard to her wound. She has been tolerating the dressing changes without complication. Fortunately there does not appear to be any signs of infection locally or systemically which is great news. Unfortunately she has been having issues with colitis and she appears to be very pale today she also tells me that she has been feeling fairly poorly. Her rheumatologist to put her on prednisone she is also been put on antibiotics for the colitis. Her wounds on the left anterior lower  extremity have reinitiated a little bit here and I am beginning to suspect this may be more autoimmune related. 10-to-24 upon evaluation today patient appears to be doing well currently at this point with regard to her wound. She has been tolerating the dressing changes without complication. Fortunately I do not see any signs of worsening overall I believe that the patient is making good headway towards complete closure which is great news. Still have a little concerned about the possibility of her having some issues here with a rheumatologic or dermatologic issue. I do believe that the steroid ointment has done a little bit better for her the triamcinolone I prescribed last week. 07-25-2023 upon evaluation today patient appears to be doing really about the same in regard to her wounds. The steroid ointment does not seem to make in a big difference unfortunately. Fortunately I do not see any signs of active infection locally or systemically which is good news. With that being said she does have 2 new pustules that have shown up she states they started looking like pimples initially and then have gotten worse since. With that being said I do believe that we need to see what we can do to try to get this under control she does have an appointment with dermatology next week in the meantime I am actually see about getting her started with a antibiotic ointment instead of the steroid. 08-01-2023 upon evaluation today patient appears to be doing well currently in regard to her left leg which is actually showing signs of improvement the antibiotic ointment actually seems to be doing quite well. I am actually very pleased with where we stand currently. Fortunately I do not see any signs of active infection locally or systemically at this time. No fevers, chills, nausea, vomiting, or diarrhea. 08-08-2023 upon evaluation today patient appears to be doing actually pretty well in regard to her wound compared to  where we were previous. I do think that the erythema has improved the wound size is getting a little bit smaller as I see her each week and in general I think that we are on the right track. There is no signs of significant infection although there is erythema I really feel like this may be more pressure related as it is blanchable as opposed to infection. Nonetheless I been leery to go forward with doing an oral antibiotic due to the history with the GI upset that she had more recently where she got very sick. Nonetheless if she is not significantly improved come next week we may need to consider going forward with the oral antibiotics just to be on the safe side for now we will continue topical mupirocin. 08-15-2023 upon evaluation today patient appears to be doing well currently in regard to her wound. She is actually showing signs of good improvement she does have some slough  and biofilm noted we will get a going perform some debridement to clear away the slough and biofilm down to good subcutaneous tissue my hope is this will help to get the area to heal much more effectively and quickly with the collagen. Objective Constitutional Well-nourished and well-hydrated in no acute distress. Vitals Time Taken: 12:45 PM, Height: 62 in, Weight: 105 lbs, BMI: 19.2, Temperature: 97.9 F, Pulse: 59 bpm, Respiratory Rate: 18 breaths/min, Blood Pressure: 135/64 mmHg. Respiratory normal breathing without difficulty. [name], [name] ([id number]) [id number] Page 7 of 8 Psychiatric this patient is able to make decisions and demonstrates good insight into disease process. Alert and Oriented x 3. pleasant and cooperative. General Notes: Upon inspection patient's wound bed actually showed signs of good granulation and epithelization at this point. Fortunately I do not see any evidence of worsening overall and I do believe that the patient is making headway here towards closure which is  great news. Integumentary (Hair, Skin) Wound #10 status is Open. Original cause of wound was Gradually Appeared. The date acquired was: 06/19/2023. The wound has been in treatment 5 weeks. The wound is located on the Right,Lateral Foot. The wound measures 0.5cm length x 0.4cm width x 0.2cm depth; 0.157cm^2 area and 0.031cm^3 volume. There is a medium amount of serosanguineous drainage noted. Assessment Active Problems ICD-10 Chronic venous hypertension (idiopathic) with ulcer and inflammation of left lower extremity Non-pressure chronic ulcer of other part of left lower leg with fat layer exposed Pressure ulcer of other site, stage 3 Non-pressure chronic ulcer of other part of left lower leg with fat layer exposed Essential (primary) hypertension Procedures Wound #10 Pre-procedure diagnosis of Wound #10 is a Pressure Ulcer located on the Right,Lateral Foot . There was a Excisional Skin/Subcutaneous Tissue Debridement with a total area of 0.16 sq cm performed by Lenda Kelp, PA. With the following instrument(s): Curette Material removed includes Subcutaneous Tissue and Slough and after achieving pain control using Lidocaine. No specimens were taken. A time out was conducted at 12:59, prior to the start of the procedure. A Minimum amount of bleeding was controlled with Pressure. The procedure was tolerated well with a pain level of 0 throughout and a pain level of 0 following the procedure. Post Debridement Measurements: 0.5cm length x 0.4cm width x 0.2cm depth; 0.031cm^3 volume. Post debridement Stage noted as Category/Stage III. Character of Wound/Ulcer Post Debridement is improved. Post procedure Diagnosis Wound #10: Same as Pre-Procedure Plan Follow-up Appointments: Return Appointment in 1 week. - with [facility] Wednesday 08/22/23 @ 11:00 Rm #8 Bathing/ Shower/ Hygiene: May shower and wash wound with soap and water. Off-Loading: Wound #10 Right,Lateral Foot: Open toe surgical shoe to: -  right foot WOUND #10: - Foot Wound Laterality: Right, Lateral Cleanser: Soap and Water 1 x Per Day/30 Days Discharge Instructions: May shower and wash wound with dial antibacterial soap and water prior to dressing change. Peri-Wound Care: Sween Lotion (Moisturizing lotion) 1 x Per Day/30 Days Discharge Instructions: Apply moisturizing lotion as directed Topical: Mupirocin Ointment 1 x Per Day/30 Days Discharge Instructions: Apply Mupirocin (Bactroban) as instructed Prim Dressing: Promogran Prisma Matrix, 4.34 (sq in) (silver collagen) 1 x Per Day/30 Days ary Discharge Instructions: Moisten collagen with saline or hydrogel Secondary Dressing: Zetuvit Plus Silicone Border Dressing 4x4 (in/in) 1 x Per Day/30 Days Discharge Instructions: Apply silicone border over primary dressing as directed. 1. Postdebridement the wound bed looks better and is a little bit deeper just due to the necrotic tissue cleaning out but at the  same time I think were pretty much nearing the base of the wound and it should start to feeling from this point going forward. This is excellent news and I discussed that with the patient today. 2. I would recommend the patient should continue to monitor for any signs of infection or worsening this includes the collagen moistened with saline and then subsequently the repierce and ointment over top and then the bordered foam dressing to cover. Next #3 she will continue with the postop offloading shoe as well which I think is doing quite well. We will see patient back for reevaluation in 1 week here in the clinic. If anything worsens or changes patient will contact our office for additional recommendations. Electronic Signature(s) Signed: 08/15/2023 1:16:16 PM By: Allen Derry PA-C Entered By: Allen Derry on 08/15/2023 10:16:15 [name] ([id number]) [id number] Page 8 of  8 -------------------------------------------------------------------------------- SuperBill Details Patient Name: Date of Service: [name], [name] [name] 08/15/2023 Medical Record Number: [id number] Patient Account Number: [id number] Date of Birth/Sex: Treating RN: 14-Jun-1933 (89 y.o. [name], [name] Primary Care Provider: [name] Other Clinician: Referring Provider: Treating Provider/Extender: [name] in Treatment: 12 Diagnosis Coding ICD-10 Codes Code Description [phone] Chronic venous hypertension (idiopathic) with ulcer and inflammation of left lower extremity L97.822 Non-pressure chronic ulcer of other part of left lower leg with fat layer exposed L89.893 Pressure ulcer of other site, stage 3 L97.822 Non-pressure chronic ulcer of other part of left lower leg with fat layer exposed I10 Essential (primary) hypertension Facility Procedures : CPT4 Code: [id number] Description: 11042 - DEB SUBQ TISSUE 20 SQ CM/< ICD-10 Diagnosis Description L89.893 Pressure ulcer of other site, stage 3 Modifier: Quantity: 1 Physician Procedures : CPT4 Code Description Modifier [id number] 11042 - WC PHYS SUBQ TISS 20 SQ CM ICD-10 Diagnosis Description L89.893 Pressure ulcer of other site, stage 3 Quantity: 1 Electronic Signature(s) Signed: 08/15/2023 1:16:25 PM By: Allen Derry PA-C Entered By: Allen Derry on 08/15/2023 10:16:25

## 2023-08-15 NOTE — Progress Notes (Signed)
[name], [name] ([id number]) [id number] Page 1 of 5 Visit Report for 08/15/2023 Arrival Information Details Patient Name: Date of Service: [name], [name] 08/15/2023 12:30 PM Medical Record Number: [id number] Patient Account Number: [id number] Date of Birth/Sex: Treating RN: 12/24/1932 (89 y.o. F) Primary Care [name]: [name] Other Clinician: Referring [name]: Treating [name]/Extender: [name] in Treatment: 12 Visit Information History Since Last Visit Added or deleted any medications: No Patient Arrived: Ambulatory Any new allergies or adverse reactions: No Arrival Time: 12:44 Had a fall or experienced change in No Accompanied By: self activities of daily living that may affect Transfer Assistance: None risk of falls: Patient Identification Verified: Yes Signs or symptoms of abuse/neglect since last visito No Secondary Verification Process Completed: Yes Hospitalized since last visit: No Patient Requires Transmission-Based Precautions: No Implantable device outside of the clinic excluding No Patient Has Alerts: No cellular tissue based products placed in the center since last visit: Pain Present Now: No Electronic Signature(s) Signed: 08/15/2023 1:48:48 PM By: [name] Entered By: [name] on 08/15/2023 09:45:04 -------------------------------------------------------------------------------- Encounter Discharge Information Details Patient Name: Date of Service: [name], [name] 08/15/2023 12:30 PM Medical Record Number: [id number] Patient Account Number: [id number] Date of Birth/Sex: Treating RN: May 08, 1933 (89 y.o. [name], [name] Primary Care Jenavie Stanczak: [name] Other Clinician: Referring Amad Mau: Treating [name]/Extender: [name] in Treatment: 12 Encounter Discharge Information Items Post Procedure Vitals Discharge Condition: Stable Temperature  (F): 98.7 Ambulatory Status: Ambulatory Pulse (bpm): 74 Discharge Destination: Home Respiratory Rate (breaths/min): 17 Transportation: Private Auto Blood Pressure (mmHg): 134/74 Accompanied By: self Schedule Follow-up Appointment: Yes Clinical Summary of Care: Patient Declined Electronic Signature(s) Signed: 08/15/2023 4:22:52 PM By: Fonnie Mu RN Entered By: Fonnie Mu on 08/15/2023 10:05:57 [name] ([id number]) [id number] Page 2 of 5 -------------------------------------------------------------------------------- Lower Extremity Assessment Details Patient Name: Date of Service: [name], [name] [name] 08/15/2023 12:30 PM Medical Record Number: [id number] Patient Account Number: [id number] Date of Birth/Sex: Treating RN: [birth year] (89 y.o. [name], [name] Primary Care Gerturde Kuba: [name] Other Clinician: Referring Aimee Timmons: Treating [name]/Extender: [name] in Treatment: 12 Edema Assessment Assessed: [Left: No] [Right: No] Edema: [Left: N] [Right: o] Calf Left: Right: Point of Measurement: From Medial Instep 29 cm 28.7 cm Ankle Left: Right: Point of Measurement: From Medial Instep 16.5 cm 16 cm Vascular Assessment Extremity colors, hair growth, and conditions: Extremity Color: [Left:Hyperpigmented] Hair Growth on Extremity: [Left:Yes] Temperature of Extremity: [Left:Warm] Capillary Refill: [Left:< 3 seconds] Dependent Rubor: [Left:Yes No] Electronic Signature(s) Signed: 08/15/2023 4:22:52 PM By: Fonnie Mu RN Entered By: Fonnie Mu on 08/15/2023 09:50:00 -------------------------------------------------------------------------------- Multi-Disciplinary Care Plan Details Patient Name: Date of Service: [name], [name] 08/15/2023 12:30 PM Medical Record Number: [id number] Patient Account Number: [id number] Date of Birth/Sex: Treating RN: Jan 04, 1933 (89 y.o. [name],  [name] Primary Care Londynn Sonoda: [name] Other Clinician: Referring Arminta Gamm: Treating Ursala Cressy/Extender: [name] in Treatment: 12 Multidisciplinary Care Plan reviewed with physician Active Inactive Wound/Skin Impairment Nursing Diagnoses: Impaired tissue integrity Knowledge deficit related to ulceration/compromised skin integrity Goals: Patient/caregiver will verbalize understanding of skin care regimen Date Initiated: 05/23/2023 Target Resolution Date: 08/20/2023 [name], [name] ([id number]) [id number].pdf Page 3 of 5 Goal Status: Active Ulcer/skin breakdown will have a volume reduction of 30% by week 4 Date Initiated: 05/23/2023 Target Resolution Date: 08/20/2023 Goal Status: Active Interventions: Assess patient/caregiver ability to obtain necessary supplies Assess  patient/caregiver ability to perform ulcer/skin care regimen upon admission and as needed Assess ulceration(s) every visit Provide education on ulcer and skin care Treatment Activities: Skin care regimen initiated : 05/23/2023 Topical wound management initiated : 05/23/2023 Notes: Electronic Signature(s) Signed: 08/15/2023 4:22:52 PM By: Fonnie Mu RN Entered By: Fonnie Mu on 08/15/2023 09:50:12 -------------------------------------------------------------------------------- Pain Assessment Details Patient Name: Date of Service: [name], [name] 08/15/2023 12:30 PM Medical Record Number: [id number] Patient Account Number: [id number] Date of Birth/Sex: Treating RN: 1933/09/28 (89 y.o. F) Primary Care Cloa [place]ong: [name] Other Clinician: Referring Hamlin Devine: Treating Gwyn Hieronymus/Extender: [name] in Treatment: 12 Active Problems Location of Pain Severity and Description of Pain Patient Has Paino No Site Locations Pain Management and Medication Current Pain Management: Electronic Signature(s) Signed:  08/15/2023 1:48:48 PM By: [name] Entered By: [name] on 08/15/2023 09:45:31 Becvar, [name] ([id number]) [id number] Page 4 of 5 -------------------------------------------------------------------------------- Patient/Caregiver Education Details Patient Name: Date of Service: [name], [name] [name] 10/30/2024andnbsp12:30 PM Medical Record Number: [id number] Patient Account Number: [id number] Date of Birth/Gender: Treating RN: 1933-03-01 (89 y.o. [name] Primary Care Physician: [name] Other Clinician: Referring Physician: Treating Physician/Extender: [name] in Treatment: 12 Education Assessment Education Provided To: Patient Education Topics Provided Wound/Skin Impairment: Methods: Explain/Verbal Responses: Reinforcements needed, State content correctly [employer]) Signed: 08/15/2023 4:22:52 PM By: Fonnie Mu RN Entered By: Fonnie Mu on 08/15/2023 09:50:24 -------------------------------------------------------------------------------- Wound Assessment Details Patient Name: Date of Service: [place], [name] 08/15/2023 12:30 PM Medical Record Number: [id number] Patient Account Number: [id number] Date of Birth/Sex: Treating RN: [birth year] (89 y.o. F) Primary Care Ishita Mcnerney: [name] Other Clinician: Referring Eulala Newcombe: Treating Ferrah Panagopoulos/Extender: [name] in Treatment: 12 Wound Status Wound Number: 10 Primary Etiology: Pressure Ulcer Wound Location: Right, Lateral Foot Wound Status: Open Wounding Event: Gradually Appeared Date Acquired: 06/19/2023 Weeks Of Treatment: 5 Clustered Wound: No Wound Measurements Length: (cm) 0.5 Width: (cm) 0.4 Depth: (cm) 0.2 Area: (cm) 0.157 Volume: (cm) 0.031 % Reduction in Area: 52.4% % Reduction in Volume: 53% Wound Description Classification: Category/Stage III Exudate Amount:  Medium Exudate Type: Serosanguineous Exudate Color: red, brown Periwound Skin Texture Texture Color No Abnormalities Noted: No No Abnormalities Noted: No Moisture [name], [name] ([id number]) [phone].pdf Page 5 of 5 No Abnormalities Noted: No Treatment Notes Wound #10 (Foot) Wound Laterality: Right, Lateral Cleanser Soap and Water Discharge Instruction: May shower and wash wound with dial antibacterial soap and water prior to dressing change. Peri-Wound Care Sween Lotion (Moisturizing lotion) Discharge Instruction: Apply moisturizing lotion as directed Topical Mupirocin Ointment Discharge Instruction: Apply Mupirocin (Bactroban) as instructed Primary Dressing Promogran Prisma Matrix, 4.34 (sq in) (silver collagen) Discharge Instruction: Moisten collagen with saline or hydrogel Secondary Dressing Zetuvit Plus Silicone Border Dressing 4x4 (in/in) Discharge Instruction: Apply silicone border over primary dressing as directed. Secured With Compression Wrap Compression Stockings Facilities manager) Signed: 08/15/2023 1:48:48 PM By: [name] Entered By: [name] on 08/15/2023 09:45:43 -------------------------------------------------------------------------------- Vitals Details Patient Name: Date of Service: [place], [name] 08/15/2023 12:30 PM Medical Record Number: [id number] Patient Account Number: [id number] Date of Birth/Sex: Treating RN: 1933/05/12 (89 y.o. F) Primary Care Om [place]: [name] Other Clinician: Referring Samson Ralph: Treating Renad Jenniges/Extender: [name] in Treatment: 12 Vital Signs Time Taken: 12:45 Temperature (F): 97.9 Height (in): 62 Pulse (bpm): 59 Weight (lbs): 105 Respiratory Rate (breaths/min): 18 Body Mass Index (BMI): 19.2  Blood Pressure (mmHg): 135/64 Reference Range: 80 - 120 mg / dl Electronic Signature(s) Signed: 08/15/2023 1:48:48 PM By: [name] Entered  By: [name] on 08/15/2023 09:45:25

## 2023-08-17 DIAGNOSIS — M069 Rheumatoid arthritis, unspecified: Secondary | ICD-10-CM | POA: Diagnosis not present

## 2023-08-17 DIAGNOSIS — Z79899 Other long term (current) drug therapy: Secondary | ICD-10-CM | POA: Diagnosis not present

## 2023-08-17 DIAGNOSIS — G894 Chronic pain syndrome: Secondary | ICD-10-CM | POA: Diagnosis not present

## 2023-08-22 ENCOUNTER — Encounter (HOSPITAL_BASED_OUTPATIENT_CLINIC_OR_DEPARTMENT_OTHER): Payer: PPO | Attending: Physician Assistant | Admitting: Internal Medicine

## 2023-08-22 DIAGNOSIS — H905 Unspecified sensorineural hearing loss: Secondary | ICD-10-CM | POA: Insufficient documentation

## 2023-08-22 DIAGNOSIS — I872 Venous insufficiency (chronic) (peripheral): Secondary | ICD-10-CM | POA: Insufficient documentation

## 2023-08-22 DIAGNOSIS — M19071 Primary osteoarthritis, right ankle and foot: Secondary | ICD-10-CM | POA: Insufficient documentation

## 2023-08-22 DIAGNOSIS — I10 Essential (primary) hypertension: Secondary | ICD-10-CM | POA: Diagnosis not present

## 2023-08-22 DIAGNOSIS — M069 Rheumatoid arthritis, unspecified: Secondary | ICD-10-CM | POA: Insufficient documentation

## 2023-08-22 DIAGNOSIS — L97822 Non-pressure chronic ulcer of other part of left lower leg with fat layer exposed: Secondary | ICD-10-CM | POA: Diagnosis not present

## 2023-08-22 DIAGNOSIS — L89893 Pressure ulcer of other site, stage 3: Secondary | ICD-10-CM | POA: Diagnosis not present

## 2023-08-23 NOTE — Progress Notes (Signed)
LYNESHA, BANGO ([id number]) [id number] Page 1 of 8 Visit Report for 08/22/2023 Arrival Information Details Patient Name: Date of Service: [place], [name] 08/22/2023 11:00 A M Medical Record Number: [id number] Patient Account Number: [id number] Date of Birth/Sex: Treating RN: February 03, 1933 (89 y.o. Debara Pickett, Millard.Loa Primary Care Anela Bensman: [name] Other Clinician: Referring Iktan Aikman: Treating Thierno Hun/Extender: Trevor Mace in Treatment: 13 Visit Information History Since Last Visit Added or deleted any medications: No Patient Arrived: Ambulatory Any new allergies or adverse reactions: No Arrival Time: 11:08 Had a fall or experienced change in No Accompanied By: self activities of daily living that may affect Transfer Assistance: None risk of falls: Patient Identification Verified: Yes Signs or symptoms of abuse/neglect since last visito No Secondary Verification Process Completed: Yes Hospitalized since last visit: No Patient Requires Transmission-Based Precautions: No Implantable device outside of the clinic excluding No Patient Has Alerts: No cellular tissue based products placed in the center since last visit: Has Dressing in Place as Prescribed: Yes Pain Present Now: No Electronic Signature(s) Signed: 08/22/2023 6:03:26 PM By: Shawn Stall RN, BSN Entered By: Shawn Stall on 08/22/2023 08:08:43 -------------------------------------------------------------------------------- Encounter Discharge Information Details Patient Name: Date of Service: [facility], [name] 08/22/2023 11:00 A M Medical Record Number: [id number] Patient Account Number: [id number] Date of Birth/Sex: Treating RN: 1933-03-15 (89 y.o. Arta Silence Primary Care Aurorah Schlachter: [name] Other Clinician: Referring Kameelah Minish: Treating Goodwin Kamphaus/Extender: Trevor Mace in Treatment: 13 Encounter Discharge Information Items  Post Procedure Vitals Discharge Condition: Stable Temperature (F): 97.9 Ambulatory Status: Ambulatory Pulse (bpm): 51 Discharge Destination: Home Respiratory Rate (breaths/min): 20 Transportation: Private Auto Blood Pressure (mmHg): 153/62 Accompanied By: self Schedule Follow-up Appointment: Yes Clinical Summary of Care: Electronic Signature(s) Signed: 08/22/2023 6:03:26 PM By: Shawn Stall RN, BSN Entered By: Shawn Stall on 08/22/2023 08:27:28 Jenna Luo ([id number]) [id number] Page 2 of 8 -------------------------------------------------------------------------------- Lower Extremity Assessment Details Patient Name: Date of Service: [place], [name] 08/22/2023 11:00 A M Medical Record Number: [id number] Patient Account Number: [id number] Date of Birth/Sex: Treating RN: 08-Mar-1933 (89 y.o. Debara Pickett, Millard.Loa Primary Care Ladaja Yusupov: [name] Other Clinician: Referring Shante Maysonet: Treating Kristian Hazzard/Extender: Trevor Mace in Treatment: 13 Edema Assessment Assessed: Kyra Searles: Yes] Franne Forts: Yes] Edema: [Left: No] [Right: No] Calf Left: Right: Point of Measurement: From Medial Instep 29 cm 28.7 cm Ankle Left: Right: Point of Measurement: From Medial Instep 16.5 cm 16 cm Vascular Assessment Pulses: Dorsalis Pedis Palpable: [Right:Yes] Extremity colors, hair growth, and conditions: Extremity Color: [Left:Hyperpigmented] [Right:Hyperpigmented] Hair Growth on Extremity: [Left:Yes] [Right:No] Temperature of Extremity: [Left:Warm] [Right:Warm] Capillary Refill: [Left:< 3 seconds] [Right:< 3 seconds] Dependent Rubor: [Left:Yes] [Right:No] Blanched when Elevated: [Left:No] [Right:No No] Toe Nail Assessment Left: Right: Thick: No Discolored: No Deformed: No Improper Length and Hygiene: No Electronic Signature(s) Signed: 08/22/2023 6:03:26 PM By: Shawn Stall RN, BSN Entered By: Shawn Stall on 08/22/2023  08:09:20 -------------------------------------------------------------------------------- Multi Wound Chart Details Patient Name: Date of Service: [place], [name] 08/22/2023 11:00 A M Medical Record Number: [id number] Patient Account Number: [id number] Date of Birth/Sex: Treating RN: Dec 23, 1932 (89 y.o. F) Primary Care Kamir Selover: [name] Other Clinician: Referring Calla Wedekind: Treating Marshella Tello/Extender: Trevor Mace in Treatment: 13 Vital Signs Height(in): 62 Pulse(bpm): 51 Weight(lbs): 105 Blood Pressure(mmHg): 153/62 DEVYNN, HESSLER ([id number]) [id number].pdf Page 3 of 8 Body Mass Index(BMI): 19.2 Temperature(F): 97.9 Respiratory Rate(breaths/min): 20 [10:Photos:] [N/A:N/A] Right, Lateral Foot N/A N/A Wound Location: Gradually  Appeared N/A N/A Wounding Event: Pressure Ulcer N/A N/A Primary Etiology: Cataracts, Hypertension, Raynauds, N/A N/A Comorbid History: Rheumatoid Arthritis, Osteoarthritis 06/19/2023 N/A N/A Date Acquired: 6 N/A N/A Weeks of Treatment: Open N/A N/A Wound Status: No N/A N/A Wound Recurrence: 0.5x0.4x0.2 N/A N/A Measurements L x W x D (cm) 0.157 N/A N/A A (cm) : rea 0.031 N/A N/A Volume (cm) : 52.40% N/A N/A % Reduction in A rea: 53.00% N/A N/A % Reduction in Volume: 12 Starting Position 1 (o'clock): 4 Ending Position 1 (o'clock): 0.2 Maximum Distance 1 (cm): Yes N/A N/A Undermining: Category/Stage III N/A N/A Classification: Medium N/A N/A Exudate A mount: Serosanguineous N/A N/A Exudate Type: red, brown N/A N/A Exudate Color: Distinct, outline attached N/A N/A Wound Margin: None Present (0%) N/A N/A Granulation A mount: Large (67-100%) N/A N/A Necrotic A mount: Fascia: No N/A N/A Exposed Structures: Fat Layer (Subcutaneous Tissue): No Tendon: No Muscle: No Joint: No Bone: No Small (1-33%) N/A N/A Epithelialization: Debridement - Selective/Open Wound N/A  N/A Debridement: Pre-procedure Verification/Time Out 11:20 N/A N/A Taken: Lidocaine 4% Topical Solution N/A N/A Pain Control: Slough N/A N/A Tissue Debrided: Non-Viable Tissue N/A N/A Level: 0.16 N/A N/A Debridement A (sq cm): rea Curette N/A N/A Instrument: Minimum N/A N/A Bleeding: Pressure N/A N/A Hemostasis A chieved: 0 N/A N/A Procedural Pain: 0 N/A N/A Post Procedural Pain: Procedure was tolerated well N/A N/A Debridement Treatment Response: 0.5x0.4x0.2 N/A N/A Post Debridement Measurements L x W x D (cm) 0.031 N/A N/A Post Debridement Volume: (cm) Category/Stage III N/A N/A Post Debridement Stage: Excoriation: No N/A N/A Periwound Skin Texture: Induration: No Callus: No Crepitus: No Rash: No Scarring: No Maceration: No N/A N/A Periwound Skin Moisture: Dry/Scaly: No Atrophie Blanche: No N/A N/A Periwound Skin Color: Cyanosis: No Ecchymosis: No Erythema: No Hemosiderin Staining: No Mottled: No Pallor: No Rubor: No Debridement N/A N/A Procedures Performed: Treatment Notes JUAQUINA, MACHNIK ([id number]) [phone].pdf Page 4 of 8 Wound #10 (Foot) Wound Laterality: Right, Lateral Cleanser Soap and Water Discharge Instruction: May shower and wash wound with dial antibacterial soap and water prior to dressing change. Peri-Wound Care Sween Lotion (Moisturizing lotion) Discharge Instruction: Apply moisturizing lotion as directed Topical Primary Dressing IODOFLEX 0.9% Cadexomer Iodine Pad 4x6 cm Discharge Instruction: Apply to wound bed as instructed Secondary Dressing Zetuvit Plus Silicone Border Dressing 4x4 (in/in) Discharge Instruction: Apply silicone border over primary dressing as directed. Secured With Compression Wrap Compression Stockings Facilities manager) Signed: 08/22/2023 4:54:50 PM By: Baltazar Najjar MD Entered By: Baltazar Najjar on 08/22/2023  09:31:02 -------------------------------------------------------------------------------- Multi-Disciplinary Care Plan Details Patient Name: Date of Service: [place], [name] 08/22/2023 11:00 A M Medical Record Number: [id number] Patient Account Number: [id number] Date of Birth/Sex: Treating RN: July 19, 1933 (89 y.o. Arta Silence Primary Care Sylvain Hasten: [name] Other Clinician: Referring Lakena Sparlin: Treating Loni Delbridge/Extender: Trevor Mace in Treatment: 13 Multidisciplinary Care Plan reviewed with physician Active Inactive Wound/Skin Impairment Nursing Diagnoses: Impaired tissue integrity Knowledge deficit related to ulceration/compromised skin integrity Goals: Patient/caregiver will verbalize understanding of skin care regimen Date Initiated: 05/23/2023 Target Resolution Date: 10/05/2023 Goal Status: Active Ulcer/skin breakdown will have a volume reduction of 30% by week 4 Date Initiated: 05/23/2023 Date Inactivated: 08/22/2023 Target Resolution Date: 08/20/2023 Unmet Reason: see wound Goal Status: Unmet measurement. Interventions: Assess patient/caregiver ability to obtain necessary supplies Assess patient/caregiver ability to perform ulcer/skin care regimen upon admission and as needed Assess ulceration(s) every visit Provide education on ulcer and skin care [place], Valicia L (  [id number]) [id number] Page 5 of 8 Treatment Activities: Skin care regimen initiated : 05/23/2023 Topical wound management initiated : 05/23/2023 Notes: Electronic Signature(s) Signed: 08/22/2023 6:03:26 PM By: Shawn Stall RN, BSN Entered By: Shawn Stall on 08/22/2023 08:21:30 -------------------------------------------------------------------------------- Pain Assessment Details Patient Name: Date of Service: [place], [name] 08/22/2023 11:00 A M Medical Record Number: [id number] Patient Account Number: [id number] Date of Birth/Sex: Treating  RN: 02/19/1933 (89 y.o. Arta Silence Primary Care Keylen Uzelac: [name] Other Clinician: Referring Damarko Stitely: Treating Ayasha Ellingsen/Extender: Trevor Mace in Treatment: 13 Active Problems Location of Pain Severity and Description of Pain Patient Has Paino No Site Locations Pain Management and Medication Current Pain Management: Electronic Signature(s) Signed: 08/22/2023 6:03:26 PM By: Shawn Stall RN, BSN Entered By: Shawn Stall on 08/22/2023 08:09:02 -------------------------------------------------------------------------------- Patient/Caregiver Education Details Patient Name: Date of Service: [place]. 11/6/2024andnbsp11:00 A M Medical Record Number: [id number] Patient Account Number: [id number] Date of Birth/Gender: Treating RN: 02-07-1933 (89 y.o. Arta Silence Primary Care Physician: [name] Other Clinician: Referring Physician: Treating Physician/Extender: Trevor Mace in Treatment: [address], [place] L ([id number]) [id number] Page 6 of 8 Education Assessment Education Provided To: Patient Education Topics Provided Wound/Skin Impairment: Handouts: Caring for Your Ulcer Methods: Explain/Verbal Responses: Reinforcements needed Electronic Signature(s) Signed: 08/22/2023 6:03:26 PM By: Shawn Stall RN, BSN Entered By: Shawn Stall on 08/22/2023 08:21:44 -------------------------------------------------------------------------------- Wound Assessment Details Patient Name: Date of Service: [place], [name] 08/22/2023 11:00 A M Medical Record Number: [id number] Patient Account Number: [id number] Date of Birth/Sex: Treating RN: [birth year] (89 y.o. Debara Pickett, Millard.Loa Primary Care Michaline Kindig: [name] Other Clinician: Referring Haadiya Frogge: Treating Lille Karim/Extender: Trevor Mace in Treatment: 13 Wound Status Wound Number: 10 Primary Pressure  Ulcer Etiology: Wound Location: Right, Lateral Foot Wound Status: Open Wounding Event: Gradually Appeared Comorbid Cataracts, Hypertension, Raynauds, Rheumatoid Arthritis, Date Acquired: 06/19/2023 History: Osteoarthritis Weeks Of Treatment: 6 Clustered Wound: No Photos Wound Measurements Length: (cm) 0.5 Width: (cm) 0.4 Depth: (cm) 0.2 Area: (cm) 0.157 Volume: (cm) 0.031 % Reduction in Area: 52.4% % Reduction in Volume: 53% Epithelialization: Small (1-33%) Tunneling: No Undermining: Yes Starting Position (o'clock): 12 Ending Position (o'clock): 4 Maximum Distance: (cm) 0.2 Wound Description Classification: Category/Stage III Wound Margin: Distinct, outline attached Exudate Amount: Medium Exudate Type: Serosanguineous Maxim, Tariah L ([id number]) Exudate Color: red, brown Foul Odor After Cleansing: No Slough/Fibrino Yes [id number] Page 7 of 8 Wound Bed Granulation Amount: None Present (0%) Exposed Structure Necrotic Amount: Large (67-100%) Fascia Exposed: No Necrotic Quality: Adherent Slough Fat Layer (Subcutaneous Tissue) Exposed: No Tendon Exposed: No Muscle Exposed: No Joint Exposed: No Bone Exposed: No Periwound Skin Texture Texture Color No Abnormalities Noted: No No Abnormalities Noted: No Callus: No Atrophie Blanche: No Crepitus: No Cyanosis: No Excoriation: No Ecchymosis: No Induration: No Erythema: No Rash: No Hemosiderin Staining: No Scarring: No Mottled: No Pallor: No Moisture Rubor: No No Abnormalities Noted: No Dry / Scaly: No Maceration: No Treatment Notes Wound #10 (Foot) Wound Laterality: Right, Lateral Cleanser Soap and Water Discharge Instruction: May shower and wash wound with dial antibacterial soap and water prior to dressing change. Peri-Wound Care Sween Lotion (Moisturizing lotion) Discharge Instruction: Apply moisturizing lotion as directed Topical Primary Dressing IODOFLEX 0.9% Cadexomer Iodine  Pad 4x6 cm Discharge Instruction: Apply to wound bed as instructed Secondary Dressing Zetuvit Plus Silicone Border Dressing 4x4 (in/in) Discharge Instruction: Apply silicone border over primary dressing as directed. Secured With Compression Wrap Compression  Stockings Facilities manager) Signed: 08/22/2023 6:03:26 PM By: Shawn Stall RN, BSN Entered By: Shawn Stall on 08/22/2023 08:23:14 -------------------------------------------------------------------------------- Vitals Details Patient Name: Date of Service: [place], [name] 08/22/2023 11:00 A M Medical Record Number: [id number] Patient Account Number: [id number] Date of Birth/Sex: Treating RN: [birth year] (89 y.o. Arta Silence Primary Care Tammie Yanda: [name] Other Clinician: Referring Westin Knotts: Treating Kannen Moxey/Extender: Trevor Mace in Treatment: [address], [place] L ([id number]) [id number] Page 8 of 8 Vital Signs Time Taken: 11:08 Temperature (F): 97.9 Height (in): 62 Pulse (bpm): 51 Weight (lbs): 105 Respiratory Rate (breaths/min): 20 Body Mass Index (BMI): 19.2 Blood Pressure (mmHg): 153/62 Reference Range: 80 - 120 mg / dl Electronic Signature(s) Signed: 08/22/2023 6:03:26 PM By: Shawn Stall RN, BSN Entered By: Shawn Stall on 08/22/2023 08:08:58

## 2023-08-23 NOTE — Progress Notes (Signed)
RUVI, FULLENWIDER ([id number]) [id number] Page 1 of 8 Visit Report for 08/22/2023 Debridement Details Patient Name: Date of Service: [place], [name] 08/22/2023 11:00 A M Medical Record Number: [id number] Patient Account Number: [id number] Date of Birth/Sex: Treating RN: 1932/12/17 (89 y.o. F) Primary Care Provider: [name] Other Clinician: Referring Provider: Treating Provider/Extender: Trevor Mace in Treatment: 13 Debridement Performed for Assessment: Wound #10 Right,Lateral Foot Performed By: Physician Maxwell Caul., MD The following information was scribed by: Shawn Stall The information was scribed for: [name] Debridement Type: Debridement Level of Consciousness (Pre-procedure): Awake and Alert Pre-procedure Verification/Time Out Yes - 11:20 Taken: Start Time: 11:21 Pain Control: Lidocaine 4% T opical Solution Percent of Wound Bed Debrided: 100% T Area Debrided (cm): otal 0.16 Tissue and other material debrided: Viable, Non-Viable, Slough, Slough Level: Non-Viable Tissue Debridement Description: Selective/Open Wound Instrument: Curette Bleeding: Minimum Hemostasis Achieved: Pressure End Time: 11:24 Procedural Pain: 0 Post Procedural Pain: 0 Response to Treatment: Procedure was tolerated well Level of Consciousness (Post- Awake and Alert procedure): Post Debridement Measurements of Total Wound Length: (cm) 0.5 Stage: Category/Stage III Width: (cm) 0.4 Depth: (cm) 0.2 Volume: (cm) 0.031 Character of Wound/Ulcer Post Debridement: Requires Further Debridement Post Procedure Diagnosis Same as Pre-procedure Electronic Signature(s) Signed: 08/22/2023 4:54:50 PM By: [name] Najjar MD Entered By: [name] on 08/22/2023 09:31:14 -------------------------------------------------------------------------------- HPI Details Patient Name: Date of Service: [name], [name] 08/22/2023 11:00 A  M Medical Record Number: [id number] Patient Account Number: [id number] Date of Birth/Sex: Treating RN: Apr 20, 1933 (89 y.o. F) Primary Care Provider: [name] Other Clinician: Referring Provider: Treating Provider/Extender: Trevor Mace in Treatment: [address], [place] ([id number]) [id number] Page 2 of 8 History of Present Illness HPI Description: READMISSION 07/15/2019 Patient is now an 89 year old woman who was previously here in 2016 and 2018. Cared for at the time by Dr. Meyer Russel. Both times with wounds related to trauma in the left leg. She was felt to have underlying venous insufficiency although both occasions were related to trauma. The patient tells me that 2 weeks ago she hit her lateral left leg on the car door. This was a skin tear with a flap for a period of time she has been using Polysporin. The flap came off recently she has a clean looking superficial wound on the left lateral leg. Our intake nurse reported some drainage. Past medical history; includes sensorineural hearing loss, osteoarthritis, hypertension and a hammertoe on the right foot for which she follows with podiatry. ABIs in our clinic were 1.19 on the left 07/21/2019; the patient did not like the wraps. They slid down and rub the wound the wound is measuring larger she is upset. 10/12; left lateral leg wound. Most of this looks healthy even under illumination. We have been using Hydrofera Blue under border foam. She would not allow compression 10/19; left lateral leg wound. 2 small open areas remain of the original wound. We have been using Hydrofera Blue under border foam. This seems to be making decent progress 10/26 left lateral leg traumatic wound. There is no open wound remaining. We have been using Hydrofera Blue under border foam she arrived with some denuded epithelium that I removed there is no open wound remaining. Is fairly clear she has some  degree of chronic venous insufficiency with not a lot of edema but dilated veins in her feet. She reminds me that she also has reactive arthritis and was on prednisone for a  prolonged period of time. Nevertheless I think it would be beneficial for her to at least wear support stockings but right now I do not think she is going to agree to the Readmission: 02/18/2020 upon evaluation today patient has sustained a skin tear which occurred about 1 week ago she tells me. Fortunately there does not appear to be any signs of active infection at this time which is excellent news. She has been tolerating the dressing changes without complication. Overall very pleased with where things stand at this point. The only issue that I see here is that the skin flap somewhat folded back and then reattached further down causing an area that is actually bunched up to form where it closed on itself but then reattached on the end of the tissue. Nonetheless I think we have to trim off the bunched up tissue in order to allow this to heal appropriately. That is the only issue I really see today. 03/10/2020 upon evaluation today patient actually appears to be doing excellent at this time. She has healed quite nicely and has been a couple of weeks since I saw her. Overall I feel like she is completely healed and ready for discharge as of today. Readmission: 03-21-2023 upon evaluation today patient appears to be doing somewhat poorly in regard to the left wound ulcer she tells me has been present for about a month. She tells me that she had this on the metal flatbed shopping carts at [facility] and subsequently has been having issues here with this since she tells me that the original Band-Aid she was using caused this to get worse. With that being said I do not see any evidence of active infection at this time everything seems to really be doing quite well as far as I am concerned. 03-28-2023 upon evaluation today patient appears to be  doing well currently in regard to her wounds which in fact appear to be completely healed. I am very pleased with this. Readmission: 05-23-2023 upon evaluation patient presents for reevaluation here in the clinic concerning issues that she has been having with her left anterior lower extremity. This actually appears to have a significant amount of new skin growth over top of the areas of irritation I am not exactly sure what happened here to be perfectly honest. I explained to the patient that this is kind of an unusual presentation for this to be this irritated and yet not have any significant openings at this time. I do not see any evidence of active infection locally or systemically which is great news. 05-30-2023 upon evaluation today patient appears to be doing well currently in regard to her lower extremity wounds which are actually drying out the may be a little bit too dry. I think PolyMem may be better for her and we discussed that today. Fortunately I do not see any signs of active infection locally or systemically which is great news. 06-06-2023 upon evaluation today patient appears to be doing well currently in regard to her wounds. Both are showing signs of improvement which is great news. Fortunately I do not see any signs of active infection locally or systemically at this time. 8/28; the distal wound on the left leg is healed she still has a comma shaped proximal wound medially. She reminds as she will be leaving for [place] on September night. The remaining wound is on the right upper medial calf a comma shaped wound this may be closed by that time. We had some discussion about  compression stockings 06-20-2023 upon evaluation today patient appears to be doing well currently in regard to her wounds in fact most everything appears to be about healed although she does have some dry skin over top of the distal left lateral leg. The proximal medial leg is very close to closure and seems to be  doing better. 07-11-2023 upon evaluation today patient appears to be doing well currently in regard to her wound. She has been tolerating the dressing changes without complication. Fortunately there does not appear to be any signs of infection locally or systemically which is great news. Unfortunately she has been having issues with colitis and she appears to be very pale today she also tells me that she has been feeling fairly poorly. Her rheumatologist to put her on prednisone she is also been put on antibiotics for the colitis. Her wounds on the left anterior lower extremity have reinitiated a little bit here and I am beginning to suspect this may be more autoimmune related. 10-to-24 upon evaluation today patient appears to be doing well currently at this point with regard to her wound. She has been tolerating the dressing changes without complication. Fortunately I do not see any signs of worsening overall I believe that the patient is making good headway towards complete closure which is great news. Still have a little concerned about the possibility of her having some issues here with a rheumatologic or dermatologic issue. I do believe that the steroid ointment has done a little bit better for her the triamcinolone I prescribed last week. 07-25-2023 upon evaluation today patient appears to be doing really about the same in regard to her wounds. The steroid ointment does not seem to make in a big difference unfortunately. Fortunately I do not see any signs of active infection locally or systemically which is good news. With that being said she does have 2 new pustules that have shown up she states they started looking like pimples initially and then have gotten worse since. With that being said I do believe that we need to see what we can do to try to get this under control she does have an appointment with dermatology next week in the meantime I am actually see about getting her started with a  antibiotic ointment instead of the steroid. 08-01-2023 upon evaluation today patient appears to be doing well currently in regard to her left leg which is actually showing signs of improvement the antibiotic ointment actually seems to be doing quite well. I am actually very pleased with where we stand currently. Fortunately I do not see any signs of active infection locally or systemically at this time. No fevers, chills, nausea, vomiting, or diarrhea. 08-08-2023 upon evaluation today patient appears to be doing actually pretty well in regard to her wound compared to where we were previous. I do think that the erythema has improved the wound size is getting a little bit smaller as I see her each week and in general I think that we are on the right track. There is no signs of significant infection although there is erythema I really feel like this may be more pressure related as it is blanchable as opposed to infection. Nonetheless I been leery to go forward with doing an oral antibiotic due to the history with the GI upset that she had more recently where she got very sick. REGINIA, BATTIE ([id number]) [id number] Page 3 of 8 Nonetheless if she is not significantly improved come next week we may need  to consider going forward with the oral antibiotics just to be on the safe side for now we will continue topical mupirocin. 08-15-2023 upon evaluation today patient appears to be doing well currently in regard to her wound. She is actually showing signs of good improvement she does have some slough and biofilm noted we will get a going perform some debridement to clear away the slough and biofilm down to good subcutaneous tissue my hope is this will help to get the area to heal much more effectively and quickly with the collagen. 11/6; patient has an open wound at the base of her fifth metatarsal on the right lateral foot. Small punched-out area with a nonviable surface some  erythema around the wound which almost looks like a chronic bursitis rather than infection. She has previously had wounds on the left leg which are apparently healed Electronic Signature(s) Signed: 08/22/2023 4:54:50 PM By: [name] Najjar MD Entered By: [name] on 08/22/2023 09:33:22 -------------------------------------------------------------------------------- Physical Exam Details Patient Name: Date of Service: [place], [name] 08/22/2023 11:00 A M Medical Record Number: [id number] Patient Account Number: [id number] Date of Birth/Sex: Treating RN: [birth year] (89 y.o. F) Primary Care Provider: [name] Other Clinician: Referring Provider: Treating Provider/Extender: Trevor Mace in Treatment: 13 Constitutional Patient is hypertensive.. Pulse regular and within target range for patient.Marland Kitchen Respirations regular, non-labored and within target range.. Temperature is normal and within the target range for the patient.Marland Kitchen Appears in no distress. Notes Wound exam; the area questions on the base of the right fifth met tarsal. Small punched-out area with some irritation I used a #3 curette to remove surface slough there is some undermining here but this does not probe to bone or deeper structures in the foot Electronic Signature(s) Signed: 08/22/2023 4:54:50 PM By: [name] Najjar MD Entered By: [name] on 08/22/2023 09:34:04 -------------------------------------------------------------------------------- Physician Orders Details Patient Name: Date of Service: [place], [name] 08/22/2023 11:00 A M Medical Record Number: [id number] Patient Account Number: [id number] Date of Birth/Sex: Treating RN: 05/12/1933 (89 y.o. Arta Silence Primary Care Provider: [name] Other Clinician: Referring Provider: Treating Provider/Extender: Trevor Mace in Treatment: 13 The following information was scribed by: Shawn Stall The information was scribed for: [name] Verbal / Phone Orders: No Diagnosis Coding Follow-up Appointments ppointment in 1 week. - with Dr. Leanord Hawking Wednesday 08/29/2023 2pm Return A ppointment in 2 weeks. - with Dr. Leanord Hawking Wednesday 09/05/2023 130pm Return A Anesthetic (In clinic) Topical Lidocaine 4% applied to wound bed Bathing/ Shower/ Hygiene May shower and wash wound with soap and water. CARMITA, BOOM ([id number]) [id number] Page 4 of 8 Edema Control - Orders / Instructions Elevate legs to the level of the heart or above for 30 minutes daily and/or when sitting for 3-4 times a day throughout the day. Avoid standing for long periods of time. Wound Treatment Wound #10 - Foot Wound Laterality: Right, Lateral Cleanser: Soap and Water Every Other Day/30 Days Discharge Instructions: May shower and wash wound with dial antibacterial soap and water prior to dressing change. Peri-Wound Care: Sween Lotion (Moisturizing lotion) Every Other Day/30 Days Discharge Instructions: Apply moisturizing lotion as directed Prim Dressing: IODOFLEX 0.9% Cadexomer Iodine Pad 4x6 cm Every Other Day/30 Days ary Discharge Instructions: Apply to wound bed as instructed Secondary Dressing: Zetuvit Plus Silicone Border Dressing 4x4 (in/in) Every Other Day/30 Days Discharge Instructions: Apply silicone border over primary dressing as directed. Electronic Signature(s) Signed: 08/22/2023 4:54:50 PM By:  [name] Najjar MD Signed: 08/22/2023 6:03:26 PM By: Shawn Stall RN, BSN Entered By: Shawn Stall on 08/22/2023 08:26:21 -------------------------------------------------------------------------------- Problem List Details Patient Name: Date of Service: [place], [name] 08/22/2023 11:00 A M Medical Record Number: [id number] Patient Account Number: [id number] Date of Birth/Sex: Treating RN: 1933-05-17 (89 y.o. Debara Pickett, [place].Loa Primary Care Provider: [name]  Other Clinician: Referring Provider: Treating Provider/Extender: Trevor Mace in Treatment: 13 Active Problems ICD-10 Encounter Code Description Active Date MDM Diagnosis I87.332 Chronic venous hypertension (idiopathic) with ulcer and inflammation of left 05/23/2023 No Yes lower extremity L97.822 Non-pressure chronic ulcer of other part of left lower leg with fat layer exposed8/04/2023 No Yes L89.893 Pressure ulcer of other site, stage 3 07/11/2023 No Yes L97.822 Non-pressure chronic ulcer of other part of left lower leg with fat layer exposed8/04/2023 No Yes I10 Essential (primary) hypertension 05/23/2023 No Yes Inactive Problems Resolved Problems Electronic Signature(s) CORDIA, MIKLOS ([id number]) [id number] Page 5 of 8 Signed: 08/22/2023 4:54:50 PM By: [name] Najjar MD Entered By: [name] on 08/22/2023 09:30:48 -------------------------------------------------------------------------------- Progress Note Details Patient Name: Date of Service: [place], [name] 08/22/2023 11:00 A M Medical Record Number: [id number] Patient Account Number: [id number] Date of Birth/Sex: Treating RN: Jan 19, 1933 (89 y.o. F) Primary Care Provider: [name] Other Clinician: Referring Provider: Treating Provider/Extender: Trevor Mace in Treatment: 13 Subjective History of Present Illness (HPI) READMISSION 07/15/2019 Patient is now an 89 year old woman who was previously here in 2016 and 2018. Cared for at the time by Dr. Meyer Russel. Both times with wounds related to trauma in the left leg. She was felt to have underlying venous insufficiency although both occasions were related to trauma. The patient tells me that 2 weeks ago she hit her lateral left leg on the car door. This was a skin tear with a flap for a period of time she has been using Polysporin. The flap came off recently she has a clean looking superficial  wound on the left lateral leg. Our intake nurse reported some drainage. Past medical history; includes sensorineural hearing loss, osteoarthritis, hypertension and a hammertoe on the right foot for which she follows with podiatry. ABIs in our clinic were 1.19 on the left 07/21/2019; the patient did not like the wraps. They slid down and rub the wound the wound is measuring larger she is upset. 10/12; left lateral leg wound. Most of this looks healthy even under illumination. We have been using Hydrofera Blue under border foam. She would not allow compression 10/19; left lateral leg wound. 2 small open areas remain of the original wound. We have been using Hydrofera Blue under border foam. This seems to be making decent progress 10/26 left lateral leg traumatic wound. There is no open wound remaining. We have been using Hydrofera Blue under border foam she arrived with some denuded epithelium that I removed there is no open wound remaining. Is fairly clear she has some degree of chronic venous insufficiency with not a lot of edema but dilated veins in her feet. She reminds me that she also has reactive arthritis and was on prednisone for a prolonged period of time. Nevertheless I think it would be beneficial for her to at least wear support stockings but right now I do not think she is going to agree to the Readmission: 02/18/2020 upon evaluation today patient has sustained a skin tear which occurred about 1 week ago she tells me. Fortunately there does not appear to  be any signs of active infection at this time which is excellent news. She has been tolerating the dressing changes without complication. Overall very pleased with where things stand at this point. The only issue that I see here is that the skin flap somewhat folded back and then reattached further down causing an area that is actually bunched up to form where it closed on itself but then reattached on the end of the tissue. Nonetheless I  think we have to trim off the bunched up tissue in order to allow this to heal appropriately. That is the only issue I really see today. 03/10/2020 upon evaluation today patient actually appears to be doing excellent at this time. She has healed quite nicely and has been a couple of weeks since I saw her. Overall I feel like she is completely healed and ready for discharge as of today. Readmission: 03-21-2023 upon evaluation today patient appears to be doing somewhat poorly in regard to the left wound ulcer she tells me has been present for about a month. She tells me that she had this on the metal flatbed shopping carts at [facility] and subsequently has been having issues here with this since she tells me that the original Band-Aid she was using caused this to get worse. With that being said I do not see any evidence of active infection at this time everything seems to really be doing quite well as far as I am concerned. 03-28-2023 upon evaluation today patient appears to be doing well currently in regard to her wounds which in fact appear to be completely healed. I am very pleased with this. Readmission: 05-23-2023 upon evaluation patient presents for reevaluation here in the clinic concerning issues that she has been having with her left anterior lower extremity. This actually appears to have a significant amount of new skin growth over top of the areas of irritation I am not exactly sure what happened here to be perfectly honest. I explained to the patient that this is kind of an unusual presentation for this to be this irritated and yet not have any significant openings at this time. I do not see any evidence of active infection locally or systemically which is great news. 05-30-2023 upon evaluation today patient appears to be doing well currently in regard to her lower extremity wounds which are actually drying out the may be a little bit too dry. I think PolyMem may be better for her and we discussed  that today. Fortunately I do not see any signs of active infection locally or systemically which is great news. 06-06-2023 upon evaluation today patient appears to be doing well currently in regard to her wounds. Both are showing signs of improvement which is great news. Fortunately I do not see any signs of active infection locally or systemically at this time. 8/28; the distal wound on the left leg is healed she still has a comma shaped proximal wound medially. She reminds as she will be leaving for [place] on September night. The remaining wound is on the right upper medial calf a comma shaped wound this may be closed by that time. We had some discussion about compression stockings 06-20-2023 upon evaluation today patient appears to be doing well currently in regard to her wounds in fact most everything appears to be about healed although she does have some dry skin over top of the distal left lateral leg. The proximal medial leg is very close to closure and seems to be doing better. Gl[place]ebrook,  [place] L ([id number]) [id number] Page 6 of 8 07-11-2023 upon evaluation today patient appears to be doing well currently in regard to her wound. She has been tolerating the dressing changes without complication. Fortunately there does not appear to be any signs of infection locally or systemically which is great news. Unfortunately she has been having issues with colitis and she appears to be very pale today she also tells me that she has been feeling fairly poorly. Her rheumatologist to put her on prednisone she is also been put on antibiotics for the colitis. Her wounds on the left anterior lower extremity have reinitiated a little bit here and I am beginning to suspect this may be more autoimmune related. 10-to-24 upon evaluation today patient appears to be doing well currently at this point with regard to her wound. She has been tolerating the dressing changes without complication.  Fortunately I do not see any signs of worsening overall I believe that the patient is making good headway towards complete closure which is great news. Still have a little concerned about the possibility of her having some issues here with a rheumatologic or dermatologic issue. I do believe that the steroid ointment has done a little bit better for her the triamcinolone I prescribed last week. 07-25-2023 upon evaluation today patient appears to be doing really about the same in regard to her wounds. The steroid ointment does not seem to make in a big difference unfortunately. Fortunately I do not see any signs of active infection locally or systemically which is good news. With that being said she does have 2 new pustules that have shown up she states they started looking like pimples initially and then have gotten worse since. With that being said I do believe that we need to see what we can do to try to get this under control she does have an appointment with dermatology next week in the meantime I am actually see about getting her started with a antibiotic ointment instead of the steroid. 08-01-2023 upon evaluation today patient appears to be doing well currently in regard to her left leg which is actually showing signs of improvement the antibiotic ointment actually seems to be doing quite well. I am actually very pleased with where we stand currently. Fortunately I do not see any signs of active infection locally or systemically at this time. No fevers, chills, nausea, vomiting, or diarrhea. 08-08-2023 upon evaluation today patient appears to be doing actually pretty well in regard to her wound compared to where we were previous. I do think that the erythema has improved the wound size is getting a little bit smaller as I see her each week and in general I think that we are on the right track. There is no signs of significant infection although there is erythema I really feel like this may be more  pressure related as it is blanchable as opposed to infection. Nonetheless I been leery to go forward with doing an oral antibiotic due to the history with the GI upset that she had more recently where she got very sick. Nonetheless if she is not significantly improved come next week we may need to consider going forward with the oral antibiotics just to be on the safe side for now we will continue topical mupirocin. 08-15-2023 upon evaluation today patient appears to be doing well currently in regard to her wound. She is actually showing signs of good improvement she does have some slough and biofilm noted we will get a  going perform some debridement to clear away the slough and biofilm down to good subcutaneous tissue my hope is this will help to get the area to heal much more effectively and quickly with the collagen. 11/6; patient has an open wound at the base of her fifth metatarsal on the right lateral foot. Small punched-out area with a nonviable surface some erythema around the wound which almost looks like a chronic bursitis rather than infection. She has previously had wounds on the left leg which are apparently healed Objective Constitutional Patient is hypertensive.. Pulse regular and within target range for patient.Marland Kitchen Respirations regular, non-labored and within target range.. Temperature is normal and within the target range for the patient.Marland Kitchen Appears in no distress. Vitals Time Taken: 11:08 AM, Height: 62 in, Weight: 105 lbs, BMI: 19.2, Temperature: 97.9 F, Pulse: 51 bpm, Respiratory Rate: 20 breaths/min, Blood Pressure: 153/62 mmHg. General Notes: Wound exam; the area questions on the base of the right fifth met tarsal. Small punched-out area with some irritation I used a #3 curette to remove surface slough there is some undermining here but this does not probe to bone or deeper structures in the foot Integumentary (Hair, Skin) Wound #10 status is Open. Original cause of wound was  Gradually Appeared. The date acquired was: 06/19/2023. The wound has been in treatment 6 weeks. The wound is located on the Right,Lateral Foot. The wound measures 0.5cm length x 0.4cm width x 0.2cm depth; 0.157cm^2 area and 0.031cm^3 volume. There is no tunneling noted, however, there is undermining starting at 12:00 and ending at 4:00 with a maximum distance of 0.2cm. There is a medium amount of serosanguineous drainage noted. The wound margin is distinct with the outline attached to the wound base. There is no granulation within the wound bed. There is a large (67-100%) amount of necrotic tissue within the wound bed including Adherent Slough. The periwound skin appearance did not exhibit: Callus, Crepitus, Excoriation, Induration, Rash, Scarring, Dry/Scaly, Maceration, Atrophie Blanche, Cyanosis, Ecchymosis, Hemosiderin Staining, Mottled, Pallor, Rubor, Erythema. Assessment Active Problems ICD-10 Chronic venous hypertension (idiopathic) with ulcer and inflammation of left lower extremity Non-pressure chronic ulcer of other part of left lower leg with fat layer exposed Pressure ulcer of other site, stage 3 Non-pressure chronic ulcer of other part of left lower leg with fat layer exposed Essential (primary) hypertension Procedures Wound #10 CHARLENA, HAUB ([id number]) [phone].pdf Page 7 of 8 Pre-procedure diagnosis of Wound #10 is a Pressure Ulcer located on the Right,Lateral Foot . There was a Selective/Open Wound Non-Viable Tissue Debridement with a total area of 0.16 sq cm performed by Maxwell Caul., MD. With the following instrument(s): Curette to remove Viable and Non-Viable tissue/material. Material removed includes [facility] after achieving pain control using Lidocaine 4% Topical Solution. A time out was conducted at 11:20, prior to the start of the procedure. A Minimum amount of bleeding was controlled with Pressure. The procedure was tolerated well with a pain  level of 0 throughout and a pain level of 0 following the procedure. Post Debridement Measurements: 0.5cm length x 0.4cm width x 0.2cm depth; 0.031cm^3 volume. Post debridement Stage noted as Category/Stage III. Character of Wound/Ulcer Post Debridement requires further debridement. Post procedure Diagnosis Wound #10: Same as Pre-Procedure Plan Follow-up Appointments: Return Appointment in 1 week. - with Dr. Leanord Hawking Wednesday 08/29/2023 2pm Return Appointment in 2 weeks. - with Dr. Leanord Hawking Wednesday 09/05/2023 130pm Anesthetic: (In clinic) Topical Lidocaine 4% applied to wound bed Bathing/ Shower/ Hygiene: May shower and wash wound with  soap and water. Edema Control - Orders / Instructions: Elevate legs to the level of the heart or above for 30 minutes daily and/or when sitting for 3-4 times a day throughout the day. Avoid standing for long periods of time. WOUND #10: - Foot Wound Laterality: Right, Lateral Cleanser: Soap and Water Every Other Day/30 Days Discharge Instructions: May shower and wash wound with dial antibacterial soap and water prior to dressing change. Peri-Wound Care: Sween Lotion (Moisturizing lotion) Every Other Day/30 Days Discharge Instructions: Apply moisturizing lotion as directed Prim Dressing: IODOFLEX 0.9% Cadexomer Iodine Pad 4x6 cm Every Other Day/30 Days ary Discharge Instructions: Apply to wound bed as instructed Secondary Dressing: Zetuvit Plus Silicone Border Dressing 4x4 (in/in) Every Other Day/30 Days Discharge Instructions: Apply silicone border over primary dressing as directed. 1. This looks like a chronic friction injury. 2. Area looks like chronic bursitis. 3. We used Iodoflex as the primary dressing with a border foam. The goal here will be to clean out the wound surface which was nonviable. 4. Her left leg healed. I am not sure when this wound opened up. Electronic Signature(s) Signed: 08/22/2023 4:54:50 PM By: [name] Najjar MD Entered By:  [name] on 08/22/2023 09:35:23 -------------------------------------------------------------------------------- SuperBill Details Patient Name: Date of Service: [place], [name] 08/22/2023 Medical Record Number: [id number] Patient Account Number: [id number] Date of Birth/Sex: Treating RN: 1933/03/31 (89 y.o. Debara Pickett, Millard.Loa Primary Care Provider: [name] Other Clinician: Referring Provider: Treating Provider/Extender: Trevor Mace in Treatment: 13 Diagnosis Coding ICD-10 Codes Code Description [phone] Chronic venous hypertension (idiopathic) with ulcer and inflammation of left lower extremity L97.822 Non-pressure chronic ulcer of other part of left lower leg with fat layer exposed L89.893 Pressure ulcer of other site, stage 3 L97.822 Non-pressure chronic ulcer of other part of left lower leg with fat layer exposed I10 Essential (primary) hypertension Facility Procedures : [place], [place] BJY7 Code: [id number] RY L ([id number]) IC L Description: 97597 - DEBRIDE WOUND 1ST 20 SQ CM OR < [id number]-10 Diagnosis Description 89.893 Pressure ulcer of other site, stage 3 Modifier: [phone].pdf Pag Quantity: 1 e 8 of 8 Physician Procedures : CPT4 Code Description Modifier [id number] 97597 - WC PHYS DEBR WO ANESTH 20 SQ CM ICD-10 Diagnosis Description L89.893 Pressure ulcer of other site, stage 3 Quantity: 1 Electronic Signature(s) Signed: 08/22/2023 4:54:50 PM By: [name] Najjar MD Entered By: [name] on 08/22/2023 09:36:23

## 2023-08-27 DIAGNOSIS — M0609 Rheumatoid arthritis without rheumatoid factor, multiple sites: Secondary | ICD-10-CM | POA: Diagnosis not present

## 2023-08-29 ENCOUNTER — Encounter (HOSPITAL_BASED_OUTPATIENT_CLINIC_OR_DEPARTMENT_OTHER): Payer: PPO | Admitting: Internal Medicine

## 2023-08-29 DIAGNOSIS — L97822 Non-pressure chronic ulcer of other part of left lower leg with fat layer exposed: Secondary | ICD-10-CM | POA: Diagnosis not present

## 2023-08-29 DIAGNOSIS — L89893 Pressure ulcer of other site, stage 3: Secondary | ICD-10-CM | POA: Diagnosis not present

## 2023-08-29 NOTE — Progress Notes (Signed)
[name], [name] ([id number]) [id number] Page 1 of 8 Visit Report for 08/29/2023 Arrival Information Details Patient Name: Date of Service: [name], [name] 08/29/2023 2:00 PM Medical Record Number: [id number] Patient Account Number: [id number] Date of Birth/Sex: Treating RN: 03/25/1933 (89 y.o. [name], [name] Primary Care Quamel Fitzmaurice: [name] Other Clinician: Referring Marqual Mi: Treating Maclovio Henson/Extender: Trevor Mace in Treatment: 14 Visit Information History Since Last Visit Added or deleted any medications: No Patient Arrived: Ambulatory Any new allergies or adverse reactions: No Arrival Time: 14:13 Had a fall or experienced change in No Accompanied By: self activities of daily living that may affect Transfer Assistance: None risk of falls: Patient Identification Verified: Yes Signs or symptoms of abuse/neglect since No Secondary Verification Process Completed: Yes last visito Patient Requires Transmission-Based Precautions: No Hospitalized since last visit: No Patient Has Alerts: No Implantable device outside of the clinic No excluding cellular tissue based products placed in the center since last visit: Has Dressing in Place as Prescribed: Yes Has Footwear/Offloading in Place as Yes Prescribed: Right: Surgical Shoe with Pressure Relief Insole Pain Present Now: No Notes per patient burning x2 days after debridement and dressing change from last Wednesday. Tyhir Schwan made aware. Electronic Signature(s) Signed: 08/29/2023 4:55:38 PM By: Shawn Stall RN, BSN Entered By: Shawn Stall on 08/29/2023 11:14:20 -------------------------------------------------------------------------------- Clinic Level of Care Assessment Details Patient Name: Date of Service: [name], [name] 08/29/2023 2:00 PM Medical Record Number: [id number] Patient Account Number: [id number] Date of Birth/Sex: Treating RN: 1932/12/24 (89 y.o. [name] Primary Care Berenise Hunton: [name] Other Clinician: Referring Shabrea Weldin: Treating Ryleigh Buenger/Extender: Trevor Mace in Treatment: 14 Clinic Level of Care Assessment Items TOOL 4 Quantity Score X- 1 0 Use when only an EandM is performed on FOLLOW-UP visit ASSESSMENTS - Nursing Assessment / Reassessment X- 1 10 Reassessment of Co-morbidities (includes updates in patient status) X- 1 5 Reassessment of Adherence to Treatment Plan ASSESSMENTS - Wound and Skin A ssessment / Reassessment X - Simple Wound Assessment / Reassessment - one wound 1 5 [name], [name] ([id number]) [phone].pdf Page 2 of 8 []  - 0 Complex Wound Assessment / Reassessment - multiple wounds []  - 0 Dermatologic / Skin Assessment (not related to wound area) ASSESSMENTS - Focused Assessment X- 1 5 Circumferential Edema Measurements - multi extremities []  - 0 Nutritional Assessment / Counseling / Intervention []  - 0 Lower Extremity Assessment (monofilament, tuning fork, pulses) []  - 0 Peripheral Arterial Disease Assessment (using hand held doppler) ASSESSMENTS - Ostomy and/or Continence Assessment and Care []  - 0 Incontinence Assessment and Management []  - 0 Ostomy Care Assessment and Management (repouching, etc.) PROCESS - Coordination of Care X - Simple Patient / Family Education for ongoing care 1 15 []  - 0 Complex (extensive) Patient / Family Education for ongoing care X- 1 10 Staff obtains Chiropractor, Records, T Results / Process Orders est []  - 0 Staff telephones HHA, Nursing Homes / Clarify orders / etc []  - 0 Routine Transfer to another Facility (non-emergent condition) []  - 0 Routine Hospital Admission (non-emergent condition) []  - 0 New Admissions / Manufacturing engineer / Ordering NPWT Apligraf, etc. , []  - 0 Emergency Hospital Admission (emergent condition) X- 1 10 Simple Discharge Coordination []  - 0 Complex (extensive)  Discharge Coordination PROCESS - Special Needs []  - 0 Pediatric / Minor Patient Management []  - 0 Isolation Patient Management []  - 0 Hearing / Language / Visual special needs []  - 0 Assessment of  Community assistance (transportation, D/C planning, etc.) []  - 0 Additional assistance / Altered mentation []  - 0 Support Surface(s) Assessment (bed, cushion, seat, etc.) INTERVENTIONS - Wound Cleansing / Measurement X - Simple Wound Cleansing - one wound 1 5 []  - 0 Complex Wound Cleansing - multiple wounds X- 1 5 Wound Imaging (photographs - any number of wounds) []  - 0 Wound Tracing (instead of photographs) X- 1 5 Simple Wound Measurement - one wound []  - 0 Complex Wound Measurement - multiple wounds INTERVENTIONS - Wound Dressings X - Small Wound Dressing one or multiple wounds 1 10 []  - 0 Medium Wound Dressing one or multiple wounds []  - 0 Large Wound Dressing one or multiple wounds []  - 0 Application of Medications - topical []  - 0 Application of Medications - injection INTERVENTIONS - Miscellaneous []  - 0 External ear exam []  - 0 Specimen Collection (cultures, biopsies, blood, body fluids, etc.) []  - 0 Specimen(s) / Culture(s) sent or taken to Lab for analysis [name], [name] ([id number]) [phone].pdf Page 3 of 8 []  - 0 Patient Transfer (multiple staff / Nurse, adult / Similar devices) []  - 0 Simple Staple / Suture removal (25 or less) []  - 0 Complex Staple / Suture removal (26 or more) []  - 0 Hypo / Hyperglycemic Management (close monitor of Blood Glucose) []  - 0 Ankle / Brachial Index (ABI) - do not check if billed separately X- 1 5 Vital Signs Has the patient been seen at the hospital within the last three years: Yes Total Score: 90 Level Of Care: New/Established - Level 3 Electronic Signature(s) Signed: 08/29/2023 4:55:38 PM By: Shawn Stall RN, BSN Entered By: Shawn Stall on 08/29/2023  11:43:08 -------------------------------------------------------------------------------- Encounter Discharge Information Details Patient Name: Date of Service: [name], [name] 08/29/2023 2:00 PM Medical Record Number: [id number] Patient Account Number: [id number] Date of Birth/Sex: Treating RN: [birth year] (89 y.o. [name] Primary Care Jennavie Martinek: [name] Other Clinician: Referring Viet Kemmerer: Treating Froilan Mclean/Extender: Trevor Mace in Treatment: 14 Encounter Discharge Information Items Discharge Condition: Stable Ambulatory Status: Ambulatory Discharge Destination: Home Transportation: Private Auto Accompanied By: self Schedule Follow-up Appointment: Yes Clinical Summary of Care: Electronic Signature(s) Signed: 08/29/2023 4:55:38 PM By: Shawn Stall RN, BSN Entered By: Shawn Stall on 08/29/2023 11:44:33 -------------------------------------------------------------------------------- Lower Extremity Assessment Details Patient Name: Date of Service: [place], [name] 08/29/2023 2:00 PM Medical Record Number: [id number] Patient Account Number: [id number] Date of Birth/Sex: Treating RN: 19-Feb-1933 (89 y.o. [name] Primary Care Lanier Millon: [name] Other Clinician: Referring Joneisha Miles: Treating Chia Rock/Extender: Trevor Mace in Treatment: 14 Edema Assessment Assessed: [name]: No] Franne Forts: Yes] Edema: [Left: No] [Right: No] Calf Left: Right: Point of Measurement: From Medial Instep 28 cm JANNELLY, LAFLAIR [name] ([id number]) [phone].pdf Page 4 of 8 Ankle Left: Right: Point of Measurement: From Medial Instep 16 cm Vascular Assessment Pulses: Dorsalis Pedis Palpable: [Right:Yes] Extremity colors, hair growth, and conditions: Extremity Color: [Left:Hyperpigmented] [Right:Hyperpigmented] Hair Growth on Extremity: [Left:Yes] [Right:No] Temperature of Extremity: [Left:Warm]  [Right:Cold] Capillary Refill: [Left:< 3 seconds] [Right:< 3 seconds] Dependent Rubor: [Left:Yes] [Right:No] Blanched when Elevated: [Right:No] Lipodermatosclerosis: [Left:No] [Right:No] Blood Pressure: Brachial: [Right:144] Ankle: [Right:Dorsalis Pedis: 120 0.83] Toe Nail Assessment Left: Right: Thick: No Discolored: No Deformed: No Improper Length and Hygiene: No Electronic Signature(s) Signed: 08/29/2023 4:55:38 PM By: Shawn Stall RN, BSN Entered By: Shawn Stall on 08/29/2023 11:22:19 -------------------------------------------------------------------------------- Multi-Disciplinary Care Plan Details Patient Name: Date of Service: [place], [name] 08/29/2023 2:00 PM Medical  Record Number: [id number] Patient Account Number: [id number] Date of Birth/Sex: Treating RN: 08-05-1933 (89 y.o. [name] Primary Care Milia Warth: [name] Other Clinician: Referring Pebbles Zeiders: Treating Zilphia Kozinski/Extender: Trevor Mace in Treatment: 14 Multidisciplinary Care Plan reviewed with physician Active Inactive Wound/Skin Impairment Nursing Diagnoses: Impaired tissue integrity Knowledge deficit related to ulceration/compromised skin integrity Goals: Patient/caregiver will verbalize understanding of skin care regimen Date Initiated: 05/23/2023 Target Resolution Date: 10/05/2023 Goal Status: Active Ulcer/skin breakdown will have a volume reduction of 30% by week 4 Date Initiated: 05/23/2023 Date Inactivated: 08/22/2023 Target Resolution Date: 08/20/2023 Unmet Reason: see wound Goal Status: Unmet measurement. Interventions: Assess patient/caregiver ability to obtain necessary supplies Assess patient/caregiver ability to perform ulcer/skin care regimen upon admission and as needed [name], [name] ([id number]) [phone].pdf Page 5 of 8 Assess ulceration(s) every visit Provide education on ulcer and skin care Treatment  Activities: Skin care regimen initiated : 05/23/2023 Topical wound management initiated : 05/23/2023 Notes: Electronic Signature(s) Signed: 08/29/2023 4:55:38 PM By: Shawn Stall RN, BSN Entered By: Shawn Stall on 08/29/2023 11:29:29 -------------------------------------------------------------------------------- Pain Assessment Details Patient Name: Date of Service: [place], [name] 08/29/2023 2:00 PM Medical Record Number: [id number] Patient Account Number: [id number] Date of Birth/Sex: Treating RN: [birth year] (89 y.o. [name] Primary Care Latania Bascomb: [name] Other Clinician: Referring Keta [place]: Treating Rilyn Scroggs/Extender: Trevor Mace in Treatment: 14 Active Problems Location of Pain Severity and Description of Pain Patient Has Paino No Site Locations Pain Management and Medication Current Pain Management: Notes per patient no pain now but first x2 days burning with pain from last Wednesday. Electronic Signature(s) Signed: 08/29/2023 4:55:38 PM By: Shawn Stall RN, BSN Entered By: Shawn Stall on 08/29/2023 11:15:34 Patient/Caregiver Education Details -------------------------------------------------------------------------------- [name] ([id number]) [id number] Page 6 of 8 Patient Name: Date of Service: [place], [name] 11/13/2024andnbsp2:00 PM Medical Record Number: [id number] Patient Account Number: [id number] Date of Birth/Gender: Treating RN: Jan 01, 1933 (89 y.o. [name] Primary Care Physician: [name] Other Clinician: Referring Physician: Treating Physician/Extender: Trevor Mace in Treatment: 14 Education Assessment Education Provided To: Patient Education Topics Provided Wound/Skin Impairment: Handouts: Caring for Your Ulcer Methods: Explain/Verbal Responses: Reinforcements needed Electronic Signature(s) Signed: 08/29/2023 4:55:38 PM By:  Shawn Stall RN, BSN Entered By: Shawn Stall on 08/29/2023 11:29:50 -------------------------------------------------------------------------------- Wound Assessment Details Patient Name: Date of Service: [place], [name] 08/29/2023 2:00 PM Medical Record Number: [id number] Patient Account Number: [id number] Date of Birth/Sex: Treating RN: 12-12-1932 (89 y.o. [name], [name] Primary Care Lex Linhares: [name] Other Clinician: Referring Dawnn Nam: Treating Diany Formosa/Extender: Trevor Mace in Treatment: 14 Wound Status Wound Number: 10 Primary Pressure Ulcer Etiology: Wound Location: Right, Lateral Foot Wound Status: Open Wounding Event: Gradually Appeared Comorbid Cataracts, Hypertension, Raynauds, Rheumatoid Arthritis, Date Acquired: 06/19/2023 History: Osteoarthritis Weeks Of Treatment: 7 Clustered Wound: No Photos Wound Measurements Length: (cm) 0.6 Width: (cm) 0.4 Depth: (cm) 0.3 Area: (cm) 0.188 Volume: (cm) 0.057 % Reduction in Area: 43% % Reduction in Volume: 13.6% Epithelialization: Small (1-33%) Tunneling: No Undermining: No Wound Description Classification: Category/Stage III Wound Margin: Distinct, outline attached [name], [name] ([id number]) Exudate Amount: Medium Exudate Type: Serosanguineous Exudate Color: red, brown Foul Odor After Cleansing: No Slough/Fibrino Yes [id number].pdf Page 7 of 8 Wound Bed Granulation Amount: Medium (34-66%) Exposed Structure Granulation Quality: Pink, Pale Fascia Exposed: No Necrotic Amount: Medium (34-66%) Fat Layer (Subcutaneous Tissue) Exposed: Yes Necrotic Quality: Adherent Slough Tendon Exposed: No  Muscle Exposed: No Joint Exposed: No Bone Exposed: No Periwound Skin Texture Texture Color No Abnormalities Noted: No No Abnormalities Noted: No Callus: No Atrophie Blanche: No Crepitus: No Cyanosis: No Excoriation: No Ecchymosis: No Induration:  No Erythema: No Rash: No Hemosiderin Staining: No Scarring: No Mottled: No Pallor: No Moisture Rubor: No No Abnormalities Noted: No Dry / Scaly: Yes Maceration: No Treatment Notes Wound #10 (Foot) Wound Laterality: Right, Lateral Cleanser Soap and Water Discharge Instruction: May shower and wash wound with dial antibacterial soap and water prior to dressing change. Peri-Wound Care Sween Lotion (Moisturizing lotion) Discharge Instruction: Apply moisturizing lotion as directed Topical Primary Dressing IODOFLEX 0.9% Cadexomer Iodine Pad 4x6 cm Discharge Instruction: Apply to wound bed as instructed Secondary Dressing Optifoam Non-Adhesive Dressing, 4x4 in Discharge Instruction: Apply over primary dressing x1 layer foam donut. Woven Gauze Sponges 2x2 in Discharge Instruction: Apply over primary dressing as directed. Zetuvit Plus Silicone Border Dressing 4x4 (in/in) Discharge Instruction: Apply silicone border over primary dressing as directed. Secured With Compression Wrap Compression Stockings Facilities manager) Signed: 08/29/2023 4:55:38 PM By: Shawn Stall RN, BSN Entered By: Shawn Stall on 08/29/2023 11:24:52 Vitals Details -------------------------------------------------------------------------------- [name] ([id number]) [id number] Page 8 of 8 Patient Name: Date of Service: [place], [name] 08/29/2023 2:00 PM Medical Record Number: [id number] Patient Account Number: [id number] Date of Birth/Sex: Treating RN: 01-Jan-1933 (89 y.o. [name], [name] Primary Care Rodney Yera: [name] Other Clinician: Referring Deon Ivey: Treating Zully Frane/Extender: Trevor Mace in Treatment: 14 Vital Signs Time Taken: 14:10 Temperature (F): 98.2 Height (in): 62 Pulse (bpm): 71 Weight (lbs): 105 Respiratory Rate (breaths/min): 16 Body Mass Index (BMI): 19.2 Blood Pressure (mmHg): 144/82 Reference  Range: 80 - 120 mg / dl Electronic Signature(s) Signed: 08/29/2023 4:55:38 PM By: Shawn Stall RN, BSN Entered By: Shawn Stall on 08/29/2023 11:14:31

## 2023-08-30 NOTE — Progress Notes (Signed)
[name], [name] ([id number]) [id number] Page 1 of 7 Visit Report for 08/29/2023 HPI Details Patient Name: Date of Service: [place], [name] 08/29/2023 2:00 PM Medical Record Number: [id number] Patient Account Number: [id number] Date of Birth/Sex: Treating RN: May 04, 1933 (89 y.o. F) Primary Care Provider: [name] Other Clinician: Referring Provider: Treating Provider/Extender: Trevor Mace in Treatment: 14 History of Present Illness HPI Description: READMISSION 07/15/2019 Patient is now an 89 year old woman who was previously here in 2016 and 2018. Cared for at the time by Dr. Meyer Russel. Both times with wounds related to trauma in the left leg. She was felt to have underlying venous insufficiency although both occasions were related to trauma. The patient tells me that 2 weeks ago she hit her lateral left leg on the car door. This was a skin tear with a flap for a period of time she has been using Polysporin. The flap came off recently she has a clean looking superficial wound on the left lateral leg. Our intake nurse reported some drainage. Past medical history; includes sensorineural hearing loss, osteoarthritis, hypertension and a hammertoe on the right foot for which she follows with podiatry. ABIs in our clinic were 1.19 on the left 07/21/2019; the patient did not like the wraps. They slid down and rub the wound the wound is measuring larger she is upset. 10/12; left lateral leg wound. Most of this looks healthy even under illumination. We have been using Hydrofera Blue under border foam. She would not allow compression 10/19; left lateral leg wound. 2 small open areas remain of the original wound. We have been using Hydrofera Blue under border foam. This seems to be making decent progress 10/26 left lateral leg traumatic wound. There is no open wound remaining. We have been using Hydrofera Blue under border foam she arrived with  some denuded epithelium that I removed there is no open wound remaining. Is fairly clear she has some degree of chronic venous insufficiency with not a lot of edema but dilated veins in her feet. She reminds me that she also has reactive arthritis and was on prednisone for a prolonged period of time. Nevertheless I think it would be beneficial for her to at least wear support stockings but right now I do not think she is going to agree to the Readmission: 02/18/2020 upon evaluation today patient has sustained a skin tear which occurred about 1 week ago she tells me. Fortunately there does not appear to be any signs of active infection at this time which is excellent news. She has been tolerating the dressing changes without complication. Overall very pleased with where things stand at this point. The only issue that I see here is that the skin flap somewhat folded back and then reattached further down causing an area that is actually bunched up to form where it closed on itself but then reattached on the end of the tissue. Nonetheless I think we have to trim off the bunched up tissue in order to allow this to heal appropriately. That is the only issue I really see today. 03/10/2020 upon evaluation today patient actually appears to be doing excellent at this time. She has healed quite nicely and has been a couple of weeks since I saw her. Overall I feel like she is completely healed and ready for discharge as of today. Readmission: 03-21-2023 upon evaluation today patient appears to be doing somewhat poorly in regard to the left wound ulcer she tells me has been present  for about a month. She tells me that she had this on the metal flatbed shopping carts at [facility] and subsequently has been having issues here with this since she tells me that the original Band-Aid she was using caused this to get worse. With that being said I do not see any evidence of active infection at this time everything seems to  really be doing quite well as far as I am concerned. 03-28-2023 upon evaluation today patient appears to be doing well currently in regard to her wounds which in fact appear to be completely healed. I am very pleased with this. Readmission: 05-23-2023 upon evaluation patient presents for reevaluation here in the clinic concerning issues that she has been having with her left anterior lower extremity. This actually appears to have a significant amount of new skin growth over top of the areas of irritation I am not exactly sure what happened here to be perfectly honest. I explained to the patient that this is kind of an unusual presentation for this to be this irritated and yet not have any significant openings at this time. I do not see any evidence of active infection locally or systemically which is great news. 05-30-2023 upon evaluation today patient appears to be doing well currently in regard to her lower extremity wounds which are actually drying out the may be a little bit too dry. I think PolyMem may be better for her and we discussed that today. Fortunately I do not see any signs of active infection locally or systemically which is great news. 06-06-2023 upon evaluation today patient appears to be doing well currently in regard to her wounds. Both are showing signs of improvement which is great news. Fortunately I do not see any signs of active infection locally or systemically at this time. 8/28; the distal wound on the left leg is healed she still has a comma shaped proximal wound medially. She reminds as she will be leaving for [place] on September night. The remaining wound is on the right upper medial calf a comma shaped wound this may be closed by that time. We had some discussion about compression stockings 06-20-2023 upon evaluation today patient appears to be doing well currently in regard to her wounds in fact most everything appears to be about healed although she does have some dry skin  over top of the distal left lateral leg. The proximal medial leg is very close to closure and seems to be doing better. 07-11-2023 upon evaluation today patient appears to be doing well currently in regard to her wound. She has been tolerating the dressing changes without HEYAM, PRESHA ([id number]) [id number] Page 2 of 7 complication. Fortunately there does not appear to be any signs of infection locally or systemically which is great news. Unfortunately she has been having issues with colitis and she appears to be very pale today she also tells me that she has been feeling fairly poorly. Her rheumatologist to put her on prednisone she is also been put on antibiotics for the colitis. Her wounds on the left anterior lower extremity have reinitiated a little bit here and I am beginning to suspect this may be more autoimmune related. 10-to-24 upon evaluation today patient appears to be doing well currently at this point with regard to her wound. She has been tolerating the dressing changes without complication. Fortunately I do not see any signs of worsening overall I believe that the patient is making good headway towards complete closure which is great news.  Still have a little concerned about the possibility of her having some issues here with a rheumatologic or dermatologic issue. I do believe that the steroid ointment has done a little bit better for her the triamcinolone I prescribed last week. 07-25-2023 upon evaluation today patient appears to be doing really about the same in regard to her wounds. The steroid ointment does not seem to make in a big difference unfortunately. Fortunately I do not see any signs of active infection locally or systemically which is good news. With that being said she does have 2 new pustules that have shown up she states they started looking like pimples initially and then have gotten worse since. With that being said I do believe that we  need to see what we can do to try to get this under control she does have an appointment with dermatology next week in the meantime I am actually see about getting her started with a antibiotic ointment instead of the steroid. 08-01-2023 upon evaluation today patient appears to be doing well currently in regard to her left leg which is actually showing signs of improvement the antibiotic ointment actually seems to be doing quite well. I am actually very pleased with where we stand currently. Fortunately I do not see any signs of active infection locally or systemically at this time. No fevers, chills, nausea, vomiting, or diarrhea. 08-08-2023 upon evaluation today patient appears to be doing actually pretty well in regard to her wound compared to where we were previous. I do think that the erythema has improved the wound size is getting a little bit smaller as I see her each week and in general I think that we are on the right track. There is no signs of significant infection although there is erythema I really feel like this may be more pressure related as it is blanchable as opposed to infection. Nonetheless I been leery to go forward with doing an oral antibiotic due to the history with the GI upset that she had more recently where she got very sick. Nonetheless if she is not significantly improved come next week we may need to consider going forward with the oral antibiotics just to be on the safe side for now we will continue topical mupirocin. 08-15-2023 upon evaluation today patient appears to be doing well currently in regard to her wound. She is actually showing signs of good improvement she does have some slough and biofilm noted we will get a going perform some debridement to clear away the slough and biofilm down to good subcutaneous tissue my hope is this will help to get the area to heal much more effectively and quickly with the collagen. 11/6; patient has an open wound at the base of  her fifth metatarsal on the right lateral foot. Small punched-out area with a nonviable surface some erythema around the wound which almost looks like a chronic bursitis rather than infection. She has previously had wounds on the left leg which are apparently healed 11/13; a wound at the base of her fifth metatarsal on the right lateral foot. This may actually represent a bunion deformity. Last week she had a punched-out area with nonviable surface I removed with a curette. We have been using Iodoflex to clean up the wound bed. She said this hurt for 2 or 3 days but it is settled down now. She is changing the dressing herself. Electronic Signature(s) Signed: 08/30/2023 4:06:32 PM By: Baltazar Najjar MD Entered By: Baltazar Najjar on 08/29/2023 12:13:20 --------------------------------------------------------------------------------  Physical Exam Details Patient Name: Date of Service: [name] 08/29/2023 2:00 PM Medical Record Number: [id number] Patient Account Number: [id number] Date of Birth/Sex: Treating RN: 1933/03/31 (89 y.o. F) Primary Care Provider: [name] Other Clinician: Referring Provider: Treating Provider/Extender: Trevor Mace in Treatment: 14 Constitutional Sitting or standing Blood Pressure is within target range for patient.. Pulse regular and within target range for patient.Marland Kitchen Respirations regular, non-labored and within target range.. Temperature is normal and within the target range for the patient.Marland Kitchen Appears in no distress. Notes Wound exam; the area questions at the base of the right fifth metatarsal. I suspect there is a chronic friction related injury here. The base of this wound looked a lot better than last week. No mechanical debridement is necessary. Electronic Signature(s) Signed: 08/30/2023 4:06:32 PM By: Baltazar Najjar MD Entered By: Baltazar Najjar on 08/29/2023 12:15:18 Physician Orders  Details -------------------------------------------------------------------------------- Jenna Luo ([id number]) [id number] Page 3 of 7 Patient Name: Date of Service: [place], [name] 08/29/2023 2:00 PM Medical Record Number: [id number] Patient Account Number: [id number] Date of Birth/Sex: Treating RN: October 02, 1933 (89 y.o. Debara Pickett, [place] Primary Care Provider: [name] Other Clinician: Referring Provider: Treating Provider/Extender: Trevor Mace in Treatment: 14 The following information was scribed by: Shawn Stall The information was scribed for: Baltazar Najjar Verbal / Phone Orders: No Diagnosis Coding ICD-10 Coding Code Description I87.332 Chronic venous hypertension (idiopathic) with ulcer and inflammation of left lower extremity L97.822 Non-pressure chronic ulcer of other part of left lower leg with fat layer exposed L89.893 Pressure ulcer of other site, stage 3 L97.822 Non-pressure chronic ulcer of other part of left lower leg with fat layer exposed I10 Essential (primary) hypertension Follow-up Appointments ppointment in 1 week. - with Dr. Leanord Hawking Wednesday 09/05/2023 130pm (already scheduled) Return A Return appointment in 3 weeks. - Wednesday Dr. Leanord Hawking (Front office to schedule) Nurse Visit: - 09/12/2023 nurse visit Wednesday (already scheduled) Anesthetic (In clinic) Topical Lidocaine 4% applied to wound bed Cellular or Tissue Based Products Other Cellular or Tissue Based Products Orders/Instructions: - insurance auth for [place] from [employer] May shower and wash wound with soap and water. Edema Control - Orders / Instructions Elevate legs to the level of the heart or above for 30 minutes daily and/or when sitting for 3-4 times a day throughout the day. Avoid standing for long periods of time. Wound Treatment Wound #10 - Foot Wound Laterality: Right, Lateral Cleanser:  Soap and Water Every Other Day/30 Days Discharge Instructions: May shower and wash wound with dial antibacterial soap and water prior to dressing change. Peri-Wound Care: Sween Lotion (Moisturizing lotion) Every Other Day/30 Days Discharge Instructions: Apply moisturizing lotion as directed Prim Dressing: IODOFLEX 0.9% Cadexomer Iodine Pad 4x6 cm Every Other Day/30 Days ary Discharge Instructions: Apply to wound bed as instructed Secondary Dressing: Optifoam Non-Adhesive Dressing, 4x4 in Every Other Day/30 Days Discharge Instructions: Apply over primary dressing x1 layer foam donut. Secondary Dressing: Woven Gauze Sponges 2x2 in Every Other Day/30 Days Discharge Instructions: Apply over primary dressing as directed. Secondary Dressing: Zetuvit Plus Silicone Border Dressing 4x4 (in/in) Every Other Day/30 Days Discharge Instructions: Apply silicone border over primary dressing as directed. Electronic Signature(s) Signed: 08/29/2023 4:55:38 PM By: Shawn Stall RN, BSN Signed: 08/30/2023 4:06:32 PM By: Baltazar Najjar MD Entered By: Shawn Stall on 08/29/2023 11:44:08 Problem List Details -------------------------------------------------------------------------------- Jenna Luo ([id number]) [id number] Page 4 of 7 Patient Name: Date of Service:  [place], [place] R[place] L. 08/29/2023 2:00 PM Medical Record Number: [id number] Patient Account Number: [id number] Date of Birth/Sex: Treating RN: May 16, 1933 (89 y.o. Arta Silence Primary Care Provider: [name] Other Clinician: Referring Provider: Treating Provider/Extender: Trevor Mace in Treatment: 14 Active Problems ICD-10 Encounter Code Description Active Date MDM Diagnosis L89.893 Pressure ulcer of other site, stage 3 07/11/2023 No Yes L97.822 Non-pressure chronic ulcer of other part of left lower leg with fat layer exposed8/04/2023 No Yes I10 Essential (primary) hypertension  05/23/2023 No Yes Inactive Problems ICD-10 Code Description Active Date Inactive Date L97.822 Non-pressure chronic ulcer of other part of left lower leg with fat layer exposed 05/23/2023 05/23/2023 I87.332 Chronic venous hypertension (idiopathic) with ulcer and inflammation of left lower 05/23/2023 05/23/2023 extremity Resolved Problems Electronic Signature(s) Signed: 08/30/2023 4:06:32 PM By: Baltazar Najjar MD Entered By: Baltazar Najjar on 08/29/2023 12:12:21 -------------------------------------------------------------------------------- Progress Note Details Patient Name: Date of Service: [place], [name] 08/29/2023 2:00 PM Medical Record Number: [id number] Patient Account Number: [id number] Date of Birth/Sex: Treating RN: [birth year] (89 y.o. F) Primary Care Provider: [name] Other Clinician: Referring Provider: Treating Provider/Extender: Trevor Mace in Treatment: 14 Subjective History of Present Illness (HPI) READMISSION 07/15/2019 Patient is now an 88 year old woman who was previously here in 2016 and 2018. Cared for at the time by Dr. Meyer Russel. Both times with wounds related to trauma in the left leg. She was felt to have underlying venous insufficiency although both occasions were related to trauma. The patient tells me that 2 weeks ago she hit her lateral left leg on the car door. This was a skin tear with a flap for a period of time she has been using Polysporin. The flap came off recently she has a clean looking superficial wound on the left lateral leg. Our intake nurse reported some drainage. Past medical history; includes sensorineural hearing loss, osteoarthritis, hypertension and a hammertoe on the right foot for which she follows with podiatry. ABIs in our clinic were 1.19 on the left 07/21/2019; the patient did not like the wraps. They slid down and rub the wound the wound is measuring larger she is upset. RAELLE, LEDDON ([id number])  [id number] Page 5 of 7 10/12; left lateral leg wound. Most of this looks healthy even under illumination. We have been using Hydrofera Blue under border foam. She would not allow compression 10/19; left lateral leg wound. 2 small open areas remain of the original wound. We have been using Hydrofera Blue under border foam. This seems to be making decent progress 10/26 left lateral leg traumatic wound. There is no open wound remaining. We have been using Hydrofera Blue under border foam she arrived with some denuded epithelium that I removed there is no open wound remaining. Is fairly clear she has some degree of chronic venous insufficiency with not a lot of edema but dilated veins in her feet. She reminds me that she also has reactive arthritis and was on prednisone for a prolonged period of time. Nevertheless I think it would be beneficial for her to at least wear support stockings but right now I do not think she is going to agree to the Readmission: 02/18/2020 upon evaluation today patient has sustained a skin tear which occurred about 1 week ago she tells me. Fortunately there does not appear to be any signs of active infection at this time which is excellent news. She has been tolerating the dressing changes without complication. Overall very  pleased with where things stand at this point. The only issue that I see here is that the skin flap somewhat folded back and then reattached further down causing an area that is actually bunched up to form where it closed on itself but then reattached on the end of the tissue. Nonetheless I think we have to trim off the bunched up tissue in order to allow this to heal appropriately. That is the only issue I really see today. 03/10/2020 upon evaluation today patient actually appears to be doing excellent at this time. She has healed quite nicely and has been a couple of weeks since I saw her. Overall I feel like she is completely  healed and ready for discharge as of today. Readmission: 03-21-2023 upon evaluation today patient appears to be doing somewhat poorly in regard to the left wound ulcer she tells me has been present for about a month. She tells me that she had this on the metal flatbed shopping carts at [facility] and subsequently has been having issues here with this since she tells me that the original Band-Aid she was using caused this to get worse. With that being said I do not see any evidence of active infection at this time everything seems to really be doing quite well as far as I am concerned. 03-28-2023 upon evaluation today patient appears to be doing well currently in regard to her wounds which in fact appear to be completely healed. I am very pleased with this. Readmission: 05-23-2023 upon evaluation patient presents for reevaluation here in the clinic concerning issues that she has been having with her left anterior lower extremity. This actually appears to have a significant amount of new skin growth over top of the areas of irritation I am not exactly sure what happened here to be perfectly honest. I explained to the patient that this is kind of an unusual presentation for this to be this irritated and yet not have any significant openings at this time. I do not see any evidence of active infection locally or systemically which is great news. 05-30-2023 upon evaluation today patient appears to be doing well currently in regard to her lower extremity wounds which are actually drying out the may be a little bit too dry. I think PolyMem may be better for her and we discussed that today. Fortunately I do not see any signs of active infection locally or systemically which is great news. 06-06-2023 upon evaluation today patient appears to be doing well currently in regard to her wounds. Both are showing signs of improvement which is great news. Fortunately I do not see any signs of active infection locally or  systemically at this time. 8/28; the distal wound on the left leg is healed she still has a comma shaped proximal wound medially. She reminds as she will be leaving for [place] on September night. The remaining wound is on the right upper medial calf a comma shaped wound this may be closed by that time. We had some discussion about compression stockings 06-20-2023 upon evaluation today patient appears to be doing well currently in regard to her wounds in fact most everything appears to be about healed although she does have some dry skin over top of the distal left lateral leg. The proximal medial leg is very close to closure and seems to be doing better. 07-11-2023 upon evaluation today patient appears to be doing well currently in regard to her wound. She has been tolerating the dressing changes without complication.  Fortunately there does not appear to be any signs of infection locally or systemically which is great news. Unfortunately she has been having issues with colitis and she appears to be very pale today she also tells me that she has been feeling fairly poorly. Her rheumatologist to put her on prednisone she is also been put on antibiotics for the colitis. Her wounds on the left anterior lower extremity have reinitiated a little bit here and I am beginning to suspect this may be more autoimmune related. 10-to-24 upon evaluation today patient appears to be doing well currently at this point with regard to her wound. She has been tolerating the dressing changes without complication. Fortunately I do not see any signs of worsening overall I believe that the patient is making good headway towards complete closure which is great news. Still have a little concerned about the possibility of her having some issues here with a rheumatologic or dermatologic issue. I do believe that the steroid ointment has done a little bit better for her the triamcinolone I prescribed last week. 07-25-2023 upon  evaluation today patient appears to be doing really about the same in regard to her wounds. The steroid ointment does not seem to make in a big difference unfortunately. Fortunately I do not see any signs of active infection locally or systemically which is good news. With that being said she does have 2 new pustules that have shown up she states they started looking like pimples initially and then have gotten worse since. With that being said I do believe that we need to see what we can do to try to get this under control she does have an appointment with dermatology next week in the meantime I am actually see about getting her started with a antibiotic ointment instead of the steroid. 08-01-2023 upon evaluation today patient appears to be doing well currently in regard to her left leg which is actually showing signs of improvement the antibiotic ointment actually seems to be doing quite well. I am actually very pleased with where we stand currently. Fortunately I do not see any signs of active infection locally or systemically at this time. No fevers, chills, nausea, vomiting, or diarrhea. 08-08-2023 upon evaluation today patient appears to be doing actually pretty well in regard to her wound compared to where we were previous. I do think that the erythema has improved the wound size is getting a little bit smaller as I see her each week and in general I think that we are on the right track. There is no signs of significant infection although there is erythema I really feel like this may be more pressure related as it is blanchable as opposed to infection. Nonetheless I been leery to go forward with doing an oral antibiotic due to the history with the GI upset that she had more recently where she got very sick. Nonetheless if she is not significantly improved come next week we may need to consider going forward with the oral antibiotics just to be on the safe side for now we will continue topical  mupirocin. 08-15-2023 upon evaluation today patient appears to be doing well currently in regard to her wound. She is actually showing signs of good improvement she does have some slough and biofilm noted we will get a going perform some debridement to clear away the slough and biofilm down to good subcutaneous tissue my hope is this will help to get the area to heal much more effectively and quickly  with the collagen. 11/6; patient has an open wound at the base of her fifth metatarsal on the right lateral foot. Small punched-out area with a nonviable surface some erythema around the wound which almost looks like a chronic bursitis rather than infection. She has previously had wounds on the left leg which are apparently healed 11/13; a wound at the base of her fifth metatarsal on the right lateral foot. This may actually represent a bunion deformity. Last week she had a punched-out area with nonviable surface I removed with a curette. We have been using Iodoflex to clean up the wound bed. She said this hurt for 2 or 3 days but it is settled down now. She is changing the dressing herself. ELA, CHAMPION ([id number]) [id number] Page 6 of 7 Objective Constitutional Sitting or standing Blood Pressure is within target range for patient.. Pulse regular and within target range for patient.Marland Kitchen Respirations regular, non-labored and within target range.. Temperature is normal and within the target range for the patient.Marland Kitchen Appears in no distress. Vitals Time Taken: 2:10 PM, Height: 62 in, Weight: 105 lbs, BMI: 19.2, Temperature: 98.2 F, Pulse: 71 bpm, Respiratory Rate: 16 breaths/min, Blood Pressure: 144/82 mmHg. General Notes: Wound exam; the area questions at the base of the right fifth metatarsal. I suspect there is a chronic friction related injury here. The base of this wound looked a lot better than last week. No mechanical debridement is necessary. Integumentary (Hair,  Skin) Wound #10 status is Open. Original cause of wound was Gradually Appeared. The date acquired was: 06/19/2023. The wound has been in treatment 7 weeks. The wound is located on the Right,Lateral Foot. The wound measures 0.6cm length x 0.4cm width x 0.3cm depth; 0.188cm^2 area and 0.057cm^3 volume. There is Fat Layer (Subcutaneous Tissue) exposed. There is no tunneling or undermining noted. There is a medium amount of serosanguineous drainage noted. The wound margin is distinct with the outline attached to the wound base. There is medium (34-66%) pink, pale granulation within the wound bed. There is a medium (34-66%) amount of necrotic tissue within the wound bed including Adherent Slough. The periwound skin appearance exhibited: Dry/Scaly. The periwound skin appearance did not exhibit: Callus, Crepitus, Excoriation, Induration, Rash, Scarring, Maceration, Atrophie Blanche, Cyanosis, Ecchymosis, Hemosiderin Staining, Mottled, Pallor, Rubor, Erythema. Assessment Active Problems ICD-10 Pressure ulcer of other site, stage 3 Non-pressure chronic ulcer of other part of left lower leg with fat layer exposed Essential (primary) hypertension Plan Follow-up Appointments: Return Appointment in 1 week. - with Dr. Leanord Hawking Wednesday 09/05/2023 130pm (already scheduled) Return appointment in 3 weeks. - Wednesday Dr. Leanord Hawking (Front office to schedule) Nurse Visit: - 09/12/2023 nurse visit Wednesday (already scheduled) Anesthetic: (In clinic) Topical Lidocaine 4% applied to wound bed Cellular or Tissue Based Products: Other Cellular or Tissue Based Products Orders/Instructions: - insurance auth for [place] from [employer]: May shower and wash wound with soap and water. Edema Control - Orders / Instructions: Elevate legs to the level of the heart or above for 30 minutes daily and/or when sitting for 3-4 times a day throughout the day. Avoid standing for long periods of  time. WOUND #10: - Foot Wound Laterality: Right, Lateral Cleanser: Soap and Water Every Other Day/30 Days Discharge Instructions: May shower and wash wound with dial antibacterial soap and water prior to dressing change. Peri-Wound Care: Sween Lotion (Moisturizing lotion) Every Other Day/30 Days Discharge Instructions: Apply moisturizing lotion as directed Prim Dressing: IODOFLEX 0.9% Cadexomer Iodine Pad 4x6 cm Every Other  Day/30 Days ary Discharge Instructions: Apply to wound bed as instructed Secondary Dressing: Optifoam Non-Adhesive Dressing, 4x4 in Every Other Day/30 Days Discharge Instructions: Apply over primary dressing x1 layer foam donut. Secondary Dressing: Woven Gauze Sponges 2x2 in Every Other Day/30 Days Discharge Instructions: Apply over primary dressing as directed. Secondary Dressing: Zetuvit Plus Silicone Border Dressing 4x4 (in/in) Every Other Day/30 Days Discharge Instructions: Apply silicone border over primary dressing as directed. 1. Continue with Iodoflex, bordered foam. We are going to also put a felt donut under the border foam today. 2. She has a felt donut fashion by the nurses and her offloading shoe 3. Look towards moving towards endoform possibly a skin substitute within the next week or 2. Electronic Signature(s) Signed: 08/30/2023 4:06:32 PM By: Baltazar Najjar MD Entered By: Baltazar Najjar on 08/29/2023 12:16:27 Jenna Luo ([id number]) [id number] Page 7 of 7 -------------------------------------------------------------------------------- SuperBill Details Patient Name: Date of Service: [place], [name] 08/29/2023 Medical Record Number: [id number] Patient Account Number: [id number] Date of Birth/Sex: Treating RN: 02/21/1933 (89 y.o. Debara Pickett, Millard.Loa Primary Care Provider: [name] Other Clinician: Referring Provider: Treating Provider/Extender: Trevor Mace in Treatment: 14 Diagnosis  Coding ICD-10 Codes Code Description [phone] Chronic venous hypertension (idiopathic) with ulcer and inflammation of left lower extremity L97.822 Non-pressure chronic ulcer of other part of left lower leg with fat layer exposed L89.893 Pressure ulcer of other site, stage 3 L97.822 Non-pressure chronic ulcer of other part of left lower leg with fat layer exposed I10 Essential (primary) hypertension Facility Procedures : CPT4 Code: [id number] Description: 99213 - WOUND CARE VISIT-LEV 3 EST PT Modifier: Quantity: 1 Physician Procedures : CPT4 Code Description Modifier [id number] 99213 - WC PHYS LEVEL 3 - EST PT ICD-10 Diagnosis Description L89.893 Pressure ulcer of other site, stage 3 Quantity: 1 Electronic Signature(s) Signed: 08/30/2023 4:06:32 PM By: Baltazar Najjar MD Entered By: Baltazar Najjar on 08/29/2023 12:17:04

## 2023-09-04 DIAGNOSIS — E7800 Pure hypercholesterolemia, unspecified: Secondary | ICD-10-CM | POA: Diagnosis not present

## 2023-09-04 DIAGNOSIS — E871 Hypo-osmolality and hyponatremia: Secondary | ICD-10-CM | POA: Diagnosis not present

## 2023-09-04 DIAGNOSIS — M0600 Rheumatoid arthritis without rheumatoid factor, unspecified site: Secondary | ICD-10-CM | POA: Diagnosis not present

## 2023-09-04 DIAGNOSIS — I10 Essential (primary) hypertension: Secondary | ICD-10-CM | POA: Diagnosis not present

## 2023-09-04 DIAGNOSIS — H6123 Impacted cerumen, bilateral: Secondary | ICD-10-CM | POA: Diagnosis not present

## 2023-09-05 ENCOUNTER — Encounter (HOSPITAL_BASED_OUTPATIENT_CLINIC_OR_DEPARTMENT_OTHER): Payer: PPO | Admitting: Internal Medicine

## 2023-09-05 DIAGNOSIS — L89893 Pressure ulcer of other site, stage 3: Secondary | ICD-10-CM | POA: Diagnosis not present

## 2023-09-05 DIAGNOSIS — L97822 Non-pressure chronic ulcer of other part of left lower leg with fat layer exposed: Secondary | ICD-10-CM | POA: Diagnosis not present

## 2023-09-06 DIAGNOSIS — I87312 Chronic venous hypertension (idiopathic) with ulcer of left lower extremity: Secondary | ICD-10-CM | POA: Diagnosis not present

## 2023-09-06 NOTE — Progress Notes (Signed)
[name], [name] ([id number]) [id number] Page 1 of 7 Visit Report for 09/05/2023 HPI Details Patient Name: Date of Service: [name], [name] 09/05/2023 1:30 PM Medical Record Number: [id number] Patient Account Number: [id number] Date of Birth/Sex: Treating RN: 04/05/1933 (89 y.o. F) Primary Care Provider: Thora [name] Other Clinician: Referring Provider: Treating Provider/Extender: [name] in Treatment: 15 History of Present Illness HPI Description: READMISSION 07/15/2019 Patient is now an 89 year old woman who was previously here in 2016 and 2018. Cared for at the time by Dr. Meyer Russel. Both times with wounds related to trauma in the left leg. She was felt to have underlying venous insufficiency although both occasions were related to trauma. The patient tells me that 2 weeks ago she hit her lateral left leg on the car door. This was a skin tear with a flap for a period of time she has been using Polysporin. The flap came off recently she has a clean looking superficial wound on the left lateral leg. Our intake nurse reported some drainage. Past medical history; includes sensorineural hearing loss, osteoarthritis, hypertension and a hammertoe on the right foot for which she follows with podiatry. ABIs in our clinic were 1.19 on the left 07/21/2019; the patient did not like the wraps. They slid down and rub the wound the wound is measuring larger she is upset. 10/12; left lateral leg wound. Most of this looks healthy even under illumination. We have been using Hydrofera Blue under border foam. She would not allow compression 10/19; left lateral leg wound. 2 small open areas remain of the original wound. We have been using Hydrofera Blue under border foam. This seems to be making decent progress 10/26 left lateral leg traumatic wound. There is no open wound remaining. We have been using Hydrofera Blue under border foam she arrived with  some denuded epithelium that I removed there is no open wound remaining. Is fairly clear she has some degree of chronic venous insufficiency with not a lot of edema but dilated veins in her feet. She reminds me that she also has reactive arthritis and was on prednisone for a prolonged period of time. Nevertheless I think it would be beneficial for her to at least wear support stockings but right now I do not think she is going to agree to the Readmission: 02/18/2020 upon evaluation today patient has sustained a skin tear which occurred about 1 week ago she tells me. Fortunately there does not appear to be any signs of active infection at this time which is excellent news. She has been tolerating the dressing changes without complication. Overall very pleased with where things stand at this point. The only issue that I see here is that the skin flap somewhat folded back and then reattached further down causing an area that is actually bunched up to form where it closed on itself but then reattached on the end of the tissue. Nonetheless I think we have to trim off the bunched up tissue in order to allow this to heal appropriately. That is the only issue I really see today. 03/10/2020 upon evaluation today patient actually appears to be doing excellent at this time. She has healed quite nicely and has been a couple of weeks since I saw her. Overall I feel like she is completely healed and ready for discharge as of today. Readmission: 03-21-2023 upon evaluation today patient appears to be doing somewhat poorly in regard to the left wound ulcer she tells me has been present  for about a month. She tells me that she had this on the metal flatbed shopping carts at [facility] and subsequently has been having issues here with this since she tells me that the original Band-Aid she was using caused this to get worse. With that being said I do not see any evidence of active infection at this time everything seems to  really be doing quite well as far as I am concerned. 03-28-2023 upon evaluation today patient appears to be doing well currently in regard to her wounds which in fact appear to be completely healed. I am very pleased with this. Readmission: 05-23-2023 upon evaluation patient presents for reevaluation here in the clinic concerning issues that she has been having with her left anterior lower extremity. This actually appears to have a significant amount of new skin growth over top of the areas of irritation I am not exactly sure what happened here to be perfectly honest. I explained to the patient that this is kind of an unusual presentation for this to be this irritated and yet not have any significant openings at this time. I do not see any evidence of active infection locally or systemically which is great news. 05-30-2023 upon evaluation today patient appears to be doing well currently in regard to her lower extremity wounds which are actually drying out the may be a little bit too dry. I think PolyMem may be better for her and we discussed that today. Fortunately I do not see any signs of active infection locally or systemically which is great news. 06-06-2023 upon evaluation today patient appears to be doing well currently in regard to her wounds. Both are showing signs of improvement which is great news. Fortunately I do not see any signs of active infection locally or systemically at this time. 8/28; the distal wound on the left leg is healed she still has a comma shaped proximal wound medially. She reminds as she will be leaving for [place] on September night. The remaining wound is on the right upper medial calf a comma shaped wound this may be closed by that time. We had some discussion about compression stockings 06-20-2023 upon evaluation today patient appears to be doing well currently in regard to her wounds in fact most everything appears to be about healed although she does have some dry skin  over top of the distal left lateral leg. The proximal medial leg is very close to closure and seems to be doing better. 07-11-2023 upon evaluation today patient appears to be doing well currently in regard to her wound. She has been tolerating the dressing changes without LUTHER, MOSHER ([id number]) [phone].pdf Page 2 of 7 complication. Fortunately there does not appear to be any signs of infection locally or systemically which is great news. Unfortunately she has been having issues with colitis and she appears to be very pale today she also tells me that she has been feeling fairly poorly. Her rheumatologist to put her on prednisone she is also been put on antibiotics for the colitis. Her wounds on the left anterior lower extremity have reinitiated a little bit here and I am beginning to suspect this may be more autoimmune related. 10-to-24 upon evaluation today patient appears to be doing well currently at this point with regard to her wound. She has been tolerating the dressing changes without complication. Fortunately I do not see any signs of worsening overall I believe that the patient is making good headway towards complete closure which is great news.  Still have a little concerned about the possibility of her having some issues here with a rheumatologic or dermatologic issue. I do believe that the steroid ointment has done a little bit better for her the triamcinolone I prescribed last week. 07-25-2023 upon evaluation today patient appears to be doing really about the same in regard to her wounds. The steroid ointment does not seem to make in a big difference unfortunately. Fortunately I do not see any signs of active infection locally or systemically which is good news. With that being said she does have 2 new pustules that have shown up she states they started looking like pimples initially and then have gotten worse since. With that being said I do believe that we  need to see what we can do to try to get this under control she does have an appointment with dermatology next week in the meantime I am actually see about getting her started with a antibiotic ointment instead of the steroid. 08-01-2023 upon evaluation today patient appears to be doing well currently in regard to her left leg which is actually showing signs of improvement the antibiotic ointment actually seems to be doing quite well. I am actually very pleased with where we stand currently. Fortunately I do not see any signs of active infection locally or systemically at this time. No fevers, chills, nausea, vomiting, or diarrhea. 08-08-2023 upon evaluation today patient appears to be doing actually pretty well in regard to her wound compared to where we were previous. I do think that the erythema has improved the wound size is getting a little bit smaller as I see her each week and in general I think that we are on the right track. There is no signs of significant infection although there is erythema I really feel like this may be more pressure related as it is blanchable as opposed to infection. Nonetheless I been leery to go forward with doing an oral antibiotic due to the history with the GI upset that she had more recently where she got very sick. Nonetheless if she is not significantly improved come next week we may need to consider going forward with the oral antibiotics just to be on the safe side for now we will continue topical mupirocin. 08-15-2023 upon evaluation today patient appears to be doing well currently in regard to her wound. She is actually showing signs of good improvement she does have some slough and biofilm noted we will get a going perform some debridement to clear away the slough and biofilm down to good subcutaneous tissue my hope is this will help to get the area to heal much more effectively and quickly with the collagen. 11/6; patient has an open wound at the base of  her fifth metatarsal on the right lateral foot. Small punched-out area with a nonviable surface some erythema around the wound which almost looks like a chronic bursitis rather than infection. She has previously had wounds on the left leg which are apparently healed 11/13; a wound at the base of her fifth metatarsal on the right lateral foot. This may actually represent a bunion deformity. Last week she had a punched-out area with nonviable surface I removed with a curette. We have been using Iodoflex to clean up the wound bed. She said this hurt for 2 or 3 days but it is settled down now. She is changing the dressing herself. 11/20; fifth metatarsal base at the right lateral foot. I think this is a chronic bursitis type  deformity/bunion deformity. We used Iodoflex up until now. Hope we will be changed to endoform today. No debridement. Electronic Signature(s) Signed: 09/05/2023 4:37:54 PM By: Baltazar Najjar MD Entered By: Baltazar Najjar on 09/05/2023 14:34:02 -------------------------------------------------------------------------------- Physical Exam Details Patient Name: Date of Service: [place], [name] 09/05/2023 1:30 PM Medical Record Number: [id number] Patient Account Number: [id number] Date of Birth/Sex: Treating RN: May 10, 1933 (89 y.o. F) Primary Care Provider: Thora [name] Other Clinician: Referring Provider: Treating Provider/Extender: [name] in Treatment: 15 Constitutional Sitting or standing Blood Pressure is within target range for patient.. Pulse regular and within target range for patient.Marland Kitchen Respirations regular, non-labored and within target range.. Temperature is normal and within the target range for the patient.Marland Kitchen Appears in no distress. Notes Wound exam; the area in question is at the base of the right fifth metatarsal. This always looks somewhat irritated. Base of it looks a little better not the type of vibrant granulation I would  like to see but it seems to have come in a bit. I am going to change the dressing to endoform today. No evidence of surrounding infection Electronic Signature(s) Signed: 09/05/2023 4:37:54 PM By: Baltazar Najjar MD Entered By: Baltazar Najjar on 09/05/2023 14:34:57 [name] ([id number]) [id number] Page 3 of 7 -------------------------------------------------------------------------------- Physician Orders Details Patient Name: Date of Service: [place], [name] 09/05/2023 1:30 PM Medical Record Number: [id number] Patient Account Number: [id number] Date of Birth/Sex: Treating RN: 1932/11/30 (89 y.o. [name] Primary Care Provider: Thora [name] Other Clinician: Referring Provider: Treating Provider/Extender: [name] in Treatment: 15 Verbal / Phone Orders: No Diagnosis Coding Follow-up Appointments ppointment in 1 week. - with Dr. Leanord Hawking Wednesday 09/12/2023 11am (already scheduled) Return A Return appointment in 3 weeks. - Wednesday Dr. Leanord Hawking (Front office to schedule) Nurse Visit: - 09/12/2023 nurse visit Wednesday (already scheduled) Anesthetic (In clinic) Topical Lidocaine 4% applied to wound bed Cellular or Tissue Based Products Other Cellular or Tissue Based Products Orders/Instructions: - insurance auth for N[place] from [employer] May shower and wash wound with soap and water. Edema Control - Orders / Instructions Elevate legs to the level of the heart or above for 30 minutes daily and/or when sitting for 3-4 times a day throughout the day. Avoid standing for long periods of time. Wound Treatment Wound #10 - Foot Wound Laterality: Right, Lateral Cleanser: Soap and Water Every Other Day/30 Days Discharge Instructions: May shower and wash wound with dial antibacterial soap and water prior to dressing change. Peri-Wound Care: Sween Lotion (Moisturizing lotion) Every Other  Day/30 Days Discharge Instructions: Apply moisturizing lotion as directed Prim Dressing: Endoform 2x2 in (DME) (Generic) Every Other Day/30 Days ary Discharge Instructions: Moisten with saline place into wound bed Secondary Dressing: Optifoam Non-Adhesive Dressing, 4x4 in Every Other Day/30 Days Discharge Instructions: Apply over primary dressing x1 layer foam donut. Secondary Dressing: Woven Gauze Sponges 2x2 in (DME) (Generic) Every Other Day/30 Days Discharge Instructions: Apply over primary dressing as directed. Secondary Dressing: Zetuvit Plus Silicone Border Dressing 4x4 (in/in) (DME) (Generic) Every Other Day/30 Days Discharge Instructions: Apply silicone border over primary dressing as directed. Electronic Signature(s) Signed: 09/05/2023 4:37:54 PM By: Baltazar Najjar MD Signed: 09/05/2023 5:21:16 PM By: Karie Schwalbe RN Entered By: Karie Schwalbe on 09/05/2023 14:18:27 -------------------------------------------------------------------------------- Problem List Details Patient Name: Date of Service: [place], [name] 09/05/2023 1:30 PM Medical Record Number: [id number] Patient Account Number: [id number] Date of Birth/Sex: Treating RN: 08-17-1933 (89 y.o.  F) Primary Care Provider: Thora [name] Other Clinician: CHEDVA, ALLEYNE ([id number]) [id number] Page 4 of 7 Referring Provider: Treating Provider/Extender: [name] in Treatment: 15 Active Problems ICD-10 Encounter Code Description Active Date MDM Diagnosis L89.893 Pressure ulcer of other site, stage 3 07/11/2023 No Yes I10 Essential (primary) hypertension 05/23/2023 No Yes Inactive Problems ICD-10 Code Description Active Date Inactive Date I87.332 Chronic venous hypertension (idiopathic) with ulcer and inflammation of left lower 05/23/2023 05/23/2023 extremity L97.822 Non-pressure chronic ulcer of other part of left lower leg with fat layer exposed 05/23/2023  05/23/2023 N82.956 Non-pressure chronic ulcer of other part of left lower leg with fat layer exposed 05/23/2023 05/23/2023 Resolved Problems Electronic Signature(s) Signed: 09/05/2023 4:37:54 PM By: Baltazar Najjar MD Entered By: Baltazar Najjar on 09/05/2023 14:32:06 -------------------------------------------------------------------------------- Progress Note Details Patient Name: Date of Service: [place], [name] 09/05/2023 1:30 PM Medical Record Number: [id number] Patient Account Number: [id number] Date of Birth/Sex: Treating RN: 1933-05-22 (89 y.o. F) Primary Care Provider: Thora [name] Other Clinician: Referring Provider: Treating Provider/Extender: [name] in Treatment: 15 Subjective History of Present Illness (HPI) READMISSION 07/15/2019 Patient is now an 89 year old woman who was previously here in 2016 and 2018. Cared for at the time by Dr. Meyer Russel. Both times with wounds related to trauma in the left leg. She was felt to have underlying venous insufficiency although both occasions were related to trauma. The patient tells me that 2 weeks ago she hit her lateral left leg on the car door. This was a skin tear with a flap for a period of time she has been using Polysporin. The flap came off recently she has a clean looking superficial wound on the left lateral leg. Our intake nurse reported some drainage. Past medical history; includes sensorineural hearing loss, osteoarthritis, hypertension and a hammertoe on the right foot for which she follows with podiatry. ABIs in our clinic were 1.19 on the left 07/21/2019; the patient did not like the wraps. They slid down and rub the wound the wound is measuring larger she is upset. 10/12; left lateral leg wound. Most of this looks healthy even under illumination. We have been using Hydrofera Blue under border foam. She would not allow compression 10/19; left lateral leg wound. 2 small open areas remain of the  original wound. We have been using Hydrofera Blue under border foam. This seems to be making decent progress 10/26 left lateral leg traumatic wound. There is no open wound remaining. We have been using Hydrofera Blue under border foam she arrived with some JENA, WINEBERG ([id number]) [id number] Page 5 of 7 denuded epithelium that I removed there is no open wound remaining. Is fairly clear she has some degree of chronic venous insufficiency with not a lot of edema but dilated veins in her feet. She reminds me that she also has reactive arthritis and was on prednisone for a prolonged period of time. Nevertheless I think it would be beneficial for her to at least wear support stockings but right now I do not think she is going to agree to the Readmission: 02/18/2020 upon evaluation today patient has sustained a skin tear which occurred about 1 week ago she tells me. Fortunately there does not appear to be any signs of active infection at this time which is excellent news. She has been tolerating the dressing changes without complication. Overall very pleased with where things stand at this point. The only issue that I see here is  that the skin flap somewhat folded back and then reattached further down causing an area that is actually bunched up to form where it closed on itself but then reattached on the end of the tissue. Nonetheless I think we have to trim off the bunched up tissue in order to allow this to heal appropriately. That is the only issue I really see today. 03/10/2020 upon evaluation today patient actually appears to be doing excellent at this time. She has healed quite nicely and has been a couple of weeks since I saw her. Overall I feel like she is completely healed and ready for discharge as of today. Readmission: 03-21-2023 upon evaluation today patient appears to be doing somewhat poorly in regard to the left wound ulcer she tells me has been present for about a  month. She tells me that she had this on the metal flatbed shopping carts at [facility] and subsequently has been having issues here with this since she tells me that the original Band-Aid she was using caused this to get worse. With that being said I do not see any evidence of active infection at this time everything seems to really be doing quite well as far as I am concerned. 03-28-2023 upon evaluation today patient appears to be doing well currently in regard to her wounds which in fact appear to be completely healed. I am very pleased with this. Readmission: 05-23-2023 upon evaluation patient presents for reevaluation here in the clinic concerning issues that she has been having with her left anterior lower extremity. This actually appears to have a significant amount of new skin growth over top of the areas of irritation I am not exactly sure what happened here to be perfectly honest. I explained to the patient that this is kind of an unusual presentation for this to be this irritated and yet not have any significant openings at this time. I do not see any evidence of active infection locally or systemically which is great news. 05-30-2023 upon evaluation today patient appears to be doing well currently in regard to her lower extremity wounds which are actually drying out the may be a little bit too dry. I think PolyMem may be better for her and we discussed that today. Fortunately I do not see any signs of active infection locally or systemically which is great news. 06-06-2023 upon evaluation today patient appears to be doing well currently in regard to her wounds. Both are showing signs of improvement which is great news. Fortunately I do not see any signs of active infection locally or systemically at this time. 8/28; the distal wound on the left leg is healed she still has a comma shaped proximal wound medially. She reminds as she will be leaving for [place] on September night. The remaining wound  is on the right upper medial calf a comma shaped wound this may be closed by that time. We had some discussion about compression stockings 06-20-2023 upon evaluation today patient appears to be doing well currently in regard to her wounds in fact most everything appears to be about healed although she does have some dry skin over top of the distal left lateral leg. The proximal medial leg is very close to closure and seems to be doing better. 07-11-2023 upon evaluation today patient appears to be doing well currently in regard to her wound. She has been tolerating the dressing changes without complication. Fortunately there does not appear to be any signs of infection locally or systemically which is  great news. Unfortunately she has been having issues with colitis and she appears to be very pale today she also tells me that she has been feeling fairly poorly. Her rheumatologist to put her on prednisone she is also been put on antibiotics for the colitis. Her wounds on the left anterior lower extremity have reinitiated a little bit here and I am beginning to suspect this may be more autoimmune related. 10-to-24 upon evaluation today patient appears to be doing well currently at this point with regard to her wound. She has been tolerating the dressing changes without complication. Fortunately I do not see any signs of worsening overall I believe that the patient is making good headway towards complete closure which is great news. Still have a little concerned about the possibility of her having some issues here with a rheumatologic or dermatologic issue. I do believe that the steroid ointment has done a little bit better for her the triamcinolone I prescribed last week. 07-25-2023 upon evaluation today patient appears to be doing really about the same in regard to her wounds. The steroid ointment does not seem to make in a big difference unfortunately. Fortunately I do not see any signs of active infection  locally or systemically which is good news. With that being said she does have 2 new pustules that have shown up she states they started looking like pimples initially and then have gotten worse since. With that being said I do believe that we need to see what we can do to try to get this under control she does have an appointment with dermatology next week in the meantime I am actually see about getting her started with a antibiotic ointment instead of the steroid. 08-01-2023 upon evaluation today patient appears to be doing well currently in regard to her left leg which is actually showing signs of improvement the antibiotic ointment actually seems to be doing quite well. I am actually very pleased with where we stand currently. Fortunately I do not see any signs of active infection locally or systemically at this time. No fevers, chills, nausea, vomiting, or diarrhea. 08-08-2023 upon evaluation today patient appears to be doing actually pretty well in regard to her wound compared to where we were previous. I do think that the erythema has improved the wound size is getting a little bit smaller as I see her each week and in general I think that we are on the right track. There is no signs of significant infection although there is erythema I really feel like this may be more pressure related as it is blanchable as opposed to infection. Nonetheless I been leery to go forward with doing an oral antibiotic due to the history with the GI upset that she had more recently where she got very sick. Nonetheless if she is not significantly improved come next week we may need to consider going forward with the oral antibiotics just to be on the safe side for now we will continue topical mupirocin. 08-15-2023 upon evaluation today patient appears to be doing well currently in regard to her wound. She is actually showing signs of good improvement she does have some slough and biofilm noted we will get a going  perform some debridement to clear away the slough and biofilm down to good subcutaneous tissue my hope is this will help to get the area to heal much more effectively and quickly with the collagen. 11/6; patient has an open wound at the base of her fifth metatarsal  on the right lateral foot. Small punched-out area with a nonviable surface some erythema around the wound which almost looks like a chronic bursitis rather than infection. She has previously had wounds on the left leg which are apparently healed 11/13; a wound at the base of her fifth metatarsal on the right lateral foot. This may actually represent a bunion deformity. Last week she had a punched-out area with nonviable surface I removed with a curette. We have been using Iodoflex to clean up the wound bed. She said this hurt for 2 or 3 days but it is settled down now. She is changing the dressing herself. 11/20; fifth metatarsal base at the right lateral foot. I think this is a chronic bursitis type deformity/bunion deformity. We used Iodoflex up until now. Hope we will be changed to endoform today. No debridement. [name], [name] ([id number]) [id number] Page 6 of 7 Objective Constitutional Sitting or standing Blood Pressure is within target range for patient.. Pulse regular and within target range for patient.Marland Kitchen Respirations regular, non-labored and within target range.. Temperature is normal and within the target range for the patient.Marland Kitchen Appears in no distress. Vitals Time Taken: 1:36 PM, Height: 62 in, Weight: 105 lbs, BMI: 19.2, Temperature: 97.7 F, Pulse: 52 bpm, Respiratory Rate: 18 breaths/min, Blood Pressure: 150/61 mmHg. General Notes: Wound exam; the area in question is at the base of the right fifth metatarsal. This always looks somewhat irritated. Base of it looks a little better not the type of vibrant granulation I would like to see but it seems to have come in a bit. I am going to change the  dressing to endoform today. No evidence of surrounding infection Integumentary (Hair, Skin) Wound #10 status is Open. Original cause of wound was Gradually Appeared. The date acquired was: 06/19/2023. The wound has been in treatment 8 weeks. The wound is located on the Right,Lateral Foot. The wound measures 0.7cm length x 0.8cm width x 0.3cm depth; 0.44cm^2 area and 0.132cm^3 volume. There is Fat Layer (Subcutaneous Tissue) exposed. There is no tunneling or undermining noted. There is a medium amount of serosanguineous drainage noted. The wound margin is distinct with the outline attached to the wound base. There is small (1-33%) pink, pale granulation within the wound bed. There is a large (67- 100%) amount of necrotic tissue within the wound bed including Adherent Slough. The periwound skin appearance exhibited: Dry/Scaly. The periwound skin appearance did not exhibit: Callus, Crepitus, Excoriation, Induration, Rash, Scarring, Maceration, Atrophie Blanche, Cyanosis, Ecchymosis, Hemosiderin Staining, Mottled, Pallor, Rubor, Erythema. Periwound temperature was noted as No Abnormality. Assessment Active Problems ICD-10 Pressure ulcer of other site, stage 3 Essential (primary) hypertension Plan Follow-up Appointments: Return Appointment in 1 week. - with Dr. Leanord Hawking Wednesday 09/12/2023 11am (already scheduled) Return appointment in 3 weeks. - Wednesday Dr. Leanord Hawking (Front office to schedule) Nurse Visit: - 09/12/2023 nurse visit Wednesday (already scheduled) Anesthetic: (In clinic) Topical Lidocaine 4% applied to wound bed Cellular or Tissue Based Products: Other Cellular or Tissue Based Products Orders/Instructions: - insurance auth for [place] from [employer]: May shower and wash wound with soap and water. Edema Control - Orders / Instructions: Elevate legs to the level of the heart or above for 30 minutes daily and/or when sitting for 3-4 times a day throughout  the day. Avoid standing for long periods of time. WOUND #10: - Foot Wound Laterality: Right, Lateral Cleanser: Soap and Water Every Other Day/30 Days Discharge Instructions: May shower and wash wound with dial antibacterial  soap and water prior to dressing change. Peri-Wound Care: Sween Lotion (Moisturizing lotion) Every Other Day/30 Days Discharge Instructions: Apply moisturizing lotion as directed Prim Dressing: Endoform 2x2 in (DME) (Generic) Every Other Day/30 Days ary Discharge Instructions: Moisten with saline place into wound bed Secondary Dressing: Optifoam Non-Adhesive Dressing, 4x4 in Every Other Day/30 Days Discharge Instructions: Apply over primary dressing x1 layer foam donut. Secondary Dressing: Woven Gauze Sponges 2x2 in (DME) (Generic) Every Other Day/30 Days Discharge Instructions: Apply over primary dressing as directed. Secondary Dressing: Zetuvit Plus Silicone Border Dressing 4x4 (in/in) (DME) (Generic) Every Other Day/30 Days Discharge Instructions: Apply silicone border over primary dressing as directed. 1. Change the primary dressing to endoform with a nonadhesive cover. 2. Border foam Electronic Signature(s) Signed: 09/05/2023 4:37:54 PM By: Baltazar Najjar MD Entered By: Baltazar Najjar on 09/05/2023 14:35:45 [name] ([id number]) [id number] Page 7 of 7 -------------------------------------------------------------------------------- SuperBill Details Patient Name: Date of Service: [place], [name] 09/05/2023 Medical Record Number: [id number] Patient Account Number: [id number] Date of Birth/Sex: Treating RN: 02-12-1933 (89 y.o. [name] Primary Care Provider: Thora [name] Other Clinician: Referring Provider: Treating Provider/Extender: [name] in Treatment: 15 Diagnosis Coding ICD-10 Codes Code Description [phone] Chronic venous hypertension (idiopathic) with ulcer and  inflammation of left lower extremity L97.822 Non-pressure chronic ulcer of other part of left lower leg with fat layer exposed L89.893 Pressure ulcer of other site, stage 3 L97.822 Non-pressure chronic ulcer of other part of left lower leg with fat layer exposed I10 Essential (primary) hypertension Facility Procedures : CPT4 Code: 25956387 Description: 99213 - WOUND CARE VISIT-LEV 3 EST PT Modifier: Quantity: 1 Physician Procedures : CPT4 Code Description Modifier [id number] 99213 - WC PHYS LEVEL 3 - EST PT ICD-10 Diagnosis Description L89.893 Pressure ulcer of other site, stage 3 Quantity: 1 Electronic Signature(s) Signed: 09/05/2023 4:37:54 PM By: Baltazar Najjar MD Entered By: Baltazar Najjar on 09/05/2023 14:36:07

## 2023-09-07 NOTE — Progress Notes (Signed)
[name], [name] ([id number]) [id number] Page 1 of 7 Visit Report for 09/05/2023 Arrival Information Details Patient Name: Date of Service: [place], [name] 09/05/2023 1:30 PM Medical Record Number: [id number] Patient Account Number: [id number] Date of Birth/Sex: Treating RN: 1932/12/25 (89 y.o. F) Primary Care Aracely Rickett: [name] Other Clinician: Referring Shravan Salahuddin: Treating Keysean Savino/Extender: Trevor Mace in Treatment: 15 Visit Information History Since Last Visit Added or deleted any medications: No Patient Arrived: Ambulatory Any new allergies or adverse reactions: No Arrival Time: 13:33 Had a fall or experienced change in No Accompanied By: self activities of daily living that may affect Transfer Assistance: None risk of falls: Patient Identification Verified: Yes Signs or symptoms of abuse/neglect since last visito No Secondary Verification Process Completed: Yes Hospitalized since last visit: No Patient Requires Transmission-Based Precautions: No Implantable device outside of the clinic excluding No Patient Has Alerts: No cellular tissue based products placed in the center since last visit: Has Dressing in Place as Prescribed: Yes Has Compression in Place as Prescribed: No Pain Present Now: No Electronic Signature(s) Signed: 09/07/2023 11:01:27 AM By: Thayer Dallas Entered By: Thayer Dallas on 09/05/2023 10:36:45 -------------------------------------------------------------------------------- Clinic Level of Care Assessment Details Patient Name: Date of Service: [place], [name] 09/05/2023 1:30 PM Medical Record Number: [id number] Patient Account Number: [id number] Date of Birth/Sex: Treating RN: 16-Dec-1932 (89 y.o. [name] Primary Care Dhaval Woo: [name] Other Clinician: Referring Lissandro Dilorenzo: Treating Emie Sommerfeld/Extender: Trevor Mace in Treatment: 15 Clinic Level  of Care Assessment Items TOOL 4 Quantity Score X- 1 0 Use when only an EandM is performed on FOLLOW-UP visit ASSESSMENTS - Nursing Assessment / Reassessment X- 1 10 Reassessment of Co-morbidities (includes updates in patient status) X- 1 5 Reassessment of Adherence to Treatment Plan ASSESSMENTS - Wound and Skin A ssessment / Reassessment X - Simple Wound Assessment / Reassessment - one wound 1 5 []  - 0 Complex Wound Assessment / Reassessment - multiple wounds []  - 0 Dermatologic / Skin Assessment (not related to wound area) ASSESSMENTS - Focused Assessment []  - 0 Circumferential Edema Measurements - multi extremities []  - 0 Nutritional Assessment / Counseling / Intervention [name], [name] ([id number]) [id number] Page 2 of 7 []  - 0 Lower Extremity Assessment (monofilament, tuning fork, pulses) []  - 0 Peripheral Arterial Disease Assessment (using hand held doppler) ASSESSMENTS - Ostomy and/or Continence Assessment and Care []  - 0 Incontinence Assessment and Management []  - 0 Ostomy Care Assessment and Management (repouching, etc.) PROCESS - Coordination of Care X - Simple Patient / Family Education for ongoing care 1 15 []  - 0 Complex (extensive) Patient / Family Education for ongoing care X- 1 10 Staff obtains Chiropractor, Records, T Results / Process Orders est X- 1 10 Staff telephones HHA, Nursing Homes / Clarify orders / etc []  - 0 Routine Transfer to another Facility (non-emergent condition) []  - 0 Routine Hospital Admission (non-emergent condition) []  - 0 New Admissions / Manufacturing engineer / Ordering NPWT Apligraf, etc. , []  - 0 Emergency Hospital Admission (emergent condition) X- 1 10 Simple Discharge Coordination []  - 0 Complex (extensive) Discharge Coordination PROCESS - Special Needs []  - 0 Pediatric / Minor Patient Management []  - 0 Isolation Patient Management []  - 0 Hearing / Language / Visual special needs []  -  0 Assessment of Community assistance (transportation, D/C planning, etc.) []  - 0 Additional assistance / Altered mentation []  - 0 Support Surface(s) Assessment (bed, cushion, seat, etc.) INTERVENTIONS - Wound Cleansing /  Measurement X - Simple Wound Cleansing - one wound 1 5 []  - 0 Complex Wound Cleansing - multiple wounds X- 1 5 Wound Imaging (photographs - any number of wounds) []  - 0 Wound Tracing (instead of photographs) X- 1 5 Simple Wound Measurement - one wound []  - 0 Complex Wound Measurement - multiple wounds INTERVENTIONS - Wound Dressings X - Small Wound Dressing one or multiple wounds 1 10 []  - 0 Medium Wound Dressing one or multiple wounds []  - 0 Large Wound Dressing one or multiple wounds []  - 0 Application of Medications - topical []  - 0 Application of Medications - injection INTERVENTIONS - Miscellaneous []  - 0 External ear exam []  - 0 Specimen Collection (cultures, biopsies, blood, body fluids, etc.) []  - 0 Specimen(s) / Culture(s) sent or taken to Lab for analysis []  - 0 Patient Transfer (multiple staff / Nurse, adult / Similar devices) []  - 0 Simple Staple / Suture removal (25 or less) []  - 0 Complex Staple / Suture removal (26 or more) []  - 0 Hypo / Hyperglycemic Management (close monitor of Blood Glucose) [name], [name] ([id number]) [id number] Page 3 of 7 []  - 0 Ankle / Brachial Index (ABI) - do not check if billed separately X- 1 5 Vital Signs Has the patient been seen at the hospital within the last three years: Yes Total Score: 95 Level Of Care: New/Established - Level 3 Electronic Signature(s) Signed: 09/05/2023 5:21:16 PM By: Karie Schwalbe RN Entered By: Karie Schwalbe on 09/05/2023 11:28:44 -------------------------------------------------------------------------------- Encounter Discharge Information Details Patient Name: Date of Service: [place], [name] 09/05/2023 1:30 PM Medical Record Number:  [id number] Patient Account Number: [id number] Date of Birth/Sex: Treating RN: [birth year] (89 y.o. [name] Primary Care Owens Hara: [name] Other Clinician: Referring Kenleigh Toback: Treating Ginelle Bays/Extender: Trevor Mace in Treatment: 15 Encounter Discharge Information Items Discharge Condition: Stable Ambulatory Status: Ambulatory Discharge Destination: Home Transportation: Private Auto Accompanied By: self Schedule Follow-up Appointment: Yes Clinical Summary of Care: Patient Declined Electronic Signature(s) Signed: 09/05/2023 5:21:16 PM By: Karie Schwalbe RN Entered By: Karie Schwalbe on 09/05/2023 14:14:07 -------------------------------------------------------------------------------- Lower Extremity Assessment Details Patient Name: Date of Service: [place], [name] 09/05/2023 1:30 PM Medical Record Number: [id number] Patient Account Number: [id number] Date of Birth/Sex: Treating RN: 11-21-1932 (89 y.o. F) Primary Care Kirsten Spearing: [name] Other Clinician: Referring Arlisha Patalano: Treating Maricella Filyaw/Extender: Trevor Mace in Treatment: 15 Edema Assessment Assessed: [Left: No] [Right: No] Edema: [Left: No] [Right: No] Calf Left: Right: Point of Measurement: From Medial Instep 29.8 cm Ankle Left: Right: Point of Measurement: From Medial Instep 17 cm Vascular Assessment Left: [[id number] Page 4 of 7Right:] Extremity colors, hair growth, and conditions: Extremity Color: [[id number] Page 4 of 7Hyperpigmented Hyperpigmented] Hair Growth on Extremity: [[id number] Page 4 of 7Yes No] Temperature of Extremity: [[id number] Page 4 of 7Warm Cold] Capillary Refill: [id number].pdf Page 4 of 7< 3 seconds < 3 seconds] Dependent Rubor: [[id number] Page 4 of 7Yes  No No No] Electronic Signature(s) Signed: 09/07/2023 11:01:27 AM By: Thayer Dallas Entered By: Thayer Dallas on 09/05/2023 10:42:00 -------------------------------------------------------------------------------- Multi-Disciplinary Care Plan Details Patient Name: Date of Service: [place], [name] 09/05/2023 1:30 PM Medical Record Number: [id number] Patient Account Number: [id number] Date of Birth/Sex: Treating RN: October 29, 1932 (89 y.o. [name] Primary Care Nino Amano: [name] Other Clinician: Referring Claudis Giovanelli: Treating Zahir Eisenhour/Extender: Trevor Mace in Treatment: 15 Multidisciplinary Care Plan reviewed with physician Active Inactive Wound/Skin Impairment Nursing Diagnoses:  Impaired tissue integrity Knowledge deficit related to ulceration/compromised skin integrity Goals: Patient/caregiver will verbalize understanding of skin care regimen Date Initiated: 05/23/2023 Target Resolution Date: 10/16/2023 Goal Status: Active Ulcer/skin breakdown will have a volume reduction of 30% by week 4 Date Initiated: 05/23/2023 Date Inactivated: 08/22/2023 Target Resolution Date: 08/20/2023 Unmet Reason: see wound Goal Status: Unmet measurement. Interventions: Assess patient/caregiver ability to obtain necessary supplies Assess patient/caregiver ability to perform ulcer/skin care regimen upon admission and as needed Assess ulceration(s) every visit Provide education on ulcer and skin care Treatment Activities: Skin care regimen initiated : 05/23/2023 Topical wound management initiated : 05/23/2023 Notes: Electronic Signature(s) Signed: 09/05/2023 5:21:16 PM By: Karie Schwalbe RN Entered By: Karie Schwalbe on 09/05/2023 14:08:07 [name] ([id number]) [id number] Page 5 of 7 -------------------------------------------------------------------------------- Pain Assessment Details Patient Name: Date of Service: [place], [name] 09/05/2023 1:30 PM Medical Record Number: [id number] Patient Account Number: [id number] Date of Birth/Sex: Treating RN: 1933/03/28 (89 y.o. F) Primary Care Jameer Storie: [name] Other Clinician: Referring Amariyana Heacox: Treating Arora Coakley/Extender: Trevor Mace in Treatment: 15 Active Problems Location of Pain Severity and Description of Pain Patient Has Paino No Site Locations Pain Management and Medication Current Pain Management: Electronic Signature(s) Signed: 09/07/2023 11:01:27 AM By: Thayer Dallas Entered By: Thayer Dallas on 09/05/2023 10:41:49 -------------------------------------------------------------------------------- Patient/Caregiver Education Details Patient Name: Date of Service: [place], [name] 11/20/2024andnbsp1:30 PM Medical Record Number: [id number] Patient Account Number: [id number] Date of Birth/Gender: Treating RN: 29-Mar-1933 (89 y.o. [name] Primary Care Physician: [name] Other Clinician: Referring Physician: Treating Physician/Extender: Trevor Mace in Treatment: 15 Education Assessment Education Provided To: Patient Education Topics Provided Wound/Skin Impairment: Methods: Explain/Verbal Responses: State content correctly Electronic Signature(s) Signed: 09/05/2023 5:21:16 PM By: Karie Schwalbe RN Entered By: Karie Schwalbe on 09/05/2023 14:08:21 [name] ([id number]) [id number] Page 6 of 7 -------------------------------------------------------------------------------- Wound Assessment Details Patient Name: Date of Service: [place], [name] 09/05/2023 1:30 PM Medical Record Number: [id number] Patient Account Number: [id number] Date of Birth/Sex: Treating RN: [birth year] (89 y.o. F) Primary Care Greene Diodato: [name] Other Clinician: Referring Cheron Pasquarelli: Treating Kyriana Yankee/Extender: Trevor Mace in  Treatment: 15 Wound Status Wound Number: 10 Primary Pressure Ulcer Etiology: Wound Location: Right, Lateral Foot Wound Status: Open Wounding Event: Gradually Appeared Comorbid Cataracts, Hypertension, Raynauds, Rheumatoid Arthritis, Date Acquired: 06/19/2023 History: Osteoarthritis Weeks Of Treatment: 8 Clustered Wound: No Photos Wound Measurements Length: (cm) 0.7 Width: (cm) 0.8 Depth: (cm) 0.3 Area: (cm) 0.44 Volume: (cm) 0.132 % Reduction in Area: -33.3% % Reduction in Volume: -100% Epithelialization: Small (1-33%) Tunneling: No Undermining: No Wound Description Classification: Category/Stage III Wound Margin: Distinct, outline attached Exudate Amount: Medium Exudate Type: Serosanguineous Exudate Color: red, brown Foul Odor After Cleansing: No Slough/Fibrino Yes Wound Bed Granulation Amount: Small (1-33%) Exposed Structure Granulation Quality: Pink, Pale Fascia Exposed: No Necrotic Amount: Large (67-100%) Fat Layer (Subcutaneous Tissue) Exposed: Yes Necrotic Quality: Adherent Slough Tendon Exposed: No Muscle Exposed: No Joint Exposed: No Bone Exposed: No Periwound Skin Texture Texture Color No Abnormalities Noted: No No Abnormalities Noted: No Callus: No Atrophie Blanche: No Crepitus: No Cyanosis: No Excoriation: No Ecchymosis: No Induration: No Erythema: No Rash: No Hemosiderin Staining: No Scarring: No Mottled: No Pallor: No Moisture Rubor: No No Abnormalities Noted: No Dry / Scaly: Yes Temperature / Pain [name], [name] ([id number]) [id number] Page 7 of 7 Maceration: No Temperature: No Abnormality Treatment Notes Wound #10 (Foot) Wound Laterality: Right, Lateral Cleanser  Soap and Water Discharge Instruction: May shower and wash wound with dial antibacterial soap and water prior to dressing change. Peri-Wound Care Sween Lotion (Moisturizing lotion) Discharge Instruction: Apply moisturizing lotion as  directed Topical Primary Dressing Endoform 2x2 in Discharge Instruction: Moisten with saline place into wound bed Secondary Dressing Optifoam Non-Adhesive Dressing, 4x4 in Discharge Instruction: Apply over primary dressing x1 layer foam donut. Woven Gauze Sponges 2x2 in Discharge Instruction: Apply over primary dressing as directed. Zetuvit Plus Silicone Border Dressing 4x4 (in/in) Discharge Instruction: Apply silicone border over primary dressing as directed. Secured With Compression Wrap Compression Stockings Facilities manager) Signed: 09/07/2023 11:01:27 AM By: Thayer Dallas Entered By: Thayer Dallas on 09/05/2023 10:45:13 -------------------------------------------------------------------------------- Vitals Details Patient Name: Date of Service: [place], [name] 09/05/2023 1:30 PM Medical Record Number: [id number] Patient Account Number: [id number] Date of Birth/Sex: Treating RN: 10-Jan-1933 (89 y.o. F) Primary Care Eiliyah Reh: [name] Other Clinician: Referring Zaedyn Covin: Treating Larone Kliethermes/Extender: Trevor Mace in Treatment: 15 Vital Signs Time Taken: 13:36 Temperature (F): 97.7 Height (in): 62 Pulse (bpm): 52 Weight (lbs): 105 Respiratory Rate (breaths/min): 18 Body Mass Index (BMI): 19.2 Blood Pressure (mmHg): 150/61 Reference Range: 80 - 120 mg / dl Electronic Signature(s) Signed: 09/07/2023 11:01:27 AM By: Thayer Dallas Entered By: Thayer Dallas on 09/05/2023 10:37:10

## 2023-09-10 DIAGNOSIS — Z1589 Genetic susceptibility to other disease: Secondary | ICD-10-CM | POA: Diagnosis not present

## 2023-09-10 DIAGNOSIS — L409 Psoriasis, unspecified: Secondary | ICD-10-CM | POA: Diagnosis not present

## 2023-09-10 DIAGNOSIS — M1991 Primary osteoarthritis, unspecified site: Secondary | ICD-10-CM | POA: Diagnosis not present

## 2023-09-10 DIAGNOSIS — Z79899 Other long term (current) drug therapy: Secondary | ICD-10-CM | POA: Diagnosis not present

## 2023-09-10 DIAGNOSIS — Z681 Body mass index (BMI) 19 or less, adult: Secondary | ICD-10-CM | POA: Diagnosis not present

## 2023-09-10 DIAGNOSIS — M0609 Rheumatoid arthritis without rheumatoid factor, multiple sites: Secondary | ICD-10-CM | POA: Diagnosis not present

## 2023-09-10 DIAGNOSIS — K529 Noninfective gastroenteritis and colitis, unspecified: Secondary | ICD-10-CM | POA: Diagnosis not present

## 2023-09-10 DIAGNOSIS — M459 Ankylosing spondylitis of unspecified sites in spine: Secondary | ICD-10-CM | POA: Diagnosis not present

## 2023-09-12 ENCOUNTER — Encounter (HOSPITAL_BASED_OUTPATIENT_CLINIC_OR_DEPARTMENT_OTHER): Payer: PPO | Admitting: Physician Assistant

## 2023-09-12 DIAGNOSIS — L97822 Non-pressure chronic ulcer of other part of left lower leg with fat layer exposed: Secondary | ICD-10-CM | POA: Diagnosis not present

## 2023-09-17 NOTE — Progress Notes (Signed)
[name], [name] ([id number]) [id number] Page 1 of 5 Visit Report for 09/12/2023 Arrival Information Details Patient Name: Date of Service: [name], [name] 09/12/2023 11:00 A M Medical Record Number: [id number] Patient Account Number: [id number] Date of Birth/Sex: Treating RN: 20-Jun-1933 (89 y.o. [name] Primary Care [name]: [name] Other Clinician: Referring [name]: Treating [name]/Extender: [name] in Treatment: 16 Visit Information History Since Last Visit Added or deleted any medications: No Patient Arrived: Ambulatory Any new allergies or adverse reactions: No Arrival Time: 10:51 Had a fall or experienced change in No Accompanied By: self activities of daily living that may affect Transfer Assistance: None risk of falls: Patient Identification Verified: Yes Signs or symptoms of abuse/neglect since last visito No Patient Requires Transmission-Based Precautions: No Hospitalized since last visit: No Patient Has Alerts: No Implantable device outside of the clinic excluding No cellular tissue based products placed in the center since last visit: Pain Present Now: No Electronic Signature(s) Signed: 09/17/2023 12:28:15 PM By: Karie Schwalbe RN Entered By: Karie Schwalbe on 09/12/2023 07:52:08 -------------------------------------------------------------------------------- Clinic Level of Care Assessment Details Patient Name: Date of Service: [name], [name] 09/12/2023 11:00 A M Medical Record Number: [id number] Patient Account Number: [id number] Date of Birth/Sex: Treating RN: July 20, 1933 (89 y.o. [name] Primary Care [name]: [name] Other Clinician: Referring [name]: Treating [name]/Extender: [name] in Treatment: 16 Clinic Level of Care Assessment Items TOOL 4 Quantity Score X- 1 0 Use when only an EandM is performed on FOLLOW-UP  visit ASSESSMENTS - Nursing Assessment / Reassessment X- 1 10 Reassessment of Co-morbidities (includes updates in patient status) X- 1 5 Reassessment of Adherence to Treatment Plan ASSESSMENTS - Wound and Skin A ssessment / Reassessment []  - 0 Simple Wound Assessment / Reassessment - one wound []  - 0 Complex Wound Assessment / Reassessment - multiple wounds []  - 0 Dermatologic / Skin Assessment (not related to wound area) ASSESSMENTS - Focused Assessment []  - 0 Circumferential Edema Measurements - multi extremities []  - 0 Nutritional Assessment / Counseling / Intervention []  - 0 Lower Extremity Assessment (monofilament, tuning fork, pulses) [name], [name] ([id number]) [id number] Page 2 of 5 []  - 0 Peripheral Arterial Disease Assessment (using hand held doppler) ASSESSMENTS - Ostomy and/or Continence Assessment and Care []  - 0 Incontinence Assessment and Management []  - 0 Ostomy Care Assessment and Management (repouching, etc.) PROCESS - Coordination of Care X - Simple Patient / Family Education for ongoing care 1 15 []  - 0 Complex (extensive) Patient / Family Education for ongoing care X- 1 10 Staff obtains Chiropractor, Records, T Results / Process Orders est X- 1 10 Staff telephones HHA, Nursing Homes / Clarify orders / etc []  - 0 Routine Transfer to another Facility (non-emergent condition) []  - 0 Routine Hospital Admission (non-emergent condition) []  - 0 New Admissions / Manufacturing engineer / Ordering NPWT Apligraf, etc. , []  - 0 Emergency Hospital Admission (emergent condition) X- 1 10 Simple Discharge Coordination []  - 0 Complex (extensive) Discharge Coordination PROCESS - Special Needs []  - 0 Pediatric / Minor Patient Management []  - 0 Isolation Patient Management []  - 0 Hearing / Language / Visual special needs []  - 0 Assessment of Community assistance (transportation, D/C planning, etc.) []  - 0 Additional assistance /  Altered mentation []  - 0 Support Surface(s) Assessment (bed, cushion, seat, etc.) INTERVENTIONS - Wound Cleansing / Measurement X - Simple Wound Cleansing - one wound 1 5 []  -  0 Complex Wound Cleansing - multiple wounds []  - 0 Wound Imaging (photographs - any number of wounds) []  - 0 Wound Tracing (instead of photographs) []  - 0 Simple Wound Measurement - one wound []  - 0 Complex Wound Measurement - multiple wounds INTERVENTIONS - Wound Dressings X - Small Wound Dressing one or multiple wounds 1 10 []  - 0 Medium Wound Dressing one or multiple wounds []  - 0 Large Wound Dressing one or multiple wounds []  - 0 Application of Medications - topical []  - 0 Application of Medications - injection INTERVENTIONS - Miscellaneous []  - 0 External ear exam []  - 0 Specimen Collection (cultures, biopsies, blood, body fluids, etc.) []  - 0 Specimen(s) / Culture(s) sent or taken to Lab for analysis []  - 0 Patient Transfer (multiple staff / Nurse, adult / Similar devices) []  - 0 Simple Staple / Suture removal (25 or less) []  - 0 Complex Staple / Suture removal (26 or more) []  - 0 Hypo / Hyperglycemic Management (close monitor of Blood Glucose) []  - 0 Ankle / Brachial Index (ABI) - do not check if billed separately [name], [name] ([id number]) [id number] Page 3 of 5 []  - 0 Vital Signs Has the patient been seen at the hospital within the last three years: Yes Total Score: 75 Level Of Care: New/Established - Level 2 Electronic Signature(s) Signed: 09/17/2023 12:28:15 PM By: Karie Schwalbe RN Entered By: Karie Schwalbe on 09/12/2023 07:54:58 -------------------------------------------------------------------------------- Encounter Discharge Information Details Patient Name: Date of Service: [name], [name] 09/12/2023 11:00 A M Medical Record Number: [id number] Patient Account Number: [id number] Date of Birth/Sex: Treating RN: 1932/12/07 (89 y.o. [name] Primary Care Betsy Rosello: [name] Other Clinician: Referring Arlo Buffone: Treating Wynonna Fitzhenry/Extender: [name] in Treatment: 16 Encounter Discharge Information Items Discharge Condition: Stable Ambulatory Status: Ambulatory Discharge Destination: Home Transportation: Private Auto Accompanied By: self Schedule Follow-up Appointment: Yes Clinical Summary of Care: Patient Declined Electronic Signature(s) Signed: 09/17/2023 12:28:15 PM By: Karie Schwalbe RN Entered By: Karie Schwalbe on 09/12/2023 07:53:58 -------------------------------------------------------------------------------- Patient/Caregiver Education Details Patient Name: Date of Service: [name] 11/27/2024andnbsp11:00 A M Medical Record Number: [id number] Patient Account Number: [id number] Date of Birth/Gender: Treating RN: 08/13/1933 (89 y.o. [name] Primary Care Physician: [name] Other Clinician: Referring Physician: Treating Physician/Extender: [name] in Treatment: 16 Education Assessment Education Provided To: Patient Education Topics Provided Wound/Skin Impairment: Methods: Explain/Verbal Responses: State content correctly [name]) Signed: 09/17/2023 12:28:15 PM By: Karie Schwalbe RN Entered By: Karie Schwalbe on 09/12/2023 07:53:39 [name] ([id number]) [id number] Page 4 of 5 -------------------------------------------------------------------------------- Wound Assessment Details Patient Name: Date of Service: Merlin, [name] 09/12/2023 11:00 A M Medical Record Number: [id number] Patient Account Number: [id number] Date of Birth/Sex: Treating RN: [birth year] (89 y.o. [name] Primary Care Ceci Taliaferro: [name] Other Clinician: Referring Lakin Rhine: Treating Alexei Doswell/Extender: [name] in Treatment: 16 Wound  Status Wound Number: 10 Primary Pressure Ulcer Etiology: Wound Location: Right, Lateral Foot Wound Status: Open Wounding Event: Gradually Appeared Comorbid Cataracts, Hypertension, Raynauds, Rheumatoid Arthritis, Date Acquired: 06/19/2023 History: Osteoarthritis Weeks Of Treatment: 9 Clustered Wound: No Wound Measurements Length: (cm) 0.7 Width: (cm) 0.8 Depth: (cm) 0.3 Area: (cm) 0.44 Volume: (cm) 0.132 % Reduction in Area: -33.3% % Reduction in Volume: -100% Epithelialization: Small (1-33%) Tunneling: No Undermining: No Wound Description Classification: Category/Stage III Wound Margin: Distinct, outline attached Exudate Amount: Medium Exudate Type: Serosanguineous Exudate  Color: red, brown Foul Odor After Cleansing: No Slough/Fibrino Yes Wound Bed Granulation Amount: Small (1-33%) Exposed Structure Granulation Quality: Pink, Pale Fascia Exposed: No Necrotic Amount: Large (67-100%) Fat Layer (Subcutaneous Tissue) Exposed: Yes Necrotic Quality: Adherent Slough Tendon Exposed: No Muscle Exposed: No Joint Exposed: No Bone Exposed: No Periwound Skin Texture Texture Color No Abnormalities Noted: No No Abnormalities Noted: No Callus: No Atrophie Blanche: No Crepitus: No Cyanosis: No Excoriation: No Ecchymosis: No Induration: No Erythema: No Rash: No Hemosiderin Staining: No Scarring: No Mottled: No Pallor: No Moisture Rubor: No No Abnormalities Noted: No Dry / Scaly: Yes Temperature / Pain Maceration: No Temperature: No Abnormality Treatment Notes Wound #10 (Foot) Wound Laterality: Right, Lateral Cleanser Soap and Water Discharge Instruction: May shower and wash wound with dial antibacterial soap and water prior to dressing change. Peri-Wound Care Sween Lotion (Moisturizing lotion) Discharge Instruction: Apply moisturizing lotion as directed [name], [name] ([id number]) [id number] Page 5 of 5 Topical Primary Dressing Endoform  2x2 in Discharge Instruction: Moisten with saline place into wound bed Secondary Dressing Optifoam Non-Adhesive Dressing, 4x4 in Discharge Instruction: Apply over primary dressing x1 layer foam donut. Woven Gauze Sponges 2x2 in Discharge Instruction: Apply over primary dressing as directed. Zetuvit Plus Silicone Border Dressing 4x4 (in/in) Discharge Instruction: Apply silicone border over primary dressing as directed. Secured With Compression Wrap Compression Stockings Facilities manager) Signed: 09/17/2023 12:28:15 PM By: Karie Schwalbe RN Entered By: Karie Schwalbe on 09/12/2023 07:52:56 -------------------------------------------------------------------------------- Vitals Details Patient Name: Date of Service: [place], [name] 09/12/2023 11:00 A M Medical Record Number: [id number] Patient Account Number: [id number] Date of Birth/Sex: Treating RN: Apr 29, 1933 (89 y.o. [name] Primary Care Brytney Somes: [name] Other Clinician: Referring Kaicee Scarpino: Treating Katy Brickell/Extender: [name] in Treatment: 16 Vital Signs Time Taken: 10:51 Respiratory Rate (breaths/min): 16 Height (in): 62 Reference Range: 80 - 120 mg / dl Weight (lbs): 130 Body Mass Index (BMI): 19.2 Electronic Signature(s) Signed: 09/17/2023 12:28:15 PM By: Karie Schwalbe RN Entered By: Karie Schwalbe on 09/12/2023 07:52:23

## 2023-09-19 ENCOUNTER — Encounter (HOSPITAL_BASED_OUTPATIENT_CLINIC_OR_DEPARTMENT_OTHER): Payer: PPO | Attending: Internal Medicine | Admitting: Internal Medicine

## 2023-09-19 DIAGNOSIS — L84 Corns and callosities: Secondary | ICD-10-CM | POA: Diagnosis not present

## 2023-09-19 DIAGNOSIS — H905 Unspecified sensorineural hearing loss: Secondary | ICD-10-CM | POA: Diagnosis not present

## 2023-09-19 DIAGNOSIS — L89893 Pressure ulcer of other site, stage 3: Secondary | ICD-10-CM | POA: Insufficient documentation

## 2023-09-19 DIAGNOSIS — I10 Essential (primary) hypertension: Secondary | ICD-10-CM | POA: Insufficient documentation

## 2023-09-19 DIAGNOSIS — M7061 Trochanteric bursitis, right hip: Secondary | ICD-10-CM | POA: Diagnosis not present

## 2023-09-21 NOTE — Progress Notes (Signed)
CHELSEAMARIE, HOLLINGHEAD ([id number]) [id number] Page 1 of 7 Visit Report for 09/19/2023 HPI Details Patient Name: Date of Service: [place], [name] 09/19/2023 2:00 PM Medical Record Number: [id number] Patient Account Number: [id number] Date of Birth/Sex: Treating RN: 12-Jun-1933 (89 y.o. F) Primary Care Provider: [name] Other Clinician: Referring Provider: Treating Provider/Extender: Trevor Mace in Treatment: 17 History of Present Illness HPI Description: READMISSION 07/15/2019 Patient is now an 89 year old woman who was previously here in 2016 and 2018. Cared for at the time by Dr. Meyer Russel. Both times with wounds related to trauma in the left leg. She was felt to have underlying venous insufficiency although both occasions were related to trauma. The patient tells me that 2 weeks ago she hit her lateral left leg on the car door. This was a skin tear with a flap for a period of time she has been using Polysporin. The flap came off recently she has a clean looking superficial wound on the left lateral leg. Our intake nurse reported some drainage. Past medical history; includes sensorineural hearing loss, osteoarthritis, hypertension and a hammertoe on the right foot for which she follows with podiatry. ABIs in our clinic were 1.19 on the left 07/21/2019; the patient did not like the wraps. They slid down and rub the wound the wound is measuring larger she is upset. 10/12; left lateral leg wound. Most of this looks healthy even under illumination. We have been using Hydrofera Blue under border foam. She would not allow compression 10/19; left lateral leg wound. 2 small open areas remain of the original wound. We have been using Hydrofera Blue under border foam. This seems to be making decent progress 10/26 left lateral leg traumatic wound. There is no open wound remaining. We have been using Hydrofera Blue under border foam she arrived with  some denuded epithelium that I removed there is no open wound remaining. Is fairly clear she has some degree of chronic venous insufficiency with not a lot of edema but dilated veins in her feet. She reminds me that she also has reactive arthritis and was on prednisone for a prolonged period of time. Nevertheless I think it would be beneficial for her to at least wear support stockings but right now I do not think she is going to agree to the Readmission: 02/18/2020 upon evaluation today patient has sustained a skin tear which occurred about 1 week ago she tells me. Fortunately there does not appear to be any signs of active infection at this time which is excellent news. She has been tolerating the dressing changes without complication. Overall very pleased with where things stand at this point. The only issue that I see here is that the skin flap somewhat folded back and then reattached further down causing an area that is actually bunched up to form where it closed on itself but then reattached on the end of the tissue. Nonetheless I think we have to trim off the bunched up tissue in order to allow this to heal appropriately. That is the only issue I really see today. 03/10/2020 upon evaluation today patient actually appears to be doing excellent at this time. She has healed quite nicely and has been a couple of weeks since I saw her. Overall I feel like she is completely healed and ready for discharge as of today. Readmission: 03-21-2023 upon evaluation today patient appears to be doing somewhat poorly in regard to the left wound ulcer she tells me has been present  for about a month. She tells me that she had this on the metal flatbed shopping carts at [facility] and subsequently has been having issues here with this since she tells me that the original Band-Aid she was using caused this to get worse. With that being said I do not see any evidence of active infection at this time everything seems to  really be doing quite well as far as I am concerned. 03-28-2023 upon evaluation today patient appears to be doing well currently in regard to her wounds which in fact appear to be completely healed. I am very pleased with this. Readmission: 05-23-2023 upon evaluation patient presents for reevaluation here in the clinic concerning issues that she has been having with her left anterior lower extremity. This actually appears to have a significant amount of new skin growth over top of the areas of irritation I am not exactly sure what happened here to be perfectly honest. I explained to the patient that this is kind of an unusual presentation for this to be this irritated and yet not have any significant openings at this time. I do not see any evidence of active infection locally or systemically which is great news. 05-30-2023 upon evaluation today patient appears to be doing well currently in regard to her lower extremity wounds which are actually drying out the may be a little bit too dry. I think PolyMem may be better for her and we discussed that today. Fortunately I do not see any signs of active infection locally or systemically which is great news. 06-06-2023 upon evaluation today patient appears to be doing well currently in regard to her wounds. Both are showing signs of improvement which is great news. Fortunately I do not see any signs of active infection locally or systemically at this time. 8/28; the distal wound on the left leg is healed she still has a comma shaped proximal wound medially. She reminds as she will be leaving for [place] on September night. The remaining wound is on the right upper medial calf a comma shaped wound this may be closed by that time. We had some discussion about compression stockings 06-20-2023 upon evaluation today patient appears to be doing well currently in regard to her wounds in fact most everything appears to be about healed although she does have some dry skin  over top of the distal left lateral leg. The proximal medial leg is very close to closure and seems to be doing better. 07-11-2023 upon evaluation today patient appears to be doing well currently in regard to her wound. She has been tolerating the dressing changes without SHIANA, GROSSE ([id number]) [phone].pdf Page 2 of 7 complication. Fortunately there does not appear to be any signs of infection locally or systemically which is great news. Unfortunately she has been having issues with colitis and she appears to be very pale today she also tells me that she has been feeling fairly poorly. Her rheumatologist to put her on prednisone she is also been put on antibiotics for the colitis. Her wounds on the left anterior lower extremity have reinitiated a little bit here and I am beginning to suspect this may be more autoimmune related. 10-to-24 upon evaluation today patient appears to be doing well currently at this point with regard to her wound. She has been tolerating the dressing changes without complication. Fortunately I do not see any signs of worsening overall I believe that the patient is making good headway towards complete closure which is great news.  Still have a little concerned about the possibility of her having some issues here with a rheumatologic or dermatologic issue. I do believe that the steroid ointment has done a little bit better for her the triamcinolone I prescribed last week. 07-25-2023 upon evaluation today patient appears to be doing really about the same in regard to her wounds. The steroid ointment does not seem to make in a big difference unfortunately. Fortunately I do not see any signs of active infection locally or systemically which is good news. With that being said she does have 2 new pustules that have shown up she states they started looking like pimples initially and then have gotten worse since. With that being said I do believe that we  need to see what we can do to try to get this under control she does have an appointment with dermatology next week in the meantime I am actually see about getting her started with a antibiotic ointment instead of the steroid. 08-01-2023 upon evaluation today patient appears to be doing well currently in regard to her left leg which is actually showing signs of improvement the antibiotic ointment actually seems to be doing quite well. I am actually very pleased with where we stand currently. Fortunately I do not see any signs of active infection locally or systemically at this time. No fevers, chills, nausea, vomiting, or diarrhea. 08-08-2023 upon evaluation today patient appears to be doing actually pretty well in regard to her wound compared to where we were previous. I do think that the erythema has improved the wound size is getting a little bit smaller as I see her each week and in general I think that we are on the right track. There is no signs of significant infection although there is erythema I really feel like this may be more pressure related as it is blanchable as opposed to infection. Nonetheless I been leery to go forward with doing an oral antibiotic due to the history with the GI upset that she had more recently where she got very sick. Nonetheless if she is not significantly improved come next week we may need to consider going forward with the oral antibiotics just to be on the safe side for now we will continue topical mupirocin. 08-15-2023 upon evaluation today patient appears to be doing well currently in regard to her wound. She is actually showing signs of good improvement she does have some slough and biofilm noted we will get a going perform some debridement to clear away the slough and biofilm down to good subcutaneous tissue my hope is this will help to get the area to heal much more effectively and quickly with the collagen. 11/6; patient has an open wound at the base of  her fifth metatarsal on the right lateral foot. Small punched-out area with a nonviable surface some erythema around the wound which almost looks like a chronic bursitis rather than infection. She has previously had wounds on the left leg which are apparently healed 11/13; a wound at the base of her fifth metatarsal on the right lateral foot. This may actually represent a bunion deformity. Last week she had a punched-out area with nonviable surface I removed with a curette. We have been using Iodoflex to clean up the wound bed. She said this hurt for 2 or 3 days but it is settled down now. She is changing the dressing herself. 11/20; fifth metatarsal base at the right lateral foot. I think this is a chronic bursitis type  deformity/bunion deformity. We used Iodoflex up until now. Hope we will be changed to endoform today. No debridement. 12/3; right fifth metatarsal base. On almost a bunion like injury. Surrounding callus in this area in the middle of which is a open wound. We had been using Iodoflex however that this caused irritation so we changed to endoform. The wound looks better today there is no surrounding infection. We were considering going to an advanced tissue product either new shield or Puraply. Electronic Signature(s) Signed: 09/20/2023 4:45:28 AM By: Baltazar Najjar MD Entered By: Baltazar Najjar on 09/19/2023 14:59:28 -------------------------------------------------------------------------------- Physical Exam Details Patient Name: Date of Service: [place], [name] 09/19/2023 2:00 PM Medical Record Number: [id number] Patient Account Number: [id number] Date of Birth/Sex: Treating RN: December 12, 1932 (89 y.o. F) Primary Care Provider: [name] Other Clinician: Referring Provider: Treating Provider/Extender: Trevor Mace in Treatment: 17 Notes Wound exam; the area questions on the base of the right fifth metatarsal. This looks somewhat protruded  protuberant. I think this is a chronic bursitis in this area from chronic friction. No debridement is required. There is a small area that is clearly not fully epithelialized in the middle of this what is surrounding it may be epithelialized or may be fibrinous slough I am not certain I did not debride this I am going to elect to follow this carefully next week. Electronic Signature(s) Signed: 09/20/2023 4:45:28 AM By: Baltazar Najjar MD Entered By: Baltazar Najjar on 09/19/2023 15:01:37 Jenna Luo ([id number]) [id number] Page 3 of 7 -------------------------------------------------------------------------------- Physician Orders Details Patient Name: Date of Service: [place], [name] 09/19/2023 2:00 PM Medical Record Number: [id number] Patient Account Number: [id number] Date of Birth/Sex: Treating RN: [birth year] (89 y.o. Arta Silence Primary Care Provider: [name] Other Clinician: Referring Provider: Treating Provider/Extender: Trevor Mace in Treatment: 17 The following information was scribed by: Shawn Stall The information was scribed for: Baltazar Najjar Verbal / Phone Orders: No Diagnosis Coding Follow-up Appointments ppointment in 1 week. - Wednesday Dr. Leanord Hawking (already scheduled) 09/26/2023 130pm Return A ppointment in 2 weeks. - Wednesday Dr. Leanord Hawking (already scheduled) 10/03/2023 1245pm Return A Return appointment in 3 weeks. - Wednesday Dr. Leanord Hawking (already scheduled) 10/09/2023 0930am Anesthetic (In clinic) Topical Lidocaine 4% applied to wound bed Cellular or Tissue Based Products Other Cellular or Tissue Based Products Orders/Instructions: - insurance auth for Nushield from [employer] May shower and wash wound with soap and water. Edema Control - Orders / Instructions Elevate legs to the level of the heart or above for 30 minutes daily and/or when sitting for 3-4 times a  day throughout the day. Avoid standing for long periods of time. Wound Treatment Wound #10 - Foot Wound Laterality: Right, Lateral Cleanser: Soap and Water Every Other Day/30 Days Discharge Instructions: May shower and wash wound with dial antibacterial soap and water prior to dressing change. Peri-Wound Care: Sween Lotion (Moisturizing lotion) Every Other Day/30 Days Discharge Instructions: Apply moisturizing lotion as directed Prim Dressing: Endoform 2x2 in (Generic) Every Other Day/30 Days ary Discharge Instructions: Moisten with saline place into wound bed Secondary Dressing: Optifoam Non-Adhesive Dressing, 4x4 in Every Other Day/30 Days Discharge Instructions: Apply over primary dressing x1 layer foam donut. Secondary Dressing: Woven Gauze Sponges 2x2 in (Generic) Every Other Day/30 Days Discharge Instructions: Apply over primary dressing as directed. Secondary Dressing: Zetuvit Plus Silicone Border Dressing 4x4 (in/in) (Generic) Every Other Day/30 Days Discharge Instructions: Apply silicone border over primary dressing  as directed. Electronic Signature(s) Signed: 09/20/2023 4:45:28 AM By: Baltazar Najjar MD Signed: 09/20/2023 4:52:59 PM By: Shawn Stall RN, BSN Entered By: Shawn Stall on 09/19/2023 14:33:35 -------------------------------------------------------------------------------- Problem List Details Patient Name: Date of Service: [place], [name] 09/19/2023 2:00 PM Jenna Luo ([id number]) [phone].pdf Page 4 of 7 Medical Record Number: [id number] Patient Account Number: [id number] Date of Birth/Sex: Treating RN: [birth year] (89 y.o. F) Primary Care Provider: [name] Other Clinician: Referring Provider: Treating Provider/Extender: Trevor Mace in Treatment: 17 Active Problems ICD-10 Encounter Code Description Active Date MDM Diagnosis L89.893 Pressure ulcer of other site, stage 3 07/11/2023 No Yes I10  Essential (primary) hypertension 05/23/2023 No Yes Inactive Problems ICD-10 Code Description Active Date Inactive Date I87.332 Chronic venous hypertension (idiopathic) with ulcer and inflammation of left lower 05/23/2023 05/23/2023 extremity L97.822 Non-pressure chronic ulcer of other part of left lower leg with fat layer exposed 05/23/2023 05/23/2023 D63.875 Non-pressure chronic ulcer of other part of left lower leg with fat layer exposed 05/23/2023 05/23/2023 Resolved Problems Electronic Signature(s) Signed: 09/20/2023 4:45:28 AM By: Baltazar Najjar MD Entered By: Baltazar Najjar on 09/19/2023 14:58:12 -------------------------------------------------------------------------------- Progress Note Details Patient Name: Date of Service: [place], [name] 09/19/2023 2:00 PM Medical Record Number: [id number] Patient Account Number: [id number] Date of Birth/Sex: Treating RN: 15-Oct-1933 (89 y.o. F) Primary Care Provider: [name] Other Clinician: Referring Provider: Treating Provider/Extender: Trevor Mace in Treatment: 17 Subjective History of Present Illness (HPI) READMISSION 07/15/2019 Patient is now an 89 year old woman who was previously here in 2016 and 2018. Cared for at the time by Dr. Meyer Russel. Both times with wounds related to trauma in the left leg. She was felt to have underlying venous insufficiency although both occasions were related to trauma. The patient tells me that 2 weeks ago she hit her lateral left leg on the car door. This was a skin tear with a flap for a period of time she has been using Polysporin. The flap came off recently she has a clean looking superficial wound on the left lateral leg. Our intake nurse reported some drainage. Past medical history; includes sensorineural hearing loss, osteoarthritis, hypertension and a hammertoe on the right foot for which she follows with podiatry. ABIs in our clinic were 1.19 on the left 07/21/2019; the  patient did not like the wraps. They slid down and rub the wound the wound is measuring larger she is upset. 10/12; left lateral leg wound. Most of this looks healthy even under illumination. We have been using Hydrofera Blue under border foam. She would not allow compression MARIALY, MCGARRITY L ([id number]) [phone].pdf Page 5 of 7 10/19; left lateral leg wound. 2 small open areas remain of the original wound. We have been using Hydrofera Blue under border foam. This seems to be making decent progress 10/26 left lateral leg traumatic wound. There is no open wound remaining. We have been using Hydrofera Blue under border foam she arrived with some denuded epithelium that I removed there is no open wound remaining. Is fairly clear she has some degree of chronic venous insufficiency with not a lot of edema but dilated veins in her feet. She reminds me that she also has reactive arthritis and was on prednisone for a prolonged period of time. Nevertheless I think it would be beneficial for her to at least wear support stockings but right now I do not think she is going to agree to the Readmission: 02/18/2020 upon evaluation today patient  has sustained a skin tear which occurred about 1 week ago she tells me. Fortunately there does not appear to be any signs of active infection at this time which is excellent news. She has been tolerating the dressing changes without complication. Overall very pleased with where things stand at this point. The only issue that I see here is that the skin flap somewhat folded back and then reattached further down causing an area that is actually bunched up to form where it closed on itself but then reattached on the end of the tissue. Nonetheless I think we have to trim off the bunched up tissue in order to allow this to heal appropriately. That is the only issue I really see today. 03/10/2020 upon evaluation today patient actually appears to be doing  excellent at this time. She has healed quite nicely and has been a couple of weeks since I saw her. Overall I feel like she is completely healed and ready for discharge as of today. Readmission: 03-21-2023 upon evaluation today patient appears to be doing somewhat poorly in regard to the left wound ulcer she tells me has been present for about a month. She tells me that she had this on the metal flatbed shopping carts at [facility] and subsequently has been having issues here with this since she tells me that the original Band-Aid she was using caused this to get worse. With that being said I do not see any evidence of active infection at this time everything seems to really be doing quite well as far as I am concerned. 03-28-2023 upon evaluation today patient appears to be doing well currently in regard to her wounds which in fact appear to be completely healed. I am very pleased with this. Readmission: 05-23-2023 upon evaluation patient presents for reevaluation here in the clinic concerning issues that she has been having with her left anterior lower extremity. This actually appears to have a significant amount of new skin growth over top of the areas of irritation I am not exactly sure what happened here to be perfectly honest. I explained to the patient that this is kind of an unusual presentation for this to be this irritated and yet not have any significant openings at this time. I do not see any evidence of active infection locally or systemically which is great news. 05-30-2023 upon evaluation today patient appears to be doing well currently in regard to her lower extremity wounds which are actually drying out the may be a little bit too dry. I think PolyMem may be better for her and we discussed that today. Fortunately I do not see any signs of active infection locally or systemically which is great news. 06-06-2023 upon evaluation today patient appears to be doing well currently in regard to her  wounds. Both are showing signs of improvement which is great news. Fortunately I do not see any signs of active infection locally or systemically at this time. 8/28; the distal wound on the left leg is healed she still has a comma shaped proximal wound medially. She reminds as she will be leaving for [place] on September night. The remaining wound is on the right upper medial calf a comma shaped wound this may be closed by that time. We had some discussion about compression stockings 06-20-2023 upon evaluation today patient appears to be doing well currently in regard to her wounds in fact most everything appears to be about healed although she does have some dry skin over top of the  distal left lateral leg. The proximal medial leg is very close to closure and seems to be doing better. 07-11-2023 upon evaluation today patient appears to be doing well currently in regard to her wound. She has been tolerating the dressing changes without complication. Fortunately there does not appear to be any signs of infection locally or systemically which is great news. Unfortunately she has been having issues with colitis and she appears to be very pale today she also tells me that she has been feeling fairly poorly. Her rheumatologist to put her on prednisone she is also been put on antibiotics for the colitis. Her wounds on the left anterior lower extremity have reinitiated a little bit here and I am beginning to suspect this may be more autoimmune related. 10-to-24 upon evaluation today patient appears to be doing well currently at this point with regard to her wound. She has been tolerating the dressing changes without complication. Fortunately I do not see any signs of worsening overall I believe that the patient is making good headway towards complete closure which is great news. Still have a little concerned about the possibility of her having some issues here with a rheumatologic or dermatologic issue. I do  believe that the steroid ointment has done a little bit better for her the triamcinolone I prescribed last week. 07-25-2023 upon evaluation today patient appears to be doing really about the same in regard to her wounds. The steroid ointment does not seem to make in a big difference unfortunately. Fortunately I do not see any signs of active infection locally or systemically which is good news. With that being said she does have 2 new pustules that have shown up she states they started looking like pimples initially and then have gotten worse since. With that being said I do believe that we need to see what we can do to try to get this under control she does have an appointment with dermatology next week in the meantime I am actually see about getting her started with a antibiotic ointment instead of the steroid. 08-01-2023 upon evaluation today patient appears to be doing well currently in regard to her left leg which is actually showing signs of improvement the antibiotic ointment actually seems to be doing quite well. I am actually very pleased with where we stand currently. Fortunately I do not see any signs of active infection locally or systemically at this time. No fevers, chills, nausea, vomiting, or diarrhea. 08-08-2023 upon evaluation today patient appears to be doing actually pretty well in regard to her wound compared to where we were previous. I do think that the erythema has improved the wound size is getting a little bit smaller as I see her each week and in general I think that we are on the right track. There is no signs of significant infection although there is erythema I really feel like this may be more pressure related as it is blanchable as opposed to infection. Nonetheless I been leery to go forward with doing an oral antibiotic due to the history with the GI upset that she had more recently where she got very sick. Nonetheless if she is not significantly improved come next week  we may need to consider going forward with the oral antibiotics just to be on the safe side for now we will continue topical mupirocin. 08-15-2023 upon evaluation today patient appears to be doing well currently in regard to her wound. She is actually showing signs of good improvement she  does have some slough and biofilm noted we will get a going perform some debridement to clear away the slough and biofilm down to good subcutaneous tissue my hope is this will help to get the area to heal much more effectively and quickly with the collagen. 11/6; patient has an open wound at the base of her fifth metatarsal on the right lateral foot. Small punched-out area with a nonviable surface some erythema around the wound which almost looks like a chronic bursitis rather than infection. She has previously had wounds on the left leg which are apparently healed 11/13; a wound at the base of her fifth metatarsal on the right lateral foot. This may actually represent a bunion deformity. Last week she had a punched-out area with nonviable surface I removed with a curette. We have been using Iodoflex to clean up the wound bed. She said this hurt for 2 or 3 days but it is settled down now. She is changing the dressing herself. 11/20; fifth metatarsal base at the right lateral foot. I think this is a chronic bursitis type deformity/bunion deformity. We used Iodoflex up until now. Hope we will be changed to endoform today. No debridement. 12/3; right fifth metatarsal base. On almost a bunion like injury. Surrounding callus in this area in the middle of which is a open wound. We had been using ROGINA, PAQUE ([id number]) [id number] Page 6 of 7 Iodoflex however that this caused irritation so we changed to endoform. The wound looks better today there is no surrounding infection. We were considering going to an advanced tissue product either new shield or  Puraply. Objective Constitutional Vitals Time Taken: 2:04 PM, Height: 62 in, Weight: 105 lbs, BMI: 19.2, Temperature: 97.7 F, Pulse: 56 bpm, Respiratory Rate: 18 breaths/min, Blood Pressure: 159/73 mmHg. Integumentary (Hair, Skin) Wound #10 status is Open. Original cause of wound was Gradually Appeared. The date acquired was: 06/19/2023. The wound has been in treatment 10 weeks. The wound is located on the Right,Lateral Foot. The wound measures 0.7cm length x 0.4cm width x 0.2cm depth; 0.22cm^2 area and 0.044cm^3 volume. There is Fat Layer (Subcutaneous Tissue) exposed. There is no tunneling or undermining noted. There is a medium amount of serosanguineous drainage noted. The wound margin is distinct with the outline attached to the wound base. There is small (1-33%) pink, pale granulation within the wound bed. There is a large (67- 100%) amount of necrotic tissue within the wound bed including Adherent Slough. The periwound skin appearance exhibited: Dry/Scaly. The periwound skin appearance did not exhibit: Callus, Crepitus, Excoriation, Induration, Rash, Scarring, Maceration, Atrophie Blanche, Cyanosis, Ecchymosis, Hemosiderin Staining, Mottled, Pallor, Rubor, Erythema. Periwound temperature was noted as No Abnormality. Assessment Active Problems ICD-10 Pressure ulcer of other site, stage 3 Essential (primary) hypertension Plan Follow-up Appointments: Return Appointment in 1 week. - Wednesday Dr. Leanord Hawking (already scheduled) 09/26/2023 130pm Return Appointment in 2 weeks. - Wednesday Dr. Leanord Hawking (already scheduled) 10/03/2023 1245pm Return appointment in 3 weeks. - Wednesday Dr. Leanord Hawking (already scheduled) 10/09/2023 0930am Anesthetic: (In clinic) Topical Lidocaine 4% applied to wound bed Cellular or Tissue Based Products: Other Cellular or Tissue Based Products Orders/Instructions: - insurance auth for Nushield from [employer]: May shower and wash wound with soap  and water. Edema Control - Orders / Instructions: Elevate legs to the level of the heart or above for 30 minutes daily and/or when sitting for 3-4 times a day throughout the day. Avoid standing for long periods of time. WOUND #10: - Foot  Wound Laterality: Right, Lateral Cleanser: Soap and Water Every Other Day/30 Days Discharge Instructions: May shower and wash wound with dial antibacterial soap and water prior to dressing change. Peri-Wound Care: Sween Lotion (Moisturizing lotion) Every Other Day/30 Days Discharge Instructions: Apply moisturizing lotion as directed Prim Dressing: Endoform 2x2 in (Generic) Every Other Day/30 Days ary Discharge Instructions: Moisten with saline place into wound bed Secondary Dressing: Optifoam Non-Adhesive Dressing, 4x4 in Every Other Day/30 Days Discharge Instructions: Apply over primary dressing x1 layer foam donut. Secondary Dressing: Woven Gauze Sponges 2x2 in (Generic) Every Other Day/30 Days Discharge Instructions: Apply over primary dressing as directed. Secondary Dressing: Zetuvit Plus Silicone Border Dressing 4x4 (in/in) (Generic) Every Other Day/30 Days Discharge Instructions: Apply silicone border over primary dressing as directed. 1. I am continuing with the Puraply, a foam doughnut and covering foam border. 2. I elected not to use an advanced treatment product as I am not really convinced that the endoform is not working. 3. No evidence of infection 4. I continues to think this is a chronic friction/bunion type deformity Electronic Signature(s) Signed: 09/20/2023 4:45:28 AM By: Baltazar Najjar MD Entered By: Baltazar Najjar on 09/19/2023 15:02:57 Jenna Luo ([id number]) [id number] Page 7 of 7 -------------------------------------------------------------------------------- SuperBill Details Patient Name: Date of Service: [place], [name] 09/19/2023 Medical Record Number: [id number] Patient Account Number:  [id number] Date of Birth/Sex: Treating RN: 1932/10/20 (89 y.o. Debara Pickett, Millard.Loa Primary Care Provider: [name] Other Clinician: Referring Provider: Treating Provider/Extender: Trevor Mace in Treatment: 17 Diagnosis Coding ICD-10 Codes Code Description [id number] Chronic venous hypertension (idiopathic) with ulcer and inflammation of left lower extremity L97.822 Non-pressure chronic ulcer of other part of left lower leg with fat layer exposed L89.893 Pressure ulcer of other site, stage 3 L97.822 Non-pressure chronic ulcer of other part of left lower leg with fat layer exposed I10 Essential (primary) hypertension Facility Procedures : CPT4 Code: [id number] Description: 99213 - WOUND CARE VISIT-LEV 3 EST PT Modifier: Quantity: 1 Physician Procedures : CPT4 Code Description Modifier [id number] 99213 - WC PHYS LEVEL 3 - EST PT ICD-10 Diagnosis Description L89.893 Pressure ulcer of other site, stage 3 Quantity: 1 Electronic Signature(s) Signed: 09/20/2023 4:45:28 AM By: Baltazar Najjar MD Entered By: Baltazar Najjar on 09/19/2023 15:03:25

## 2023-09-26 ENCOUNTER — Encounter (HOSPITAL_BASED_OUTPATIENT_CLINIC_OR_DEPARTMENT_OTHER): Payer: PPO | Admitting: Internal Medicine

## 2023-09-26 DIAGNOSIS — L89893 Pressure ulcer of other site, stage 3: Secondary | ICD-10-CM | POA: Diagnosis not present

## 2023-09-27 NOTE — Progress Notes (Signed)
[name] ([id number]) [id number] Page 1 of 9 Visit Report for 09/26/2023 Arrival Information Details Patient Name: Date of Service: [name], [name] 09/26/2023 1:30 PM Medical Record Number: [id number] Patient Account Number: [id number] Date of Birth/Sex: Treating RN: 10-18-1932 (89 y.o. F) Primary Care Taim Wurm: [name] Other Clinician: Referring Markeesha Char: Treating Breeonna Mone/Extender: Trevor Mace in Treatment: 18 Visit Information History Since Last Visit Added or deleted any medications: No Patient Arrived: Ambulatory Any new allergies or adverse reactions: No Arrival Time: 13:28 Had a fall or experienced change in No Accompanied By: self activities of daily living that may affect Transfer Assistance: None risk of falls: Patient Identification Verified: Yes Signs or symptoms of abuse/neglect since last visito No Secondary Verification Process Completed: Yes Hospitalized since last visit: No Patient Requires Transmission-Based Precautions: No Implantable device outside of the clinic excluding No Patient Has Alerts: No cellular tissue based products placed in the center since last visit: Has Dressing in Place as Prescribed: Yes Pain Present Now: No Electronic Signature(s) Signed: 09/26/2023 5:04:55 PM By: Zenaida Deed RN, BSN Previous Signature: 09/26/2023 1:46:11 PM Version By: Dayton Scrape Entered By: Zenaida Deed on 09/26/2023 10:55:36 -------------------------------------------------------------------------------- Clinic Level of Care Assessment Details Patient Name: Date of Service: [name] 09/26/2023 1:30 PM Medical Record Number: [id number] Patient Account Number: [id number] Date of Birth/Sex: Treating RN: January 26, 1933 (89 y.o. [name] Primary Care Orrin Yurkovich: [name] Other Clinician: Referring Kyrus Hyde: Treating Nari Vannatter/Extender: Trevor Mace  in Treatment: 18 Clinic Level of Care Assessment Items TOOL 4 Quantity Score []  - 0 Use when only an EandM is performed on FOLLOW-UP visit ASSESSMENTS - Nursing Assessment / Reassessment X- 1 10 Reassessment of Co-morbidities (includes updates in patient status) X- 1 5 Reassessment of Adherence to Treatment Plan ASSESSMENTS - Wound and Skin A ssessment / Reassessment X - Simple Wound Assessment / Reassessment - one wound 1 5 []  - 0 Complex Wound Assessment / Reassessment - multiple wounds []  - 0 Dermatologic / Skin Assessment (not related to wound area) ASSESSMENTS - Focused Assessment []  - 0 Circumferential Edema Measurements - multi extremities []  - 0 Nutritional Assessment / Counseling / Intervention [name], [name] ([id number]) [id number] Page 2 of 9 X- 1 5 Lower Extremity Assessment (monofilament, tuning fork, pulses) []  - 0 Peripheral Arterial Disease Assessment (using hand held doppler) ASSESSMENTS - Ostomy and/or Continence Assessment and Care []  - 0 Incontinence Assessment and Management []  - 0 Ostomy Care Assessment and Management (repouching, etc.) PROCESS - Coordination of Care X - Simple Patient / Family Education for ongoing care 1 15 []  - 0 Complex (extensive) Patient / Family Education for ongoing care X- 1 10 Staff obtains Chiropractor, Records, T Results / Process Orders est []  - 0 Staff telephones HHA, Nursing Homes / Clarify orders / etc []  - 0 Routine Transfer to another Facility (non-emergent condition) []  - 0 Routine Hospital Admission (non-emergent condition) []  - 0 New Admissions / Manufacturing engineer / Ordering NPWT Apligraf, etc. , []  - 0 Emergency Hospital Admission (emergent condition) X- 1 10 Simple Discharge Coordination []  - 0 Complex (extensive) Discharge Coordination PROCESS - Special Needs []  - 0 Pediatric / Minor Patient Management []  - 0 Isolation Patient Management []  - 0 Hearing / Language /  Visual special needs []  - 0 Assessment of Community assistance (transportation, D/C planning, etc.) []  - 0 Additional assistance / Altered mentation []  - 0 Support Surface(s) Assessment (bed, cushion, seat, etc.)  INTERVENTIONS - Wound Cleansing / Measurement X - Simple Wound Cleansing - one wound 1 5 []  - 0 Complex Wound Cleansing - multiple wounds X- 1 5 Wound Imaging (photographs - any number of wounds) []  - 0 Wound Tracing (instead of photographs) X- 1 5 Simple Wound Measurement - one wound []  - 0 Complex Wound Measurement - multiple wounds INTERVENTIONS - Wound Dressings X - Small Wound Dressing one or multiple wounds 1 10 []  - 0 Medium Wound Dressing one or multiple wounds []  - 0 Large Wound Dressing one or multiple wounds []  - 0 Application of Medications - topical []  - 0 Application of Medications - injection INTERVENTIONS - Miscellaneous []  - 0 External ear exam []  - 0 Specimen Collection (cultures, biopsies, blood, body fluids, etc.) []  - 0 Specimen(s) / Culture(s) sent or taken to Lab for analysis []  - 0 Patient Transfer (multiple staff / Nurse, adult / Similar devices) []  - 0 Simple Staple / Suture removal (25 or less) []  - 0 Complex Staple / Suture removal (26 or more) []  - 0 Hypo / Hyperglycemic Management (close monitor of Blood Glucose) AASTHA, GUTMAN L ([id number]) [id number] Page 3 of 9 []  - 0 Ankle / Brachial Index (ABI) - do not check if billed separately X- 1 5 Vital Signs Has the patient been seen at the hospital within the last three years: Yes Total Score: 90 Level Of Care: New/Established - Level 3 Electronic Signature(s) Signed: 09/26/2023 5:04:55 PM By: Zenaida Deed RN, BSN Entered By: Zenaida Deed on 09/26/2023 11:13:35 -------------------------------------------------------------------------------- Encounter Discharge Information Details Patient Name: Date of Service: [name], [name] 09/26/2023 1:30  PM Medical Record Number: [id number] Patient Account Number: [id number] Date of Birth/Sex: Treating RN: 1933/02/26 (89 y.o. [name] Primary Care [name]: [name] Other Clinician: Referring [name]: Treating [name]/Extender: Trevor Mace in Treatment: 18 Encounter Discharge Information Items Discharge Condition: Stable Ambulatory Status: Ambulatory Discharge Destination: Home Transportation: Private Auto Accompanied By: self Schedule Follow-up Appointment: Yes Clinical Summary of Care: Patient Declined Electronic Signature(s) Signed: 09/26/2023 5:04:55 PM By: Zenaida Deed RN, BSN Entered By: Zenaida Deed on 09/26/2023 11:24:19 -------------------------------------------------------------------------------- Lower Extremity Assessment Details Patient Name: Date of Service: [place], [name] 09/26/2023 1:30 PM Medical Record Number: [id number] Patient Account Number: [id number] Date of Birth/Sex: Treating RN: 12/27/1932 (89 y.o. [name] Primary Care Dorothee Napierkowski: [name] Other Clinician: Referring [name]: Treating [name]/Extender: Trevor Mace in Treatment: 18 Edema Assessment Assessed: [Left: No] [Right: No] Edema: [Left: N] [Right: o] Calf Left: Right: Point of Measurement: From Medial Instep 29.8 cm Ankle Left: Right: Point of Measurement: From Medial Instep 17 cm Vascular Assessment EARNEST, ARMFIELD L ([id number]) [Right:[id number] Page 4 of 9] Pulses: Dorsalis Pedis Palpable: [Right:Yes] Extremity colors, hair growth, and conditions: Extremity Color: [Right:Hyperpigmented] Hair Growth on Extremity: [Right:No] Temperature of Extremity: [Right:Cold] Capillary Refill: [Right:< 3 seconds] Dependent Rubor: [Right:No No] Electronic Signature(s) Signed: 09/26/2023 5:04:55 PM By: Zenaida Deed RN, BSN Entered By: Zenaida Deed on 09/26/2023  10:57:19 -------------------------------------------------------------------------------- Multi Wound Chart Details Patient Name: Date of Service: [name], [name] 09/26/2023 1:30 PM Medical Record Number: [id number] Patient Account Number: [id number] Date of Birth/Sex: Treating RN: [birth year] (89 y.o. F) Primary Care [name]: [name] Other Clinician: Referring Renesha Lizama: Treating Kaelea Gathright/Extender: Trevor Mace in Treatment: 18 Vital Signs Height(in): 62 Pulse(bpm): 55 Weight(lbs): 105 Blood Pressure(mmHg): 134/55 Body Mass Index(BMI): 19.2 Temperature(F): 98.1 Respiratory Rate(breaths/min): 18 [10:Photos:] [N/A:N/A] Right,  Lateral Foot N/A N/A Wound Location: Gradually Appeared N/A N/A Wounding Event: Pressure Ulcer N/A N/A Primary Etiology: Cataracts, Hypertension, Raynauds, N/A N/A Comorbid History: Rheumatoid Arthritis, Osteoarthritis 06/19/2023 N/A N/A Date Acquired: 11 N/A N/A Weeks of Treatment: Open N/A N/A Wound Status: No N/A N/A Wound Recurrence: 0.5x0.4x0.2 N/A N/A Measurements L x W x D (cm) 0.157 N/A N/A A (cm) : rea 0.031 N/A N/A Volume (cm) : 52.40% N/A N/A % Reduction in A rea: 53.00% N/A N/A % Reduction in Volume: Category/Stage III N/A N/A Classification: Medium N/A N/A Exudate A mount: Serosanguineous N/A N/A Exudate Type: red, brown N/A N/A Exudate Color: Distinct, outline attached N/A N/A Wound Margin: None Present (0%) N/A N/A Granulation A mount: Large (67-100%) N/A N/A Necrotic A mount: Fat Layer (Subcutaneous Tissue): Yes N/A N/A Exposed Structures: Fascia: No Tendon: No Muscle: No Joint: No Bone: No [name], [name] ([id number]) [phone].pdf Page 5 of 9 Small (1-33%) N/A N/A Epithelialization: Callus: Yes N/A N/A Periwound Skin Texture: Excoriation: No Induration: No Crepitus: No Rash: No Scarring: No Dry/Scaly: Yes N/A N/A Periwound Skin  Moisture: Maceration: No Atrophie Blanche: No N/A N/A Periwound Skin Color: Cyanosis: No Ecchymosis: No Erythema: No Hemosiderin Staining: No Mottled: No Pallor: No Rubor: No No Abnormality N/A N/A Temperature: Treatment Notes Electronic Signature(s) Signed: 09/26/2023 5:18:28 PM By: Baltazar Najjar MD Entered By: Baltazar Najjar on 09/26/2023 11:20:35 -------------------------------------------------------------------------------- Multi-Disciplinary Care Plan Details Patient Name: Date of Service: [place], [name] 09/26/2023 1:30 PM Medical Record Number: [id number] Patient Account Number: [id number] Date of Birth/Sex: Treating RN: 12-22-1932 (89 y.o. [name] Primary Care [name]: [name] Other Clinician: Referring [name]: Treating [name]/Extender: Trevor Mace in Treatment: 18 Multidisciplinary Care Plan reviewed with physician Active Inactive Wound/Skin Impairment Nursing Diagnoses: Impaired tissue integrity Knowledge deficit related to ulceration/compromised skin integrity Goals: Patient/caregiver will verbalize understanding of skin care regimen Date Initiated: 05/23/2023 Target Resolution Date: 10/16/2023 Goal Status: Active Ulcer/skin breakdown will have a volume reduction of 30% by week 4 Date Initiated: 05/23/2023 Date Inactivated: 08/22/2023 Target Resolution Date: 08/20/2023 Unmet Reason: see wound Goal Status: Unmet measurement. Interventions: Assess patient/caregiver ability to obtain necessary supplies Assess patient/caregiver ability to perform ulcer/skin care regimen upon admission and as needed Assess ulceration(s) every visit Provide education on ulcer and skin care Treatment Activities: Skin care regimen initiated : 05/23/2023 Topical wound management initiated : 05/23/2023 Notes: Electronic Signature(s) Signed: 09/26/2023 5:04:55 PM By: Zenaida Deed RN, BSN [place], [place] L ([id number])  [id number] Page 6 of 9 Signed: 09/26/2023 5:04:55 PM By: Zenaida Deed RN, BSN Entered By: Zenaida Deed on 09/26/2023 10:58:34 -------------------------------------------------------------------------------- Pain Assessment Details Patient Name: Date of Service: [name], [name] 09/26/2023 1:30 PM Medical Record Number: [id number] Patient Account Number: [id number] Date of Birth/Sex: Treating RN: 05-01-1933 (89 y.o. F) Primary Care Mcgwire Dasaro: [name] Other Clinician: Referring Brizza Nathanson: Treating Yina Riviere/Extender: Trevor Mace in Treatment: 18 Active Problems Location of Pain Severity and Description of Pain Patient Has Paino No Site Locations Rate the pain. Current Pain Level: 0 Pain Management and Medication Current Pain Management: Electronic Signature(s) Signed: 09/26/2023 5:04:55 PM By: Zenaida Deed RN, BSN Previous Signature: 09/26/2023 1:46:11 PM Version By: [place] Scrape Entered By: Zenaida Deed on 09/26/2023 10:56:02 -------------------------------------------------------------------------------- Patient/Caregiver Education Details Patient Name: Date of Service: [place], [name] 12/11/2024andnbsp1:30 PM Medical Record Number: [id number] Patient Account Number: [id number] Date of Birth/Gender: Treating RN: January 14, 1933 (89 y.o. [name] Primary Care Physician: Blair Heys  R Other Clinician: Referring Physician: Treating Physician/Extender: Trevor Mace in Treatment: 18 Education Assessment Education Provided To: Patient [name], BUAN ([id number]) [id number] Page 7 of 9 Education Topics Provided Wound/Skin Impairment: Methods: Explain/Verbal Responses: Reinforcements needed, State content correctly Electronic Signature(s) Signed: 09/26/2023 5:04:55 PM By: Zenaida Deed RN, BSN Entered By: Zenaida Deed on 09/26/2023  10:58:49 -------------------------------------------------------------------------------- Wound Assessment Details Patient Name: Date of Service: [place], [name] 09/26/2023 1:30 PM Medical Record Number: [id number] Patient Account Number: [id number] Date of Birth/Sex: Treating RN: 07-12-1933 (89 y.o. F) Primary Care [name]: [name] Other Clinician: Referring [name]: Treating [name]/Extender: Trevor Mace in Treatment: 18 Wound Status Wound Number: 10 Primary Pressure Ulcer Etiology: Wound Location: Right, Lateral Foot Wound Status: Open Wounding Event: Gradually Appeared Comorbid Cataracts, Hypertension, Raynauds, Rheumatoid Arthritis, Date Acquired: 06/19/2023 History: Osteoarthritis Weeks Of Treatment: 11 Clustered Wound: No Photos Wound Measurements Length: (cm) 0.5 Width: (cm) 0.4 Depth: (cm) 0.2 Area: (cm) 0.157 Volume: (cm) 0.031 % Reduction in Area: 52.4% % Reduction in Volume: 53% Epithelialization: Small (1-33%) Tunneling: No Undermining: No Wound Description Classification: Category/Stage III Wound Margin: Distinct, outline attached Exudate Amount: Medium Exudate Type: Serosanguineous Exudate Color: red, brown Foul Odor After Cleansing: No Slough/Fibrino Yes Wound Bed Granulation Amount: None Present (0%) Exposed Structure Necrotic Amount: Large (67-100%) Fascia Exposed: No Necrotic Quality: Adherent Slough Fat Layer (Subcutaneous Tissue) Exposed: Yes Tendon Exposed: No Muscle Exposed: No Joint Exposed: No Bone Exposed: No [id number] JASONNA, VANROSSUM L ([id number]) [phone].pdf Page 8 of 9 Texture Color No Abnormalities Noted: No No Abnormalities Noted: Yes Callus: Yes Temperature / Pain Crepitus: No Temperature: No Abnormality Excoriation: No Induration: No Rash: No Scarring: No Moisture No Abnormalities Noted: Yes Treatment Notes Wound #10 (Foot) Wound Laterality:  Right, Lateral Cleanser Soap and Water Discharge Instruction: May shower and wash wound with dial antibacterial soap and water prior to dressing change. Peri-Wound Care Sween Lotion (Moisturizing lotion) Discharge Instruction: Apply moisturizing lotion as directed Topical Primary Dressing Endoform 2x2 in Discharge Instruction: Moisten with saline place into wound bed Secondary Dressing Optifoam Non-Adhesive Dressing, 4x4 in Discharge Instruction: Apply over primary dressing x1 layer foam donut. Woven Gauze Sponges 2x2 in Discharge Instruction: Apply over primary dressing as directed. Zetuvit Plus Silicone Border Dressing 4x4 (in/in) Discharge Instruction: Apply silicone border over primary dressing as directed. Secured With Compression Wrap Compression Stockings Facilities manager) Signed: 09/26/2023 5:04:55 PM By: Zenaida Deed RN, BSN Previous Signature: 09/26/2023 1:46:11 PM Version By: Dayton Scrape Entered By: Zenaida Deed on 09/26/2023 10:57:52 -------------------------------------------------------------------------------- Vitals Details Patient Name: Date of Service: [place], [name] 09/26/2023 1:30 PM Medical Record Number: [id number] Patient Account Number: [id number] Date of Birth/Sex: Treating RN: 11-13-1932 (89 y.o. F) Primary Care Demauri Advincula: [name] Other Clinician: Referring Asyia Hornung: Treating Haruna Rohlfs/Extender: Trevor Mace in Treatment: 18 Vital Signs Time Taken: 01:28 Temperature (F): 98.1 Height (in): 62 Pulse (bpm): 55 Weight (lbs): 105 Respiratory Rate (breaths/min): 18 Body Mass Index (BMI): 19.2 Blood Pressure (mmHg): 134/55 Reference Range: 80 - 120 mg / dl ADELLYN, LONSKI ([id number]) [phone].pdf Page 9 of 9 Electronic Signature(s) Signed: 09/26/2023 5:04:55 PM By: Zenaida Deed RN, BSN Previous Signature: 09/26/2023 1:46:11 PM Version By: Dayton Scrape Entered By:  Zenaida Deed on 09/26/2023 10:55:54

## 2023-09-27 NOTE — Progress Notes (Signed)
[name], [name] ([id number]) [id number] Page 1 of 7 Visit Report for 09/26/2023 HPI Details Patient Name: Date of Service: [place], [name] 09/26/2023 1:30 PM Medical Record Number: [id number] Patient Account Number: [id number] Date of Birth/Sex: Treating RN: 05/15/1933 (89 y.o. F) Primary Care Provider: [name] Other Clinician: Referring Provider: Treating Provider/Extender: Trevor Mace in Treatment: 18 History of Present Illness HPI Description: READMISSION 07/15/2019 Patient is now an 89 year old woman who was previously here in 2016 and 2018. Cared for at the time by Dr. Meyer Russel. Both times with wounds related to trauma in the left leg. She was felt to have underlying venous insufficiency although both occasions were related to trauma. The patient tells me that 2 weeks ago she hit her lateral left leg on the car door. This was a skin tear with a flap for a period of time she has been using Polysporin. The flap came off recently she has a clean looking superficial wound on the left lateral leg. Our intake nurse reported some drainage. Past medical history; includes sensorineural hearing loss, osteoarthritis, hypertension and a hammertoe on the right foot for which she follows with podiatry. ABIs in our clinic were 1.19 on the left 07/21/2019; the patient did not like the wraps. They slid down and rub the wound the wound is measuring larger she is upset. 10/12; left lateral leg wound. Most of this looks healthy even under illumination. We have been using Hydrofera Blue under border foam. She would not allow compression 10/19; left lateral leg wound. 2 small open areas remain of the original wound. We have been using Hydrofera Blue under border foam. This seems to be making decent progress 10/26 left lateral leg traumatic wound. There is no open wound remaining. We have been using Hydrofera Blue under border foam she arrived with  some denuded epithelium that I removed there is no open wound remaining. Is fairly clear she has some degree of chronic venous insufficiency with not a lot of edema but dilated veins in her feet. She reminds me that she also has reactive arthritis and was on prednisone for a prolonged period of time. Nevertheless I think it would be beneficial for her to at least wear support stockings but right now I do not think she is going to agree to the Readmission: 02/18/2020 upon evaluation today patient has sustained a skin tear which occurred about 1 week ago she tells me. Fortunately there does not appear to be any signs of active infection at this time which is excellent news. She has been tolerating the dressing changes without complication. Overall very pleased with where things stand at this point. The only issue that I see here is that the skin flap somewhat folded back and then reattached further down causing an area that is actually bunched up to form where it closed on itself but then reattached on the end of the tissue. Nonetheless I think we have to trim off the bunched up tissue in order to allow this to heal appropriately. That is the only issue I really see today. 03/10/2020 upon evaluation today patient actually appears to be doing excellent at this time. She has healed quite nicely and has been a couple of weeks since I saw her. Overall I feel like she is completely healed and ready for discharge as of today. Readmission: 03-21-2023 upon evaluation today patient appears to be doing somewhat poorly in regard to the left wound ulcer she tells me has been present  for about a month. She tells me that she had this on the metal flatbed shopping carts at [facility] and subsequently has been having issues here with this since she tells me that the original Band-Aid she was using caused this to get worse. With that being said I do not see any evidence of active infection at this time everything seems to  really be doing quite well as far as I am concerned. 03-28-2023 upon evaluation today patient appears to be doing well currently in regard to her wounds which in fact appear to be completely healed. I am very pleased with this. Readmission: 05-23-2023 upon evaluation patient presents for reevaluation here in the clinic concerning issues that she has been having with her left anterior lower extremity. This actually appears to have a significant amount of new skin growth over top of the areas of irritation I am not exactly sure what happened here to be perfectly honest. I explained to the patient that this is kind of an unusual presentation for this to be this irritated and yet not have any significant openings at this time. I do not see any evidence of active infection locally or systemically which is great news. 05-30-2023 upon evaluation today patient appears to be doing well currently in regard to her lower extremity wounds which are actually drying out the may be a little bit too dry. I think PolyMem may be better for her and we discussed that today. Fortunately I do not see any signs of active infection locally or systemically which is great news. 06-06-2023 upon evaluation today patient appears to be doing well currently in regard to her wounds. Both are showing signs of improvement which is great news. Fortunately I do not see any signs of active infection locally or systemically at this time. 8/28; the distal wound on the left leg is healed she still has a comma shaped proximal wound medially. She reminds as she will be leaving for [place] on September night. The remaining wound is on the right upper medial calf a comma shaped wound this may be closed by that time. We had some discussion about compression stockings 06-20-2023 upon evaluation today patient appears to be doing well currently in regard to her wounds in fact most everything appears to be about healed although she does have some dry skin  over top of the distal left lateral leg. The proximal medial leg is very close to closure and seems to be doing better. 07-11-2023 upon evaluation today patient appears to be doing well currently in regard to her wound. She has been tolerating the dressing changes without CURLIE, DELMUNDO ([id number]) [id number].pdf Page 2 of 7 complication. Fortunately there does not appear to be any signs of infection locally or systemically which is great news. Unfortunately she has been having issues with colitis and she appears to be very pale today she also tells me that she has been feeling fairly poorly. Her rheumatologist to put her on prednisone she is also been put on antibiotics for the colitis. Her wounds on the left anterior lower extremity have reinitiated a little bit here and I am beginning to suspect this may be more autoimmune related. 10-to-24 upon evaluation today patient appears to be doing well currently at this point with regard to her wound. She has been tolerating the dressing changes without complication. Fortunately I do not see any signs of worsening overall I believe that the patient is making good headway towards complete closure which is great news.  Still have a little concerned about the possibility of her having some issues here with a rheumatologic or dermatologic issue. I do believe that the steroid ointment has done a little bit better for her the triamcinolone I prescribed last week. 07-25-2023 upon evaluation today patient appears to be doing really about the same in regard to her wounds. The steroid ointment does not seem to make in a big difference unfortunately. Fortunately I do not see any signs of active infection locally or systemically which is good news. With that being said she does have 2 new pustules that have shown up she states they started looking like pimples initially and then have gotten worse since. With that being said I do believe that we  need to see what we can do to try to get this under control she does have an appointment with dermatology next week in the meantime I am actually see about getting her started with a antibiotic ointment instead of the steroid. 08-01-2023 upon evaluation today patient appears to be doing well currently in regard to her left leg which is actually showing signs of improvement the antibiotic ointment actually seems to be doing quite well. I am actually very pleased with where we stand currently. Fortunately I do not see any signs of active infection locally or systemically at this time. No fevers, chills, nausea, vomiting, or diarrhea. 08-08-2023 upon evaluation today patient appears to be doing actually pretty well in regard to her wound compared to where we were previous. I do think that the erythema has improved the wound size is getting a little bit smaller as I see her each week and in general I think that we are on the right track. There is no signs of significant infection although there is erythema I really feel like this may be more pressure related as it is blanchable as opposed to infection. Nonetheless I been leery to go forward with doing an oral antibiotic due to the history with the GI upset that she had more recently where she got very sick. Nonetheless if she is not significantly improved come next week we may need to consider going forward with the oral antibiotics just to be on the safe side for now we will continue topical mupirocin. 08-15-2023 upon evaluation today patient appears to be doing well currently in regard to her wound. She is actually showing signs of good improvement she does have some slough and biofilm noted we will get a going perform some debridement to clear away the slough and biofilm down to good subcutaneous tissue my hope is this will help to get the area to heal much more effectively and quickly with the collagen. 11/6; patient has an open wound at the base of  her fifth metatarsal on the right lateral foot. Small punched-out area with a nonviable surface some erythema around the wound which almost looks like a chronic bursitis rather than infection. She has previously had wounds on the left leg which are apparently healed 11/13; a wound at the base of her fifth metatarsal on the right lateral foot. This may actually represent a bunion deformity. Last week she had a punched-out area with nonviable surface I removed with a curette. We have been using Iodoflex to clean up the wound bed. She said this hurt for 2 or 3 days but it is settled down now. She is changing the dressing herself. 11/20; fifth metatarsal base at the right lateral foot. I think this is a chronic bursitis type  deformity/bunion deformity. We used Iodoflex up until now. Hope we will be changed to endoform today. No debridement. 12/3; right fifth metatarsal base. On almost a bunion like injury. Surrounding callus in this area in the middle of which is a open wound. We had been using Iodoflex however that this caused irritation so we changed to endoform. The wound looks better today there is no surrounding infection. We were considering going to an advanced tissue product either new shield or Puraply. 12/11; right fifth metatarsal base. Looks like a bunion type deformity to me. The wound is come in fairly nicely. Still an oval-shaped area I am uncertain whether there is epithelialization in the majority of this or whether this is slough I was reluctant debride this today. We have been using endoform with improvement. Consider using polymen. Electronic Signature(s) Signed: 09/26/2023 5:18:28 PM By: Baltazar Najjar MD Entered By: Baltazar Najjar on 09/26/2023 11:22:57 -------------------------------------------------------------------------------- Physical Exam Details Patient Name: Date of Service: [place], [name] 09/26/2023 1:30 PM Medical Record Number: [id number] Patient Account Number:  [id number] Date of Birth/Sex: Treating RN: [birth year] (89 y.o. F) Primary Care Provider: [name] Other Clinician: Referring Provider: Treating Provider/Extender: Trevor Mace in Treatment: 18 Constitutional Sitting or standing Blood Pressure is within target range for patient.. Pulse regular and within target range for patient.Marland Kitchen Respirations regular, non-labored and within target range.. Temperature is normal and within the target range for the patient.Marland Kitchen Appears in no distress. Notes Wound exam; base of the right fifth metatarsal. The good majority of this wound is epithelialized. Under illumination in the center is a small oval area with pinpoint in the middle of this. I am uncertain about how much of this is slough and how much of it is epithelialization. Electronic Signature(s) Signed: 09/26/2023 5:18:28 PM By: Baltazar Najjar MD Entered By: Baltazar Najjar on 09/26/2023 11:24:13 Jenna Luo ([id number]) [id number] Page 3 of 7 -------------------------------------------------------------------------------- Physician Orders Details Patient Name: Date of Service: [place], [name] 09/26/2023 1:30 PM Medical Record Number: [id number] Patient Account Number: [id number] Date of Birth/Sex: Treating RN: 25-Oct-1932 (89 y.o. [name] Primary Care Provider: [name] Other Clinician: Referring Provider: Treating Provider/Extender: Trevor Mace in Treatment: 18 The following information was scribed by: Zenaida Deed The information was scribed for: Baltazar Najjar Verbal / Phone Orders: No Diagnosis Coding ICD-10 Coding Code Description [phone] Pressure ulcer of other site, stage 3 I10 Essential (primary) hypertension Follow-up Appointments ppointment in 1 week. - Wednesday Dr. Leanord Hawking (already scheduled) 10/03/2023 1245pm Return A Anesthetic (In clinic) Topical Lidocaine 4%  applied to wound bed Cellular or Tissue Based Products Other Cellular or Tissue Based Products Orders/Instructions: - insurance auth for N[employer]ield from [employer] May shower and wash wound with soap and water. Edema Control - Orders / Instructions Elevate legs to the level of the heart or above for 30 minutes daily and/or when sitting for 3-4 times a day throughout the day. Avoid standing for long periods of time. Wound Treatment Wound #10 - Foot Wound Laterality: Right, Lateral Cleanser: Soap and Water Every Other Day/30 Days Discharge Instructions: May shower and wash wound with dial antibacterial soap and water prior to dressing change. Peri-Wound Care: Sween Lotion (Moisturizing lotion) Every Other Day/30 Days Discharge Instructions: Apply moisturizing lotion as directed Prim Dressing: Endoform 2x2 in (Generic) Every Other Day/30 Days ary Discharge Instructions: Moisten with saline place into wound bed Secondary Dressing: Optifoam Non-Adhesive Dressing, 4x4 in Every  Other Day/30 Days Discharge Instructions: Apply over primary dressing x1 layer foam donut. Secondary Dressing: Woven Gauze Sponges 2x2 in (Generic) Every Other Day/30 Days Discharge Instructions: Apply over primary dressing as directed. Secondary Dressing: Zetuvit Plus Silicone Border Dressing 4x4 (in/in) (Generic) Every Other Day/30 Days Discharge Instructions: Apply silicone border over primary dressing as directed. Electronic Signature(s) Signed: 09/26/2023 5:04:55 PM By: Zenaida Deed RN, BSN Signed: 09/26/2023 5:18:28 PM By: Baltazar Najjar MD Entered By: Zenaida Deed on 09/26/2023 11:12:41 Jenna Luo ([id number]) [phone].pdf Page 4 of 7 -------------------------------------------------------------------------------- Problem List Details Patient Name: Date of Service: [place], [name] 09/26/2023 1:30 PM Medical Record Number: [id number] Patient Account  Number: [id number] Date of Birth/Sex: Treating RN: [birth year] (89 y.o. [name] Primary Care Provider: [name] Other Clinician: Referring Provider: Treating Provider/Extender: Trevor Mace in Treatment: 18 Active Problems ICD-10 Encounter Code Description Active Date MDM Diagnosis L89.893 Pressure ulcer of other site, stage 3 07/11/2023 No Yes I10 Essential (primary) hypertension 05/23/2023 No Yes Inactive Problems ICD-10 Code Description Active Date Inactive Date I87.332 Chronic venous hypertension (idiopathic) with ulcer and inflammation of left lower 05/23/2023 05/23/2023 extremity L97.822 Non-pressure chronic ulcer of other part of left lower leg with fat layer exposed 05/23/2023 05/23/2023 R48.546 Non-pressure chronic ulcer of other part of left lower leg with fat layer exposed 05/23/2023 05/23/2023 Resolved Problems Electronic Signature(s) Signed: 09/26/2023 5:18:28 PM By: Baltazar Najjar MD Entered By: Baltazar Najjar on 09/26/2023 11:20:24 -------------------------------------------------------------------------------- Progress Note Details Patient Name: Date of Service: [place], [name] 09/26/2023 1:30 PM Medical Record Number: [id number] Patient Account Number: [id number] Date of Birth/Sex: Treating RN: 08-28-1933 (89 y.o. F) Primary Care Provider: [name] Other Clinician: Referring Provider: Treating Provider/Extender: Trevor Mace in Treatment: 18 Subjective History of Present Illness (HPI) READMISSION 07/15/2019 Patient is now an 89 year old woman who was previously here in 2016 and 2018. Cared for at the time by Dr. Meyer Russel. Both times with wounds related to trauma KIA, CARANDANG ([id number]) [phone].pdf Page 5 of 7 in the left leg. She was felt to have underlying venous insufficiency although both occasions were related to trauma. The patient tells me that 2 weeks ago  she hit her lateral left leg on the car door. This was a skin tear with a flap for a period of time she has been using Polysporin. The flap came off recently she has a clean looking superficial wound on the left lateral leg. Our intake nurse reported some drainage. Past medical history; includes sensorineural hearing loss, osteoarthritis, hypertension and a hammertoe on the right foot for which she follows with podiatry. ABIs in our clinic were 1.19 on the left 07/21/2019; the patient did not like the wraps. They slid down and rub the wound the wound is measuring larger she is upset. 10/12; left lateral leg wound. Most of this looks healthy even under illumination. We have been using Hydrofera Blue under border foam. She would not allow compression 10/19; left lateral leg wound. 2 small open areas remain of the original wound. We have been using Hydrofera Blue under border foam. This seems to be making decent progress 10/26 left lateral leg traumatic wound. There is no open wound remaining. We have been using Hydrofera Blue under border foam she arrived with some denuded epithelium that I removed there is no open wound remaining. Is fairly clear she has some degree of chronic venous insufficiency with not a lot of edema but dilated veins  in her feet. She reminds me that she also has reactive arthritis and was on prednisone for a prolonged period of time. Nevertheless I think it would be beneficial for her to at least wear support stockings but right now I do not think she is going to agree to the Readmission: 02/18/2020 upon evaluation today patient has sustained a skin tear which occurred about 1 week ago she tells me. Fortunately there does not appear to be any signs of active infection at this time which is excellent news. She has been tolerating the dressing changes without complication. Overall very pleased with where things stand at this point. The only issue that I see here is that the skin flap  somewhat folded back and then reattached further down causing an area that is actually bunched up to form where it closed on itself but then reattached on the end of the tissue. Nonetheless I think we have to trim off the bunched up tissue in order to allow this to heal appropriately. That is the only issue I really see today. 03/10/2020 upon evaluation today patient actually appears to be doing excellent at this time. She has healed quite nicely and has been a couple of weeks since I saw her. Overall I feel like she is completely healed and ready for discharge as of today. Readmission: 03-21-2023 upon evaluation today patient appears to be doing somewhat poorly in regard to the left wound ulcer she tells me has been present for about a month. She tells me that she had this on the metal flatbed shopping carts at [facility] and subsequently has been having issues here with this since she tells me that the original Band-Aid she was using caused this to get worse. With that being said I do not see any evidence of active infection at this time everything seems to really be doing quite well as far as I am concerned. 03-28-2023 upon evaluation today patient appears to be doing well currently in regard to her wounds which in fact appear to be completely healed. I am very pleased with this. Readmission: 05-23-2023 upon evaluation patient presents for reevaluation here in the clinic concerning issues that she has been having with her left anterior lower extremity. This actually appears to have a significant amount of new skin growth over top of the areas of irritation I am not exactly sure what happened here to be perfectly honest. I explained to the patient that this is kind of an unusual presentation for this to be this irritated and yet not have any significant openings at this time. I do not see any evidence of active infection locally or systemically which is great news. 05-30-2023 upon evaluation today patient  appears to be doing well currently in regard to her lower extremity wounds which are actually drying out the may be a little bit too dry. I think PolyMem may be better for her and we discussed that today. Fortunately I do not see any signs of active infection locally or systemically which is great news. 06-06-2023 upon evaluation today patient appears to be doing well currently in regard to her wounds. Both are showing signs of improvement which is great news. Fortunately I do not see any signs of active infection locally or systemically at this time. 8/28; the distal wound on the left leg is healed she still has a comma shaped proximal wound medially. She reminds as she will be leaving for [place] on September night. The remaining wound is on the right  upper medial calf a comma shaped wound this may be closed by that time. We had some discussion about compression stockings 06-20-2023 upon evaluation today patient appears to be doing well currently in regard to her wounds in fact most everything appears to be about healed although she does have some dry skin over top of the distal left lateral leg. The proximal medial leg is very close to closure and seems to be doing better. 07-11-2023 upon evaluation today patient appears to be doing well currently in regard to her wound. She has been tolerating the dressing changes without complication. Fortunately there does not appear to be any signs of infection locally or systemically which is great news. Unfortunately she has been having issues with colitis and she appears to be very pale today she also tells me that she has been feeling fairly poorly. Her rheumatologist to put her on prednisone she is also been put on antibiotics for the colitis. Her wounds on the left anterior lower extremity have reinitiated a little bit here and I am beginning to suspect this may be more autoimmune related. 10-to-24 upon evaluation today patient appears to be doing well  currently at this point with regard to her wound. She has been tolerating the dressing changes without complication. Fortunately I do not see any signs of worsening overall I believe that the patient is making good headway towards complete closure which is great news. Still have a little concerned about the possibility of her having some issues here with a rheumatologic or dermatologic issue. I do believe that the steroid ointment has done a little bit better for her the triamcinolone I prescribed last week. 07-25-2023 upon evaluation today patient appears to be doing really about the same in regard to her wounds. The steroid ointment does not seem to make in a big difference unfortunately. Fortunately I do not see any signs of active infection locally or systemically which is good news. With that being said she does have 2 new pustules that have shown up she states they started looking like pimples initially and then have gotten worse since. With that being said I do believe that we need to see what we can do to try to get this under control she does have an appointment with dermatology next week in the meantime I am actually see about getting her started with a antibiotic ointment instead of the steroid. 08-01-2023 upon evaluation today patient appears to be doing well currently in regard to her left leg which is actually showing signs of improvement the antibiotic ointment actually seems to be doing quite well. I am actually very pleased with where we stand currently. Fortunately I do not see any signs of active infection locally or systemically at this time. No fevers, chills, nausea, vomiting, or diarrhea. 08-08-2023 upon evaluation today patient appears to be doing actually pretty well in regard to her wound compared to where we were previous. I do think that the erythema has improved the wound size is getting a little bit smaller as I see her each week and in general I think that we are on the  right track. There is no signs of significant infection although there is erythema I really feel like this may be more pressure related as it is blanchable as opposed to infection. Nonetheless I been leery to go forward with doing an oral antibiotic due to the history with the GI upset that she had more recently where she got very sick. Nonetheless if she  is not significantly improved come next week we may need to consider going forward with the oral antibiotics just to be on the safe side for now we will continue topical mupirocin. 08-15-2023 upon evaluation today patient appears to be doing well currently in regard to her wound. She is actually showing signs of good improvement she does have some slough and biofilm noted we will get a going perform some debridement to clear away the slough and biofilm down to good subcutaneous tissue my hope is this will help to get the area to heal much more effectively and quickly with the collagen. [name], [name] ([id number]) [id number] Page 6 of 7 11/6; patient has an open wound at the base of her fifth metatarsal on the right lateral foot. Small punched-out area with a nonviable surface some erythema around the wound which almost looks like a chronic bursitis rather than infection. She has previously had wounds on the left leg which are apparently healed 11/13; a wound at the base of her fifth metatarsal on the right lateral foot. This may actually represent a bunion deformity. Last week she had a punched-out area with nonviable surface I removed with a curette. We have been using Iodoflex to clean up the wound bed. She said this hurt for 2 or 3 days but it is settled down now. She is changing the dressing herself. 11/20; fifth metatarsal base at the right lateral foot. I think this is a chronic bursitis type deformity/bunion deformity. We used Iodoflex up until now. Hope we will be changed to endoform today. No debridement. 12/3;  right fifth metatarsal base. On almost a bunion like injury. Surrounding callus in this area in the middle of which is a open wound. We had been using Iodoflex however that this caused irritation so we changed to endoform. The wound looks better today there is no surrounding infection. We were considering going to an advanced tissue product either new shield or Puraply. 12/11; right fifth metatarsal base. Looks like a bunion type deformity to me. The wound is come in fairly nicely. Still an oval-shaped area I am uncertain whether there is epithelialization in the majority of this or whether this is slough I was reluctant debride this today. We have been using endoform with improvement. Consider using polymen. Objective Constitutional Sitting or standing Blood Pressure is within target range for patient.. Pulse regular and within target range for patient.Marland Kitchen Respirations regular, non-labored and within target range.. Temperature is normal and within the target range for the patient.Marland Kitchen Appears in no distress. Vitals Time Taken: 1:28 AM, Height: 62 in, Weight: 105 lbs, BMI: 19.2, Temperature: 98.1 F, Pulse: 55 bpm, Respiratory Rate: 18 breaths/min, Blood Pressure: 134/55 mmHg. General Notes: Wound exam; base of the right fifth metatarsal. The good majority of this wound is epithelialized. Under illumination in the center is a small oval area with pinpoint in the middle of this. I am uncertain about how much of this is slough and how much of it is epithelialization. Integumentary (Hair, Skin) Wound #10 status is Open. Original cause of wound was Gradually Appeared. The date acquired was: 06/19/2023. The wound has been in treatment 11 weeks. The wound is located on the Right,Lateral Foot. The wound measures 0.5cm length x 0.4cm width x 0.2cm depth; 0.157cm^2 area and 0.031cm^3 volume. There is Fat Layer (Subcutaneous Tissue) exposed. There is no tunneling or undermining noted. There is a medium amount of  serosanguineous drainage noted. The wound margin is distinct with the outline attached to  the wound base. There is no granulation within the wound bed. There is a large (67-100%) amount of necrotic tissue within the wound bed including Adherent Slough. The periwound skin appearance had no abnormalities noted for moisture. The periwound skin appearance had no abnormalities noted for color. The periwound skin appearance exhibited: Callus. The periwound skin appearance did not exhibit: Crepitus, Excoriation, Induration, Rash, Scarring. Periwound temperature was noted as No Abnormality. Assessment Active Problems ICD-10 Pressure ulcer of other site, stage 3 Essential (primary) hypertension Plan Follow-up Appointments: Return Appointment in 1 week. - Wednesday Dr. Leanord Hawking (already scheduled) 10/03/2023 1245pm Anesthetic: (In clinic) Topical Lidocaine 4% applied to wound bed Cellular or Tissue Based Products: Other Cellular or Tissue Based Products Orders/Instructions: - insurance auth for N[employer]ield from [employer]: May shower and wash wound with soap and water. Edema Control - Orders / Instructions: Elevate legs to the level of the heart or above for 30 minutes daily and/or when sitting for 3-4 times a day throughout the day. Avoid standing for long periods of time. WOUND #10: - Foot Wound Laterality: Right, Lateral Cleanser: Soap and Water Every Other Day/30 Days Discharge Instructions: May shower and wash wound with dial antibacterial soap and water prior to dressing change. Peri-Wound Care: Sween Lotion (Moisturizing lotion) Every Other Day/30 Days Discharge Instructions: Apply moisturizing lotion as directed Prim Dressing: Endoform 2x2 in (Generic) Every Other Day/30 Days ary Discharge Instructions: Moisten with saline place into wound bed Secondary Dressing: Optifoam Non-Adhesive Dressing, 4x4 in Every Other Day/30 Days Discharge Instructions: Apply over  primary dressing x1 layer foam donut. Secondary Dressing: Woven Gauze Sponges 2x2 in (Generic) Every Other Day/30 Days CHARLETHA, RENFER ([id number]) [id number] Page 7 of 7 Discharge Instructions: Apply over primary dressing as directed. Secondary Dressing: Zetuvit Plus Silicone Border Dressing 4x4 (in/in) (Generic) Every Other Day/30 Days Discharge Instructions: Apply silicone border over primary dressing as directed. 1. Endoform, OPTi foam, foam border. #2 consider polymen is the primary dressing 3. I may need to probe at this to determine how much of this is actually epithelialized Electronic Signature(s) Signed: 09/26/2023 5:18:28 PM By: Baltazar Najjar MD Entered By: Baltazar Najjar on 09/26/2023 11:25:16 -------------------------------------------------------------------------------- SuperBill Details Patient Name: Date of Service: [place], [name] 09/26/2023 Medical Record Number: [id number] Patient Account Number: [id number] Date of Birth/Sex: Treating RN: [birth year] (89 y.o. [name] Primary Care Provider: [name] Other Clinician: Referring Provider: Treating Provider/Extender: Trevor Mace in Treatment: 18 Diagnosis Coding ICD-10 Codes Code Description [phone] Pressure ulcer of other site, stage 3 I10 Essential (primary) hypertension Facility Procedures : CPT4 Code: [id number] Description: 99213 - WOUND CARE VISIT-LEV 3 EST PT Modifier: Quantity: 1 Physician Procedures : CPT4 Code Description Modifier [id number] 99213 - WC PHYS LEVEL 3 - EST PT ICD-10 Diagnosis Description L89.893 Pressure ulcer of other site, stage 3 Quantity: 1 Electronic Signature(s) Signed: 09/26/2023 5:18:28 PM By: Baltazar Najjar MD Entered By: Baltazar Najjar on 09/26/2023 11:25:36

## 2023-10-01 DIAGNOSIS — M7061 Trochanteric bursitis, right hip: Secondary | ICD-10-CM | POA: Diagnosis not present

## 2023-10-03 ENCOUNTER — Encounter (HOSPITAL_BASED_OUTPATIENT_CLINIC_OR_DEPARTMENT_OTHER): Payer: PPO | Admitting: Internal Medicine

## 2023-10-03 DIAGNOSIS — L89893 Pressure ulcer of other site, stage 3: Secondary | ICD-10-CM | POA: Diagnosis not present

## 2023-10-04 NOTE — Progress Notes (Signed)
[name], [name] ([id number]) [id number] Page 1 of 8 Visit Report for 09/19/2023 Arrival Information Details Patient Name: Date of Service: [name] 09/19/2023 2:00 PM Medical Record Number: [id number] Patient Account Number: [id number] Date of Birth/Sex: Treating RN: [birth year] (89 y.o. F) Primary Care Tiphany Fayson: [name] Other Clinician: Referring Carmello Cabiness: Treating Nikaela Coyne/Extender: Trevor Mace in Treatment: 17 Visit Information History Since Last Visit Added or deleted any medications: No Patient Arrived: Ambulatory Any new allergies or adverse reactions: No Arrival Time: 14:02 Had a fall or experienced change in No Accompanied By: self activities of daily living that may affect Transfer Assistance: Manual risk of falls: Patient Identification Verified: Yes Signs or symptoms of abuse/neglect since last visito No Secondary Verification Process Completed: Yes Hospitalized since last visit: No Patient Requires Transmission-Based Precautions: No Implantable device outside of the clinic excluding No Patient Has Alerts: No cellular tissue based products placed in the center since last visit: Has Dressing in Place as Prescribed: Yes Pain Present Now: No Electronic Signature(s) Signed: 10/04/2023 9:56:00 AM By: Karl Ito Entered By: Karl Ito on 09/19/2023 11:04:11 -------------------------------------------------------------------------------- Clinic Level of Care Assessment Details Patient Name: Date of Service: [name] 09/19/2023 2:00 PM Medical Record Number: [id number] Patient Account Number: [id number] Date of Birth/Sex: Treating RN: 1933/03/18 (89 y.o. Arta Silence Primary Care Talaya Lamprecht: [name] Other Clinician: Referring Bronx Brogden: Treating Burt Piatek/Extender: Trevor Mace in Treatment: 17 Clinic Level of Care Assessment Items TOOL 4 Quantity  Score X- 1 0 Use when only an EandM is performed on FOLLOW-UP visit ASSESSMENTS - Nursing Assessment / Reassessment X- 1 10 Reassessment of Co-morbidities (includes updates in patient status) X- 1 5 Reassessment of Adherence to Treatment Plan ASSESSMENTS - Wound and Skin A ssessment / Reassessment X - Simple Wound Assessment / Reassessment - one wound 1 5 []  - 0 Complex Wound Assessment / Reassessment - multiple wounds []  - 0 Dermatologic / Skin Assessment (not related to wound area) ASSESSMENTS - Focused Assessment []  - 0 Circumferential Edema Measurements - multi extremities []  - 0 Nutritional Assessment / Counseling / Intervention [name] ([id number]) [phone].pdf Page 2 of 8 []  - 0 Lower Extremity Assessment (monofilament, tuning fork, pulses) []  - 0 Peripheral Arterial Disease Assessment (using hand held doppler) ASSESSMENTS - Ostomy and/or Continence Assessment and Care []  - 0 Incontinence Assessment and Management []  - 0 Ostomy Care Assessment and Management (repouching, etc.) PROCESS - Coordination of Care X - Simple Patient / Family Education for ongoing care 1 15 []  - 0 Complex (extensive) Patient / Family Education for ongoing care X- 1 10 Staff obtains Chiropractor, Records, T Results / Process Orders est []  - 0 Staff telephones HHA, Nursing Homes / Clarify orders / etc []  - 0 Routine Transfer to another Facility (non-emergent condition) []  - 0 Routine Hospital Admission (non-emergent condition) []  - 0 New Admissions / Manufacturing engineer / Ordering NPWT Apligraf, etc. , []  - 0 Emergency Hospital Admission (emergent condition) X- 1 10 Simple Discharge Coordination []  - 0 Complex (extensive) Discharge Coordination PROCESS - Special Needs []  - 0 Pediatric / Minor Patient Management []  - 0 Isolation Patient Management []  - 0 Hearing / Language / Visual special needs []  - 0 Assessment of Community assistance  (transportation, D/C planning, etc.) []  - 0 Additional assistance / Altered mentation []  - 0 Support Surface(s) Assessment (bed, cushion, seat, etc.) INTERVENTIONS - Wound Cleansing / Measurement X - Simple Wound Cleansing -  one wound 1 5 []  - 0 Complex Wound Cleansing - multiple wounds X- 1 5 Wound Imaging (photographs - any number of wounds) []  - 0 Wound Tracing (instead of photographs) X- 1 5 Simple Wound Measurement - one wound []  - 0 Complex Wound Measurement - multiple wounds INTERVENTIONS - Wound Dressings X - Small Wound Dressing one or multiple wounds 1 10 []  - 0 Medium Wound Dressing one or multiple wounds []  - 0 Large Wound Dressing one or multiple wounds []  - 0 Application of Medications - topical []  - 0 Application of Medications - injection INTERVENTIONS - Miscellaneous []  - 0 External ear exam []  - 0 Specimen Collection (cultures, biopsies, blood, body fluids, etc.) []  - 0 Specimen(s) / Culture(s) sent or taken to Lab for analysis []  - 0 Patient Transfer (multiple staff / Nurse, adult / Similar devices) []  - 0 Simple Staple / Suture removal (25 or less) []  - 0 Complex Staple / Suture removal (26 or more) []  - 0 Hypo / Hyperglycemic Management (close monitor of Blood Glucose) [name], [name] ([id number]) [phone].pdf Page 3 of 8 []  - 0 Ankle / Brachial Index (ABI) - do not check if billed separately X- 1 5 Vital Signs Has the patient been seen at the hospital within the last three years: Yes Total Score: 85 Level Of Care: New/Established - Level 3 Electronic Signature(s) Signed: 09/20/2023 4:52:59 PM By: Shawn Stall RN, BSN Entered By: Shawn Stall on 09/19/2023 11:35:21 -------------------------------------------------------------------------------- Encounter Discharge Information Details Patient Name: Date of Service: [name]. 09/19/2023 2:00 PM Medical Record Number: [id number] Patient Account Number:  [id number] Date of Birth/Sex: Treating RN: 03/27/1933 (89 y.o. Arta Silence Primary Care Miana Politte: [name] Other Clinician: Referring Andrus Sharp: Treating Myrle Wanek/Extender: Trevor Mace in Treatment: 17 Encounter Discharge Information Items Discharge Condition: Stable Ambulatory Status: Ambulatory Discharge Destination: Home Transportation: Private Auto Accompanied By: self Schedule Follow-up Appointment: Yes Clinical Summary of Care: Electronic Signature(s) Signed: 09/20/2023 4:52:59 PM By: Shawn Stall RN, BSN Entered By: Shawn Stall on 09/19/2023 11:36:11 -------------------------------------------------------------------------------- Lower Extremity Assessment Details Patient Name: Date of Service: [place], [name] 09/19/2023 2:00 PM Medical Record Number: [id number] Patient Account Number: [id number] Date of Birth/Sex: Treating RN: 01/02/1933 (89 y.o. Arta Silence Primary Care Miquan Tandon: [name] Other Clinician: Referring Lucianna Ostlund: Treating Luana Tatro/Extender: Trevor Mace in Treatment: 17 Edema Assessment Assessed: Kyra Searles: No] Franne Forts: Yes] Edema: [Left: No] [Right: No] Calf Left: Right: Point of Measurement: From Medial Instep 29.8 cm Ankle Left: Right: Point of Measurement: From Medial Instep 17 cm Vascular Assessment LAJASMINE, SIM L ([id number]) [Right:[id number] Page 4 of 8] Pulses: Dorsalis Pedis Palpable: [Right:Yes] Extremity colors, hair growth, and conditions: Extremity Color: [Left:Hyperpigmented] [Right:Hyperpigmented] Hair Growth on Extremity: [Left:Yes] [Right:No] Temperature of Extremity: [Left:Warm] [Right:Cold] Capillary Refill: [Left:< 3 seconds] [Right:< 3 seconds] Dependent Rubor: [Left:Yes No] [Right:No No] Toe Nail Assessment Left: Right: Thick: No Discolored: No Deformed: No Improper Length and Hygiene: No Electronic  Signature(s) Signed: 09/20/2023 4:52:59 PM By: Shawn Stall RN, BSN Entered By: Shawn Stall on 09/19/2023 11:11:13 -------------------------------------------------------------------------------- Multi Wound Chart Details Patient Name: Date of Service: [place], Kela [name]. 09/19/2023 2:00 PM Medical Record Number: [id number] Patient Account Number: [id number] Date of Birth/Sex: Treating RN: 1932/11/18 (89 y.o. F) Primary Care Allyn Bertoni: [name] Other Clinician: Referring Brandy Zuba: Treating Talen Poser/Extender: Trevor Mace in Treatment: 17 Vital Signs Height(in): 62 Pulse(bpm): 56 Weight(lbs): 105 Blood Pressure(mmHg): 159/73 Body Mass  Index(BMI): 19.2 Temperature(F): 97.7 Respiratory Rate(breaths/min): 18 [10:Photos: No Photos Right, Lateral Foot Wound Location: Gradually Appeared Wounding Event: Pressure Ulcer Primary Etiology: Cataracts, Hypertension, Raynauds, N/A Comorbid History: Rheumatoid Arthritis, Osteoarthritis 06/19/2023 Date Acquired: 10 Weeks of Treatment:  Open Wound Status: No Wound Recurrence: 0.7x0.4x0.2 Measurements L x W x D (cm) 0.22 A (cm) : rea 0.044 Volume (cm) : 33.30% % Reduction in A rea: 33.30% % Reduction in Volume: Category/Stage III Classification: Medium Exudate A mount: Serosanguineous  Exudate Type: red, brown Exudate Color: Distinct, outline attached Wound Margin: Small (1-33%) Granulation A mount: Pink, Pale Granulation Quality: Large (67-100%) Necrotic A mount: Fat Layer (Subcutaneous Tissue): Yes N/A Exposed Structures: Fascia: No  Tendon: No Muscle: No Joint: No Bone: No] [N/A:N/A N/A N/A N/A N/A N/A N/A N/A N/A N/A N/A N/A N/A N/A N/A N/A N/A N/A N/A N/A N/A] DARINDA, GARTON ([id number]) [10:Small (1-33%) Epithelialization: Excoriation: No Periwound Skin Texture: Induration: No Callus: No Crepitus: No Rash: No Scarring: No Dry/Scaly: Yes Periwound Skin Moisture: Maceration: No Atrophie Blanche: No Periwound Skin Color:  Cyanosis: No  Ecchymosis: No Erythema: No Hemosiderin Staining: No Mottled: No Pallor: No Rubor: No No Abnormality Temperature:] [N/A:N/A N/A N/A N/A N/A] Treatment Notes Wound #10 (Foot) Wound Laterality: Right, Lateral Cleanser Soap and Water Discharge Instruction: May shower and wash wound with dial antibacterial soap and water prior to dressing change. Peri-Wound Care Sween Lotion (Moisturizing lotion) Discharge Instruction: Apply moisturizing lotion as directed Topical Primary Dressing Endoform 2x2 in Discharge Instruction: Moisten with saline place into wound bed Secondary Dressing Optifoam Non-Adhesive Dressing, 4x4 in Discharge Instruction: Apply over primary dressing x1 layer foam donut. Woven Gauze Sponges 2x2 in Discharge Instruction: Apply over primary dressing as directed. Zetuvit Plus Silicone Border Dressing 4x4 (in/in) Discharge Instruction: Apply silicone border over primary dressing as directed. Secured With Compression Wrap Compression Stockings Facilities manager) Signed: 09/20/2023 4:45:28 AM By: Baltazar Najjar MD Entered By: Baltazar Najjar on 09/19/2023 11:58:17 -------------------------------------------------------------------------------- Multi-Disciplinary Care Plan Details Patient Name: Date of Service: [place], [name] 09/19/2023 2:00 PM Medical Record Number: [id number] Patient Account Number: [id number] Date of Birth/Sex: Treating RN: [birth year] (89 y.o. Arta Silence Primary Care Eduin Friedel: [name] Other Clinician: Referring Shashank Kwasnik: Treating Miho Monda/Extender: Trevor Mace in Treatment: 17 Multidisciplinary Care Plan reviewed with physician DEANNE, VOLPE ([id number]) [id number] Page 6 of 8 Active Inactive Wound/Skin Impairment Nursing Diagnoses: Impaired tissue integrity Knowledge deficit related to ulceration/compromised skin integrity Goals: Patient/caregiver  will verbalize understanding of skin care regimen Date Initiated: 05/23/2023 Target Resolution Date: 10/16/2023 Goal Status: Active Ulcer/skin breakdown will have a volume reduction of 30% by week 4 Date Initiated: 05/23/2023 Date Inactivated: 08/22/2023 Target Resolution Date: 08/20/2023 Unmet Reason: see wound Goal Status: Unmet measurement. Interventions: Assess patient/caregiver ability to obtain necessary supplies Assess patient/caregiver ability to perform ulcer/skin care regimen upon admission and as needed Assess ulceration(s) every visit Provide education on ulcer and skin care Treatment Activities: Skin care regimen initiated : 05/23/2023 Topical wound management initiated : 05/23/2023 Notes: Electronic Signature(s) Signed: 09/20/2023 4:52:59 PM By: Shawn Stall RN, BSN Entered By: Shawn Stall on 09/19/2023 11:11:52 -------------------------------------------------------------------------------- Pain Assessment Details Patient Name: Date of Service: [place], Kela [name]. 09/19/2023 2:00 PM Medical Record Number: [id number] Patient Account Number: [id number] Date of Birth/Sex: Treating RN: Sep 30, 1933 (89 y.o. F) Primary Care Pinchus Weckwerth: [name] Other Clinician: Referring Stella Bortle: Treating Efe Fazzino/Extender: Trevor Mace in Treatment: 17 Active Problems Location of Pain Severity  and Description of Pain Patient Has Paino No Site Locations Pain Management and Medication Current Pain Management: ROMIKA, CREWS ([id number]) [phone].pdf Page 7 of 8 Electronic Signature(s) Signed: 10/04/2023 9:56:00 AM By: Karl Ito Entered By: Karl Ito on 09/19/2023 11:04:56 -------------------------------------------------------------------------------- Patient/Caregiver Education Details Patient Name: Date of Service: [place], [name] 12/4/2024andnbsp2:00 PM Medical Record Number: [id number] Patient Account Number:  [id number] Date of Birth/Gender: Treating RN: Oct 27, 1932 (89 y.o. Arta Silence Primary Care Physician: [name] Other Clinician: Referring Physician: Treating Physician/Extender: Trevor Mace in Treatment: 17 Education Assessment Education Provided To: Patient Education Topics Provided Wound/Skin Impairment: Handouts: Caring for Your Ulcer Methods: Explain/Verbal Responses: Reinforcements needed Electronic Signature(s) Signed: 09/20/2023 4:52:59 PM By: Shawn Stall RN, BSN Entered By: Shawn Stall on 09/19/2023 11:12:09 -------------------------------------------------------------------------------- Wound Assessment Details Patient Name: Date of Service: [place], Kela [name]. 09/19/2023 2:00 PM Medical Record Number: [id number] Patient Account Number: [id number] Date of Birth/Sex: Treating RN: [birth year] (89 y.o. F) Primary Care Genna Casimir: [name] Other Clinician: Referring Diana Davenport: Treating Ethan Kasperski/Extender: Trevor Mace in Treatment: 17 Wound Status Wound Number: 10 Primary Pressure Ulcer Etiology: Wound Location: Right, Lateral Foot Wound Status: Open Wounding Event: Gradually Appeared Comorbid Cataracts, Hypertension, Raynauds, Rheumatoid Arthritis, Date Acquired: 06/19/2023 History: Osteoarthritis Weeks Of Treatment: 10 Clustered Wound: No Wound Measurements Length: (cm) 0.7 Width: (cm) 0.4 Depth: (cm) 0.2 Area: (cm) 0.22 Volume: (cm) 0.044 % Reduction in Area: 33.3% % Reduction in Volume: 33.3% Epithelialization: Small (1-33%) Tunneling: No Undermining: No Wound Description LIEN, KAIGLER ([id number]) Classification: Category/Stage III Wound Margin: Distinct, outline attached Exudate Amount: Medium Exudate Type: Serosanguineous Exudate Color: red, brown [id number] Page 8 of 8 Foul Odor After Cleansing: No Slough/Fibrino Yes Wound Bed Granulation Amount:  Small (1-33%) Exposed Structure Granulation Quality: Pink, Pale Fascia Exposed: No Necrotic Amount: Large (67-100%) Fat Layer (Subcutaneous Tissue) Exposed: Yes Necrotic Quality: Adherent Slough Tendon Exposed: No Muscle Exposed: No Joint Exposed: No Bone Exposed: No Periwound Skin Texture Texture Color No Abnormalities Noted: No No Abnormalities Noted: No Callus: No Atrophie Blanche: No Crepitus: No Cyanosis: No Excoriation: No Ecchymosis: No Induration: No Erythema: No Rash: No Hemosiderin Staining: No Scarring: No Mottled: No Pallor: No Moisture Rubor: No No Abnormalities Noted: No Dry / Scaly: Yes Temperature / Pain Maceration: No Temperature: No Abnormality Electronic Signature(s) Signed: 09/20/2023 4:52:59 PM By: Shawn Stall RN, BSN Entered By: Shawn Stall on 09/19/2023 11:11:27 -------------------------------------------------------------------------------- Vitals Details Patient Name: Date of Service: [name], Kela [name]. 09/19/2023 2:00 PM Medical Record Number: [id number] Patient Account Number: [id number] Date of Birth/Sex: Treating RN: 07/10/1933 (89 y.o. F) Primary Care Joachim Carton: [name] Other Clinician: Referring Deira Shimer: Treating Jennye Runquist/Extender: Trevor Mace in Treatment: 17 Vital Signs Time Taken: 14:04 Temperature (F): 97.7 Height (in): 62 Pulse (bpm): 56 Weight (lbs): 105 Respiratory Rate (breaths/min): 18 Body Mass Index (BMI): 19.2 Blood Pressure (mmHg): 159/73 Reference Range: 80 - 120 mg / dl Electronic Signature(s) Signed: 10/04/2023 9:56:00 AM By: Karl Ito Entered By: Karl Ito on 09/19/2023 11:04:46

## 2023-10-05 NOTE — Progress Notes (Signed)
TAFFANY, GERWIN ([id number]) [id number] Page 1 of 7 Visit Report for 10/03/2023 HPI Details Patient Name: Date of Service: [name], [name] 10/03/2023 12:45 PM Medical Record Number: [id number] Patient Account Number: [id number] Date of Birth/Sex: Treating RN: 05-11-1933 (89 y.o. F) Primary Care Provider: [name] Other Clinician: Referring Provider: Treating Provider/Extender: Trevor Mace in Treatment: 19 History of Present Illness HPI Description: READMISSION 07/15/2019 Patient is now an 89 year old woman who was previously here in 2016 and 2018. Cared for at the time by Dr. Meyer Russel. Both times with wounds related to trauma in the left leg. She was felt to have underlying venous insufficiency although both occasions were related to trauma. The patient tells me that 2 weeks ago she hit her lateral left leg on the car door. This was a skin tear with a flap for a period of time she has been using Polysporin. The flap came off recently she has a clean looking superficial wound on the left lateral leg. Our intake nurse reported some drainage. Past medical history; includes sensorineural hearing loss, osteoarthritis, hypertension and a hammertoe on the right foot for which she follows with podiatry. ABIs in our clinic were 1.19 on the left 07/21/2019; the patient did not like the wraps. They slid down and rub the wound the wound is measuring larger she is upset. 10/12; left lateral leg wound. Most of this looks healthy even under illumination. We have been using Hydrofera Blue under border foam. She would not allow compression 10/19; left lateral leg wound. 2 small open areas remain of the original wound. We have been using Hydrofera Blue under border foam. This seems to be making decent progress 10/26 left lateral leg traumatic wound. There is no open wound remaining. We have been using Hydrofera Blue under border foam she arrived with  some denuded epithelium that I removed there is no open wound remaining. Is fairly clear she has some degree of chronic venous insufficiency with not a lot of edema but dilated veins in her feet. She reminds me that she also has reactive arthritis and was on prednisone for a prolonged period of time. Nevertheless I think it would be beneficial for her to at least wear support stockings but right now I do not think she is going to agree to the Readmission: 02/18/2020 upon evaluation today patient has sustained a skin tear which occurred about 1 week ago she tells me. Fortunately there does not appear to be any signs of active infection at this time which is excellent news. She has been tolerating the dressing changes without complication. Overall very pleased with where things stand at this point. The only issue that I see here is that the skin flap somewhat folded back and then reattached further down causing an area that is actually bunched up to form where it closed on itself but then reattached on the end of the tissue. Nonetheless I think we have to trim off the bunched up tissue in order to allow this to heal appropriately. That is the only issue I really see today. 03/10/2020 upon evaluation today patient actually appears to be doing excellent at this time. She has healed quite nicely and has been a couple of weeks since I saw her. Overall I feel like she is completely healed and ready for discharge as of today. Readmission: 03-21-2023 upon evaluation today patient appears to be doing somewhat poorly in regard to the left wound ulcer she tells me has been present  for about a month. She tells me that she had this on the metal flatbed shopping carts at [facility] and subsequently has been having issues here with this since she tells me that the original Band-Aid she was using caused this to get worse. With that being said I do not see any evidence of active infection at this time everything seems to  really be doing quite well as far as I am concerned. 03-28-2023 upon evaluation today patient appears to be doing well currently in regard to her wounds which in fact appear to be completely healed. I am very pleased with this. Readmission: 05-23-2023 upon evaluation patient presents for reevaluation here in the clinic concerning issues that she has been having with her left anterior lower extremity. This actually appears to have a significant amount of new skin growth over top of the areas of irritation I am not exactly sure what happened here to be perfectly honest. I explained to the patient that this is kind of an unusual presentation for this to be this irritated and yet not have any significant openings at this time. I do not see any evidence of active infection locally or systemically which is great news. 05-30-2023 upon evaluation today patient appears to be doing well currently in regard to her lower extremity wounds which are actually drying out the may be a little bit too dry. I think PolyMem may be better for her and we discussed that today. Fortunately I do not see any signs of active infection locally or systemically which is great news. 06-06-2023 upon evaluation today patient appears to be doing well currently in regard to her wounds. Both are showing signs of improvement which is great news. Fortunately I do not see any signs of active infection locally or systemically at this time. 8/28; the distal wound on the left leg is healed she still has a comma shaped proximal wound medially. She reminds as she will be leaving for [place] on September night. The remaining wound is on the right upper medial calf a comma shaped wound this may be closed by that time. We had some discussion about compression stockings 06-20-2023 upon evaluation today patient appears to be doing well currently in regard to her wounds in fact most everything appears to be about healed although she does have some dry skin  over top of the distal left lateral leg. The proximal medial leg is very close to closure and seems to be doing better. 07-11-2023 upon evaluation today patient appears to be doing well currently in regard to her wound. She has been tolerating the dressing changes without TALYAH, DUNAVAN ([id number]) [phone].pdf Page 2 of 7 complication. Fortunately there does not appear to be any signs of infection locally or systemically which is great news. Unfortunately she has been having issues with colitis and she appears to be very pale today she also tells me that she has been feeling fairly poorly. Her rheumatologist to put her on prednisone she is also been put on antibiotics for the colitis. Her wounds on the left anterior lower extremity have reinitiated a little bit here and I am beginning to suspect this may be more autoimmune related. 10-to-24 upon evaluation today patient appears to be doing well currently at this point with regard to her wound. She has been tolerating the dressing changes without complication. Fortunately I do not see any signs of worsening overall I believe that the patient is making good headway towards complete closure which is great news.  Still have a little concerned about the possibility of her having some issues here with a rheumatologic or dermatologic issue. I do believe that the steroid ointment has done a little bit better for her the triamcinolone I prescribed last week. 07-25-2023 upon evaluation today patient appears to be doing really about the same in regard to her wounds. The steroid ointment does not seem to make in a big difference unfortunately. Fortunately I do not see any signs of active infection locally or systemically which is good news. With that being said she does have 2 new pustules that have shown up she states they started looking like pimples initially and then have gotten worse since. With that being said I do believe that we  need to see what we can do to try to get this under control she does have an appointment with dermatology next week in the meantime I am actually see about getting her started with a antibiotic ointment instead of the steroid. 08-01-2023 upon evaluation today patient appears to be doing well currently in regard to her left leg which is actually showing signs of improvement the antibiotic ointment actually seems to be doing quite well. I am actually very pleased with where we stand currently. Fortunately I do not see any signs of active infection locally or systemically at this time. No fevers, chills, nausea, vomiting, or diarrhea. 08-08-2023 upon evaluation today patient appears to be doing actually pretty well in regard to her wound compared to where we were previous. I do think that the erythema has improved the wound size is getting a little bit smaller as I see her each week and in general I think that we are on the right track. There is no signs of significant infection although there is erythema I really feel like this may be more pressure related as it is blanchable as opposed to infection. Nonetheless I been leery to go forward with doing an oral antibiotic due to the history with the GI upset that she had more recently where she got very sick. Nonetheless if she is not significantly improved come next week we may need to consider going forward with the oral antibiotics just to be on the safe side for now we will continue topical mupirocin. 08-15-2023 upon evaluation today patient appears to be doing well currently in regard to her wound. She is actually showing signs of good improvement she does have some slough and biofilm noted we will get a going perform some debridement to clear away the slough and biofilm down to good subcutaneous tissue my hope is this will help to get the area to heal much more effectively and quickly with the collagen. 11/6; patient has an open wound at the base of  her fifth metatarsal on the right lateral foot. Small punched-out area with a nonviable surface some erythema around the wound which almost looks like a chronic bursitis rather than infection. She has previously had wounds on the left leg which are apparently healed 11/13; a wound at the base of her fifth metatarsal on the right lateral foot. This may actually represent a bunion deformity. Last week she had a punched-out area with nonviable surface I removed with a curette. We have been using Iodoflex to clean up the wound bed. She said this hurt for 2 or 3 days but it is settled down now. She is changing the dressing herself. 11/20; fifth metatarsal base at the right lateral foot. I think this is a chronic bursitis type  deformity/bunion deformity. We used Iodoflex up until now. Hope we will be changed to endoform today. No debridement. 12/3; right fifth metatarsal base. On almost a bunion like injury. Surrounding callus in this area in the middle of which is a open wound. We had been using Iodoflex however that this caused irritation so we changed to endoform. The wound looks better today there is no surrounding infection. We were considering going to an advanced tissue product either new shield or Puraply. 12/11; right fifth metatarsal base. Looks like a bunion type deformity to me. The wound is come in fairly nicely. Still an oval-shaped area I am uncertain whether there is epithelialization in the majority of this or whether this is slough I was reluctant debride this today. We have been using endoform with improvement. Consider using polymen. 12/18; right fifth metatarsal base. Again I I really think this is a bunion type deformity. Careful expect inspection of the wound shows about 40% of this is still open 60% epithelialized. She still has rims of callus around this worrisome for continued pressure and/or friction in this area. We have been using endoform. I am going to change back to polymen  today Electronic Signature(s) Signed: 10/04/2023 12:11:14 PM By: Baltazar Najjar MD Entered By: Baltazar Najjar on 10/03/2023 13:23:45 -------------------------------------------------------------------------------- Physical Exam Details Patient Name: Date of Service: [place], [name] 10/03/2023 12:45 PM Medical Record Number: [id number] Patient Account Number: [id number] Date of Birth/Sex: Treating RN: Nov 05, 1932 (89 y.o. F) Primary Care Provider: [name] Other Clinician: Referring Provider: Treating Provider/Extender: Trevor Mace in Treatment: 19 Constitutional Patient is hypertensive.. Pulse regular and within target range for patient.Marland Kitchen Respirations regular, non-labored and within target range.. Temperature is normal and within the target range for the patient.Marland Kitchen Appears in no distress. Notes Wound exam; base of the right fifth metatarsal. The good majority of this is epithelialized. Still a small area that does not appear to be completely healed. I used a #3 curette to remove circumferential dry skin callus from just around the wound margin. Some debris from the wound surface I think this is epithelializing quite nicely LESTIE, DAKIN ([id number]) [id number] Page 3 of 7 Electronic Signature(s) Signed: 10/04/2023 12:11:14 PM By: Baltazar Najjar MD Entered By: Baltazar Najjar on 10/03/2023 13:25:23 -------------------------------------------------------------------------------- Physician Orders Details Patient Name: Date of Service: [place], [name] 10/03/2023 12:45 PM Medical Record Number: [id number] Patient Account Number: [id number] Date of Birth/Sex: Treating RN: 08-Dec-1932 (89 y.o. Orville Govern Primary Care Provider: [name] Other Clinician: Referring Provider: Treating Provider/Extender: Trevor Mace in Treatment: 61 Verbal / Phone Orders: No Diagnosis Coding Follow-up  Appointments ppointment in 2 weeks. - Dr Leanord Hawking please schedule Return A Anesthetic (In clinic) Topical Lidocaine 4% applied to wound bed Cellular or Tissue Based Products Other Cellular or Tissue Based Products Orders/Instructions: - insurance auth for Nushield from [employer] May shower and wash wound with soap and water. Edema Control - Orders / Instructions Elevate legs to the level of the heart or above for 30 minutes daily and/or when sitting for 3-4 times a day throughout the day. Avoid standing for long periods of time. Wound Treatment Wound #10 - Foot Wound Laterality: Right, Lateral Cleanser: Soap and Water Every Other Day/30 Days Discharge Instructions: May shower and wash wound with dial antibacterial soap and water prior to dressing change. Peri-Wound Care: Sween Lotion (Moisturizing lotion) Every Other Day/30 Days Discharge Instructions: Apply moisturizing lotion as directed  Prim Dressing: PolyMem Non-Adhesive Dressing, 4x4 in Every Other Day/30 Days ary Discharge Instructions: Apply to wound bed as instructed Secondary Dressing: Optifoam Non-Adhesive Dressing, 4x4 in Every Other Day/30 Days Discharge Instructions: Apply over primary dressing x1 layer foam donut. Secondary Dressing: Woven Gauze Sponges 2x2 in (Generic) Every Other Day/30 Days Discharge Instructions: Apply over primary dressing as directed. Secondary Dressing: Zetuvit Plus Silicone Border Dressing 4x4 (in/in) (Generic) Every Other Day/30 Days Discharge Instructions: Apply silicone border over primary dressing as directed. Patient Medications llergies: Sulfa (Sulfonamide Antibiotics), latex A Notifications Medication Indication Start End 10/03/2023 lidocaine DOSE topical 5 % ointment - ointment topical once daily Electronic Signature(s) Signed: 10/03/2023 5:04:26 PM By: Redmond Pulling RN, BSN Signed: 10/04/2023 12:11:14 PM By: Baltazar Najjar MD Entered By: Redmond Pulling  on 10/03/2023 13:11:06 Jenna Luo ([id number]) [id number] Page 4 of 7 -------------------------------------------------------------------------------- Problem List Details Patient Name: Date of Service: Mayd[place], Oday [name] 10/03/2023 12:45 PM Medical Record Number: [id number] Patient Account Number: [id number] Date of Birth/Sex: Treating RN: [birth year] (89 y.o. F) Primary Care Provider: [name] Other Clinician: Referring Provider: Treating Provider/Extender: Trevor Mace in Treatment: 19 Active Problems ICD-10 Encounter Code Description Active Date MDM Diagnosis L89.893 Pressure ulcer of other site, stage 3 07/11/2023 No Yes I10 Essential (primary) hypertension 05/23/2023 No Yes Inactive Problems ICD-10 Code Description Active Date Inactive Date I87.332 Chronic venous hypertension (idiopathic) with ulcer and inflammation of left lower 05/23/2023 05/23/2023 extremity L97.822 Non-pressure chronic ulcer of other part of left lower leg with fat layer exposed 05/23/2023 05/23/2023 U72.536 Non-pressure chronic ulcer of other part of left lower leg with fat layer exposed 05/23/2023 05/23/2023 Resolved Problems Electronic Signature(s) Signed: 10/04/2023 12:11:14 PM By: Baltazar Najjar MD Entered By: Baltazar Najjar on 10/03/2023 13:22:32 -------------------------------------------------------------------------------- Progress Note Details Patient Name: Date of Service: Mardelle Matte. 10/03/2023 12:45 PM Medical Record Number: [id number] Patient Account Number: [id number] Date of Birth/Sex: Treating RN: 03/06/1933 (89 y.o. F) Primary Care Provider: [name] Other Clinician: Referring Provider: Treating Provider/Extender: Trevor Mace in Treatment: 19 Subjective History of Present Illness (HPI) READMISSION 07/15/2019 LESLIEANNE, VOLNER ([id number]) [id number] Page 5 of  7 Patient is now an 89 year old woman who was previously here in 2016 and 2018. Cared for at the time by Dr. Meyer Russel. Both times with wounds related to trauma in the left leg. She was felt to have underlying venous insufficiency although both occasions were related to trauma. The patient tells me that 2 weeks ago she hit her lateral left leg on the car door. This was a skin tear with a flap for a period of time she has been using Polysporin. The flap came off recently she has a clean looking superficial wound on the left lateral leg. Our intake nurse reported some drainage. Past medical history; includes sensorineural hearing loss, osteoarthritis, hypertension and a hammertoe on the right foot for which she follows with podiatry. ABIs in our clinic were 1.19 on the left 07/21/2019; the patient did not like the wraps. They slid down and rub the wound the wound is measuring larger she is upset. 10/12; left lateral leg wound. Most of this looks healthy even under illumination. We have been using Hydrofera Blue under border foam. She would not allow compression 10/19; left lateral leg wound. 2 small open areas remain of the original wound. We have been using Hydrofera Blue under border foam. This seems to be making decent progress 10/26 left lateral leg traumatic  wound. There is no open wound remaining. We have been using Hydrofera Blue under border foam she arrived with some denuded epithelium that I removed there is no open wound remaining. Is fairly clear she has some degree of chronic venous insufficiency with not a lot of edema but dilated veins in her feet. She reminds me that she also has reactive arthritis and was on prednisone for a prolonged period of time. Nevertheless I think it would be beneficial for her to at least wear support stockings but right now I do not think she is going to agree to the Readmission: 02/18/2020 upon evaluation today patient has sustained a skin tear which occurred  about 1 week ago she tells me. Fortunately there does not appear to be any signs of active infection at this time which is excellent news. She has been tolerating the dressing changes without complication. Overall very pleased with where things stand at this point. The only issue that I see here is that the skin flap somewhat folded back and then reattached further down causing an area that is actually bunched up to form where it closed on itself but then reattached on the end of the tissue. Nonetheless I think we have to trim off the bunched up tissue in order to allow this to heal appropriately. That is the only issue I really see today. 03/10/2020 upon evaluation today patient actually appears to be doing excellent at this time. She has healed quite nicely and has been a couple of weeks since I saw her. Overall I feel like she is completely healed and ready for discharge as of today. Readmission: 03-21-2023 upon evaluation today patient appears to be doing somewhat poorly in regard to the left wound ulcer she tells me has been present for about a month. She tells me that she had this on the metal flatbed shopping carts at [facility] and subsequently has been having issues here with this since she tells me that the original Band-Aid she was using caused this to get worse. With that being said I do not see any evidence of active infection at this time everything seems to really be doing quite well as far as I am concerned. 03-28-2023 upon evaluation today patient appears to be doing well currently in regard to her wounds which in fact appear to be completely healed. I am very pleased with this. Readmission: 05-23-2023 upon evaluation patient presents for reevaluation here in the clinic concerning issues that she has been having with her left anterior lower extremity. This actually appears to have a significant amount of new skin growth over top of the areas of irritation I am not exactly sure what happened  here to be perfectly honest. I explained to the patient that this is kind of an unusual presentation for this to be this irritated and yet not have any significant openings at this time. I do not see any evidence of active infection locally or systemically which is great news. 05-30-2023 upon evaluation today patient appears to be doing well currently in regard to her lower extremity wounds which are actually drying out the may be a little bit too dry. I think PolyMem may be better for her and we discussed that today. Fortunately I do not see any signs of active infection locally or systemically which is great news. 06-06-2023 upon evaluation today patient appears to be doing well currently in regard to her wounds. Both are showing signs of improvement which is great news. Fortunately I do  not see any signs of active infection locally or systemically at this time. 8/28; the distal wound on the left leg is healed she still has a comma shaped proximal wound medially. She reminds as she will be leaving for [place] on September night. The remaining wound is on the right upper medial calf a comma shaped wound this may be closed by that time. We had some discussion about compression stockings 06-20-2023 upon evaluation today patient appears to be doing well currently in regard to her wounds in fact most everything appears to be about healed although she does have some dry skin over top of the distal left lateral leg. The proximal medial leg is very close to closure and seems to be doing better. 07-11-2023 upon evaluation today patient appears to be doing well currently in regard to her wound. She has been tolerating the dressing changes without complication. Fortunately there does not appear to be any signs of infection locally or systemically which is great news. Unfortunately she has been having issues with colitis and she appears to be very pale today she also tells me that she has been feeling fairly poorly.  Her rheumatologist to put her on prednisone she is also been put on antibiotics for the colitis. Her wounds on the left anterior lower extremity have reinitiated a little bit here and I am beginning to suspect this may be more autoimmune related. 10-to-24 upon evaluation today patient appears to be doing well currently at this point with regard to her wound. She has been tolerating the dressing changes without complication. Fortunately I do not see any signs of worsening overall I believe that the patient is making good headway towards complete closure which is great news. Still have a little concerned about the possibility of her having some issues here with a rheumatologic or dermatologic issue. I do believe that the steroid ointment has done a little bit better for her the triamcinolone I prescribed last week. 07-25-2023 upon evaluation today patient appears to be doing really about the same in regard to her wounds. The steroid ointment does not seem to make in a big difference unfortunately. Fortunately I do not see any signs of active infection locally or systemically which is good news. With that being said she does have 2 new pustules that have shown up she states they started looking like pimples initially and then have gotten worse since. With that being said I do believe that we need to see what we can do to try to get this under control she does have an appointment with dermatology next week in the meantime I am actually see about getting her started with a antibiotic ointment instead of the steroid. 08-01-2023 upon evaluation today patient appears to be doing well currently in regard to her left leg which is actually showing signs of improvement the antibiotic ointment actually seems to be doing quite well. I am actually very pleased with where we stand currently. Fortunately I do not see any signs of active infection locally or systemically at this time. No fevers, chills, nausea, vomiting,  or diarrhea. 08-08-2023 upon evaluation today patient appears to be doing actually pretty well in regard to her wound compared to where we were previous. I do think that the erythema has improved the wound size is getting a little bit smaller as I see her each week and in general I think that we are on the right track. There is no signs of significant infection although there is erythema  I really feel like this may be more pressure related as it is blanchable as opposed to infection. Nonetheless I been leery to go forward with doing an oral antibiotic due to the history with the GI upset that she had more recently where she got very sick. Nonetheless if she is not significantly improved come next week we may need to consider going forward with the oral antibiotics just to be on the safe side for now we will continue topical mupirocin. 08-15-2023 upon evaluation today patient appears to be doing well currently in regard to her wound. She is actually showing signs of good improvement she does have some slough and biofilm noted we will get a going perform some debridement to clear away the slough and biofilm down to good subcutaneous MARVA, KROMER L ([id number]) [id number].pdf Page 6 of 7 tissue my hope is this will help to get the area to heal much more effectively and quickly with the collagen. 11/6; patient has an open wound at the base of her fifth metatarsal on the right lateral foot. Small punched-out area with a nonviable surface some erythema around the wound which almost looks like a chronic bursitis rather than infection. She has previously had wounds on the left leg which are apparently healed 11/13; a wound at the base of her fifth metatarsal on the right lateral foot. This may actually represent a bunion deformity. Last week she had a punched-out area with nonviable surface I removed with a curette. We have been using Iodoflex to clean up the wound bed. She said this  hurt for 2 or 3 days but it is settled down now. She is changing the dressing herself. 11/20; fifth metatarsal base at the right lateral foot. I think this is a chronic bursitis type deformity/bunion deformity. We used Iodoflex up until now. Hope we will be changed to endoform today. No debridement. 12/3; right fifth metatarsal base. On almost a bunion like injury. Surrounding callus in this area in the middle of which is a open wound. We had been using Iodoflex however that this caused irritation so we changed to endoform. The wound looks better today there is no surrounding infection. We were considering going to an advanced tissue product either new shield or Puraply. 12/11; right fifth metatarsal base. Looks like a bunion type deformity to me. The wound is come in fairly nicely. Still an oval-shaped area I am uncertain whether there is epithelialization in the majority of this or whether this is slough I was reluctant debride this today. We have been using endoform with improvement. Consider using polymen. 12/18; right fifth metatarsal base. Again I I really think this is a bunion type deformity. Careful expect inspection of the wound shows about 40% of this is still open 60% epithelialized. She still has rims of callus around this worrisome for continued pressure and/or friction in this area. We have been using endoform. I am going to change back to polymen today Objective Constitutional Patient is hypertensive.. Pulse regular and within target range for patient.Marland Kitchen Respirations regular, non-labored and within target range.. Temperature is normal and within the target range for the patient.Marland Kitchen Appears in no distress. Vitals Time Taken: 12:45 PM, Height: 62 in, Weight: 105 lbs, BMI: 19.2, Temperature: 98.1 F, Pulse: 54 bpm, Respiratory Rate: 18 breaths/min, Blood Pressure: 172/70 mmHg. General Notes: Wound exam; base of the right fifth metatarsal. The good majority of this is epithelialized. Still  a small area that does not appear to be completely healed. I used  a #3 curette to remove circumferential dry skin callus from just around the wound margin. Some debris from the wound surface I think this is epithelializing quite nicely Integumentary (Hair, Skin) Wound #10 status is Open. Original cause of wound was Gradually Appeared. The date acquired was: 06/19/2023. The wound has been in treatment 12 weeks. The wound is located on the Right,Lateral Foot. The wound measures 0.5cm length x 0.4cm width x 0.2cm depth; 0.157cm^2 area and 0.031cm^3 volume. There is Fat Layer (Subcutaneous Tissue) exposed. There is no tunneling or undermining noted. There is a medium amount of serosanguineous drainage noted. The wound margin is distinct with the outline attached to the wound base. There is small (1-33%) red granulation within the wound bed. There is a large (67-100%) amount of necrotic tissue within the wound bed including Adherent Slough. The periwound skin appearance had no abnormalities noted for moisture. The periwound skin appearance had no abnormalities noted for color. The periwound skin appearance exhibited: Callus. The periwound skin appearance did not exhibit: Crepitus, Excoriation, Induration, Rash, Scarring. Periwound temperature was noted as No Abnormality. Assessment Active Problems ICD-10 Pressure ulcer of other site, stage 3 Essential (primary) hypertension Plan Follow-up Appointments: Return Appointment in 2 weeks. - Dr Leanord Hawking please schedule Anesthetic: (In clinic) Topical Lidocaine 4% applied to wound bed Cellular or Tissue Based Products: Other Cellular or Tissue Based Products Orders/Instructions: - insurance auth for [employer] from [employer]: May shower and wash wound with soap and water. Edema Control - Orders / Instructions: Elevate legs to the level of the heart or above for 30 minutes daily and/or when sitting for 3-4 times a day throughout  the day. Avoid standing for long periods of time. The following medication(s) was prescribed: lidocaine topical 5 % ointment ointment topical once daily was prescribed at facility WOUND #10: - Foot Wound Laterality: Right, Lateral MUNEERA, WEGENER ([id number]) [id number] Page 7 of 7 Cleanser: Soap and Water Every Other Day/30 Days Discharge Instructions: May shower and wash wound with dial antibacterial soap and water prior to dressing change. Peri-Wound Care: Sween Lotion (Moisturizing lotion) Every Other Day/30 Days Discharge Instructions: Apply moisturizing lotion as directed Prim Dressing: PolyMem Non-Adhesive Dressing, 4x4 in Every Other Day/30 Days ary Discharge Instructions: Apply to wound bed as instructed Secondary Dressing: Optifoam Non-Adhesive Dressing, 4x4 in Every Other Day/30 Days Discharge Instructions: Apply over primary dressing x1 layer foam donut. Secondary Dressing: Woven Gauze Sponges 2x2 in (Generic) Every Other Day/30 Days Discharge Instructions: Apply over primary dressing as directed. Secondary Dressing: Zetuvit Plus Silicone Border Dressing 4x4 (in/in) (Generic) Every Other Day/30 Days Discharge Instructions: Apply silicone border over primary dressing as directed. 1. I had designs of asking her to wear a surgical sandal but I did not think I would when this argument 2. I changed her to regular polymen/bordered foam 3. I have asked her to pad this in her shoes. She wears socks at home Electronic Signature(s) Signed: 10/04/2023 12:11:14 PM By: Baltazar Najjar MD Entered By: Baltazar Najjar on 10/03/2023 13:26:35 -------------------------------------------------------------------------------- SuperBill Details Patient Name: Date of Service: [place], [name] 10/03/2023 Medical Record Number: [id number] Patient Account Number: [id number] Date of Birth/Sex: Treating RN: 10-17-1932 (89 y.o. F) Primary Care Provider: [name] Other  Clinician: Referring Provider: Treating Provider/Extender: Trevor Mace in Treatment: 19 Diagnosis Coding ICD-10 Codes Code Description [phone] Pressure ulcer of other site, stage 3 I10 Essential (primary) hypertension Facility Procedures : CPT4 Code: [id number] Description: 99213 - WOUND CARE  VISIT-LEV 3 EST PT Modifier: Quantity: 1 Physician Procedures : CPT4 Code Description Modifier [id number] 99213 - WC PHYS LEVEL 3 - EST PT ICD-10 Diagnosis Description L89.893 Pressure ulcer of other site, stage 3 I10 Essential (primary) hypertension Quantity: 1 Electronic Signature(s) Signed: 10/03/2023 5:04:26 PM By: Redmond Pulling RN, BSN Signed: 10/04/2023 12:11:14 PM By: Baltazar Najjar MD Entered By: Redmond Pulling on 10/03/2023 17:01:08

## 2023-10-05 NOTE — Progress Notes (Signed)
TEISHA, KAZMIERSKI ([id number]) [id number] Page 1 of 9 Visit Report for 10/03/2023 Arrival Information Details Patient Name: Date of Service: [place], [name] 10/03/2023 12:45 PM Medical Record Number: [id number] Patient Account Number: [id number] Date of Birth/Sex: Treating RN: 10/13/1933 (89 y.o. [name] Primary Care Ison Wichmann: [name] Other Clinician: Referring Mariadel Mruk: Treating Blakelynn Scheeler/Extender: Trevor Mace in Treatment: 19 Visit Information History Since Last Visit Added or deleted any medications: No Patient Arrived: Ambulatory Any new allergies or adverse reactions: No Arrival Time: 12:46 Had a fall or experienced change in No Accompanied By: self activities of daily living that may affect Transfer Assistance: None risk of falls: Patient Identification Verified: Yes Signs or symptoms of abuse/neglect since last visito No Secondary Verification Process Completed: Yes Hospitalized since last visit: No Patient Requires Transmission-Based Precautions: No Implantable device outside of the clinic excluding No Patient Has Alerts: No cellular tissue based products placed in the center since last visit: Has Dressing in Place as Prescribed: Yes Pain Present Now: No Electronic Signature(s) Signed: 10/03/2023 5:04:26 PM By: Redmond Pulling RN, BSN Entered By: Redmond Pulling on 10/03/2023 12:48:15 -------------------------------------------------------------------------------- Clinic Level of Care Assessment Details Patient Name: Date of Service: [place], [name] 10/03/2023 12:45 PM Medical Record Number: [id number] Patient Account Number: [id number] Date of Birth/Sex: Treating RN: 01-Feb-1933 (89 y.o. [name] Primary Care Shavonn Convey: [name] Other Clinician: Referring Sherrin Stahle: Treating Lakeidra Reliford/Extender: Trevor Mace in Treatment: 19 Clinic Level of Care Assessment  Items TOOL 4 Quantity Score X- 1 0 Use when only an EandM is performed on FOLLOW-UP visit ASSESSMENTS - Nursing Assessment / Reassessment X- 1 10 Reassessment of Co-morbidities (includes updates in patient status) X- 1 5 Reassessment of Adherence to Treatment Plan ASSESSMENTS - Wound and Skin A ssessment / Reassessment X - Simple Wound Assessment / Reassessment - one wound 1 5 []  - 0 Complex Wound Assessment / Reassessment - multiple wounds []  - 0 Dermatologic / Skin Assessment (not related to wound area) ASSESSMENTS - Focused Assessment X- 1 5 Circumferential Edema Measurements - multi extremities []  - 0 Nutritional Assessment / Counseling / Intervention [name], [name] ([id number]) [phone].pdf Page 2 of 9 []  - 0 Lower Extremity Assessment (monofilament, tuning fork, pulses) []  - 0 Peripheral Arterial Disease Assessment (using hand held doppler) ASSESSMENTS - Ostomy and/or Continence Assessment and Care []  - 0 Incontinence Assessment and Management []  - 0 Ostomy Care Assessment and Management (repouching, etc.) PROCESS - Coordination of Care X - Simple Patient / Family Education for ongoing care 1 15 []  - 0 Complex (extensive) Patient / Family Education for ongoing care X- 1 10 Staff obtains Chiropractor, Records, T Results / Process Orders est []  - 0 Staff telephones HHA, Nursing Homes / Clarify orders / etc []  - 0 Routine Transfer to another Facility (non-emergent condition) []  - 0 Routine Hospital Admission (non-emergent condition) []  - 0 New Admissions / Manufacturing engineer / Ordering NPWT Apligraf, etc. , []  - 0 Emergency Hospital Admission (emergent condition) []  - 0 Simple Discharge Coordination []  - 0 Complex (extensive) Discharge Coordination PROCESS - Special Needs []  - 0 Pediatric / Minor Patient Management []  - 0 Isolation Patient Management []  - 0 Hearing / Language / Visual special needs []  - 0 Assessment of Community  assistance (transportation, D/C planning, etc.) []  - 0 Additional assistance / Altered mentation []  - 0 Support Surface(s) Assessment (bed, cushion, seat, etc.) INTERVENTIONS - Wound Cleansing / Measurement X -  Simple Wound Cleansing - one wound 1 5 []  - 0 Complex Wound Cleansing - multiple wounds X- 1 5 Wound Imaging (photographs - any number of wounds) []  - 0 Wound Tracing (instead of photographs) X- 1 5 Simple Wound Measurement - one wound []  - 0 Complex Wound Measurement - multiple wounds INTERVENTIONS - Wound Dressings X - Small Wound Dressing one or multiple wounds 1 10 []  - 0 Medium Wound Dressing one or multiple wounds []  - 0 Large Wound Dressing one or multiple wounds []  - 0 Application of Medications - topical []  - 0 Application of Medications - injection INTERVENTIONS - Miscellaneous []  - 0 External ear exam []  - 0 Specimen Collection (cultures, biopsies, blood, body fluids, etc.) []  - 0 Specimen(s) / Culture(s) sent or taken to Lab for analysis []  - 0 Patient Transfer (multiple staff / Nurse, adult / Similar devices) []  - 0 Simple Staple / Suture removal (25 or less) []  - 0 Complex Staple / Suture removal (26 or more) []  - 0 Hypo / Hyperglycemic Management (close monitor of Blood Glucose) [name], [name] ([id number]) [phone].pdf Page 3 of 9 []  - 0 Ankle / Brachial Index (ABI) - do not check if billed separately X- 1 5 Vital Signs Has the patient been seen at the hospital within the last three years: Yes Total Score: 80 Level Of Care: New/Established - Level 3 Electronic Signature(s) Signed: 10/03/2023 5:04:26 PM By: Redmond Pulling RN, BSN Entered By: Redmond Pulling on 10/03/2023 17:00:58 -------------------------------------------------------------------------------- Encounter Discharge Information Details Patient Name: Date of Service: [place], [name] 10/03/2023 12:45 PM Medical Record Number: [id number] Patient Account  Number: [id number] Date of Birth/Sex: Treating RN: 1933-06-22 (89 y.o. [name] Primary Care Jeston Junkins: [name] Other Clinician: Referring Tauren Delbuono: Treating Kayliah Tindol/Extender: Trevor Mace in Treatment: 19 Encounter Discharge Information Items Discharge Condition: Stable Ambulatory Status: Ambulatory Discharge Destination: Home Transportation: Private Auto Accompanied By: self Schedule Follow-up Appointment: Yes Clinical Summary of Care: Patient Declined Electronic Signature(s) Signed: 10/03/2023 5:04:26 PM By: Redmond Pulling RN, BSN Entered By: Redmond Pulling on 10/03/2023 17:01:40 -------------------------------------------------------------------------------- Lower Extremity Assessment Details Patient Name: Date of Service: [place], [name] 10/03/2023 12:45 PM Medical Record Number: [id number] Patient Account Number: [id number] Date of Birth/Sex: Treating RN: [birth year] (89 y.o. [name] Primary Care Bahar Shelden: [name] Other Clinician: Referring Seraphim Affinito: Treating Markelle Asaro/Extender: Trevor Mace in Treatment: 19 Edema Assessment Assessed: [Left: No] [Right: No] Edema: [Left: N] [Right: o] Calf Left: Right: Point of Measurement: From Medial Instep 28.5 cm Ankle Left: Right: Point of Measurement: From Medial Instep 17.4 cm Vascular Assessment [name], [name] ([id number]) [Right:[id number] Page 4 of 9] Pulses: Dorsalis Pedis Palpable: [Right:Yes] Extremity colors, hair growth, and conditions: Extremity Color: [Right:Hyperpigmented] Hair Growth on Extremity: [Right:No] Temperature of Extremity: [Right:Cold] Capillary Refill: [Right:< 3 seconds] Dependent Rubor: [Right:No No] Electronic Signature(s) Signed: 10/03/2023 5:04:26 PM By: Redmond Pulling RN, BSN Entered By: Redmond Pulling on 10/03/2023  12:49:28 -------------------------------------------------------------------------------- Multi Wound Chart Details Patient Name: Date of Service: [place], [name] 10/03/2023 12:45 PM Medical Record Number: [id number] Patient Account Number: [id number] Date of Birth/Sex: Treating RN: 07/03/1933 (89 y.o. F) Primary Care Tokiko Diefenderfer: [name] Other Clinician: Referring Izzy Courville: Treating Ahyana Skillin/Extender: Trevor Mace in Treatment: 19 Vital Signs Height(in): 62 Pulse(bpm): 54 Weight(lbs): 105 Blood Pressure(mmHg): 172/70 Body Mass Index(BMI): 19.2 Temperature(F): 98.1 Respiratory Rate(breaths/min): 18 [10:Photos:] [N/A:N/A] Right, Lateral Foot N/A N/A Wound Location: Gradually Appeared  N/A N/A Wounding Event: Pressure Ulcer N/A N/A Primary Etiology: Cataracts, Hypertension, Raynauds, N/A N/A Comorbid History: Rheumatoid Arthritis, Osteoarthritis 06/19/2023 N/A N/A Date Acquired: 12 N/A N/A Weeks of Treatment: Open N/A N/A Wound Status: No N/A N/A Wound Recurrence: 0.5x0.4x0.2 N/A N/A Measurements [name] x W x D (cm) 0.157 N/A N/A A (cm) : rea 0.031 N/A N/A Volume (cm) : 52.40% N/A N/A % Reduction in A rea: 53.00% N/A N/A % Reduction in Volume: Category/Stage III N/A N/A Classification: Medium N/A N/A Exudate A mount: Serosanguineous N/A N/A Exudate Type: red, brown N/A N/A Exudate Color: Distinct, outline attached N/A N/A Wound Margin: Small (1-33%) N/A N/A Granulation A mount: Red N/A N/A Granulation Quality: Large (67-100%) N/A N/A Necrotic A mount: Fat Layer (Subcutaneous Tissue): Yes N/A N/A Exposed Structures: Fascia: No Tendon: No Muscle: No Joint: No [name], [name] ([id number]) [id number] Page 5 of 9 Bone: No Small (1-33%) N/A N/A Epithelialization: Callus: Yes N/A N/A Periwound Skin Texture: Excoriation: No Induration: No Crepitus: No Rash: No Scarring: No Dry/Scaly: Yes N/A  N/A Periwound Skin Moisture: Maceration: No Atrophie Blanche: No N/A N/A Periwound Skin Color: Cyanosis: No Ecchymosis: No Erythema: No Hemosiderin Staining: No Mottled: No Pallor: No Rubor: No No Abnormality N/A N/A Temperature: Treatment Notes Electronic Signature(s) Signed: 10/04/2023 12:11:14 PM By: Baltazar Najjar MD Entered By: Baltazar Najjar on 10/03/2023 13:22:45 -------------------------------------------------------------------------------- Multi-Disciplinary Care Plan Details Patient Name: Date of Service: [place], [name] 10/03/2023 12:45 PM Medical Record Number: [id number] Patient Account Number: [id number] Date of Birth/Sex: Treating RN: [birth year] (89 y.o. [name] Primary Care Nashon Erbes: [name] Other Clinician: Referring Tylin Stradley: Treating Camila Maita/Extender: Trevor Mace in Treatment: 19 Multidisciplinary Care Plan reviewed with physician Active Inactive Wound/Skin Impairment Nursing Diagnoses: Impaired tissue integrity Knowledge deficit related to ulceration/compromised skin integrity Goals: Patient/caregiver will verbalize understanding of skin care regimen Date Initiated: 05/23/2023 Target Resolution Date: 10/16/2023 Goal Status: Active Ulcer/skin breakdown will have a volume reduction of 30% by week 4 Date Initiated: 05/23/2023 Date Inactivated: 08/22/2023 Target Resolution Date: 08/20/2023 Unmet Reason: see wound Goal Status: Unmet measurement. Interventions: Assess patient/caregiver ability to obtain necessary supplies Assess patient/caregiver ability to perform ulcer/skin care regimen upon admission and as needed Assess ulceration(s) every visit Provide education on ulcer and skin care Treatment Activities: Skin care regimen initiated : 05/23/2023 Topical wound management initiated : 05/23/2023 Notes: Electronic Signature(s) [name], [name] ([id number]) [id number] Page 6 of  9 Signed: 10/03/2023 5:04:26 PM By: Redmond Pulling RN, BSN Entered By: Redmond Pulling on 10/03/2023 12:53:06 -------------------------------------------------------------------------------- Pain Assessment Details Patient Name: Date of Service: [place], [name] 10/03/2023 12:45 PM Medical Record Number: [id number] Patient Account Number: [id number] Date of Birth/Sex: Treating RN: May 28, 1933 (89 y.o. [name] Primary Care Berdia Lachman: [name] Other Clinician: Referring Tina Temme: Treating Tracie Dore/Extender: Trevor Mace in Treatment: 19 Active Problems Location of Pain Severity and Description of Pain Patient Has Paino No Site Locations Pain Management and Medication Current Pain Management: Electronic Signature(s) Signed: 10/03/2023 5:04:26 PM By: Redmond Pulling RN, BSN Entered By: Redmond Pulling on 10/03/2023 12:48:38 -------------------------------------------------------------------------------- Patient/Caregiver Education Details Patient Name: Date of Service: Mardelle Matte 12/18/2024andnbsp12:45 PM Medical Record Number: [id number] Patient Account Number: [id number] Date of Birth/Gender: Treating RN: 03/22/1933 (89 y.o. [name] Primary Care Physician: [name] Other Clinician: Referring Physician: Treating Physician/Extender: Trevor Mace in Treatment: 19 Education Assessment Education Provided To: Patient [name], [name] ([id number]) [id number]  Page 7 of 9 Education Topics Provided Wound/Skin Impairment: Methods: Explain/Verbal Responses: State content correctly Electronic Signature(s) Signed: 10/03/2023 5:04:26 PM By: Redmond Pulling RN, BSN Entered By: Redmond Pulling on 10/03/2023 12:59:54 -------------------------------------------------------------------------------- Wound Assessment Details Patient Name: Date of Service: [place], [name] 10/03/2023  12:45 PM Medical Record Number: [id number] Patient Account Number: [id number] Date of Birth/Sex: Treating RN: 10-26-1932 (89 y.o. [name] Primary Care Shristi Scheib: [name] Other Clinician: Referring Annitta Fifield: Treating Maigen Mozingo/Extender: Trevor Mace in Treatment: 19 Wound Status Wound Number: 10 Primary Pressure Ulcer Etiology: Wound Location: Right, Lateral Foot Wound Status: Open Wounding Event: Gradually Appeared Comorbid Cataracts, Hypertension, Raynauds, Rheumatoid Arthritis, Date Acquired: 06/19/2023 History: Osteoarthritis Weeks Of Treatment: 12 Clustered Wound: No Photos Wound Measurements Length: (cm) 0.5 Width: (cm) 0.4 Depth: (cm) 0.2 Area: (cm) 0.157 Volume: (cm) 0.031 % Reduction in Area: 52.4% % Reduction in Volume: 53% Epithelialization: Small (1-33%) Tunneling: No Undermining: No Wound Description Classification: Category/Stage III Wound Margin: Distinct, outline attached Exudate Amount: Medium Exudate Type: Serosanguineous Exudate Color: red, brown Foul Odor After Cleansing: No Slough/Fibrino Yes Wound Bed Granulation Amount: Small (1-33%) Exposed Structure Granulation Quality: Red Fascia Exposed: No Necrotic Amount: Large (67-100%) Fat Layer (Subcutaneous Tissue) Exposed: Yes Necrotic Quality: Adherent Slough Tendon Exposed: No Muscle Exposed: No Joint Exposed: No Bone Exposed: No [address] [name], [name] ([id number]) [id number] Page 8 of 9 Texture Color No Abnormalities Noted: No No Abnormalities Noted: Yes Callus: Yes Temperature / Pain Crepitus: No Temperature: No Abnormality Excoriation: No Induration: No Rash: No Scarring: No Moisture No Abnormalities Noted: Yes Treatment Notes Wound #10 (Foot) Wound Laterality: Right, Lateral Cleanser Soap and Water Discharge Instruction: May shower and wash wound with dial antibacterial soap and water prior to  dressing change. Peri-Wound Care Sween Lotion (Moisturizing lotion) Discharge Instruction: Apply moisturizing lotion as directed Topical Primary Dressing PolyMem Non-Adhesive Dressing, 4x4 in Discharge Instruction: Apply to wound bed as instructed Secondary Dressing Optifoam Non-Adhesive Dressing, 4x4 in Discharge Instruction: Apply over primary dressing x1 layer foam donut. Woven Gauze Sponges 2x2 in Discharge Instruction: Apply over primary dressing as directed. Zetuvit Plus Silicone Border Dressing 4x4 (in/in) Discharge Instruction: Apply silicone border over primary dressing as directed. Secured With Compression Wrap Compression Stockings Facilities manager) Signed: 10/03/2023 5:04:26 PM By: Redmond Pulling RN, BSN Entered By: Redmond Pulling on 10/03/2023 12:52:14 -------------------------------------------------------------------------------- Vitals Details Patient Name: Date of Service: [place], [name] 10/03/2023 12:45 PM Medical Record Number: [id number] Patient Account Number: [id number] Date of Birth/Sex: Treating RN: 08-27-1933 (89 y.o. [name] Primary Care Becka Lagasse: [name] Other Clinician: Referring Nelida Mandarino: Treating Undra Harriman/Extender: Trevor Mace in Treatment: 19 Vital Signs Time Taken: 12:45 Temperature (F): 98.1 Height (in): 62 Pulse (bpm): 54 Weight (lbs): 105 Respiratory Rate (breaths/min): 18 Body Mass Index (BMI): 19.2 Blood Pressure (mmHg): 172/70 Reference Range: 80 - 120 mg / dl [name], [name] ([id number]) [id number] Page 9 of 9 Electronic Signature(s) Signed: 10/03/2023 5:04:26 PM By: Redmond Pulling RN, BSN Entered By: Redmond Pulling on 10/03/2023 12:48:32

## 2023-10-09 ENCOUNTER — Encounter (HOSPITAL_BASED_OUTPATIENT_CLINIC_OR_DEPARTMENT_OTHER): Payer: PPO | Admitting: Internal Medicine

## 2023-10-18 ENCOUNTER — Encounter (HOSPITAL_BASED_OUTPATIENT_CLINIC_OR_DEPARTMENT_OTHER): Payer: PPO | Admitting: Internal Medicine

## 2023-10-23 ENCOUNTER — Encounter (HOSPITAL_BASED_OUTPATIENT_CLINIC_OR_DEPARTMENT_OTHER): Payer: PPO | Attending: Internal Medicine | Admitting: Internal Medicine

## 2023-10-23 DIAGNOSIS — S91301A Unspecified open wound, right foot, initial encounter: Secondary | ICD-10-CM | POA: Insufficient documentation

## 2023-10-23 DIAGNOSIS — S91301D Unspecified open wound, right foot, subsequent encounter: Secondary | ICD-10-CM

## 2023-10-23 DIAGNOSIS — X58XXXA Exposure to other specified factors, initial encounter: Secondary | ICD-10-CM | POA: Diagnosis not present

## 2023-10-23 DIAGNOSIS — I10 Essential (primary) hypertension: Secondary | ICD-10-CM | POA: Diagnosis not present

## 2023-10-23 DIAGNOSIS — L89893 Pressure ulcer of other site, stage 3: Secondary | ICD-10-CM | POA: Insufficient documentation

## 2023-10-25 NOTE — Progress Notes (Signed)
 AHLIYA, GLATT ([id number]) [id number] Page 1 of 9 Visit Report for 10/23/2023 Chief Complaint Document Details Patient Name: Date of Service: [place], [name] 10/23/2023 3:30 PM Medical Record Number: [id number] Patient Account Number: [id number] Date of Birth/Sex: Treating RN: 1933-06-09 (89 y.o. F) Primary Care Provider: Hugh Lamar SAUNDERS Other Clinician: Referring Provider: Treating Provider/Extender: Rosan Harlene Hugh Lamar SAUNDERS Devra in Treatment: 21 Information Obtained from: Patient Chief Complaint Left LE Ulcer and right lateral foot pressure ulcer Electronic Signature(s) Signed: 10/24/2023 5:26:35 PM By: Rosan Harlene DO Entered By: Rosan Harlene on 10/23/2023 16:08:12 -------------------------------------------------------------------------------- HPI Details Patient Name: Date of Service: [place], [name] 10/23/2023 3:30 PM Medical Record Number: [id number] Patient Account Number: [id number] Date of Birth/Sex: Treating RN: 1932-12-03 (89 y.o. F) Primary Care Provider: Hugh Lamar SAUNDERS Other Clinician: Referring Provider: Treating Provider/Extender: Rosan Harlene Hugh Lamar SAUNDERS Devra in Treatment: 21 History of Present Illness HPI Description: READMISSION 07/15/2019 Patient is now an 89 year old woman who was previously here in 2016 and 2018. Cared for at the time by Dr. Britto. Both times with wounds related to trauma in the left leg. She was felt to have underlying venous insufficiency although both occasions were related to trauma. The patient tells me that 2 weeks ago she hit her lateral left leg on the car door. This was a skin tear with a flap for a period of time she has been using Polysporin. The flap came off recently she has a clean looking superficial wound on the left lateral leg. Our intake nurse reported some drainage. Past medical history; includes sensorineural hearing loss, osteoarthritis, hypertension and a hammertoe on  the right foot for which she follows with podiatry. ABIs in our clinic were 1.19 on the left 07/21/2019; the patient did not like the wraps. They slid down and rub the wound the wound is measuring larger she is upset. 10/12; left lateral leg wound. Most of this looks healthy even under illumination. We have been using Hydrofera Blue under border foam. She would not allow compression 10/19; left lateral leg wound. 2 small open areas remain of the original wound. We have been using Hydrofera Blue under border foam. This seems to be making decent progress 10/26 left lateral leg traumatic wound. There is no open wound remaining. We have been using Hydrofera Blue under border foam she arrived with some denuded epithelium that I removed there is no open wound remaining. Is fairly clear she has some degree of chronic venous insufficiency with not a lot of edema but dilated veins in her feet. She reminds me that she also has reactive arthritis and was on prednisone  for a prolonged period of time. Nevertheless I think it would be beneficial for her to at least wear support stockings but right now I do not think she is going to agree to the Readmission: 02/18/2020 upon evaluation today patient has sustained a skin tear which occurred about 1 week ago she tells me. Fortunately there does not appear to be any signs of active infection at this time which is excellent news. She has been tolerating the dressing changes without complication. Overall very pleased with where things stand at this point. The only issue that I see here is that the skin flap somewhat folded back and then reattached further down causing an area that is actually bunched up to form where it closed on itself but then reattached on the end of the tissue. Nonetheless I think we have to trim off the bunched up tissue  in order to allow this to heal appropriately. That is the only issue I really see today. 03/10/2020 upon evaluation today patient  actually appears to be doing excellent at this time. She has healed quite nicely and has been a couple of weeks since [place] ([id number]) [id number] Page 2 of 9 I saw her. Overall I feel like she is completely healed and ready for discharge as of today. Readmission: 03-21-2023 upon evaluation today patient appears to be doing somewhat poorly in regard to the left wound ulcer she tells me has been present for about a month. She tells me that she had this on the metal flatbed shopping carts at [facility] and subsequently has been having issues here with this since she tells me that the original Band-Aid she was using caused this to get worse. With that being said I do not see any evidence of active infection at this time everything seems to really be doing quite well as far as I am concerned. 03-28-2023 upon evaluation today patient appears to be doing well currently in regard to her wounds which in fact appear to be completely healed. I am very pleased with this. Readmission: 05-23-2023 upon evaluation patient presents for reevaluation here in the clinic concerning issues that she has been having with her left anterior lower extremity. This actually appears to have a significant amount of new skin growth over top of the areas of irritation I am not exactly sure what happened here to be perfectly honest. I explained to the patient that this is kind of an unusual presentation for this to be this irritated and yet not have any significant openings at this time. I do not see any evidence of active infection locally or systemically which is great news. 05-30-2023 upon evaluation today patient appears to be doing well currently in regard to her lower extremity wounds which are actually drying out the may be a little bit too dry. I think PolyMem may be better for her and we discussed that today. Fortunately I do not see any signs of active infection locally or systemically which is  great news. 06-06-2023 upon evaluation today patient appears to be doing well currently in regard to her wounds. Both are showing signs of improvement which is great news. Fortunately I do not see any signs of active infection locally or systemically at this time. 8/28; the distal wound on the left leg is healed she still has a comma shaped proximal wound medially. She reminds as she will be leaving for [place] on September night. The remaining wound is on the right upper medial calf a comma shaped wound this may be closed by that time. We had some discussion about compression stockings 06-20-2023 upon evaluation today patient appears to be doing well currently in regard to her wounds in fact most everything appears to be about healed although she does have some dry skin over top of the distal left lateral leg. The proximal medial leg is very close to closure and seems to be doing better. 07-11-2023 upon evaluation today patient appears to be doing well currently in regard to her wound. She has been tolerating the dressing changes without complication. Fortunately there does not appear to be any signs of infection locally or systemically which is great news. Unfortunately she has been having issues with colitis and she appears to be very pale today she also tells me that she has been feeling fairly poorly. Her rheumatologist to put her on prednisone  she is also  been put on antibiotics for the colitis. Her wounds on the left anterior lower extremity have reinitiated a little bit here and I am beginning to suspect this may be more autoimmune related. 10-to-24 upon evaluation today patient appears to be doing well currently at this point with regard to her wound. She has been tolerating the dressing changes without complication. Fortunately I do not see any signs of worsening overall I believe that the patient is making good headway towards complete closure which is great news. Still have a little concerned  about the possibility of her having some issues here with a rheumatologic or dermatologic issue. I do believe that the steroid ointment has done a little bit better for her the triamcinolone  I prescribed last week. 07-25-2023 upon evaluation today patient appears to be doing really about the same in regard to her wounds. The steroid ointment does not seem to make in a big difference unfortunately. Fortunately I do not see any signs of active infection locally or systemically which is good news. With that being said she does have 2 new pustules that have shown up she states they started looking like pimples initially and then have gotten worse since. With that being said I do believe that we need to see what we can do to try to get this under control she does have an appointment with dermatology next week in the meantime I am actually see about getting her started with a antibiotic ointment instead of the steroid. 08-01-2023 upon evaluation today patient appears to be doing well currently in regard to her left leg which is actually showing signs of improvement the antibiotic ointment actually seems to be doing quite well. I am actually very pleased with where we stand currently. Fortunately I do not see any signs of active infection locally or systemically at this time. No fevers, chills, nausea, vomiting, or diarrhea. 08-08-2023 upon evaluation today patient appears to be doing actually pretty well in regard to her wound compared to where we were previous. I do think that the erythema has improved the wound size is getting a little bit smaller as I see her each week and in general I think that we are on the right track. There is no signs of significant infection although there is erythema I really feel like this may be more pressure related as it is blanchable as opposed to infection. Nonetheless I been leery to go forward with doing an oral antibiotic due to the history with the GI upset that she had  more recently where she got very sick. Nonetheless if she is not significantly improved come next week we may need to consider going forward with the oral antibiotics just to be on the safe side for now we will continue topical mupirocin. 08-15-2023 upon evaluation today patient appears to be doing well currently in regard to her wound. She is actually showing signs of good improvement she does have some slough and biofilm noted we will get a going perform some debridement to clear away the slough and biofilm down to good subcutaneous tissue my hope is this will help to get the area to heal much more effectively and quickly with the collagen. 11/6; patient has an open wound at the base of her fifth metatarsal on the right lateral foot. Small punched-out area with a nonviable surface some erythema around the wound which almost looks like a chronic bursitis rather than infection. She has previously had wounds on the left leg which are apparently  healed 11/13; a wound at the base of her fifth metatarsal on the right lateral foot. This may actually represent a bunion deformity. Last week she had a punched-out area with nonviable surface I removed with a curette. We have been using Iodoflex to clean up the wound bed. She said this hurt for 2 or 3 days but it is settled down now. She is changing the dressing herself. 11/20; fifth metatarsal base at the right lateral foot. I think this is a chronic bursitis type deformity/bunion deformity. We used Iodoflex up until now. Hope we will be changed to endoform today. No debridement. 12/3; right fifth metatarsal base. On almost a bunion like injury. Surrounding callus in this area in the middle of which is a open wound. We had been using Iodoflex however that this caused irritation so we changed to endoform. The wound looks better today there is no surrounding infection. We were considering going to an advanced tissue product either new shield or Puraply. 12/11;  right fifth metatarsal base. Looks like a bunion type deformity to me. The wound is come in fairly nicely. Still an oval-shaped area I am uncertain whether there is epithelialization in the majority of this or whether this is slough I was reluctant debride this today. We have been using endoform with improvement. Consider using polymen. 12/18; right fifth metatarsal base. Again I I really think this is a bunion type deformity. Careful expect inspection of the wound shows about 40% of this is still open 60% epithelialized. She still has rims of callus around this worrisome for continued pressure and/or friction in this area. We have been using endoform. I am going to change back to polymen today 10/23/2023; patient presents for follow-up. She has been using PolyMem to the wound bed. She denies signs of infection. The wound has been present for over 3 months. She reports chronic pain to the area. Electronic Signature(s) [place], [place] L ([id number]) [id number] Page 3 of 9 Signed: 10/24/2023 5:26:35 PM By: Rosan Raisin DO Entered By: Rosan Raisin on 10/23/2023 16:12:59 -------------------------------------------------------------------------------- Physical Exam Details Patient Name: Date of Service: [place], [name] 10/23/2023 3:30 PM Medical Record Number: [id number] Patient Account Number: [id number] Date of Birth/Sex: Treating RN: 1933-09-04 (89 y.o. F) Primary Care Provider: Hugh Lamar SAUNDERS Other Clinician: Referring Provider: Treating Provider/Extender: Rosan Raisin Hugh Lamar SAUNDERS Devra in Treatment: 21 Constitutional respirations regular, non-labored and within target range for patient.. Cardiovascular 2+ dorsalis pedis/posterior tibialis pulses. Psychiatric pleasant and cooperative. Notes T the base of the right fifth metatarsal there is an open wound with granulation tissue and minimal nonviable tissue. No surrounding signs of infection.  Mild o tenderness on palpation. No increased warmth, erythema or purulent drainage. Electronic Signature(s) Signed: 10/24/2023 5:26:35 PM By: Rosan Raisin DO Entered By: Rosan Raisin on 10/23/2023 16:10:25 -------------------------------------------------------------------------------- Physician Orders Details Patient Name: Date of Service: [place], [name] 10/23/2023 3:30 PM Medical Record Number: [id number] Patient Account Number: [id number] Date of Birth/Sex: Treating RN: 10-08-1933 (89 y.o. JEANELL Sever, [name] Primary Care Provider: Hugh Lamar SAUNDERS Other Clinician: Referring Provider: Treating Provider/Extender: Rosan Raisin Hugh Lamar SAUNDERS Devra in Treatment: 21 Verbal / Phone Orders: No Diagnosis Coding ICD-10 Coding Code Description [phone] Pressure ulcer of other site, stage 3 I10 Essential (primary) hypertension S91.301D Unspecified open wound, right foot, subsequent encounter Follow-up Appointments ppointment in 1 week. - Dr. 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 BAELYN, DORING ([id number]) [id number] Page 4 of 9 Edema Control - Orders / Instructions Elevate legs to the level of the heart or above for 30 minutes daily and/or when sitting for 3-4 times a day throughout the day. Avoid standing for long periods of time. Wound Treatment Wound #10 - Foot Wound Laterality: Right, Lateral Cleanser: Soap and Water Every Other Day/30 Days Discharge Instructions: May shower and wash wound with dial antibacterial soap and water prior to dressing change. Peri-Wound Care: Sween Lotion (Moisturizing lotion) Every Other Day/30 Days Discharge Instructions: Apply moisturizing lotion as  directed Prim Dressing: Hydrofera Blue Ready Transfer Foam, 4x5 (in/in) Every Other Day/30 Days ary Discharge Instructions: Apply to wound bed as instructed Prim Dressing: MediHoney Gel, tube 1.5 (oz) Every Other Day/30 Days ary Discharge Instructions: Apply to wound bed as instructed Secondary Dressing: Optifoam Non-Adhesive Dressing, 4x4 in Every Other Day/30 Days Discharge Instructions: Apply over primary dressing x1 layer foam donut. Secondary Dressing: Woven Gauze Sponges 2x2 in (Generic) Every Other Day/30 Days Discharge Instructions: Apply over primary dressing as directed. Secondary Dressing: Zetuvit Plus Silicone Border Dressing 4x4 (in/in) (Generic) Every Other Day/30 Days Discharge Instructions: Apply silicone border over primary dressing as directed. Radiology X-ray, foot - right foot; looking for infection Electronic Signature(s) Signed: 10/24/2023 5:26:35 PM By: Rosan Raisin DO Entered By: Rosan Raisin on 10/23/2023 16:10:43 Prescription 10/23/2023 -------------------------------------------------------------------------------- CLAUDENE RONAL L. Rosan Raisin DO Patient Name: Provider: Oct 26, 1932 [id number] Date of Birth: NPI#: [id number] Sex: DEA #: [phone] Phone #: License #: ROBERTO: Patient Address: [address] [facility] W[place], [place] [address] [address] [place], [place] [address] [phone] Allergies Sulfa  (Sulfonamide Antibiotics); latex Provider's Orders X-ray, foot - right foot; looking for infection Hand Signature: Date(s): Electronic Signature(s) Signed: 10/24/2023 5:26:35 PM By: Rosan Raisin DO Entered By: Rosan Raisin on 10/23/2023 16:10:43 CLAUDENE RONAL CROME ([id number]) [id number] Page 5 of 9 -------------------------------------------------------------------------------- Problem List Details Patient Name: Date of Service: [place], [name]  10/23/2023 3:30 PM Medical Record Number: [id number] Patient Account Number: [id number] Date of Birth/Sex: Treating RN: 18-Apr-1933 (89 y.o. F) Primary Care Provider: Hugh Lamar SAUNDERS Other Clinician: Referring Provider: Treating Provider/Extender: Rosan Raisin Hugh Lamar SAUNDERS Devra in Treatment: 21 Active Problems ICD-10 Encounter Code Description Active Date MDM Diagnosis L89.893 Pressure ulcer of other site, stage 3 07/11/2023 No Yes I10 Essential (primary) hypertension 05/23/2023 No Yes S91.301D Unspecified open wound, right foot, subsequent encounter 10/23/2023 No Yes Inactive Problems ICD-10 Code Description Active Date Inactive Date I87.332 Chronic venous hypertension (idiopathic) with ulcer and inflammation of left lower 05/23/2023 05/23/2023 extremity L97.822 Non-pressure chronic ulcer of other part of left lower leg with fat layer exposed 05/23/2023 05/23/2023 O02.177 Non-pressure chronic ulcer of other part of left lower leg with fat layer exposed 05/23/2023 05/23/2023 Resolved Problems Electronic Signature(s) Signed: 10/24/2023 5:26:35 PM By: Rosan Raisin DO Entered By: Rosan Raisin on 10/23/2023 16:07:20 -------------------------------------------------------------------------------- Progress Note Details Patient Name: Date of Service: [place] HONOR LEILA [place]ED[place] 10/23/2023 3:30 PM Medical Record Number: [id number] Patient Account Number: [id number] Date of Birth/Sex: Treating RN: 06/20/1933 (89 y.o. F) Primary Care Provider: Hugh Lamar SAUNDERS Other Clinician: Referring Provider: Treating Provider/Extender: Rosan Raisin Hugh Lamar SAUNDERS Devra in Treatment: [address] [place] L ([id number]) [id number] Page 6 of 9 Chief Complaint Information obtained from Patient Left LE Ulcer and right  lateral foot pressure ulcer History of Present Illness (HPI) READMISSION 07/15/2019 Patient is now an 89 year old woman who was previously here in 2016 and  2018. Cared for at the time by Dr. Britto. Both times with wounds related to trauma in the left leg. She was felt to have underlying venous insufficiency although both occasions were related to trauma. The patient tells me that 2 weeks ago she hit her lateral left leg on the car door. This was a skin tear with a flap for a period of time she has been using Polysporin. The flap came off recently she has a clean looking superficial wound on the left lateral leg. Our intake nurse reported some drainage. Past medical history; includes sensorineural hearing loss, osteoarthritis, hypertension and a hammertoe on the right foot for which she follows with podiatry. ABIs in our clinic were 1.19 on the left 07/21/2019; the patient did not like the wraps. They slid down and rub the wound the wound is measuring larger she is upset. 10/12; left lateral leg wound. Most of this looks healthy even under illumination. We have been using Hydrofera Blue under border foam. She would not allow compression 10/19; left lateral leg wound. 2 small open areas remain of the original wound. We have been using Hydrofera Blue under border foam. This seems to be making decent progress 10/26 left lateral leg traumatic wound. There is no open wound remaining. We have been using Hydrofera Blue under border foam she arrived with some denuded epithelium that I removed there is no open wound remaining. Is fairly clear she has some degree of chronic venous insufficiency with not a lot of edema but dilated veins in her feet. She reminds me that she also has reactive arthritis and was on prednisone  for a prolonged period of time. Nevertheless I think it would be beneficial for her to at least wear support stockings but right now I do not think she is going to agree to the Readmission: 02/18/2020 upon evaluation today patient has sustained a skin tear which occurred about 1 week ago she tells me. Fortunately there does not appear to be  any signs of active infection at this time which is excellent news. She has been tolerating the dressing changes without complication. Overall very pleased with where things stand at this point. The only issue that I see here is that the skin flap somewhat folded back and then reattached further down causing an area that is actually bunched up to form where it closed on itself but then reattached on the end of the tissue. Nonetheless I think we have to trim off the bunched up tissue in order to allow this to heal appropriately. That is the only issue I really see today. 03/10/2020 upon evaluation today patient actually appears to be doing excellent at this time. She has healed quite nicely and has been a couple of weeks since I saw her. Overall I feel like she is completely healed and ready for discharge as of today. Readmission: 03-21-2023 upon evaluation today patient appears to be doing somewhat poorly in regard to the left wound ulcer she tells me has been present for about a month. She tells me that she had this on the metal flatbed shopping carts at [facility] and subsequently has been having issues here with this since she tells me that the original Band-Aid she was using caused this to get worse. With that being said I do not see any evidence of active infection at this time everything  seems to really be doing quite well as far as I am concerned. 03-28-2023 upon evaluation today patient appears to be doing well currently in regard to her wounds which in fact appear to be completely healed. I am very pleased with this. Readmission: 05-23-2023 upon evaluation patient presents for reevaluation here in the clinic concerning issues that she has been having with her left anterior lower extremity. This actually appears to have a significant amount of new skin growth over top of the areas of irritation I am not exactly sure what happened here to be perfectly honest. I explained to the patient that this is  kind of an unusual presentation for this to be this irritated and yet not have any significant openings at this time. I do not see any evidence of active infection locally or systemically which is great news. 05-30-2023 upon evaluation today patient appears to be doing well currently in regard to her lower extremity wounds which are actually drying out the may be a little bit too dry. I think PolyMem may be better for her and we discussed that today. Fortunately I do not see any signs of active infection locally or systemically which is great news. 06-06-2023 upon evaluation today patient appears to be doing well currently in regard to her wounds. Both are showing signs of improvement which is great news. Fortunately I do not see any signs of active infection locally or systemically at this time. 8/28; the distal wound on the left leg is healed she still has a comma shaped proximal wound medially. She reminds as she will be leaving for [place] on September night. The remaining wound is on the right upper medial calf a comma shaped wound this may be closed by that time. We had some discussion about compression stockings 06-20-2023 upon evaluation today patient appears to be doing well currently in regard to her wounds in fact most everything appears to be about healed although she does have some dry skin over top of the distal left lateral leg. The proximal medial leg is very close to closure and seems to be doing better. 07-11-2023 upon evaluation today patient appears to be doing well currently in regard to her wound. She has been tolerating the dressing changes without complication. Fortunately there does not appear to be any signs of infection locally or systemically which is great news. Unfortunately she has been having issues with colitis and she appears to be very pale today she also tells me that she has been feeling fairly poorly. Her rheumatologist to put her on prednisone  she is also been put on  antibiotics for the colitis. Her wounds on the left anterior lower extremity have reinitiated a little bit here and I am beginning to suspect this may be more autoimmune related. 10-to-24 upon evaluation today patient appears to be doing well currently at this point with regard to her wound. She has been tolerating the dressing changes without complication. Fortunately I do not see any signs of worsening overall I believe that the patient is making good headway towards complete closure which is great news. Still have a little concerned about the possibility of her having some issues here with a rheumatologic or dermatologic issue. I do believe that the steroid ointment has done a little bit better for her the triamcinolone  I prescribed last week. 07-25-2023 upon evaluation today patient appears to be doing really about the same in regard to her wounds. The steroid ointment does not seem to make in a big  difference unfortunately. Fortunately I do not see any signs of active infection locally or systemically which is good news. With that being said she does have 2 new pustules that have shown up she states they started looking like pimples initially and then have gotten worse since. With that being said I do believe that we need to see what we can do to try to get this under control she does have an appointment with dermatology next week in the meantime I am actually see about getting her started with a antibiotic ointment instead of the steroid. 08-01-2023 upon evaluation today patient appears to be doing well currently in regard to her left leg which is actually showing signs of improvement the antibiotic ointment actually seems to be doing quite well. I am actually very pleased with where we stand currently. Fortunately I do not see any signs of active infection locally or systemically at this time. No fevers, chills, nausea, vomiting, or diarrhea. SHALIYAH, TAITE ([id number])  [id number] Page 7 of 9 08-08-2023 upon evaluation today patient appears to be doing actually pretty well in regard to her wound compared to where we were previous. I do think that the erythema has improved the wound size is getting a little bit smaller as I see her each week and in general I think that we are on the right track. There is no signs of significant infection although there is erythema I really feel like this may be more pressure related as it is blanchable as opposed to infection. Nonetheless I been leery to go forward with doing an oral antibiotic due to the history with the GI upset that she had more recently where she got very sick. Nonetheless if she is not significantly improved come next week we may need to consider going forward with the oral antibiotics just to be on the safe side for now we will continue topical mupirocin. 08-15-2023 upon evaluation today patient appears to be doing well currently in regard to her wound. She is actually showing signs of good improvement she does have some slough and biofilm noted we will get a going perform some debridement to clear away the slough and biofilm down to good subcutaneous tissue my hope is this will help to get the area to heal much more effectively and quickly with the collagen. 11/6; patient has an open wound at the base of her fifth metatarsal on the right lateral foot. Small punched-out area with a nonviable surface some erythema around the wound which almost looks like a chronic bursitis rather than infection. She has previously had wounds on the left leg which are apparently healed 11/13; a wound at the base of her fifth metatarsal on the right lateral foot. This may actually represent a bunion deformity. Last week she had a punched-out area with nonviable surface I removed with a curette. We have been using Iodoflex to clean up the wound bed. She said this hurt for 2 or 3 days but it is settled  down now. She is changing the dressing herself. 11/20; fifth metatarsal base at the right lateral foot. I think this is a chronic bursitis type deformity/bunion deformity. We used Iodoflex up until now. Hope we will be changed to endoform today. No debridement. 12/3; right fifth metatarsal base. On almost a bunion like injury. Surrounding callus in this area in the middle of which is a open wound. We had been using Iodoflex however that this caused irritation so we changed to endoform. The wound looks  better today there is no surrounding infection. We were considering going to an advanced tissue product either new shield or Puraply. 12/11; right fifth metatarsal base. Looks like a bunion type deformity to me. The wound is come in fairly nicely. Still an oval-shaped area I am uncertain whether there is epithelialization in the majority of this or whether this is slough I was reluctant debride this today. We have been using endoform with improvement. Consider using polymen. 12/18; right fifth metatarsal base. Again I I really think this is a bunion type deformity. Careful expect inspection of the wound shows about 40% of this is still open 60% epithelialized. She still has rims of callus around this worrisome for continued pressure and/or friction in this area. We have been using endoform. I am going to change back to polymen today 10/23/2023; patient presents for follow-up. She has been using PolyMem to the wound bed. She denies signs of infection. The wound has been present for over 3 months. She reports chronic pain to the area. Objective Constitutional respirations regular, non-labored and within target range for patient.. Vitals Time Taken: 3:33 PM, Height: 62 in, Weight: 105 lbs, BMI: 19.2, Temperature: 97.8 F, Pulse: 51 bpm, Respiratory Rate: 18 breaths/min, Blood Pressure: 150/79 mmHg. Cardiovascular 2+ dorsalis pedis/posterior tibialis pulses. Psychiatric pleasant and cooperative. General  Notes: T the base of the right fifth metatarsal there is an open wound with granulation tissue and minimal nonviable tissue. No surrounding signs of o infection. Mild tenderness on palpation. No increased warmth, erythema or purulent drainage. Integumentary (Hair, Skin) Wound #10 status is Open. Original cause of wound was Gradually Appeared. The date acquired was: 06/19/2023. The wound has been in treatment 14 weeks. The wound is located on the Right,Lateral Foot. The wound measures 0.5cm length x 0.5cm width x 0.1cm depth; 0.196cm^2 area and 0.02cm^3 volume. There is Fat Layer (Subcutaneous Tissue) exposed. There is no tunneling or undermining noted. There is a medium amount of serosanguineous drainage noted. The wound margin is distinct with the outline attached to the wound base. There is small (1-33%) red granulation within the wound bed. There is a large (67-100%) amount of necrotic tissue within the wound bed including Adherent Slough. The periwound skin appearance had no abnormalities noted for moisture. The periwound skin appearance had no abnormalities noted for color. The periwound skin appearance exhibited: Callus. The periwound skin appearance did not exhibit: Crepitus, Excoriation, Induration, Rash, Scarring. Periwound temperature was noted as No Abnormality. Assessment Active Problems ICD-10 Pressure ulcer of other site, stage 3 Essential (primary) hypertension Unspecified open wound, right foot, subsequent encounter Patient's wound is stable. She has tried a variety of different wound care dressings with minimal improvement in her healing. The wound has been present for PROMYSE, ARDITO ([id number]) [phone].pdf Page 8 of 9 over 3 months. At this time I recommended an x-ray of the foot to assess for any bony destruction. I will go ahead and switch the primary dressing to Hydrofera Blue with Medihoney. We discussed the importance of offloading to this area. She  has seen orthopedic surgery for potential shaving of the bone to address the issue and help with her wound healing however she would does not want to proceed with surgery. Plan Follow-up Appointments: Return Appointment in 1 week. - Dr. Rosan Anesthetic: (In clinic) Topical Lidocaine  4% applied to wound bed Cellular or Tissue Based Products: Other Cellular or Tissue Based Products Orders/Instructions: - insurance auth for [employer] from [place]: May shower and wash  wound with soap and water. Edema Control - Orders / Instructions: Elevate legs to the level of the heart or above for 30 minutes daily and/or when sitting for 3-4 times a day throughout the day. Avoid standing for long periods of time. Radiology ordered were: X-ray, foot - right foot; looking for infection WOUND #10: - Foot Wound Laterality: Right, Lateral Cleanser: Soap and Water Every Other Day/30 Days Discharge Instructions: May shower and wash wound with dial antibacterial soap and water prior to dressing change. Peri-Wound Care: Sween Lotion (Moisturizing lotion) Every Other Day/30 Days Discharge Instructions: Apply moisturizing lotion as directed Prim Dressing: Hydrofera Blue Ready Transfer Foam, 4x5 (in/in) Every Other Day/30 Days ary Discharge Instructions: Apply to wound bed as instructed Prim Dressing: MediHoney Gel, tube 1.5 (oz) Every Other Day/30 Days ary Discharge Instructions: Apply to wound bed as instructed Secondary Dressing: Optifoam Non-Adhesive Dressing, 4x4 in Every Other Day/30 Days Discharge Instructions: Apply over primary dressing x1 layer foam donut. Secondary Dressing: Woven Gauze Sponges 2x2 in (Generic) Every Other Day/30 Days Discharge Instructions: Apply over primary dressing as directed. Secondary Dressing: Zetuvit Plus Silicone Border Dressing 4x4 (in/in) (Generic) Every Other Day/30 Days Discharge Instructions: Apply silicone border over primary dressing as  directed. 1. Medihoney and Hydrofera Blue 2. X-ray of the right foot 3. Follow-up in 1 week Electronic Signature(s) Signed: 10/24/2023 5:26:35 PM By: Rosan Raisin DO Entered By: Rosan Raisin on 10/23/2023 16:15:16 -------------------------------------------------------------------------------- SuperBill Details Patient Name: Date of Service: [place], [name] 10/23/2023 Medical Record Number: [id number] Patient Account Number: [id number] Date of Birth/Sex: Treating RN: Feb 13, 1933 (89 y.o. JEANELL Sever, [name] Primary Care Provider: Hugh Lamar SAUNDERS Other Clinician: Referring Provider: Treating Provider/Extender: Rosan Raisin Hugh Lamar SAUNDERS Devra in Treatment: 21 Diagnosis Coding ICD-10 Codes Code Description 4074467265 Pressure ulcer of other site, stage 3 I10 Essential (primary) hypertension S91.301D Unspecified open wound, right foot, subsequent encounter Facility Procedures : CPT4 Code: [id number] Description: 99213 - WOUND CARE VISIT-LEV 3 EST PT Modifier: Quantity: 1 Physician Procedures : CPT4 Code Description Modifier ERNESTYNE, CALDWELL ([id number]) [id number] 99213 - WC PHYS LEVEL 3 - EST PT 1 ICD-10 Diagnosis Description L89.893 Pressure ulcer of other site, stage 3 I10 Essential (primary) hypertension  S91.301D Unspecified open wound, right foot, subsequent encounter Quantity: .pdf Page 9 of 9 Electronic Signature(s) Signed: 10/24/2023 5:26:35 PM By: Rosan Raisin DO Entered By: Rosan Raisin on 10/23/2023 16:15:31

## 2023-10-31 ENCOUNTER — Encounter (HOSPITAL_BASED_OUTPATIENT_CLINIC_OR_DEPARTMENT_OTHER): Payer: PPO | Admitting: Internal Medicine

## 2023-10-31 ENCOUNTER — Other Ambulatory Visit: Payer: Self-pay | Admitting: Internal Medicine

## 2023-10-31 ENCOUNTER — Ambulatory Visit
Admission: RE | Admit: 2023-10-31 | Discharge: 2023-10-31 | Disposition: A | Discharge status: Home / Self Care | Payer: PPO | Source: Ambulatory Visit | Attending: Internal Medicine | Admitting: Internal Medicine

## 2023-10-31 DIAGNOSIS — M79671 Pain in right foot: Secondary | ICD-10-CM

## 2023-10-31 DIAGNOSIS — S91301D Unspecified open wound, right foot, subsequent encounter: Secondary | ICD-10-CM

## 2023-10-31 DIAGNOSIS — L89893 Pressure ulcer of other site, stage 3: Secondary | ICD-10-CM | POA: Diagnosis not present

## 2023-10-31 DIAGNOSIS — I10 Essential (primary) hypertension: Secondary | ICD-10-CM | POA: Diagnosis not present

## 2023-10-31 NOTE — Progress Notes (Signed)
 [name], [name] ([id number]) [id number] Page 1 of 9 Visit Report for 10/23/2023 Arrival Information Details Patient Name: Date of Service: [place], [name] 10/23/2023 3:30 PM Medical Record Number: [id number] Patient Account Number: [id number] Date of Birth/Sex: Treating RN: May 23, 1933 (89 y.o. F) Primary Care Ndeye Tenorio: Hugh Lamar SAUNDERS Other Clinician: Referring Newton Frutiger: Treating Kaamil Morefield/Extender: Rosan Harlene Hugh Lamar SAUNDERS Devra in Treatment: 21 Visit Information History Since Last Visit Added or deleted any medications: No Patient Arrived: Ambulatory Any new allergies or adverse reactions: No Arrival Time: 15:27 Had a fall or experienced change in No Accompanied By: self activities of daily living that may affect Transfer Assistance: None risk of falls: Patient Identification Verified: Yes Signs or symptoms of abuse/neglect since last visito No Secondary Verification Process Completed: Yes Hospitalized since last visit: No Patient Requires Transmission-Based Precautions: No Implantable device outside of the clinic excluding No Patient Has Alerts: No cellular tissue based products placed in the center since last visit: Has Dressing in Place as Prescribed: Yes Pain Present Now: No Electronic Signature(s) Signed: 10/29/2023 5:23:50 PM By: Wyn Iha Entered By: Wyn Iha on 10/23/2023 15:28:05 -------------------------------------------------------------------------------- Clinic Level of Care Assessment Details Patient Name: Date of Service: [name][name] 10/23/2023 3:30 PM Medical Record Number: [id number] Patient Account Number: [id number] Date of Birth/Sex: Treating RN: [birth year] (89 y.o. JEANELL Sever, [name] Primary Care Nilan Iddings: Hugh Lamar SAUNDERS Other Clinician: Referring Rolondo Pierre: Treating Annasofia Pohl/Extender: Rosan Harlene Hugh Lamar SAUNDERS Devra in Treatment: 21 Clinic Level of Care Assessment Items TOOL 4 Quantity  Score X- 1 0 Use when only an EandM is performed on FOLLOW-UP visit ASSESSMENTS - Nursing Assessment / Reassessment X- 1 10 Reassessment of Co-morbidities (includes updates in patient status) X- 1 5 Reassessment of Adherence to Treatment Plan ASSESSMENTS - Wound and Skin A ssessment / Reassessment X - Simple Wound Assessment / Reassessment - one wound 1 5 []  - 0 Complex Wound Assessment / Reassessment - multiple wounds []  - 0 Dermatologic / Skin Assessment (not related to wound area) ASSESSMENTS - Focused Assessment X- 1 5 Circumferential Edema Measurements - multi extremities []  - 0 Nutritional Assessment / Counseling / Intervention [name], [name] ([id number]) [id number] Page 2 of 9 []  - 0 Lower Extremity Assessment (monofilament, tuning fork, pulses) []  - 0 Peripheral Arterial Disease Assessment (using hand held doppler) ASSESSMENTS - Ostomy and/or Continence Assessment and Care []  - 0 Incontinence Assessment and Management []  - 0 Ostomy Care Assessment and Management (repouching, etc.) PROCESS - Coordination of Care X - Simple Patient / Family Education for ongoing care 1 15 []  - 0 Complex (extensive) Patient / Family Education for ongoing care X- 1 10 Staff obtains Chiropractor, Records, T Results / Process Orders est []  - 0 Staff telephones HHA, Nursing Homes / Clarify orders / etc []  - 0 Routine Transfer to another Facility (non-emergent condition) []  - 0 Routine Hospital Admission (non-emergent condition) []  - 0 New Admissions / Manufacturing Engineer / Ordering NPWT Apligraf, etc. , []  - 0 Emergency Hospital Admission (emergent condition) X- 1 10 Simple Discharge Coordination []  - 0 Complex (extensive) Discharge Coordination PROCESS - Special Needs []  - 0 Pediatric / Minor Patient Management []  - 0 Isolation Patient Management []  - 0 Hearing / Language / Visual special needs []  - 0 Assessment of Community assistance  (transportation, D/C planning, etc.) []  - 0 Additional assistance / Altered mentation []  - 0 Support Surface(s) Assessment (bed, cushion, seat, etc.) INTERVENTIONS - Wound Cleansing / Measurement X - Simple Wound Cleansing -  one wound 1 5 []  - 0 Complex Wound Cleansing - multiple wounds X- 1 5 Wound Imaging (photographs - any number of wounds) []  - 0 Wound Tracing (instead of photographs) X- 1 5 Simple Wound Measurement - one wound []  - 0 Complex Wound Measurement - multiple wounds INTERVENTIONS - Wound Dressings X - Small Wound Dressing one or multiple wounds 1 10 []  - 0 Medium Wound Dressing one or multiple wounds []  - 0 Large Wound Dressing one or multiple wounds X- 1 5 Application of Medications - topical []  - 0 Application of Medications - injection INTERVENTIONS - Miscellaneous []  - 0 External ear exam []  - 0 Specimen Collection (cultures, biopsies, blood, body fluids, etc.) []  - 0 Specimen(s) / Culture(s) sent or taken to Lab for analysis []  - 0 Patient Transfer (multiple staff / Nurse, Adult / Similar devices) []  - 0 Simple Staple / Suture removal (25 or less) []  - 0 Complex Staple / Suture removal (26 or more) []  - 0 Hypo / Hyperglycemic Management (close monitor of Blood Glucose) [name] ([id number]) [id number] Page 3 of 9 []  - 0 Ankle / Brachial Index (ABI) - do not check if billed separately X- 1 5 Vital Signs Has the patient been seen at the hospital within the last three years: Yes Total Score: 95 Level Of Care: New/Established - Level 3 Electronic Signature(s) Signed: 10/23/2023 4:20:27 PM By: [name] Maxwell RN Entered By: [name] on 10/23/2023 16:02:46 -------------------------------------------------------------------------------- Encounter Discharge Information Details Patient Name: Date of Service: [place], [name] 10/23/2023 3:30 PM Medical Record Number: [id number] Patient Account Number:  [id number] Date of Birth/Sex: Treating RN: April 22, 1933 (89 y.o. JEANELL [name], [name] Primary Care Estelene Carmack: Hugh Lamar SAUNDERS Other Clinician: Referring Shelbie Franken: Treating Elva Mauro/Extender: Rosan Harlene Hugh Lamar SAUNDERS Devra in Treatment: 21 Encounter Discharge Information Items Discharge Condition: Stable Ambulatory Status: Ambulatory Discharge Destination: Home Transportation: Private Auto Accompanied By: self Schedule Follow-up Appointment: Yes Clinical Summary of Care: Patient Declined Electronic Signature(s) Signed: 10/23/2023 4:20:27 PM By: [name] Maxwell RN Entered By: [name] on 10/23/2023 16:04:16 -------------------------------------------------------------------------------- Lower Extremity Assessment Details Patient Name: Date of Service: [place], [name] 10/23/2023 3:30 PM Medical Record Number: [id number] Patient Account Number: [id number] Date of Birth/Sex: Treating RN: [birth year] (89 y.o. F) Primary Care Teshia Mahone: Hugh Lamar SAUNDERS Other Clinician: Referring Conda Wannamaker: Treating Fallynn Gravett/Extender: Rosan Harlene Hugh Lamar SAUNDERS Devra in Treatment: 21 Edema Assessment Assessed: [Left: No] [Right: No] Edema: [Left: N] [Right: o] Calf Left: Right: Point of Measurement: From Medial Instep 29.7 cm Ankle Left: Right: Point of Measurement: From Medial Instep 16.6 cm Vascular Assessment ROXANNA, MCEVER [name] ([id number]) [Right:[id number] Page 4 of 9] Extremity colors, hair growth, and conditions: Extremity Color: [Right:Hyperpigmented] Hair Growth on Extremity: [Right:No] Temperature of Extremity: [Right:Cold] Capillary Refill: [Right:< 3 seconds] Dependent Rubor: [Right:No No] Electronic Signature(s) Signed: 10/29/2023 5:23:50 PM By: Wyn Iha Entered By: Wyn Iha on 10/23/2023 15:32:37 -------------------------------------------------------------------------------- Multi Wound Chart Details Patient Name: Date of  Service: [place], [name] 10/23/2023 3:30 PM Medical Record Number: [id number] Patient Account Number: [id number] Date of Birth/Sex: Treating RN: Oct 29, 1932 (89 y.o. F) Primary Care Lash Matulich: Hugh Lamar SAUNDERS Other Clinician: Referring Ledford Goodson: Treating Zulay Corrie/Extender: Rosan Harlene Hugh Lamar SAUNDERS Devra in Treatment: 21 Vital Signs Height(in): 62 Pulse(bpm): 51 Weight(lbs): 105 Blood Pressure(mmHg): 150/79 Body Mass Index(BMI): 19.2 Temperature(F): 97.8 Respiratory Rate(breaths/min): 18 [10:Photos:] [N/A:N/A] Right, Lateral Foot N/A N/A Wound Location: Gradually Appeared N/A N/A Wounding Event: Pressure Ulcer N/A N/A Primary Etiology: Cataracts, Hypertension, Raynauds, N/A N/A  Comorbid History: Rheumatoid Arthritis, Osteoarthritis 06/19/2023 N/A N/A Date Acquired: 14 N/A N/A Weeks of Treatment: Open N/A N/A Wound Status: No N/A N/A Wound Recurrence: 0.5x0.5x0.1 N/A N/A Measurements [name] x W x D (cm) 0.196 N/A N/A A (cm) : rea 0.02 N/A N/A Volume (cm) : 40.60% N/A N/A % Reduction in A rea: 69.70% N/A N/A % Reduction in Volume: Category/Stage III N/A N/A Classification: Medium N/A N/A Exudate A mount: Serosanguineous N/A N/A Exudate Type: red, brown N/A N/A Exudate Color: Distinct, outline attached N/A N/A Wound Margin: Small (1-33%) N/A N/A Granulation A mount: Red N/A N/A Granulation Quality: Large (67-100%) N/A N/A Necrotic A mount: Fat Layer (Subcutaneous Tissue): Yes N/A N/A Exposed Structures: Fascia: No Tendon: No Muscle: No Joint: No Bone: No Small (1-33%) N/A N/A Epithelialization: Callus: Yes N/A N/A Periwound Skin Texture: Excoriation: No KASHONDA, SARKISYAN ([id number]) [phone].pdf Page 5 of 9 Induration: No Crepitus: No Rash: No Scarring: No Dry/Scaly: Yes N/A N/A Periwound Skin Moisture: Maceration: No Atrophie Blanche: No N/A N/A Periwound Skin Color: Cyanosis: No Ecchymosis: No Erythema:  No Hemosiderin Staining: No Mottled: No Pallor: No Rubor: No No Abnormality N/A N/A Temperature: Treatment Notes Wound #10 (Foot) Wound Laterality: Right, Lateral Cleanser Soap and Water Discharge Instruction: May shower and wash wound with dial antibacterial soap and water prior to dressing change. Peri-Wound Care Sween Lotion (Moisturizing lotion) Discharge Instruction: Apply moisturizing lotion as directed Topical Primary Dressing Hydrofera Blue Ready Transfer Foam, 4x5 (in/in) Discharge Instruction: Apply to wound bed as instructed MediHoney Gel, tube 1.5 (oz) Discharge Instruction: Apply to wound bed as instructed Secondary Dressing Optifoam Non-Adhesive Dressing, 4x4 in Discharge Instruction: Apply over primary dressing x1 layer foam donut. Woven Gauze Sponges 2x2 in Discharge Instruction: Apply over primary dressing as directed. Zetuvit Plus Silicone Border Dressing 4x4 (in/in) Discharge Instruction: Apply silicone border over primary dressing as directed. Secured With Compression Wrap Compression Stockings Facilities Manager) Signed: 10/24/2023 5:26:35 PM By: Rosan Raisin DO Entered By: Rosan Raisin on 10/23/2023 16:07:25 -------------------------------------------------------------------------------- Multi-Disciplinary Care Plan Details Patient Name: [place] of [name] 10/23/2023 3:30 PM Medical Record Number: [id number] Patient Account Number: [id number] Date of Birth/Sex: Treating RN: 05-27-1933 (89 y.o. JEANELL Sever, [name] Primary Care Miabella Shannahan: Hugh Lamar SAUNDERS Other Clinician: Referring Dashun Borre: Treating Jazmaine Fuelling/Extender: Rosan Raisin Hugh Lamar SAUNDERS Devra in Treatment: 21 Multidisciplinary Care Plan reviewed with physician ALORA, GOREY ([id number]) [id number] Page 6 of 9 Active Inactive Wound/Skin Impairment Nursing Diagnoses: Impaired tissue integrity Knowledge deficit related to  ulceration/compromised skin integrity Goals: Patient/caregiver will verbalize understanding of skin care regimen Date Initiated: 05/23/2023 Target Resolution Date: 11/17/2023 Goal Status: Active Ulcer/skin breakdown will have a volume reduction of 30% by week 4 Date Initiated: 05/23/2023 Date Inactivated: 08/22/2023 Target Resolution Date: 08/20/2023 Unmet Reason: see wound Goal Status: Unmet measurement. Interventions: Assess patient/caregiver ability to obtain necessary supplies Assess patient/caregiver ability to perform ulcer/skin care regimen upon admission and as needed Assess ulceration(s) every visit Provide education on ulcer and skin care Treatment Activities: Skin care regimen initiated : 05/23/2023 Topical wound management initiated : 05/23/2023 Notes: Electronic Signature(s) Signed: 10/23/2023 3:39:43 PM By: Sever Maxwell RN Entered By: Sever [name] on 10/23/2023 15:39:42 -------------------------------------------------------------------------------- Pain Assessment Details Patient Name: Date of Service: [place], [place]. 10/23/2023 3:30 PM Medical Record Number: [id number] Patient Account Number: [id number] Date of Birth/Sex: Treating RN: 1932-12-20 (89 y.o. F) Primary Care Gailen Venne: Hugh Lamar SAUNDERS Other Clinician: Referring Deidrick Rainey: Treating Kamin Niblack/Extender: Rosan Raisin Hugh Lamar SAUNDERS Devra in Treatment:  21 Active Problems Location of Pain Severity and Description of Pain Patient Has Paino No Site Locations Pain Management and Medication Current Pain Management: CHERIL, SLATTERY ([id number]) [phone].pdf Page 7 of 9 Electronic Signature(s) Signed: 10/29/2023 5:23:50 PM By: Wyn Iha Entered By: Wyn Iha on 10/23/2023 15:28:19 -------------------------------------------------------------------------------- Patient/Caregiver Education Details Patient Name: Date of Service: [place] 1/7/2025andnbsp3:30 PM Medical  Record Number: [id number] Patient Account Number: [id number] Date of Birth/Gender: Treating RN: 01/07/1933 (89 y.o. JEANELL [name] Primary Care Physician: Hugh Lamar SAUNDERS Other Clinician: Referring Physician: Treating Physician/Extender: Rosan Harlene Hugh Lamar SAUNDERS Devra in Treatment: 21 Education Assessment Education Provided To: Patient Education Topics Provided Wound/Skin Impairment: Methods: Explain/Verbal Responses: Reinforcements needed, State content correctly [employer]) Signed: 10/23/2023 4:20:27 PM By: [name] Maxwell RN Entered By: [name] on 10/23/2023 15:40:03 -------------------------------------------------------------------------------- Wound Assessment Details Patient Name: Date of Service: [place], [name] 10/23/2023 3:30 PM Medical Record Number: [id number] Patient Account Number: [id number] Date of Birth/Sex: Treating RN: [birth year] (89 y.o. F) Primary Care Jadynn Epping: Hugh Lamar SAUNDERS Other Clinician: Referring Hyun Marsalis: Treating Taysia Rivere/Extender: Rosan Harlene Hugh Lamar SAUNDERS Devra in Treatment: 21 Wound Status Wound Number: 10 Primary Pressure Ulcer Etiology: Wound Location: Right, Lateral Foot Wound Status: Open Wounding Event: Gradually Appeared Comorbid Cataracts, Hypertension, Raynauds, Rheumatoid Arthritis, Date Acquired: 06/19/2023 History: Osteoarthritis Weeks Of Treatment: 14 Clustered Wound: No Photos JAYLEEN, SCAGLIONE ([id number]) [id number] Page 8 of 9 Wound Measurements Length: (cm) 0.5 Width: (cm) 0.5 Depth: (cm) 0.1 Area: (cm) 0.196 Volume: (cm) 0.02 % Reduction in Area: 40.6% % Reduction in Volume: 69.7% Epithelialization: Small (1-33%) Tunneling: No Undermining: No Wound Description Classification: Category/Stage III Wound Margin: Distinct, outline attached Exudate Amount: Medium Exudate Type: Serosanguineous Exudate Color: red, brown Foul Odor After Cleansing:  No Slough/Fibrino Yes Wound Bed Granulation Amount: Small (1-33%) Exposed Structure Granulation Quality: Red Fascia Exposed: No Necrotic Amount: Large (67-100%) Fat Layer (Subcutaneous Tissue) Exposed: Yes Necrotic Quality: Adherent Slough Tendon Exposed: No Muscle Exposed: No Joint Exposed: No Bone Exposed: No Periwound Skin Texture Texture Color No Abnormalities Noted: No No Abnormalities Noted: Yes Callus: Yes Temperature / Pain Crepitus: No Temperature: No Abnormality Excoriation: No Induration: No Rash: No Scarring: No Moisture No Abnormalities Noted: Yes Treatment Notes Wound #10 (Foot) Wound Laterality: Right, Lateral Cleanser Soap and Water Discharge Instruction: May shower and wash wound with dial antibacterial soap and water prior to dressing change. Peri-Wound Care Sween Lotion (Moisturizing lotion) Discharge Instruction: Apply moisturizing lotion as directed Topical Primary Dressing Hydrofera Blue Ready Transfer Foam, 4x5 (in/in) Discharge Instruction: Apply to wound bed as instructed MediHoney Gel, tube 1.5 (oz) Discharge Instruction: Apply to wound bed as instructed Secondary Dressing Optifoam Non-Adhesive Dressing, 4x4 in Discharge Instruction: Apply over primary dressing x1 layer foam donut. Woven Gauze Sponges 2x2 in Discharge Instruction: Apply over primary dressing as directed. ASHERAH, LAVOY ([id number]) [id number] Page 9 of 9 Zetuvit Plus Silicone Border Dressing 4x4 (in/in) Discharge Instruction: Apply silicone border over primary dressing as directed. Secured With Compression Wrap Compression Stockings Facilities Manager) Signed: 10/23/2023 4:20:27 PM By: [name] Maxwell RN Entered By: [name] on 10/23/2023 15:35:28 -------------------------------------------------------------------------------- Vitals Details Patient Name: Date of Service: [place], [name] 10/23/2023 3:30 PM Medical Record  Number: [id number] Patient Account Number: [id number] Date of Birth/Sex: Treating RN: 1932/12/08 (89 y.o. F) Primary Care Monette Omara: Hugh Lamar SAUNDERS Other Clinician: Referring Annya Lizana: Treating Jacon Whetzel/Extender: Rosan Harlene Hugh Lamar SAUNDERS Devra in Treatment: 21 Vital Signs Time Taken: 15:33 Temperature (F): 97.8 Height (in):  62 Pulse (bpm): 51 Weight (lbs): 105 Respiratory Rate (breaths/min): 18 Body Mass Index (BMI): 19.2 Blood Pressure (mmHg): 150/79 Reference Range: 80 - 120 mg / dl Electronic Signature(s) Signed: 10/29/2023 5:23:50 PM By: Wyn Iha Entered By: Wyn Iha on 10/23/2023 15:33:26

## 2023-11-01 NOTE — Progress Notes (Signed)
[name], [name] ([id number]) [id number] Page 1 of 8 Visit Report for 10/31/2023 Chief Complaint Document Details Patient Name: Date of Service: [name], [name] 10/31/2023 10:15 A M Medical Record Number: [id number] Patient Account Number: [id number] Date of Birth/Sex: Treating RN: [birth year] (89 y.o. F) Primary Care Provider: [name] Other Clinician: Referring Provider: Treating Provider/Extender: Bertrum Sol in Treatment: 23 Information Obtained from: Patient Chief Complaint Left LE Ulcer and right lateral foot pressure ulcer Electronic Signature(s) Signed: 10/31/2023 4:27:59 PM By: Geralyn Corwin DO Entered By: Geralyn Corwin on 10/31/2023 12:27:27 -------------------------------------------------------------------------------- HPI Details Patient Name: Date of Service: [name], [name] 10/31/2023 10:15 A M Medical Record Number: [id number] Patient Account Number: [id number] Date of Birth/Sex: Treating RN: 03/16/1933 (89 y.o. F) Primary Care Provider: [name] Other Clinician: Referring Provider: Treating Provider/Extender: Bertrum Sol in Treatment: 23 History of Present Illness HPI Description: READMISSION 07/15/2019 Patient is now an 89 year old woman who was previously here in 2016 and 2018. Cared for at the time by Dr. Meyer Russel. Both times with wounds related to trauma in the left leg. She was felt to have underlying venous insufficiency although both occasions were related to trauma. The patient tells me that 2 weeks ago she hit her lateral left leg on the car door. This was a skin tear with a flap for a period of time she has been using Polysporin. The flap came off recently she has a clean looking superficial wound on the left lateral leg. Our intake nurse reported some drainage. Past medical history; includes sensorineural hearing loss, osteoarthritis, hypertension and a  hammertoe on the right foot for which she follows with podiatry. ABIs in our clinic were 1.19 on the left 07/21/2019; the patient did not like the wraps. They slid down and rub the wound the wound is measuring larger she is upset. 10/12; left lateral leg wound. Most of this looks healthy even under illumination. We have been using Hydrofera Blue under border foam. She would not allow compression 10/19; left lateral leg wound. 2 small open areas remain of the original wound. We have been using Hydrofera Blue under border foam. This seems to be making decent progress 10/26 left lateral leg traumatic wound. There is no open wound remaining. We have been using Hydrofera Blue under border foam she arrived with some denuded epithelium that I removed there is no open wound remaining. Is fairly clear she has some degree of chronic venous insufficiency with not a lot of edema but dilated veins in her feet. She reminds me that she also has reactive arthritis and was on prednisone for a prolonged period of time. Nevertheless I think it would be beneficial for her to at least wear support stockings but right now I do not think she is going to agree to the Readmission: 02/18/2020 upon evaluation today patient has sustained a skin tear which occurred about 1 week ago she tells me. Fortunately there does not appear to be any signs of active infection at this time which is excellent news. She has been tolerating the dressing changes without complication. Overall very pleased with where things stand at this point. The only issue that I see here is that the skin flap somewhat folded back and then reattached further down causing an area that is actually bunched up to form where it closed on itself but then reattached on the end of the tissue. Nonetheless I think we have to trim off the bunched  up tissue in order to allow this to heal appropriately. That is the only issue I really see today. 03/10/2020 upon evaluation  today patient actually appears to be doing excellent at this time. She has healed quite nicely and has been a couple of weeks since [name] ([id number]) [phone].pdf Page 2 of 8 I saw her. Overall I feel like she is completely healed and ready for discharge as of today. Readmission: 03-21-2023 upon evaluation today patient appears to be doing somewhat poorly in regard to the left wound ulcer she tells me has been present for about a month. She tells me that she had this on the metal flatbed shopping carts at [facility] and subsequently has been having issues here with this since she tells me that the original Band-Aid she was using caused this to get worse. With that being said I do not see any evidence of active infection at this time everything seems to really be doing quite well as far as I am concerned. 03-28-2023 upon evaluation today patient appears to be doing well currently in regard to her wounds which in fact appear to be completely healed. I am very pleased with this. Readmission: 05-23-2023 upon evaluation patient presents for reevaluation here in the clinic concerning issues that she has been having with her left anterior lower extremity. This actually appears to have a significant amount of new skin growth over top of the areas of irritation I am not exactly sure what happened here to be perfectly honest. I explained to the patient that this is kind of an unusual presentation for this to be this irritated and yet not have any significant openings at this time. I do not see any evidence of active infection locally or systemically which is great news. 05-30-2023 upon evaluation today patient appears to be doing well currently in regard to her lower extremity wounds which are actually drying out the may be a little bit too dry. I think PolyMem may be better for her and we discussed that today. Fortunately I do not see any signs of active infection locally  or systemically which is great news. 06-06-2023 upon evaluation today patient appears to be doing well currently in regard to her wounds. Both are showing signs of improvement which is great news. Fortunately I do not see any signs of active infection locally or systemically at this time. 8/28; the distal wound on the left leg is healed she still has a comma shaped proximal wound medially. She reminds as she will be leaving for [place] on September night. The remaining wound is on the right upper medial calf a comma shaped wound this may be closed by that time. We had some discussion about compression stockings 06-20-2023 upon evaluation today patient appears to be doing well currently in regard to her wounds in fact most everything appears to be about healed although she does have some dry skin over top of the distal left lateral leg. The proximal medial leg is very close to closure and seems to be doing better. 07-11-2023 upon evaluation today patient appears to be doing well currently in regard to her wound. She has been tolerating the dressing changes without complication. Fortunately there does not appear to be any signs of infection locally or systemically which is great news. Unfortunately she has been having issues with colitis and she appears to be very pale today she also tells me that she has been feeling fairly poorly. Her rheumatologist to put her on prednisone she  is also been put on antibiotics for the colitis. Her wounds on the left anterior lower extremity have reinitiated a little bit here and I am beginning to suspect this may be more autoimmune related. 10-to-24 upon evaluation today patient appears to be doing well currently at this point with regard to her wound. She has been tolerating the dressing changes without complication. Fortunately I do not see any signs of worsening overall I believe that the patient is making good headway towards complete closure which is great news. Still  have a little concerned about the possibility of her having some issues here with a rheumatologic or dermatologic issue. I do believe that the steroid ointment has done a little bit better for her the triamcinolone I prescribed last week. 07-25-2023 upon evaluation today patient appears to be doing really about the same in regard to her wounds. The steroid ointment does not seem to make in a big difference unfortunately. Fortunately I do not see any signs of active infection locally or systemically which is good news. With that being said she does have 2 new pustules that have shown up she states they started looking like pimples initially and then have gotten worse since. With that being said I do believe that we need to see what we can do to try to get this under control she does have an appointment with dermatology next week in the meantime I am actually see about getting her started with a antibiotic ointment instead of the steroid. 08-01-2023 upon evaluation today patient appears to be doing well currently in regard to her left leg which is actually showing signs of improvement the antibiotic ointment actually seems to be doing quite well. I am actually very pleased with where we stand currently. Fortunately I do not see any signs of active infection locally or systemically at this time. No fevers, chills, nausea, vomiting, or diarrhea. 08-08-2023 upon evaluation today patient appears to be doing actually pretty well in regard to her wound compared to where we were previous. I do think that the erythema has improved the wound size is getting a little bit smaller as I see her each week and in general I think that we are on the right track. There is no signs of significant infection although there is erythema I really feel like this may be more pressure related as it is blanchable as opposed to infection. Nonetheless I been leery to go forward with doing an oral antibiotic due to the history with the  GI upset that she had more recently where she got very sick. Nonetheless if she is not significantly improved come next week we may need to consider going forward with the oral antibiotics just to be on the safe side for now we will continue topical mupirocin. 08-15-2023 upon evaluation today patient appears to be doing well currently in regard to her wound. She is actually showing signs of good improvement she does have some slough and biofilm noted we will get a going perform some debridement to clear away the slough and biofilm down to good subcutaneous tissue my hope is this will help to get the area to heal much more effectively and quickly with the collagen. 11/6; patient has an open wound at the base of her fifth metatarsal on the right lateral foot. Small punched-out area with a nonviable surface some erythema around the wound which almost looks like a chronic bursitis rather than infection. She has previously had wounds on the left leg which  are apparently healed 11/13; a wound at the base of her fifth metatarsal on the right lateral foot. This may actually represent a bunion deformity. Last week she had a punched-out area with nonviable surface I removed with a curette. We have been using Iodoflex to clean up the wound bed. She said this hurt for 2 or 3 days but it is settled down now. She is changing the dressing herself. 11/20; fifth metatarsal base at the right lateral foot. I think this is a chronic bursitis type deformity/bunion deformity. We used Iodoflex up until now. Hope we will be changed to endoform today. No debridement. 12/3; right fifth metatarsal base. On almost a bunion like injury. Surrounding callus in this area in the middle of which is a open wound. We had been using Iodoflex however that this caused irritation so we changed to endoform. The wound looks better today there is no surrounding infection. We were considering going to an advanced tissue product either new  shield or Puraply. 12/11; right fifth metatarsal base. Looks like a bunion type deformity to me. The wound is come in fairly nicely. Still an oval-shaped area I am uncertain whether there is epithelialization in the majority of this or whether this is slough I was reluctant debride this today. We have been using endoform with improvement. Consider using polymen. 12/18; right fifth metatarsal base. Again I I really think this is a bunion type deformity. Careful expect inspection of the wound shows about 40% of this is still open 60% epithelialized. She still has rims of callus around this worrisome for continued pressure and/or friction in this area. We have been using endoform. I am going to change back to polymen today 10/23/2023; patient presents for follow-up. She has been using PolyMem to the wound bed. She denies signs of infection. The wound has been present for over 3 months. She reports chronic pain to the area. 10/31/2023; patient presents for follow-up. The dressing was changed to Medihoney and Hydrofera Blue at last clinic visit. She tolerated this change well. She forgot to obtain her x-ray of the right foot. We gave her a new order today to have this done. She denies signs of infection but still has chronic pain to the [name] ([id number]) [phone].pdf Page 3 of 8 wound site. Electronic Signature(s) Signed: 10/31/2023 4:27:59 PM By: Geralyn Corwin DO Entered By: Geralyn Corwin on 10/31/2023 12:28:13 -------------------------------------------------------------------------------- Physical Exam Details Patient Name: Date of Service: [place], [name] 10/31/2023 10:15 A M Medical Record Number: [id number] Patient Account Number: [id number] Date of Birth/Sex: Treating RN: 03-21-1933 (89 y.o. F) Primary Care Provider: [name] Other Clinician: Referring Provider: Treating Provider/Extender: Bertrum Sol in Treatment:  23 Constitutional respirations regular, non-labored and within target range for patient.. Cardiovascular 2+ dorsalis pedis/posterior tibialis pulses. Psychiatric pleasant and cooperative. Notes T the base of the right fifth metatarsal there is an open wound with granulation tissue and minimal nonviable tissue. No surrounding signs of infection. Mild o tenderness on palpation. No increased warmth, erythema or purulent drainage. Electronic Signature(s) Signed: 10/31/2023 4:27:59 PM By: Geralyn Corwin DO Entered By: Geralyn Corwin on 10/31/2023 12:28:46 -------------------------------------------------------------------------------- Physician Orders Details Patient Name: Date of Service: [place], [name] 10/31/2023 10:15 A M Medical Record Number: [id number] Patient Account Number: [id number] Date of Birth/Sex: Treating RN: [birth year] (89 y.o. [name] Primary Care Provider: [name] Other Clinician: Referring Provider: Treating Provider/Extender: Bertrum Sol in Treatment: 23 The  following information was scribed by: Midge Aver The information was scribed for: Geralyn Corwin Verbal / Phone Orders: No Diagnosis Coding Follow-up Appointments ppointment in 1 week. - Dr. Mikey Bussing one week Return A Anesthetic (In clinic) Topical Lidocaine 4% applied to wound bed Cellular or Tissue Based Products Other Cellular or Tissue Based Products Orders/Instructions: - insurance auth for [employer] from [employer] May shower and wash wound with soap and water. [name], [name] ([id number]) [id number] Page 4 of 8 Edema Control - Orders / Instructions Elevate legs to the level of the heart or above for 30 minutes daily and/or when sitting for 3-4 times a day throughout the day. Avoid standing for long periods of time. Wound Treatment Wound #10 - Foot Wound Laterality: Right, Lateral Cleanser: Soap  and Water Every Other Day/30 Days Discharge Instructions: May shower and wash wound with dial antibacterial soap and water prior to dressing change. Peri-Wound Care: Sween Lotion (Moisturizing lotion) Every Other Day/30 Days Discharge Instructions: Apply moisturizing lotion as directed Prim Dressing: Hydrofera Blue Ready Transfer Foam, 4x5 (in/in) Every Other Day/30 Days ary Discharge Instructions: Apply to wound bed as instructed Prim Dressing: MediHoney Gel, tube 1.5 (oz) Every Other Day/30 Days ary Discharge Instructions: Apply to wound bed as instructed Secondary Dressing: Optifoam Non-Adhesive Dressing, 4x4 in Every Other Day/30 Days Discharge Instructions: Apply over primary dressing x1 layer foam donut. Secondary Dressing: Woven Gauze Sponges 2x2 in (Generic) Every Other Day/30 Days Discharge Instructions: Apply over primary dressing as directed. Secondary Dressing: Zetuvit Plus Silicone Border Dressing 4x4 (in/in) (Generic) Every Other Day/30 Days Discharge Instructions: Apply silicone border over primary dressing as directed. Electronic Signature(s) Signed: 10/31/2023 4:27:59 PM By: Geralyn Corwin DO Entered By: Geralyn Corwin on 10/31/2023 12:28:54 -------------------------------------------------------------------------------- Problem List Details Patient Name: Date of Service: [place], [name] 10/31/2023 10:15 A M Medical Record Number: [id number] Patient Account Number: [id number] Date of Birth/Sex: Treating RN: 1933/04/19 (89 y.o. F) Primary Care Provider: [name] Other Clinician: Referring Provider: Treating Provider/Extender: Bertrum Sol in Treatment: 23 Active Problems ICD-10 Encounter Code Description Active Date MDM Diagnosis L89.893 Pressure ulcer of other site, stage 3 07/11/2023 No Yes I10 Essential (primary) hypertension 05/23/2023 No Yes S91.301D Unspecified open wound, right foot, subsequent encounter 10/23/2023 No  Yes Inactive Problems ICD-10 Code Description Active Date Inactive Date I87.332 Chronic venous hypertension (idiopathic) with ulcer and inflammation of left lower 05/23/2023 05/23/2023 extremity [name], [name] ([id number]) [phone].pdf Page 5 of 8 [phone] Non-pressure chronic ulcer of other part of left lower leg with fat layer exposed 05/23/2023 05/23/2023 E33.295 Non-pressure chronic ulcer of other part of left lower leg with fat layer exposed 05/23/2023 05/23/2023 Resolved Problems Electronic Signature(s) Signed: 10/31/2023 4:27:59 PM By: Geralyn Corwin DO Entered By: Geralyn Corwin on 10/31/2023 12:26:53 -------------------------------------------------------------------------------- Progress Note Details Patient Name: Date of Service: [place], [name] [place]. 10/31/2023 10:15 A M Medical Record Number: [id number] Patient Account Number: [id number] Date of Birth/Sex: Treating RN: Nov 14, 1932 (89 y.o. F) Primary Care Provider: [name] Other Clinician: Referring Provider: Treating Provider/Extender: Bertrum Sol in Treatment: 23 Subjective Chief Complaint Information obtained from Patient Left LE Ulcer and right lateral foot pressure ulcer History of Present Illness (HPI) READMISSION 07/15/2019 Patient is now an 89 year old woman who was previously here in 2016 and 2018. Cared for at the time by Dr. Meyer Russel. Both times with wounds related to trauma in the left leg. She was felt to have underlying venous insufficiency although  both occasions were related to trauma. The patient tells me that 2 weeks ago she hit her lateral left leg on the car door. This was a skin tear with a flap for a period of time she has been using Polysporin. The flap came off recently she has a clean looking superficial wound on the left lateral leg. Our intake nurse reported some drainage. Past medical history; includes sensorineural hearing loss, osteoarthritis,  hypertension and a hammertoe on the right foot for which she follows with podiatry. ABIs in our clinic were 1.19 on the left 07/21/2019; the patient did not like the wraps. They slid down and rub the wound the wound is measuring larger she is upset. 10/12; left lateral leg wound. Most of this looks healthy even under illumination. We have been using Hydrofera Blue under border foam. She would not allow compression 10/19; left lateral leg wound. 2 small open areas remain of the original wound. We have been using Hydrofera Blue under border foam. This seems to be making decent progress 10/26 left lateral leg traumatic wound. There is no open wound remaining. We have been using Hydrofera Blue under border foam she arrived with some denuded epithelium that I removed there is no open wound remaining. Is fairly clear she has some degree of chronic venous insufficiency with not a lot of edema but dilated veins in her feet. She reminds me that she also has reactive arthritis and was on prednisone for a prolonged period of time. Nevertheless I think it would be beneficial for her to at least wear support stockings but right now I do not think she is going to agree to the Readmission: 02/18/2020 upon evaluation today patient has sustained a skin tear which occurred about 1 week ago she tells me. Fortunately there does not appear to be any signs of active infection at this time which is excellent news. She has been tolerating the dressing changes without complication. Overall very pleased with where things stand at this point. The only issue that I see here is that the skin flap somewhat folded back and then reattached further down causing an area that is actually bunched up to form where it closed on itself but then reattached on the end of the tissue. Nonetheless I think we have to trim off the bunched up tissue in order to allow this to heal appropriately. That is the only issue I really see today. 03/10/2020  upon evaluation today patient actually appears to be doing excellent at this time. She has healed quite nicely and has been a couple of weeks since I saw her. Overall I feel like she is completely healed and ready for discharge as of today. Readmission: 03-21-2023 upon evaluation today patient appears to be doing somewhat poorly in regard to the left wound ulcer she tells me has been present for about a month. She tells me that she had this on the metal flatbed shopping carts at [facility] and subsequently has been having issues here with this since she tells me that the original Band-Aid she was using caused this to get worse. With that being said I do not see any evidence of active infection at this time everything seems to really be doing quite well as far as I am concerned. 03-28-2023 upon evaluation today patient appears to be doing well currently in regard to her wounds which in fact appear to be completely healed. I am very pleased with this. Readmission: [name], [name] ([id number]) [phone].pdf Page 6 of 8  05-23-2023 upon evaluation patient presents for reevaluation here in the clinic concerning issues that she has been having with her left anterior lower extremity. This actually appears to have a significant amount of new skin growth over top of the areas of irritation I am not exactly sure what happened here to be perfectly honest. I explained to the patient that this is kind of an unusual presentation for this to be this irritated and yet not have any significant openings at this time. I do not see any evidence of active infection locally or systemically which is great news. 05-30-2023 upon evaluation today patient appears to be doing well currently in regard to her lower extremity wounds which are actually drying out the may be a little bit too dry. I think PolyMem may be better for her and we discussed that today. Fortunately I do not see any signs of active infection  locally or systemically which is great news. 06-06-2023 upon evaluation today patient appears to be doing well currently in regard to her wounds. Both are showing signs of improvement which is great news. Fortunately I do not see any signs of active infection locally or systemically at this time. 8/28; the distal wound on the left leg is healed she still has a comma shaped proximal wound medially. She reminds as she will be leaving for [place] on September night. The remaining wound is on the right upper medial calf a comma shaped wound this may be closed by that time. We had some discussion about compression stockings 06-20-2023 upon evaluation today patient appears to be doing well currently in regard to her wounds in fact most everything appears to be about healed although she does have some dry skin over top of the distal left lateral leg. The proximal medial leg is very close to closure and seems to be doing better. 07-11-2023 upon evaluation today patient appears to be doing well currently in regard to her wound. She has been tolerating the dressing changes without complication. Fortunately there does not appear to be any signs of infection locally or systemically which is great news. Unfortunately she has been having issues with colitis and she appears to be very pale today she also tells me that she has been feeling fairly poorly. Her rheumatologist to put her on prednisone she is also been put on antibiotics for the colitis. Her wounds on the left anterior lower extremity have reinitiated a little bit here and I am beginning to suspect this may be more autoimmune related. 10-to-24 upon evaluation today patient appears to be doing well currently at this point with regard to her wound. She has been tolerating the dressing changes without complication. Fortunately I do not see any signs of worsening overall I believe that the patient is making good headway towards complete closure which is great  news. Still have a little concerned about the possibility of her having some issues here with a rheumatologic or dermatologic issue. I do believe that the steroid ointment has done a little bit better for her the triamcinolone I prescribed last week. 07-25-2023 upon evaluation today patient appears to be doing really about the same in regard to her wounds. The steroid ointment does not seem to make in a big difference unfortunately. Fortunately I do not see any signs of active infection locally or systemically which is good news. With that being said she does have 2 new pustules that have shown up she states they started looking like pimples initially and then have  gotten worse since. With that being said I do believe that we need to see what we can do to try to get this under control she does have an appointment with dermatology next week in the meantime I am actually see about getting her started with a antibiotic ointment instead of the steroid. 08-01-2023 upon evaluation today patient appears to be doing well currently in regard to her left leg which is actually showing signs of improvement the antibiotic ointment actually seems to be doing quite well. I am actually very pleased with where we stand currently. Fortunately I do not see any signs of active infection locally or systemically at this time. No fevers, chills, nausea, vomiting, or diarrhea. 08-08-2023 upon evaluation today patient appears to be doing actually pretty well in regard to her wound compared to where we were previous. I do think that the erythema has improved the wound size is getting a little bit smaller as I see her each week and in general I think that we are on the right track. There is no signs of significant infection although there is erythema I really feel like this may be more pressure related as it is blanchable as opposed to infection. Nonetheless I been leery to go forward with doing an oral antibiotic due to the  history with the GI upset that she had more recently where she got very sick. Nonetheless if she is not significantly improved come next week we may need to consider going forward with the oral antibiotics just to be on the safe side for now we will continue topical mupirocin. 08-15-2023 upon evaluation today patient appears to be doing well currently in regard to her wound. She is actually showing signs of good improvement she does have some slough and biofilm noted we will get a going perform some debridement to clear away the slough and biofilm down to good subcutaneous tissue my hope is this will help to get the area to heal much more effectively and quickly with the collagen. 11/6; patient has an open wound at the base of her fifth metatarsal on the right lateral foot. Small punched-out area with a nonviable surface some erythema around the wound which almost looks like a chronic bursitis rather than infection. She has previously had wounds on the left leg which are apparently healed 11/13; a wound at the base of her fifth metatarsal on the right lateral foot. This may actually represent a bunion deformity. Last week she had a punched-out area with nonviable surface I removed with a curette. We have been using Iodoflex to clean up the wound bed. She said this hurt for 2 or 3 days but it is settled down now. She is changing the dressing herself. 11/20; fifth metatarsal base at the right lateral foot. I think this is a chronic bursitis type deformity/bunion deformity. We used Iodoflex up until now. Hope we will be changed to endoform today. No debridement. 12/3; right fifth metatarsal base. On almost a bunion like injury. Surrounding callus in this area in the middle of which is a open wound. We had been using Iodoflex however that this caused irritation so we changed to endoform. The wound looks better today there is no surrounding infection. We were considering going to an advanced tissue product  either new shield or Puraply. 12/11; right fifth metatarsal base. Looks like a bunion type deformity to me. The wound is come in fairly nicely. Still an oval-shaped area I am uncertain whether there is epithelialization in  the majority of this or whether this is slough I was reluctant debride this today. We have been using endoform with improvement. Consider using polymen. 12/18; right fifth metatarsal base. Again I I really think this is a bunion type deformity. Careful expect inspection of the wound shows about 40% of this is still open 60% epithelialized. She still has rims of callus around this worrisome for continued pressure and/or friction in this area. We have been using endoform. I am going to change back to polymen today 10/23/2023; patient presents for follow-up. She has been using PolyMem to the wound bed. She denies signs of infection. The wound has been present for over 3 months. She reports chronic pain to the area. 10/31/2023; patient presents for follow-up. The dressing was changed to Medihoney and Hydrofera Blue at last clinic visit. She tolerated this change well. She forgot to obtain her x-ray of the right foot. We gave her a new order today to have this done. She denies signs of infection but still has chronic pain to the wound site. [address] [name], [name] ([id number]) [id number] Page 7 of 8 Constitutional respirations regular, non-labored and within target range for patient.. Vitals Time Taken: 10:33 AM, Height: 62 in, Weight: 105 lbs, BMI: 19.2, Temperature: 98.1 F, Pulse: 53 bpm, Respiratory Rate: 18 breaths/min, Blood Pressure: 152/61 mmHg. Cardiovascular 2+ dorsalis pedis/posterior tibialis pulses. Psychiatric pleasant and cooperative. General Notes: T the base of the right fifth metatarsal there is an open wound with granulation tissue and minimal nonviable tissue. No surrounding signs of o infection. Mild tenderness on palpation. No  increased warmth, erythema or purulent drainage. Integumentary (Hair, Skin) Wound #10 status is Open. Original cause of wound was Gradually Appeared. The date acquired was: 06/19/2023. The wound has been in treatment 16 weeks. The wound is located on the Right,Lateral Foot. The wound measures 0.4cm length x 0.5cm width x 0.1cm depth; 0.157cm^2 area and 0.016cm^3 volume. There is Fat Layer (Subcutaneous Tissue) exposed. There is no tunneling or undermining noted. There is a medium amount of serosanguineous drainage noted. The wound margin is distinct with the outline attached to the wound base. There is small (1-33%) red granulation within the wound bed. There is a large (67-100%) amount of necrotic tissue within the wound bed including Adherent Slough. The periwound skin appearance had no abnormalities noted for moisture. The periwound skin appearance had no abnormalities noted for color. The periwound skin appearance exhibited: Callus. The periwound skin appearance did not exhibit: Crepitus, Excoriation, Induration, Rash, Scarring. Periwound temperature was noted as No Abnormality. Assessment Active Problems ICD-10 Pressure ulcer of other site, stage 3 Essential (primary) hypertension Unspecified open wound, right foot, subsequent encounter Patient's wound is stable with slightly more healthy granulation tissue today. I recommended continuing the course with Medihoney and Hydrofera Blue and offloading. I recommended she obtain the x-ray to the right foot to assess further if there is any bone involvement preventing her wound healing. Currently no signs of soft tissue infection. Follow-up in 1 week. Plan Follow-up Appointments: Return Appointment in 1 week. - Dr. Mikey Bussing one week Anesthetic: (In clinic) Topical Lidocaine 4% applied to wound bed Cellular or Tissue Based Products: Other Cellular or Tissue Based Products Orders/Instructions: - insurance auth for Nushield from  [employer]: May shower and wash wound with soap and water. Edema Control - Orders / Instructions: Elevate legs to the level of the heart or above for 30 minutes daily and/or when sitting for 3-4 times a day throughout the  day. Avoid standing for long periods of time. WOUND #10: - Foot Wound Laterality: Right, Lateral Cleanser: Soap and Water Every Other Day/30 Days Discharge Instructions: May shower and wash wound with dial antibacterial soap and water prior to dressing change. Peri-Wound Care: Sween Lotion (Moisturizing lotion) Every Other Day/30 Days Discharge Instructions: Apply moisturizing lotion as directed Prim Dressing: Hydrofera Blue Ready Transfer Foam, 4x5 (in/in) Every Other Day/30 Days ary Discharge Instructions: Apply to wound bed as instructed Prim Dressing: MediHoney Gel, tube 1.5 (oz) Every Other Day/30 Days ary Discharge Instructions: Apply to wound bed as instructed Secondary Dressing: Optifoam Non-Adhesive Dressing, 4x4 in Every Other Day/30 Days Discharge Instructions: Apply over primary dressing x1 layer foam donut. Secondary Dressing: Woven Gauze Sponges 2x2 in (Generic) Every Other Day/30 Days Discharge Instructions: Apply over primary dressing as directed. Secondary Dressing: Zetuvit Plus Silicone Border Dressing 4x4 (in/in) (Generic) Every Other Day/30 Days Discharge Instructions: Apply silicone border over primary dressing as directed. 1. Medihoney and Hydrofera Blue 2. Aggressive offloading 3. X-ray of the right foot 4. Follow-up in 1 week Electronic Signature(s) Signed: 10/31/2023 4:27:59 PM By: Duane Boston, Virgia Land ([id number]) [id number] Page 8 of 8 Entered By: Geralyn Corwin on 10/31/2023 12:39:24 -------------------------------------------------------------------------------- SuperBill Details Patient Name: Date of Service: [place], [name] 10/31/2023 Medical Record Number:  [id number] Patient Account Number: [id number] Date of Birth/Sex: Treating RN: 1933/08/13 (89 y.o. [name] Primary Care Provider: [name] Other Clinician: Referring Provider: Treating Provider/Extender: Bertrum Sol in Treatment: 23 Diagnosis Coding ICD-10 Codes Code Description [phone] Pressure ulcer of other site, stage 3 I10 Essential (primary) hypertension S91.301D Unspecified open wound, right foot, subsequent encounter Facility Procedures : CPT4 Code: [id number] Description: 99213 - WOUND CARE VISIT-LEV 3 EST PT Modifier: Quantity: 1 Physician Procedures : CPT4 Code Description Modifier [id number] - WC PHYS LEVEL 3 - EST PT ICD-10 Diagnosis Description L89.893 Pressure ulcer of other site, stage 3 I10 Essential (primary) hypertension S91.301D Unspecified open wound, right foot, subsequent encounter Quantity: 1 Electronic Signature(s) Signed: 10/31/2023 4:27:59 PM By: Geralyn Corwin DO Entered By: Geralyn Corwin on 10/31/2023 12:39:50

## 2023-11-04 ENCOUNTER — Other Ambulatory Visit: Payer: Self-pay | Admitting: Obstetrics and Gynecology

## 2023-11-04 DIAGNOSIS — Z79899 Other long term (current) drug therapy: Secondary | ICD-10-CM

## 2023-11-05 NOTE — Telephone Encounter (Signed)
Med refill request: Vivelle-dot Last AEX: 11/02/22 Next AEX: 12/24/23 Last MMG (if hormonal med) 11/13/22 Refill authorized: Please Advise, #12, 0 RF

## 2023-11-06 ENCOUNTER — Encounter (HOSPITAL_BASED_OUTPATIENT_CLINIC_OR_DEPARTMENT_OTHER): Payer: PPO | Admitting: Internal Medicine

## 2023-11-06 DIAGNOSIS — L89893 Pressure ulcer of other site, stage 3: Secondary | ICD-10-CM | POA: Diagnosis not present

## 2023-11-06 DIAGNOSIS — S91301D Unspecified open wound, right foot, subsequent encounter: Secondary | ICD-10-CM | POA: Diagnosis not present

## 2023-11-06 DIAGNOSIS — I10 Essential (primary) hypertension: Secondary | ICD-10-CM

## 2023-11-06 NOTE — Progress Notes (Signed)
SOLAE, YAFFE ([id number]) [id number] Page 1 of 8 Visit Report for 11/06/2023 Chief Complaint Document Details Patient Name: Date of Service: [name], [name] [name] 11/06/2023 1:15 PM Medical Record Number: [id number] Patient Account Number: [id number] Date of Birth/Sex: Treating RN: Nov 07, 1932 (89 y.o. F) Primary Care Provider: [name] Other Clinician: Referring Provider: Treating Provider/Extender: Bertrum Sol in Treatment: 23 Information Obtained from: Patient Chief Complaint Left LE Ulcer and right lateral foot pressure ulcer Electronic Signature(s) Signed: 11/06/2023 3:27:48 PM By: Geralyn Corwin DO Entered By: Geralyn Corwin on 11/06/2023 14:18:39 -------------------------------------------------------------------------------- HPI Details Patient Name: Date of Service: [name], [name] 11/06/2023 1:15 PM Medical Record Number: [id number] Patient Account Number: [id number] Date of Birth/Sex: Treating RN: 04/23/1933 (89 y.o. F) Primary Care Provider: [name] Other Clinician: Referring Provider: Treating Provider/Extender: Bertrum Sol in Treatment: 23 History of Present Illness HPI Description: READMISSION 07/15/2019 Patient is now an 89 year old woman who was previously here in 2016 and 2018. Cared for at the time by Dr. Meyer Russel. Both times with wounds related to trauma in the left leg. She was felt to have underlying venous insufficiency although both occasions were related to trauma. The patient tells me that 2 weeks ago she hit her lateral left leg on the car door. This was a skin tear with a flap for a period of time she has been using Polysporin. The flap came off recently she has a clean looking superficial wound on the left lateral leg. Our intake nurse reported some drainage. Past medical history; includes sensorineural hearing loss, osteoarthritis, hypertension and a  hammertoe on the right foot for which she follows with podiatry. ABIs in our clinic were 1.19 on the left 07/21/2019; the patient did not like the wraps. They slid down and rub the wound the wound is measuring larger she is upset. 10/12; left lateral leg wound. Most of this looks healthy even under illumination. We have been using Hydrofera Blue under border foam. She would not allow compression 10/19; left lateral leg wound. 2 small open areas remain of the original wound. We have been using Hydrofera Blue under border foam. This seems to be making decent progress 10/26 left lateral leg traumatic wound. There is no open wound remaining. We have been using Hydrofera Blue under border foam she arrived with some denuded epithelium that I removed there is no open wound remaining. Is fairly clear she has some degree of chronic venous insufficiency with not a lot of edema but dilated veins in her feet. She reminds me that she also has reactive arthritis and was on prednisone for a prolonged period of time. Nevertheless I think it would be beneficial for her to at least wear support stockings but right now I do not think she is going to agree to the Readmission: 02/18/2020 upon evaluation today patient has sustained a skin tear which occurred about 1 week ago she tells me. Fortunately there does not appear to be any signs of active infection at this time which is excellent news. She has been tolerating the dressing changes without complication. Overall very pleased with where things stand at this point. The only issue that I see here is that the skin flap somewhat folded back and then reattached further down causing an area that is actually bunched up to form where it closed on itself but then reattached on the end of the tissue. Nonetheless I think we have to trim off the bunched up tissue  in order to allow this to heal appropriately. That is the only issue I really see today. 03/10/2020 upon evaluation  today patient actually appears to be doing excellent at this time. She has healed quite nicely and has been a couple of weeks since Delta ([id number]) [phone].pdf Page 2 of 8 I saw her. Overall I feel like she is completely healed and ready for discharge as of today. Readmission: 03-21-2023 upon evaluation today patient appears to be doing somewhat poorly in regard to the left wound ulcer she tells me has been present for about a month. She tells me that she had this on the metal flatbed shopping carts at [facility] and subsequently has been having issues here with this since she tells me that the original Band-Aid she was using caused this to get worse. With that being said I do not see any evidence of active infection at this time everything seems to really be doing quite well as far as I am concerned. 03-28-2023 upon evaluation today patient appears to be doing well currently in regard to her wounds which in fact appear to be completely healed. I am very pleased with this. Readmission: 05-23-2023 upon evaluation patient presents for reevaluation here in the clinic concerning issues that she has been having with her left anterior lower extremity. This actually appears to have a significant amount of new skin growth over top of the areas of irritation I am not exactly sure what happened here to be perfectly honest. I explained to the patient that this is kind of an unusual presentation for this to be this irritated and yet not have any significant openings at this time. I do not see any evidence of active infection locally or systemically which is great news. 05-30-2023 upon evaluation today patient appears to be doing well currently in regard to her lower extremity wounds which are actually drying out the may be a little bit too dry. I think PolyMem may be better for her and we discussed that today. Fortunately I do not see any signs of active infection locally  or systemically which is great news. 06-06-2023 upon evaluation today patient appears to be doing well currently in regard to her wounds. Both are showing signs of improvement which is great news. Fortunately I do not see any signs of active infection locally or systemically at this time. 8/28; the distal wound on the left leg is healed she still has a comma shaped proximal wound medially. She reminds as she will be leaving for [place] on September night. The remaining wound is on the right upper medial calf a comma shaped wound this may be closed by that time. We had some discussion about compression stockings 06-20-2023 upon evaluation today patient appears to be doing well currently in regard to her wounds in fact most everything appears to be about healed although she does have some dry skin over top of the distal left lateral leg. The proximal medial leg is very close to closure and seems to be doing better. 07-11-2023 upon evaluation today patient appears to be doing well currently in regard to her wound. She has been tolerating the dressing changes without complication. Fortunately there does not appear to be any signs of infection locally or systemically which is great news. Unfortunately she has been having issues with colitis and she appears to be very pale today she also tells me that she has been feeling fairly poorly. Her rheumatologist to put her on prednisone she is also  been put on antibiotics for the colitis. Her wounds on the left anterior lower extremity have reinitiated a little bit here and I am beginning to suspect this may be more autoimmune related. 10-to-24 upon evaluation today patient appears to be doing well currently at this point with regard to her wound. She has been tolerating the dressing changes without complication. Fortunately I do not see any signs of worsening overall I believe that the patient is making good headway towards complete closure which is great news. Still  have a little concerned about the possibility of her having some issues here with a rheumatologic or dermatologic issue. I do believe that the steroid ointment has done a little bit better for her the triamcinolone I prescribed last week. 07-25-2023 upon evaluation today patient appears to be doing really about the same in regard to her wounds. The steroid ointment does not seem to make in a big difference unfortunately. Fortunately I do not see any signs of active infection locally or systemically which is good news. With that being said she does have 2 new pustules that have shown up she states they started looking like pimples initially and then have gotten worse since. With that being said I do believe that we need to see what we can do to try to get this under control she does have an appointment with dermatology next week in the meantime I am actually see about getting her started with a antibiotic ointment instead of the steroid. 08-01-2023 upon evaluation today patient appears to be doing well currently in regard to her left leg which is actually showing signs of improvement the antibiotic ointment actually seems to be doing quite well. I am actually very pleased with where we stand currently. Fortunately I do not see any signs of active infection locally or systemically at this time. No fevers, chills, nausea, vomiting, or diarrhea. 08-08-2023 upon evaluation today patient appears to be doing actually pretty well in regard to her wound compared to where we were previous. I do think that the erythema has improved the wound size is getting a little bit smaller as I see her each week and in general I think that we are on the right track. There is no signs of significant infection although there is erythema I really feel like this may be more pressure related as it is blanchable as opposed to infection. Nonetheless I been leery to go forward with doing an oral antibiotic due to the history with the  GI upset that she had more recently where she got very sick. Nonetheless if she is not significantly improved come next week we may need to consider going forward with the oral antibiotics just to be on the safe side for now we will continue topical mupirocin. 08-15-2023 upon evaluation today patient appears to be doing well currently in regard to her wound. She is actually showing signs of good improvement she does have some slough and biofilm noted we will get a going perform some debridement to clear away the slough and biofilm down to good subcutaneous tissue my hope is this will help to get the area to heal much more effectively and quickly with the collagen. 11/6; patient has an open wound at the base of her fifth metatarsal on the right lateral foot. Small punched-out area with a nonviable surface some erythema around the wound which almost looks like a chronic bursitis rather than infection. She has previously had wounds on the left leg which are apparently  healed 11/13; a wound at the base of her fifth metatarsal on the right lateral foot. This may actually represent a bunion deformity. Last week she had a punched-out area with nonviable surface I removed with a curette. We have been using Iodoflex to clean up the wound bed. She said this hurt for 2 or 3 days but it is settled down now. She is changing the dressing herself. 11/20; fifth metatarsal base at the right lateral foot. I think this is a chronic bursitis type deformity/bunion deformity. We used Iodoflex up until now. Hope we will be changed to endoform today. No debridement. 12/3; right fifth metatarsal base. On almost a bunion like injury. Surrounding callus in this area in the middle of which is a open wound. We had been using Iodoflex however that this caused irritation so we changed to endoform. The wound looks better today there is no surrounding infection. We were considering going to an advanced tissue product either new  shield or Puraply. 12/11; right fifth metatarsal base. Looks like a bunion type deformity to me. The wound is come in fairly nicely. Still an oval-shaped area I am uncertain whether there is epithelialization in the majority of this or whether this is slough I was reluctant debride this today. We have been using endoform with improvement. Consider using polymen. 12/18; right fifth metatarsal base. Again I I really think this is a bunion type deformity. Careful expect inspection of the wound shows about 40% of this is still open 60% epithelialized. She still has rims of callus around this worrisome for continued pressure and/or friction in this area. We have been using endoform. I am going to change back to polymen today 10/23/2023; patient presents for follow-up. She has been using PolyMem to the wound bed. She denies signs of infection. The wound has been present for over 3 months. She reports chronic pain to the area. 10/31/2023; patient presents for follow-up. The dressing was changed to Medihoney and Hydrofera Blue at last clinic visit. She tolerated this change well. She forgot to obtain her x-ray of the right foot. We gave her a new order today to have this done. She denies signs of infection but still has chronic pain to the [facility] ([id number]) [phone].pdf Page 3 of 8 wound site. 11/06/2023; patient presents for follow-up. She had her x-ray done that did not show radiographic evidence of osteomyelitis to where the wound is. She has been using Medihoney and Hydrofera Blue without issues. She denies signs of infection. Electronic Signature(s) Signed: 11/06/2023 3:27:48 PM By: Geralyn Corwin DO Entered By: Geralyn Corwin on 11/06/2023 14:19:15 -------------------------------------------------------------------------------- Physical Exam Details Patient Name: Date of Service: Otho, [name] 11/06/2023 1:15 PM Medical Record Number: [id number] Patient Account  Number: [id number] Date of Birth/Sex: Treating RN: 12-Jun-1933 (89 y.o. F) Primary Care Provider: [name] Other Clinician: Referring Provider: Treating Provider/Extender: Bertrum Sol in Treatment: 23 Constitutional respirations regular, non-labored and within target range for patient.. Cardiovascular 2+ dorsalis pedis/posterior tibialis pulses. Psychiatric pleasant and cooperative. Notes T the base of the right fifth metatarsal there is an open wound with granulation tissue and minimal nonviable tissue. No surrounding signs of infection. Mild o tenderness on palpation. No increased warmth, erythema or purulent drainage. Electronic Signature(s) Signed: 11/06/2023 3:27:48 PM By: Geralyn Corwin DO Entered By: Geralyn Corwin on 11/06/2023 14:20:13 -------------------------------------------------------------------------------- Physician Orders Details Patient Name: Date of Service: [place], [name] 11/06/2023 1:15 PM Medical Record Number: [id number] Patient Account Number:  [id number] Date of Birth/Sex: Treating RN: 07-Apr-1933 (89 y.o. Toniann Fail Primary Care Provider: [name] Other Clinician: Referring Provider: Treating Provider/Extender: Bertrum Sol in Treatment: 27 Verbal / Phone Orders: No Diagnosis Coding Follow-up Appointments ppointment in 2 weeks. - w/ Dr. Mikey Bussing Return A Anesthetic (In clinic) Topical Lidocaine 4% applied to wound bed Cellular or Tissue Based Products Other Cellular or Tissue Based Products Orders/Instructions: - insurance auth for [employer] from [employer] May shower and wash wound with soap and water. PETRONIA, JOICE ([id number]) [id number] Page 4 of 8 Edema Control - Orders / Instructions Elevate legs to the level of the heart or above for 30 minutes daily and/or when sitting for 3-4 times a day throughout the  day. Avoid standing for long periods of time. Wound Treatment Wound #10 - Foot Wound Laterality: Right, Lateral Cleanser: Soap and Water Every Other Day/30 Days Discharge Instructions: May shower and wash wound with dial antibacterial soap and water prior to dressing change. Peri-Wound Care: Sween Lotion (Moisturizing lotion) Every Other Day/30 Days Discharge Instructions: Apply moisturizing lotion as directed Prim Dressing: Hydrofera Blue Ready Transfer Foam, 4x5 (in/in) Every Other Day/30 Days ary Discharge Instructions: Apply to wound bed as instructed Prim Dressing: MediHoney Gel, tube 1.5 (oz) Every Other Day/30 Days ary Discharge Instructions: Apply to wound bed as instructed Secondary Dressing: Optifoam Non-Adhesive Dressing, 4x4 in Every Other Day/30 Days Discharge Instructions: Apply over primary dressing x1 layer foam donut. Secondary Dressing: Woven Gauze Sponges 2x2 in (Generic) Every Other Day/30 Days Discharge Instructions: Apply over primary dressing as directed. Secondary Dressing: Zetuvit Plus Silicone Border Dressing 4x4 (in/in) (Generic) Every Other Day/30 Days Discharge Instructions: Apply silicone border over primary dressing as directed. Electronic Signature(s) Signed: 11/06/2023 3:27:48 PM By: Geralyn Corwin DO Previous Signature: 11/06/2023 1:43:07 PM Version By: Fonnie Mu RN Entered By: Geralyn Corwin on 11/06/2023 14:20:28 -------------------------------------------------------------------------------- Problem List Details Patient Name: Date of Service: [name], [name] 11/06/2023 1:15 PM Medical Record Number: [id number] Patient Account Number: [id number] Date of Birth/Sex: Treating RN: 21-Dec-1932 (89 y.o. F) Primary Care Provider: [name] Other Clinician: Referring Provider: Treating Provider/Extender: Bertrum Sol in Treatment: 23 Active Problems ICD-10 Encounter Code Description Active Date  MDM Diagnosis L89.893 Pressure ulcer of other site, stage 3 07/11/2023 No Yes I10 Essential (primary) hypertension 05/23/2023 No Yes S91.301D Unspecified open wound, right foot, subsequent encounter 10/23/2023 No Yes Inactive Problems ICD-10 Code Description Active Date Inactive Date I87.332 Chronic venous hypertension (idiopathic) with ulcer and inflammation of left lower 05/23/2023 05/23/2023 extremity ZOLA, BURKEL ([id number]) [phone].pdf Page 5 of 8 [phone] Non-pressure chronic ulcer of other part of left lower leg with fat layer exposed 05/23/2023 05/23/2023 A63.016 Non-pressure chronic ulcer of other part of left lower leg with fat layer exposed 05/23/2023 05/23/2023 Resolved Problems Electronic Signature(s) Signed: 11/06/2023 3:27:48 PM By: Geralyn Corwin DO Entered By: Geralyn Corwin on 11/06/2023 14:16:25 -------------------------------------------------------------------------------- Progress Note Details Patient Name: Date of Service: [name], [name] 11/06/2023 1:15 PM Medical Record Number: [id number] Patient Account Number: [id number] Date of Birth/Sex: Treating RN: April 06, 1933 (89 y.o. F) Primary Care Provider: [name] Other Clinician: Referring Provider: Treating Provider/Extender: Bertrum Sol in Treatment: 23 Subjective Chief Complaint Information obtained from Patient Left LE Ulcer and right lateral foot pressure ulcer History of Present Illness (HPI) READMISSION 07/15/2019 Patient is now an 89 year old woman who was previously here in 2016 and 2018. Cared for  at the time by Dr. Meyer Russel. Both times with wounds related to trauma in the left leg. She was felt to have underlying venous insufficiency although both occasions were related to trauma. The patient tells me that 2 weeks ago she hit her lateral left leg on the car door. This was a skin tear with a flap for a period of time she has been using Polysporin. The  flap came off recently she has a clean looking superficial wound on the left lateral leg. Our intake nurse reported some drainage. Past medical history; includes sensorineural hearing loss, osteoarthritis, hypertension and a hammertoe on the right foot for which she follows with podiatry. ABIs in our clinic were 1.19 on the left 07/21/2019; the patient did not like the wraps. They slid down and rub the wound the wound is measuring larger she is upset. 10/12; left lateral leg wound. Most of this looks healthy even under illumination. We have been using Hydrofera Blue under border foam. She would not allow compression 10/19; left lateral leg wound. 2 small open areas remain of the original wound. We have been using Hydrofera Blue under border foam. This seems to be making decent progress 10/26 left lateral leg traumatic wound. There is no open wound remaining. We have been using Hydrofera Blue under border foam she arrived with some denuded epithelium that I removed there is no open wound remaining. Is fairly clear she has some degree of chronic venous insufficiency with not a lot of edema but dilated veins in her feet. She reminds me that she also has reactive arthritis and was on prednisone for a prolonged period of time. Nevertheless I think it would be beneficial for her to at least wear support stockings but right now I do not think she is going to agree to the Readmission: 02/18/2020 upon evaluation today patient has sustained a skin tear which occurred about 1 week ago she tells me. Fortunately there does not appear to be any signs of active infection at this time which is excellent news. She has been tolerating the dressing changes without complication. Overall very pleased with where things stand at this point. The only issue that I see here is that the skin flap somewhat folded back and then reattached further down causing an area that is actually bunched up to form where it closed on itself but  then reattached on the end of the tissue. Nonetheless I think we have to trim off the bunched up tissue in order to allow this to heal appropriately. That is the only issue I really see today. 03/10/2020 upon evaluation today patient actually appears to be doing excellent at this time. She has healed quite nicely and has been a couple of weeks since I saw her. Overall I feel like she is completely healed and ready for discharge as of today. Readmission: 03-21-2023 upon evaluation today patient appears to be doing somewhat poorly in regard to the left wound ulcer she tells me has been present for about a month. She tells me that she had this on the metal flatbed shopping carts at [facility] and subsequently has been having issues here with this since she tells me that the original Band-Aid she was using caused this to get worse. With that being said I do not see any evidence of active infection at this time everything seems to really be doing quite well as far as I am concerned. 03-28-2023 upon evaluation today patient appears to be doing well currently in regard to  her wounds which in fact appear to be completely healed. I am very pleased with this. ZENNA, SPILLER ([id number]) [id number] Page 6 of 8 Readmission: 05-23-2023 upon evaluation patient presents for reevaluation here in the clinic concerning issues that she has been having with her left anterior lower extremity. This actually appears to have a significant amount of new skin growth over top of the areas of irritation I am not exactly sure what happened here to be perfectly honest. I explained to the patient that this is kind of an unusual presentation for this to be this irritated and yet not have any significant openings at this time. I do not see any evidence of active infection locally or systemically which is great news. 05-30-2023 upon evaluation today patient appears to be doing well currently in regard to her lower  extremity wounds which are actually drying out the may be a little bit too dry. I think PolyMem may be better for her and we discussed that today. Fortunately I do not see any signs of active infection locally or systemically which is great news. 06-06-2023 upon evaluation today patient appears to be doing well currently in regard to her wounds. Both are showing signs of improvement which is great news. Fortunately I do not see any signs of active infection locally or systemically at this time. 8/28; the distal wound on the left leg is healed she still has a comma shaped proximal wound medially. She reminds as she will be leaving for [place] on September night. The remaining wound is on the right upper medial calf a comma shaped wound this may be closed by that time. We had some discussion about compression stockings 06-20-2023 upon evaluation today patient appears to be doing well currently in regard to her wounds in fact most everything appears to be about healed although she does have some dry skin over top of the distal left lateral leg. The proximal medial leg is very close to closure and seems to be doing better. 07-11-2023 upon evaluation today patient appears to be doing well currently in regard to her wound. She has been tolerating the dressing changes without complication. Fortunately there does not appear to be any signs of infection locally or systemically which is great news. Unfortunately she has been having issues with colitis and she appears to be very pale today she also tells me that she has been feeling fairly poorly. Her rheumatologist to put her on prednisone she is also been put on antibiotics for the colitis. Her wounds on the left anterior lower extremity have reinitiated a little bit here and I am beginning to suspect this may be more autoimmune related. 10-to-24 upon evaluation today patient appears to be doing well currently at this point with regard to her wound. She has been  tolerating the dressing changes without complication. Fortunately I do not see any signs of worsening overall I believe that the patient is making good headway towards complete closure which is great news. Still have a little concerned about the possibility of her having some issues here with a rheumatologic or dermatologic issue. I do believe that the steroid ointment has done a little bit better for her the triamcinolone I prescribed last week. 07-25-2023 upon evaluation today patient appears to be doing really about the same in regard to her wounds. The steroid ointment does not seem to make in a big difference unfortunately. Fortunately I do not see any signs of active infection locally or systemically which is good  news. With that being said she does have 2 new pustules that have shown up she states they started looking like pimples initially and then have gotten worse since. With that being said I do believe that we need to see what we can do to try to get this under control she does have an appointment with dermatology next week in the meantime I am actually see about getting her started with a antibiotic ointment instead of the steroid. 08-01-2023 upon evaluation today patient appears to be doing well currently in regard to her left leg which is actually showing signs of improvement the antibiotic ointment actually seems to be doing quite well. I am actually very pleased with where we stand currently. Fortunately I do not see any signs of active infection locally or systemically at this time. No fevers, chills, nausea, vomiting, or diarrhea. 08-08-2023 upon evaluation today patient appears to be doing actually pretty well in regard to her wound compared to where we were previous. I do think that the erythema has improved the wound size is getting a little bit smaller as I see her each week and in general I think that we are on the right track. There is no signs of significant infection although  there is erythema I really feel like this may be more pressure related as it is blanchable as opposed to infection. Nonetheless I been leery to go forward with doing an oral antibiotic due to the history with the GI upset that she had more recently where she got very sick. Nonetheless if she is not significantly improved come next week we may need to consider going forward with the oral antibiotics just to be on the safe side for now we will continue topical mupirocin. 08-15-2023 upon evaluation today patient appears to be doing well currently in regard to her wound. She is actually showing signs of good improvement she does have some slough and biofilm noted we will get a going perform some debridement to clear away the slough and biofilm down to good subcutaneous tissue my hope is this will help to get the area to heal much more effectively and quickly with the collagen. 11/6; patient has an open wound at the base of her fifth metatarsal on the right lateral foot. Small punched-out area with a nonviable surface some erythema around the wound which almost looks like a chronic bursitis rather than infection. She has previously had wounds on the left leg which are apparently healed 11/13; a wound at the base of her fifth metatarsal on the right lateral foot. This may actually represent a bunion deformity. Last week she had a punched-out area with nonviable surface I removed with a curette. We have been using Iodoflex to clean up the wound bed. She said this hurt for 2 or 3 days but it is settled down now. She is changing the dressing herself. 11/20; fifth metatarsal base at the right lateral foot. I think this is a chronic bursitis type deformity/bunion deformity. We used Iodoflex up until now. Hope we will be changed to endoform today. No debridement. 12/3; right fifth metatarsal base. On almost a bunion like injury. Surrounding callus in this area in the middle of which is a open wound. We had been  using Iodoflex however that this caused irritation so we changed to endoform. The wound looks better today there is no surrounding infection. We were considering going to an advanced tissue product either new shield or Puraply. 12/11; right fifth metatarsal base. Looks  like a bunion type deformity to me. The wound is come in fairly nicely. Still an oval-shaped area I am uncertain whether there is epithelialization in the majority of this or whether this is slough I was reluctant debride this today. We have been using endoform with improvement. Consider using polymen. 12/18; right fifth metatarsal base. Again I I really think this is a bunion type deformity. Careful expect inspection of the wound shows about 40% of this is still open 60% epithelialized. She still has rims of callus around this worrisome for continued pressure and/or friction in this area. We have been using endoform. I am going to change back to polymen today 10/23/2023; patient presents for follow-up. She has been using PolyMem to the wound bed. She denies signs of infection. The wound has been present for over 3 months. She reports chronic pain to the area. 10/31/2023; patient presents for follow-up. The dressing was changed to Medihoney and Hydrofera Blue at last clinic visit. She tolerated this change well. She forgot to obtain her x-ray of the right foot. We gave her a new order today to have this done. She denies signs of infection but still has chronic pain to the wound site. 11/06/2023; patient presents for follow-up. She had her x-ray done that did not show radiographic evidence of osteomyelitis to where the wound is. She has been using Medihoney and Hydrofera Blue without issues. She denies signs of infection. MAGLINE, ZYSKOWSKI ([id number]) [id number] Page 7 of 8 Objective Constitutional respirations regular, non-labored and within target range for patient.. Vitals Time Taken: 1:35 PM, Height: 62 in,  Weight: 105 lbs, BMI: 19.2, Temperature: 97.9 F, Pulse: 66 bpm, Respiratory Rate: 17 breaths/min, Blood Pressure: 151/66 mmHg. Cardiovascular 2+ dorsalis pedis/posterior tibialis pulses. Psychiatric pleasant and cooperative. General Notes: T the base of the right fifth metatarsal there is an open wound with granulation tissue and minimal nonviable tissue. No surrounding signs of o infection. Mild tenderness on palpation. No increased warmth, erythema or purulent drainage. Integumentary (Hair, Skin) Wound #10 status is Open. Original cause of wound was Gradually Appeared. The date acquired was: 06/19/2023. The wound has been in treatment 16 weeks. The wound is located on the Right,Lateral Foot. The wound measures 0.4cm length x 0.4cm width x 0.2cm depth; 0.126cm^2 area and 0.025cm^3 volume. There is Fat Layer (Subcutaneous Tissue) exposed. There is no tunneling or undermining noted. There is a medium amount of serosanguineous drainage noted. The wound margin is distinct with the outline attached to the wound base. There is medium (34-66%) red, pink granulation within the wound bed. There is a medium (34-66%) amount of necrotic tissue within the wound bed including Adherent Slough. The periwound skin appearance had no abnormalities noted for moisture. The periwound skin appearance had no abnormalities noted for color. The periwound skin appearance exhibited: Callus. The periwound skin appearance did not exhibit: Crepitus, Excoriation, Induration, Rash, Scarring. Periwound temperature was noted as No Abnormality. Assessment Active Problems ICD-10 Pressure ulcer of other site, stage 3 Essential (primary) hypertension Unspecified open wound, right foot, subsequent encounter Patient's wound appears slightly smaller. No concerning features today. X-ray was negative for evidence of osteomyelitis. I recommended continuing with Hydrofera Blue and Medihoney. She does not tolerate an offloading donut.  Continue aggressive offloading by keeping the area padded. Follow-up in 2 weeks. Plan Follow-up Appointments: Return Appointment in 2 weeks. - w/ Dr. 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 Edema Control - Orders / Instructions: Elevate legs to the level of the heart or above for 30 minutes daily and/or when sitting for 3-4 times a day throughout the day. Avoid standing for long periods of time. WOUND #10: - Foot Wound Laterality: Right, Lateral Cleanser: Soap and Water Every Other Day/30 Days Discharge Instructions: May shower and wash wound with dial antibacterial soap and water prior to dressing change. Peri-Wound Care: Sween Lotion (Moisturizing lotion) Every Other Day/30 Days Discharge Instructions: Apply moisturizing lotion as directed Prim Dressing: Hydrofera Blue Ready Transfer Foam, 4x5 (in/in) Every Other Day/30 Days ary Discharge Instructions: Apply to wound bed as instructed Prim Dressing: MediHoney Gel, tube 1.5 (oz) Every Other Day/30 Days ary Discharge Instructions: Apply to wound bed as instructed Secondary Dressing: Optifoam Non-Adhesive Dressing, 4x4 in Every Other Day/30 Days Discharge Instructions: Apply over primary dressing x1 layer foam donut. Secondary Dressing: Woven Gauze Sponges 2x2 in (Generic) Every Other Day/30 Days Discharge Instructions: Apply over primary dressing as directed. Secondary Dressing: Zetuvit Plus Silicone Border Dressing 4x4 (in/in) (Generic) Every Other Day/30 Days Discharge Instructions: Apply silicone border over primary dressing as directed. 1. Medihoney and Hydrofera Blue 2. Follow-up in 2 weeks 3. Offload Electronic Signature(s) Grant, Marshall L ([id number])  [id number] Page 8 of 8 Signed: 11/06/2023 3:27:48 PM By: Geralyn Corwin DO Entered By: Geralyn Corwin on 11/06/2023 14:22:17 -------------------------------------------------------------------------------- SuperBill Details Patient Name: Date of Service: [place], [name] 11/06/2023 Medical Record Number: [id number] Patient Account Number: [id number] Date of Birth/Sex: Treating RN: Jun 25, 1933 (89 y.o. [name], [name] Primary Care Provider: [name] Other Clinician: Referring Provider: Treating Provider/Extender: Bertrum Sol in Treatment: 23 Diagnosis Coding ICD-10 Codes Code Description [phone] Pressure ulcer of other site, stage 3 I10 Essential (primary) hypertension S91.301D Unspecified open wound, right foot, subsequent encounter Facility Procedures : CPT4 Code: [id number] Description: 99213 - WOUND CARE VISIT-LEV 3 EST PT Modifier: Quantity: 1 Physician Procedures : CPT4 Code Description Modifier [id number] - WC PHYS LEVEL 3 - EST PT ICD-10 Diagnosis Description L89.893 Pressure ulcer of other site, stage 3 I10 Essential (primary) hypertension S91.301D Unspecified open wound, right foot, subsequent encounter Quantity: 1 Electronic Signature(s) Signed: 11/06/2023 3:27:48 PM By: Geralyn Corwin DO Previous Signature: 11/06/2023 2:06:24 PM Version By: Fonnie Mu RN Entered By: Geralyn Corwin on 11/06/2023 14:22:34

## 2023-11-06 NOTE — Progress Notes (Signed)
[name], [name] ([id number]) [id number] Page 1 of 9 Visit Report for 10/31/2023 Arrival Information Details Patient Name: Date of Service: [place], [name] 10/31/2023 10:15 A M Medical Record Number: [id number] Patient Account Number: [id number] Date of Birth/Sex: Treating RN: 09-17-1933 (89 y.o. F) Primary Care Lautaro Koral: [name] Other Clinician: Referring Sibel Khurana: Treating Aprile Dickenson/Extender: Bertrum Sol in Treatment: 23 Visit Information History Since Last Visit Added or deleted any medications: No Patient Arrived: Ambulatory Any new allergies or adverse reactions: No Arrival Time: 10:18 Had a fall or experienced change in No Accompanied By: self activities of daily living that may affect Transfer Assistance: None risk of falls: Patient Identification Verified: Yes Signs or symptoms of abuse/neglect since last visito No Secondary Verification Process Completed: Yes Hospitalized since last visit: No Patient Requires Transmission-Based Precautions: No Implantable device outside of the clinic excluding No Patient Has Alerts: No cellular tissue based products placed in the center since last visit: Has Dressing in Place as Prescribed: Yes Pain Present Now: No Electronic Signature(s) Signed: 10/31/2023 4:44:23 PM By: Midge Aver MSN RN CNS WTA Entered By: Midge Aver on 10/31/2023 10:54:59 -------------------------------------------------------------------------------- Clinic Level of Care Assessment Details Patient Name: Date of Service: [name], [name] 10/31/2023 10:15 A M Medical Record Number: [id number] Patient Account Number: [id number] Date of Birth/Sex: Treating RN: March 19, 1933 (89 y.o. [name] Primary Care Baylei Siebels: [name] Other Clinician: Referring [name]: Treating [name]/Extender: Bertrum Sol in Treatment: 23 Clinic Level of Care Assessment Items TOOL 4  Quantity Score X- 1 0 Use when only an EandM is performed on FOLLOW-UP visit ASSESSMENTS - Nursing Assessment / Reassessment X- 1 10 Reassessment of Co-morbidities (includes updates in patient status) X- 1 5 Reassessment of Adherence to Treatment Plan ASSESSMENTS - Wound and Skin A ssessment / Reassessment X - Simple Wound Assessment / Reassessment - one wound 1 5 []  - 0 Complex Wound Assessment / Reassessment - multiple wounds []  - 0 Dermatologic / Skin Assessment (not related to wound area) ASSESSMENTS - Focused Assessment []  - 0 Circumferential Edema Measurements - multi extremities []  - 0 Nutritional Assessment / Counseling / Intervention ALLEA, HERTZBERG ([id number]) [id number] Page 2 of 9 []  - 0 Lower Extremity Assessment (monofilament, tuning fork, pulses) []  - 0 Peripheral Arterial Disease Assessment (using hand held doppler) ASSESSMENTS - Ostomy and/or Continence Assessment and Care []  - 0 Incontinence Assessment and Management []  - 0 Ostomy Care Assessment and Management (repouching, etc.) PROCESS - Coordination of Care X - Simple Patient / Family Education for ongoing care 1 15 []  - 0 Complex (extensive) Patient / Family Education for ongoing care X- 1 10 Staff obtains Chiropractor, Records, T Results / Process Orders est []  - 0 Staff telephones HHA, Nursing Homes / Clarify orders / etc []  - 0 Routine Transfer to another Facility (non-emergent condition) []  - 0 Routine Hospital Admission (non-emergent condition) []  - 0 New Admissions / Manufacturing engineer / Ordering NPWT Apligraf, etc. , []  - 0 Emergency Hospital Admission (emergent condition) X- 1 10 Simple Discharge Coordination []  - 0 Complex (extensive) Discharge Coordination PROCESS - Special Needs []  - 0 Pediatric / Minor Patient Management []  - 0 Isolation Patient Management []  - 0 Hearing / Language / Visual special needs []  - 0 Assessment of Community assistance  (transportation, D/C planning, etc.) []  - 0 Additional assistance / Altered mentation []  - 0 Support Surface(s) Assessment (bed, cushion, seat, etc.) INTERVENTIONS - Wound Cleansing /  Measurement X - Simple Wound Cleansing - one wound 1 5 []  - 0 Complex Wound Cleansing - multiple wounds X- 1 5 Wound Imaging (photographs - any number of wounds) []  - 0 Wound Tracing (instead of photographs) X- 1 5 Simple Wound Measurement - one wound []  - 0 Complex Wound Measurement - multiple wounds INTERVENTIONS - Wound Dressings X - Small Wound Dressing one or multiple wounds 1 10 []  - 0 Medium Wound Dressing one or multiple wounds []  - 0 Large Wound Dressing one or multiple wounds X- 1 5 Application of Medications - topical []  - 0 Application of Medications - injection INTERVENTIONS - Miscellaneous []  - 0 External ear exam []  - 0 Specimen Collection (cultures, biopsies, blood, body fluids, etc.) []  - 0 Specimen(s) / Culture(s) sent or taken to Lab for analysis []  - 0 Patient Transfer (multiple staff / Nurse, adult / Similar devices) []  - 0 Simple Staple / Suture removal (25 or less) []  - 0 Complex Staple / Suture removal (26 or more) []  - 0 Hypo / Hyperglycemic Management (close monitor of Blood Glucose) [name], [name] L ([id number]) [phone].pdf Page 3 of 9 []  - 0 Ankle / Brachial Index (ABI) - do not check if billed separately X- 1 5 Vital Signs Has the patient been seen at the hospital within the last three years: Yes Total Score: 90 Level Of Care: New/Established - Level 3 Electronic Signature(s) Signed: 10/31/2023 4:44:23 PM By: Midge Aver MSN RN CNS WTA Entered By: Midge Aver on 10/31/2023 11:08:53 -------------------------------------------------------------------------------- Encounter Discharge Information Details Patient Name: Date of Service: [name], [name] 10/31/2023 10:15 A M Medical Record Number: [id number] Patient Account Number:  [id number] Date of Birth/Sex: Treating RN: 04/28/1933 (89 y.o. [name] Primary Care Maddalena Linarez: [name] Other Clinician: Referring Ange Puskas: Treating Krishawna Stiefel/Extender: Bertrum Sol in Treatment: 23 Encounter Discharge Information Items Discharge Condition: Stable Ambulatory Status: Ambulatory Discharge Destination: Home Transportation: Private Auto Accompanied By: self Schedule Follow-up Appointment: Yes Clinical Summary of Care: Electronic Signature(s) Signed: 10/31/2023 4:44:23 PM By: Midge Aver MSN RN CNS WTA Entered By: Midge Aver on 10/31/2023 11:08:04 -------------------------------------------------------------------------------- Lower Extremity Assessment Details Patient Name: Date of Service: [place], [name] 10/31/2023 10:15 A M Medical Record Number: [id number] Patient Account Number: [id number] Date of Birth/Sex: Treating RN: 1933/01/15 (89 y.o. F) Primary Care Erric Machnik: [name] Other Clinician: Referring Yameli Delamater: Treating Kimblery Diop/Extender: Bertrum Sol in Treatment: 23 Edema Assessment Assessed: [Left: No] [Right: No] Edema: [Left: N] [Right: o] Calf Left: Right: Point of Measurement: From Medial Instep 29.4 cm Ankle Left: Right: Point of Measurement: From Medial Instep 16.5 cm Vascular Assessment DESHAYLA, HELKE L ([id number]) [Right:[id number] Page 4 of 9] Extremity colors, hair growth, and conditions: Extremity Color: [Right:Hyperpigmented] Hair Growth on Extremity: [Right:No] Temperature of Extremity: [Right:Cold] Capillary Refill: [Right:< 3 seconds] Dependent Rubor: [Right:No No] Toe Nail Assessment Left: Right: Thick: No Discolored: No Deformed: No Improper Length and Hygiene: No Electronic Signature(s) Signed: 10/31/2023 4:44:23 PM By: Midge Aver MSN RN CNS WTA Entered By: Midge Aver on 10/31/2023  10:56:46 -------------------------------------------------------------------------------- Multi Wound Chart Details Patient Name: Date of Service: [place], [name] 10/31/2023 10:15 A M Medical Record Number: [id number] Patient Account Number: [id number] Date of Birth/Sex: Treating RN: 1932-11-27 (89 y.o. F) Primary Care Malaina Mortellaro: [name] Other Clinician: Referring Daryn Pisani: Treating Mardy Hoppe/Extender: Bertrum Sol in Treatment: 23 Vital Signs Height(in): 62 Pulse(bpm): 53 Weight(lbs): 105 Blood Pressure(mmHg): 152/61 Body  Mass Index(BMI): 19.2 Temperature(F): 98.1 Respiratory Rate(breaths/min): 18 [10:Photos:] [N/A:N/A] Right, Lateral Foot N/A N/A Wound Location: Gradually Appeared N/A N/A Wounding Event: Pressure Ulcer N/A N/A Primary Etiology: Cataracts, Hypertension, Raynauds, N/A N/A Comorbid History: Rheumatoid Arthritis, Osteoarthritis 06/19/2023 N/A N/A Date Acquired: 16 N/A N/A Weeks of Treatment: Open N/A N/A Wound Status: No N/A N/A Wound Recurrence: 0.4x0.5x0.1 N/A N/A Measurements L x W x D (cm) 0.157 N/A N/A A (cm) : rea 0.016 N/A N/A Volume (cm) : 52.40% N/A N/A % Reduction in A rea: 75.80% N/A N/A % Reduction in Volume: Category/Stage III N/A N/A Classification: Medium N/A N/A Exudate A mount: Serosanguineous N/A N/A Exudate Type: red, brown N/A N/A Exudate Color: Distinct, outline attached N/A N/A Wound Margin: Small (1-33%) N/A N/A Granulation A mount: Red N/A N/A Granulation Quality: Large (67-100%) N/A N/A Necrotic A mountEFRATA, GENTHER ([id number]) [phone] Page 5 of 9 Fat Layer (Subcutaneous Tissue): Yes N/A N/A Exposed Structures: Fascia: No Tendon: No Muscle: No Joint: No Bone: No Small (1-33%) N/A N/A Epithelialization: Callus: Yes N/A N/A Periwound Skin Texture: Excoriation: No Induration: No Crepitus: No Rash: No Scarring: No Dry/Scaly: Yes N/A  N/A Periwound Skin Moisture: Maceration: No Atrophie Blanche: No N/A N/A Periwound Skin Color: Cyanosis: No Ecchymosis: No Erythema: No Hemosiderin Staining: No Mottled: No Pallor: No Rubor: No No Abnormality N/A N/A Temperature: Treatment Notes Wound #10 (Foot) Wound Laterality: Right, Lateral Cleanser Soap and Water Discharge Instruction: May shower and wash wound with dial antibacterial soap and water prior to dressing change. Peri-Wound Care Sween Lotion (Moisturizing lotion) Discharge Instruction: Apply moisturizing lotion as directed Topical Primary Dressing Hydrofera Blue Ready Transfer Foam, 4x5 (in/in) Discharge Instruction: Apply to wound bed as instructed MediHoney Gel, tube 1.5 (oz) Discharge Instruction: Apply to wound bed as instructed Secondary Dressing Optifoam Non-Adhesive Dressing, 4x4 in Discharge Instruction: Apply over primary dressing x1 layer foam donut. Woven Gauze Sponges 2x2 in Discharge Instruction: Apply over primary dressing as directed. Zetuvit Plus Silicone Border Dressing 4x4 (in/in) Discharge Instruction: Apply silicone border over primary dressing as directed. Secured With Compression Wrap Compression Stockings Add-Ons Electronic Signature(s) Signed: 10/31/2023 4:27:59 PM By: Geralyn Corwin DO Entered By: Geralyn Corwin on 10/31/2023 12:27:03 -------------------------------------------------------------------------------- Multi-Disciplinary Care Plan Details Patient Name: Date of Service: [place], [name] 10/31/2023 10:15 A M Medical Record Number: [id number] Patient Account Number: [id number] CARMETTA, BERING ([id number]) [phone].pdf Page 6 of 9 Date of Birth/Sex: Treating RN: [birth year] (89 y.o. [name] Primary Care Larisa Lanius: Other Clinician: [name] Referring Haniyah Maciolek: Treating Alylah Blakney/Extender: Bertrum Sol in Treatment: 23 Multidisciplinary Care Plan  reviewed with physician Active Inactive Wound/Skin Impairment Nursing Diagnoses: Impaired tissue integrity Knowledge deficit related to ulceration/compromised skin integrity Goals: Patient/caregiver will verbalize understanding of skin care regimen Date Initiated: 05/23/2023 Target Resolution Date: 11/17/2023 Goal Status: Active Ulcer/skin breakdown will have a volume reduction of 30% by week 4 Date Initiated: 05/23/2023 Date Inactivated: 08/22/2023 Target Resolution Date: 08/20/2023 Unmet Reason: see wound Goal Status: Unmet measurement. Interventions: Assess patient/caregiver ability to obtain necessary supplies Assess patient/caregiver ability to perform ulcer/skin care regimen upon admission and as needed Assess ulceration(s) every visit Provide education on ulcer and skin care Treatment Activities: Skin care regimen initiated : 05/23/2023 Topical wound management initiated : 05/23/2023 Notes: Electronic Signature(s) Signed: 10/31/2023 4:44:23 PM By: Midge Aver MSN RN CNS WTA Entered By: Midge Aver on 10/31/2023 11:02:52 -------------------------------------------------------------------------------- Pain Assessment Details Patient Name: Date of Service: [name], Jaquita Rector L. 10/31/2023 10:15  A M Medical Record Number: [id number] Patient Account Number: [id number] Date of Birth/Sex: Treating RN: Jul 13, 1933 (89 y.o. F) Primary Care Jamie Hafford: [name] Other Clinician: Referring Tieasha Larsen: Treating Nataleigh Griffin/Extender: Bertrum Sol in Treatment: 23 Active Problems Location of Pain Severity and Description of Pain Patient Has Paino No Site Locations [place], [place] L ([id number]) [id number] Page 7 of 9 Pain Management and Medication Current Pain Management: Electronic Signature(s) Signed: 10/31/2023 4:44:23 PM By: Midge Aver MSN RN CNS WTA Entered By: Midge Aver on 10/31/2023  10:55:06 -------------------------------------------------------------------------------- Patient/Caregiver Education Details Patient Name: Date of Service: [place], [name] 1/15/2025andnbsp10:15 A M Medical Record Number: [id number] Patient Account Number: [id number] Date of Birth/Gender: Treating RN: 12-21-1932 (89 y.o. [name] Primary Care Physician: [name] Other Clinician: Referring Physician: Treating Physician/Extender: Bertrum Sol in Treatment: 23 Education Assessment Education Provided To: Patient Education Topics Provided Wound/Skin Impairment: Handouts: Caring for Your Ulcer Methods: Explain/Verbal Responses: State content correctly Electronic Signature(s) Signed: 10/31/2023 4:44:23 PM By: Midge Aver MSN RN CNS WTA Entered By: Midge Aver on 10/31/2023 11:03:06 -------------------------------------------------------------------------------- Wound Assessment Details Patient Name: Date of Service: [place], [name] 10/31/2023 10:15 A M Medical Record Number: [id number] Patient Account Number: [id number] Date of Birth/Sex: Treating RN: [birth year] (89 y.o. F) Primary Care Eve Rey: [name] Other Clinician: FIDELIS, SUSANA ([id number]) [id number] Page 8 of 9 Referring Jake Goodson: Treating Talayeh Bruinsma/Extender: Bertrum Sol in Treatment: 23 Wound Status Wound Number: 10 Primary Pressure Ulcer Etiology: Wound Location: Right, Lateral Foot Wound Status: Open Wounding Event: Gradually Appeared Comorbid Cataracts, Hypertension, Raynauds, Rheumatoid Arthritis, Date Acquired: 06/19/2023 History: Osteoarthritis Weeks Of Treatment: 16 Clustered Wound: No Photos Wound Measurements Length: (cm) 0.4 Width: (cm) 0.5 Depth: (cm) 0.1 Area: (cm) 0.157 Volume: (cm) 0.016 % Reduction in Area: 52.4% % Reduction in Volume: 75.8% Epithelialization: Small (1-33%) Tunneling:  No Undermining: No Wound Description Classification: Category/Stage III Wound Margin: Distinct, outline attached Exudate Amount: Medium Exudate Type: Serosanguineous Exudate Color: red, brown Foul Odor After Cleansing: No Slough/Fibrino Yes Wound Bed Granulation Amount: Small (1-33%) Exposed Structure Granulation Quality: Red Fascia Exposed: No Necrotic Amount: Large (67-100%) Fat Layer (Subcutaneous Tissue) Exposed: Yes Necrotic Quality: Adherent Slough Tendon Exposed: No Muscle Exposed: No Joint Exposed: No Bone Exposed: No Periwound Skin Texture Texture Color No Abnormalities Noted: No No Abnormalities Noted: Yes Callus: Yes Temperature / Pain Crepitus: No Temperature: No Abnormality Excoriation: No Induration: No Rash: No Scarring: No Moisture No Abnormalities Noted: Yes Electronic Signature(s) Signed: 11/06/2023 4:04:36 PM By: Thayer Dallas Entered By: Thayer Dallas on 10/31/2023 10:37:00 Jenna Luo ([id number]) [id number] Page 9 of 9 -------------------------------------------------------------------------------- Vitals Details Patient Name: Date of Service: [place], [name] 10/31/2023 10:15 A M Medical Record Number: [id number] Patient Account Number: [id number] Date of Birth/Sex: Treating RN: 1933-05-20 (89 y.o. F) Primary Care Bobette Leyh: [name] Other Clinician: Referring Geanine Vandekamp: Treating Cashmere Harmes/Extender: Bertrum Sol in Treatment: 23 Vital Signs Time Taken: 10:33 Temperature (F): 98.1 Height (in): 62 Pulse (bpm): 53 Weight (lbs): 105 Respiratory Rate (breaths/min): 18 Body Mass Index (BMI): 19.2 Blood Pressure (mmHg): 152/61 Reference Range: 80 - 120 mg / dl Electronic Signature(s) Signed: 10/31/2023 4:44:23 PM By: Midge Aver MSN RN CNS WTA Entered By: Midge Aver on 10/31/2023 10:55:02

## 2023-11-10 ENCOUNTER — Emergency Department (HOSPITAL_BASED_OUTPATIENT_CLINIC_OR_DEPARTMENT_OTHER): Payer: PPO

## 2023-11-10 ENCOUNTER — Other Ambulatory Visit: Payer: Self-pay

## 2023-11-10 ENCOUNTER — Encounter (HOSPITAL_BASED_OUTPATIENT_CLINIC_OR_DEPARTMENT_OTHER): Payer: Self-pay | Admitting: Emergency Medicine

## 2023-11-10 ENCOUNTER — Emergency Department (HOSPITAL_BASED_OUTPATIENT_CLINIC_OR_DEPARTMENT_OTHER)
Admission: EM | Admit: 2023-11-10 | Discharge: 2023-11-10 | Disposition: A | Discharge status: Home / Self Care | Payer: PPO | Attending: Emergency Medicine | Admitting: Emergency Medicine

## 2023-11-10 DIAGNOSIS — L03115 Cellulitis of right lower limb: Secondary | ICD-10-CM | POA: Insufficient documentation

## 2023-11-10 DIAGNOSIS — D72829 Elevated white blood cell count, unspecified: Secondary | ICD-10-CM | POA: Diagnosis not present

## 2023-11-10 DIAGNOSIS — Z91040 Latex allergy status: Secondary | ICD-10-CM | POA: Insufficient documentation

## 2023-11-10 DIAGNOSIS — R2241 Localized swelling, mass and lump, right lower limb: Secondary | ICD-10-CM | POA: Diagnosis present

## 2023-11-10 LAB — CBC WITH DIFFERENTIAL/PLATELET
Abs Immature Granulocytes: 0.04 10*3/uL (ref 0.00–0.07)
Basophils Absolute: 0 10*3/uL (ref 0.0–0.1)
Basophils Relative: 0 %
Eosinophils Absolute: 0.1 10*3/uL (ref 0.0–0.5)
Eosinophils Relative: 1 %
HCT: 36.6 % (ref 36.0–46.0)
Hemoglobin: 12.4 g/dL (ref 12.0–15.0)
Immature Granulocytes: 0 %
Lymphocytes Relative: 11 %
Lymphs Abs: 1.4 10*3/uL (ref 0.7–4.0)
MCH: 32.4 pg (ref 26.0–34.0)
MCHC: 33.9 g/dL (ref 30.0–36.0)
MCV: 95.6 fL (ref 80.0–100.0)
Monocytes Absolute: 1.2 10*3/uL — ABNORMAL HIGH (ref 0.1–1.0)
Monocytes Relative: 10 %
Neutro Abs: 9.2 10*3/uL — ABNORMAL HIGH (ref 1.7–7.7)
Neutrophils Relative %: 78 %
Platelets: 309 10*3/uL (ref 150–400)
RBC: 3.83 MIL/uL — ABNORMAL LOW (ref 3.87–5.11)
RDW: 13.5 % (ref 11.5–15.5)
WBC: 11.9 10*3/uL — ABNORMAL HIGH (ref 4.0–10.5)
nRBC: 0 % (ref 0.0–0.2)

## 2023-11-10 LAB — BASIC METABOLIC PANEL
Anion gap: 6 (ref 5–15)
BUN: 22 mg/dL (ref 8–23)
CO2: 28 mmol/L (ref 22–32)
Calcium: 9.4 mg/dL (ref 8.9–10.3)
Chloride: 98 mmol/L (ref 98–111)
Creatinine, Ser: 0.83 mg/dL (ref 0.44–1.00)
GFR, Estimated: >60 mL/min (ref >60)
Glucose, Bld: 92 mg/dL (ref 70–99)
Potassium: 4.1 mmol/L (ref 3.5–5.1)
Sodium: 132 mmol/L — ABNORMAL LOW (ref 135–145)

## 2023-11-10 MED ORDER — DOXYCYCLINE HYCLATE 100 MG PO CAPS: 100.0000 mg | ORAL_CAPSULE | Freq: Two times a day (BID) | ORAL | 0 refills | Status: AC

## 2023-11-10 NOTE — ED Triage Notes (Signed)
Pt caox4, ambulatory c/o R lower leg redness and swelling that started Thursday shortly after taking her first injection of Humira for RA. Pt states injection was done in the abd. Denies PMH cellulitis, but does have chronic wound to R foot that she says has been treated and ongoing without complications thus far. PMS intact in R foot.

## 2023-11-10 NOTE — ED Provider Notes (Signed)
[place] [facility]MENT AT [facility] Provider Note   CSN: [id number] Arrival date & time: 11/10/23  4098     History  Chief Complaint  Patient presents with   Leg Swelling    [name] is a 89 y.o. female.  HPI   89 year old female with medical history significant for GERD, rheumatoid arthritis, presenting to the emergency department with redness and swelling in her right leg.  The patient states that she recently underwent her first injection of Humira for treatment for rheumatoid arthritis which was prescribed by her rheumatologist.  She states that the injection was done in the abdomen.  She states that since Thursday she developed redness in the right lower extremity with associated swelling.  It is mildly warm and painful.  She denies any fevers or chills.  She denies any history of DVT and is not on anticoagulation.  Home Medications Prior to Admission medications   Medication Sig Start Date End Date Taking? Authorizing Provider  doxycycline (VIBRAMYCIN) 100 MG capsule Take 1 capsule (100 mg total) by mouth 2 (two) times daily for 5 days. 11/10/23 11/15/23 Yes Ernie Avena, MD  Acetaminophen (TYLENOL EXTRA STRENGTH PO) Take by mouth.    [provider]  B Complex Vitamins (B-COMPLEX/B-12 PO) Take by mouth daily.    [provider]  betamethasone dipropionate 0.05 % cream SMARTSIG:Sparingly Topical Twice Daily 10/20/21   [provider]  Biotin 10 MG TABS Take by mouth.    [provider]  Calcium Citrate (CITRACAL PO) Take by mouth.    [provider]  clobetasol (TEMOVATE) 0.05 % external solution SMARTSIG:1 Milliliter(s) Topical Every Night 05/09/21   [provider]  conjugated estrogens (PREMARIN) vaginal cream PLACE 0.5 MG VAGINALLY 2 TIMES A WEEK AT BEDTIME 11/06/22   Ardell Isaacs, Forrestine Him, MD  diclofenac sodium (VOLTAREN) 1 % GEL  10/24/16   [provider]  estradiol (VIVELLE-DOT) 0.0375  MG/24HR APPLY 1/2 PATCH TOPICALLY TO THE SKIN 2 TIMES A WEEK 11/05/23   Ardell Isaacs, Forrestine Him, MD  hydroxychloroquine (PLAQUENIL) 200 MG tablet Take 200 mg by mouth daily. 08/25/19   [provider]  hyoscyamine (LEVSIN/SL) 0.125 MG SL tablet Place 1 tablet (0.125 mg total) under the tongue every 6 (six) hours as needed. 07/07/23   Judithann Sheen, PA  inFLIXimab-abda (RENFLEXIS IV) Inject into the vein.    [provider]  ipratropium (ATROVENT) 0.06 % nasal spray SMARTSIG:2 Spray(s) Both Nares 3 Times Daily PRN    [provider]  metoprolol succinate (TOPROL-XL) 25 MG 24 hr tablet Takes 1/2 05/21/19   [provider]  Multiple Vitamin (MULTIVITAMIN ADULT) TABS     [provider]  naloxone (NARCAN) nasal spray 4 mg/0.1 mL SMARTSIG:Both Nares 07/13/22   [provider]  NONFORMULARY OR COMPOUNDED ITEM Testosterone Propionate Petrolatum (jar) 2% ointment Sig: APPLY ONE TEAR-DROP SIZE AMOUNT TO INNER THIGH AREA AT BEDTIME 1 TIME A WEEK.  ALTERNATE THIGHS. 06/21/23   Amundson Shirley Friar, MD  OVER THE COUNTER MEDICATION Place 1 drop into both eyes 2 (two) times daily as needed (redness/ dry eyes). Over the counter eye drops    [provider]  oxyCODONE (OXY IR/ROXICODONE) 5 MG immediate release tablet Take 5 mg by mouth at bedtime as needed. 06/15/20   [provider]  pantoprazole (PROTONIX) 20 MG tablet Take 20 mg by mouth daily. 10/24/21   [provider]  RESTASIS 0.05 % ophthalmic  emulsion INT 1 GTT INTO OU BID 11/12/18   [provider]  valACYclovir (VALTREX) 1000 MG tablet as needed.  01/06/17   [provider]  zolpidem (AMBIEN) 10 MG tablet Take 5-10 mg by mouth at bedtime as needed. 02/13/22   [provider]      Allergies    Sulfa antibiotics and Latex    Review of Systems   Review of Systems  Cardiovascular:  Positive for leg swelling.  Skin:  Positive for rash.  All other  systems reviewed and are negative.   Physical Exam Updated Vital Signs BP (!) 152/67   Pulse 64   Temp 98 F (36.7 C) (Oral)   Resp 14   Ht 5\' 2"  (1.575 m)   Wt 46.7 kg   SpO2 96%   BMI 18.84 kg/m  Physical Exam Vitals and nursing note reviewed.  Constitutional:      General: She is not in acute distress.    Appearance: She is well-developed.  HENT:     Head: Normocephalic and atraumatic.  Eyes:     Conjunctiva/sclera: Conjunctivae normal.  Cardiovascular:     Rate and Rhythm: Normal rate and regular rhythm.  Pulmonary:     Effort: Pulmonary effort is normal. No respiratory distress.     Breath sounds: Normal breath sounds.  Abdominal:     Palpations: Abdomen is soft.     Tenderness: There is no abdominal tenderness.  Musculoskeletal:        General: Tenderness present.     Cervical back: Neck supple.     Right lower leg: Edema present.     Comments: Right lower extremity with erythema, mild tenderness to palpation, 1+ pitting edema, intact distal pulses, mild warmth  Skin:    General: Skin is warm and dry.     Capillary Refill: Capillary refill takes less than 2 seconds.  Neurological:     Mental Status: She is alert.  Psychiatric:        Mood and Affect: Mood normal.     ED Results / Procedures / Treatments   Labs (all labs ordered are listed, but only abnormal results are displayed) Labs Reviewed  CBC WITH DIFFERENTIAL/PLATELET - Abnormal; Notable for the following components:      Result Value   WBC 11.9 (*)    RBC 3.83 (*)    Neutro Abs 9.2 (*)    Monocytes Absolute 1.2 (*)    All other components within normal limits  BASIC METABOLIC PANEL - Abnormal; Notable for the following components:   Sodium 132 (*)    All other components within normal limits    EKG None  Radiology US Venous Img Lower Unilateral Right Result Date: 11/10/2023 CLINICAL DATA:  Right leg swelling. EXAM: RIGHT LOWER EXTREMITY VENOUS DOPPLER ULTRASOUND TECHNIQUE: Gray-scale  sonography with compression, as well as color and duplex ultrasound, were performed to evaluate the deep venous system(s) from the level of the common femoral vein through the popliteal and proximal calf veins. COMPARISON:  None Available. FINDINGS: VENOUS Normal compressibility of the common femoral, superficial femoral, and popliteal veins, as well as the visualized calf veins. Visualized portions of profunda femoral vein and great saphenous vein unremarkable. No filling defects to suggest DVT on grayscale or color Doppler imaging. Doppler waveforms show normal direction of venous flow, normal respiratory plasticity and response to augmentation. Limited views of the contralateral common femoral vein are unremarkable. OTHER None. Limitations: none IMPRESSION: Negative. Electronically Signed   By: Rhea Belton  Ramiro Harvest M.D.   On: 11/10/2023 12:05    Procedures Procedures    Medications Ordered in ED Medications - No data to display  ED Course/ Medical Decision Making/ A&P Clinical Course as of 11/10/23 1222  Sat Nov 10, 2023  1220 WBC(!): 11.9 [JL]    Clinical Course User Index [JL] Ernie Avena, MD                                 Medical Decision Making Amount and/or Complexity of Data Reviewed Labs: ordered. Decision-making details documented in ED Course.  Risk Prescription drug management.    89 year old female with medical history significant for GERD, rheumatoid arthritis, presenting to the emergency department with redness and swelling in her right leg.  The patient states that she recently underwent her first injection of Humira for treatment for rheumatoid arthritis which was prescribed by her rheumatologist.  She states that the injection was done in the abdomen.  She states that since Thursday she developed redness in the right lower extremity with associated swelling.  It is mildly warm and painful.  She denies any fevers or chills.  She denies any history of DVT and is not on  anticoagulation.  On arrival, the patient was afebrile with stable, not tachycardic or tachypneic, hypertensive BP 171/88, saturating 97% on room air.  Physical exam revealed a right lower extremity that had pitting edema to the shin, intact distal pulses, erythema and warmth present.  Differential includes cellulitis versus DVT.  Labs: Mild leukocytosis to 11.9, mild hyponatremia to 132.  DVT US: Negative for DVT  Patient is overall well-appearing, nontoxic, stable vitals, low concern for necrotizing fasciitis.  Suspect likely mild cellulitis of the right lower extremity for which we will prescribe a course of doxycycline.  Patient advised to follow-up with her rheumatologist to determine whether she can continue taking Humira injections, advised outpatient follow-up to ensure resolution of her symptoms, return precautions provided.   Final Clinical Impression(s) / ED Diagnoses Final diagnoses:  Cellulitis of right lower extremity    Rx / DC Orders ED Discharge Orders          Ordered    doxycycline (VIBRAMYCIN) 100 MG capsule  2 times daily        11/10/23 1221              Ernie Avena, MD 11/10/23 1222

## 2023-11-10 NOTE — Discharge Instructions (Addendum)
Your DVT ultrasound was negative for a blood clot. Symptoms are likely due to mild cellulitis of the lower leg for which antibiotics have been prescribed. Recommend you follow-up with your rheumatologist to determine whether it is safe to continue taking Humira. Follow-up with your PCP to ensure resolution, return for any significant worsening of symptoms.

## 2023-11-10 NOTE — ED Notes (Signed)
MD at bedside.

## 2023-11-19 LAB — HM MAMMOGRAPHY

## 2023-11-20 ENCOUNTER — Encounter (HOSPITAL_BASED_OUTPATIENT_CLINIC_OR_DEPARTMENT_OTHER): Payer: PPO | Attending: Internal Medicine | Admitting: Internal Medicine

## 2023-11-20 DIAGNOSIS — S81801A Unspecified open wound, right lower leg, initial encounter: Secondary | ICD-10-CM | POA: Diagnosis not present

## 2023-11-20 DIAGNOSIS — X58XXXD Exposure to other specified factors, subsequent encounter: Secondary | ICD-10-CM | POA: Diagnosis not present

## 2023-11-20 DIAGNOSIS — I10 Essential (primary) hypertension: Secondary | ICD-10-CM | POA: Insufficient documentation

## 2023-11-20 DIAGNOSIS — S91301D Unspecified open wound, right foot, subsequent encounter: Secondary | ICD-10-CM | POA: Insufficient documentation

## 2023-11-20 DIAGNOSIS — L89893 Pressure ulcer of other site, stage 3: Secondary | ICD-10-CM | POA: Insufficient documentation

## 2023-11-20 DIAGNOSIS — I87311 Chronic venous hypertension (idiopathic) with ulcer of right lower extremity: Secondary | ICD-10-CM | POA: Diagnosis not present

## 2023-12-04 ENCOUNTER — Encounter (HOSPITAL_BASED_OUTPATIENT_CLINIC_OR_DEPARTMENT_OTHER): Payer: PPO | Admitting: Internal Medicine

## 2023-12-04 DIAGNOSIS — S91301D Unspecified open wound, right foot, subsequent encounter: Secondary | ICD-10-CM | POA: Diagnosis not present

## 2023-12-04 DIAGNOSIS — S81801A Unspecified open wound, right lower leg, initial encounter: Secondary | ICD-10-CM | POA: Diagnosis not present

## 2023-12-04 DIAGNOSIS — L89893 Pressure ulcer of other site, stage 3: Secondary | ICD-10-CM | POA: Diagnosis not present

## 2023-12-04 DIAGNOSIS — I87311 Chronic venous hypertension (idiopathic) with ulcer of right lower extremity: Secondary | ICD-10-CM | POA: Diagnosis not present

## 2023-12-05 ENCOUNTER — Encounter: Payer: Self-pay | Admitting: Obstetrics and Gynecology

## 2023-12-10 DIAGNOSIS — M459 Ankylosing spondylitis of unspecified sites in spine: Secondary | ICD-10-CM | POA: Diagnosis not present

## 2023-12-10 NOTE — Progress Notes (Signed)
 89 y.o. G65P2001 Married Caucasian female here for a breast and pelvic exam.    The patient is also followed for estrogen transdermal patch.  Not sexually active, so she stopped her vaginal estrogen and testosterone.   Has had colitis and cellulitis.  Being treated at the wound center.   PCP: Catalina Gravel, NP-C   No LMP recorded. Patient has had a hysterectomy.           Sexually active: No.  The current method of family planning is status post hysterectomy.    Menopausal hormone therapy:  vivelle-dot Exercising: No.   Smoker:  former  OB History     Gravida  2   Para  2   Term  2   Preterm      AB      Living  1      SAB      IAB      Ectopic      Multiple      Live Births  2           HEALTH MAINTENANCE: Last 2 paps: 04/2008 neg History of abnormal Pap or positive HPV:  no Mammogram:  11/19/23 Breast Density Cat B, BI-RADS CAT 1 neg Colonoscopy:  n/a Bone Density:  01/17/16  Result  WNL   Immunization History  Administered Date(s) Administered   Fluad Quad(high Dose 65+) 06/13/2019   Influenza Split 07/24/2009, 07/02/2010, 10/06/2011, 07/29/2013, 06/15/2014, 08/02/2015, 07/19/2016, 06/17/2019   Influenza, High Dose Seasonal PF 06/29/2018   Influenza-Unspecified 06/26/2012   PFIZER(Purple Top)SARS-COV-2 Vaccination 10/30/2019, 11/18/2019, 06/23/2020   Pneumococcal Conjugate-13 10/30/2016   Pneumococcal Polysaccharide-23 06/13/2010, 10/04/2015   Td 10/16/2002   Tdap 10/16/2008, 06/13/2010, 08/16/2020   Zoster Recombinant(Shingrix) 02/04/2018   Zoster, Live 06/26/2012, 06/19/2017, 02/04/2018      reports that she has quit smoking. Her smoking use included cigarettes. She has a 45 pack-year smoking history. She has never used smokeless tobacco. She reports current alcohol use of about 7.0 standard drinks of alcohol per week. She reports that she does not use drugs.  Past Medical History:  Diagnosis Date   Arthritis    "osteoarthritis"    Diarrhea 08/28/11   "for the last 17 days; from samonella poisoning"   GERD (gastroesophageal reflux disease)    Headache(784.0) 08/28/11   "just for the last few days; releated to dehydration"   Hearing aid worn    Shortness of breath 08/28/11   "hard time breathing deeply because of the pain"    Past Surgical History:  Procedure Laterality Date   BACK SURGERY  10/2004   "spinal stenosis"   CATARACT EXTRACTION W/ INTRAOCULAR LENS  IMPLANT, BILATERAL  Summer 2011   FACIAL COSMETIC SURGERY  ~ 2002   FINGER SURGERY  ~ 2000   "titanium rod in right pointer; from arthritis"   FOOT SURGERY Right    KNEE ARTHROSCOPY Right    4/23   SHOULDER SURGERY Left 03/2018   TONSILLECTOMY  1943   TUBAL LIGATION  1970's   VAGINAL HYSTERECTOMY  11/2002   "still have my ovaries"    Current Outpatient Medications  Medication Sig Dispense Refill   Acetaminophen (TYLENOL EXTRA STRENGTH PO) Take by mouth.     B Complex Vitamins (B-COMPLEX/B-12 PO) Take by mouth daily.     betamethasone dipropionate 0.05 % cream SMARTSIG:Sparingly Topical Twice Daily     Biotin 10 MG TABS Take by mouth.     Calcium Citrate (CITRACAL PO) Take by mouth.  diclofenac sodium (VOLTAREN) 1 % GEL      estradiol (VIVELLE-DOT) 0.0375 MG/24HR APPLY 1/2 PATCH TOPICALLY TO THE SKIN 2 TIMES A WEEK 12 patch 0   HUMIRA, 2 PEN, 40 MG/0.4ML pen SMARTSIG:40 Milligram(s) SUB-Q Every 2 Weeks     hydroxychloroquine (PLAQUENIL) 200 MG tablet Take 200 mg by mouth daily.     ipratropium (ATROVENT) 0.06 % nasal spray SMARTSIG:2 Spray(s) Both Nares 3 Times Daily PRN     metoprolol succinate (TOPROL-XL) 25 MG 24 hr tablet Takes 1/2     Multiple Vitamin (MULTIVITAMIN ADULT) TABS      OVER THE COUNTER MEDICATION Place 1 drop into both eyes 2 (two) times daily as needed (redness/ dry eyes). Over the counter eye drops     oxyCODONE (OXY IR/ROXICODONE) 5 MG immediate release tablet Take 5 mg by mouth at bedtime as needed.     pantoprazole  (PROTONIX) 20 MG tablet Take 20 mg by mouth daily.     RESTASIS 0.05 % ophthalmic emulsion INT 1 GTT INTO OU BID     valACYclovir (VALTREX) 1000 MG tablet as needed.      zolpidem (AMBIEN) 10 MG tablet Take 5-10 mg by mouth at bedtime as needed.     No current facility-administered medications for this visit.    ALLERGIES: Sulfa antibiotics and Latex  Family History  Problem Relation Age of Onset   Cancer Father        colon    Review of Systems  All other systems reviewed and are negative.   PHYSICAL EXAM:  BP 130/80 (BP Location: Left Arm, Patient Position: Sitting, Cuff Size: Small)   Ht 5\' 4"  (1.626 m)   Wt 107 lb (48.5 kg)   BMI 18.37 kg/m     General appearance: alert, cooperative and appears stated age Head: normocephalic, without obvious abnormality, atraumatic Neck: no adenopathy, supple, symmetrical, trachea midline and thyroid normal to inspection and palpation Lungs: clear to auscultation bilaterally Breasts: normal appearance, no masses or tenderness, No nipple retraction or dimpling, No nipple discharge or bleeding, No axillary adenopathy Heart: regular rate and rhythm Abdomen: soft, non-tender; no masses, no organomegaly Extremities: extremities normal, atraumatic, no cyanosis or edema Skin: skin color, texture, turgor normal. No rashes or lesions Lymph nodes: cervical, supraclavicular, and axillary nodes normal. Neurologic: grossly normal  Pelvic: External genitalia:  no lesions              No abnormal inguinal nodes palpated.              Urethra:  normal appearing urethra with no masses, tenderness or lesions              Bartholins and Skenes: normal                 Vagina: normal appearing vagina with normal color and discharge, no lesions              Cervix:  absent              Pap taken: No. Bimanual Exam:  Uterus:  absent              Adnexa: no mass, fullness, tenderness              Rectal exam: yes.  Confirms.              Anus:  normal  sphincter tone, no lesions  Chaperone was present for exam:  Warren Lacy, CMA  ASSESSMENT: Encounter for  breast and pelvic exam.  Other specified personal risk factors.  Status post TVH.  Ovaries remain.  Menopausal female. On ERT.  Medication monitoring encounter.  Hx HSV I.  Oral. Takes Valtrex prn through her dermatologist.   PLAN: Mammogram screening discussed. Self breast awareness reviewed. Pap and HRV collected:  no.  Not indicated. Guidelines for Calcium, Vitamin D, regular exercise program including cardiovascular and weight bearing exercise. Discused WHI and use of ERT which can increase risk of PE, DVT, and stroke. Medication refills:  Vivelle Dot 0.0375 mg, 1/2 patch twice weekly.  #12, RF 3.  BMD at [facility].  Will fax order.  Follow up:  yearly and prn.    30 min  total time was spent for this patient encounter, including preparation, face-to-face counseling with the patient, coordination of care, and documentation of the encounter in addition to doing the breast and pelvic exam.

## 2023-12-24 ENCOUNTER — Ambulatory Visit (INDEPENDENT_AMBULATORY_CARE_PROVIDER_SITE_OTHER): Payer: PPO | Admitting: Obstetrics and Gynecology

## 2023-12-24 ENCOUNTER — Encounter: Payer: Self-pay | Admitting: Obstetrics and Gynecology

## 2023-12-24 VITALS — BP 130/80 | Ht 64.0 in | Wt 107.0 lb

## 2023-12-24 DIAGNOSIS — Z9189 Other specified personal risk factors, not elsewhere classified: Secondary | ICD-10-CM | POA: Diagnosis not present

## 2023-12-24 DIAGNOSIS — B009 Herpesviral infection, unspecified: Secondary | ICD-10-CM | POA: Diagnosis not present

## 2023-12-24 DIAGNOSIS — Z5181 Encounter for therapeutic drug level monitoring: Secondary | ICD-10-CM | POA: Diagnosis not present

## 2023-12-24 DIAGNOSIS — Z01419 Encounter for gynecological examination (general) (routine) without abnormal findings: Secondary | ICD-10-CM

## 2023-12-24 DIAGNOSIS — Z79899 Other long term (current) drug therapy: Secondary | ICD-10-CM

## 2023-12-24 DIAGNOSIS — Z780 Asymptomatic menopausal state: Secondary | ICD-10-CM

## 2023-12-24 MED ORDER — ESTRADIOL 0.0375 MG/24HR TD PTTW: MEDICATED_PATCH | TRANSDERMAL | 3 refills | Status: AC

## 2023-12-24 NOTE — Patient Instructions (Signed)
 EXERCISE AND DIET:  We recommended that you start or continue a regular exercise program for good health. Regular exercise means any activity that makes your heart beat faster and makes you sweat.  We recommend exercising at least 30 minutes per day at least 3 days a week, preferably 4 or 5.  We also recommend a diet low in fat and sugar.  Inactivity, poor dietary choices and obesity can cause diabetes, heart attack, stroke, and kidney damage, among others.    ALCOHOL AND SMOKING:  Women should limit their alcohol intake to no more than 7 drinks/beers/glasses of wine (combined, not each!) per week. Moderation of alcohol intake to this level decreases your risk of breast cancer and liver damage. And of course, no recreational drugs are part of a healthy lifestyle.  And absolutely no smoking or even second hand smoke. Most people know smoking can cause heart and lung diseases, but did you know it also contributes to weakening of your bones? Aging of your skin?  Yellowing of your teeth and nails?  CALCIUM AND VITAMIN D:  Adequate intake of calcium and Vitamin D are recommended.  The recommendations for exact amounts of these supplements seem to change often, but generally speaking 600 mg of calcium (either carbonate or citrate) and 800 units of Vitamin D per day seems prudent. Certain women may benefit from higher intake of Vitamin D.  If you are among these women, your doctor will have told you during your visit.    PAP SMEARS:  Pap smears, to check for cervical cancer or precancers,  have traditionally been done yearly, although recent scientific advances have shown that most women can have pap smears less often.  However, every woman still should have a physical exam from her gynecologist every year. It will include a breast check, inspection of the vulva and vagina to check for abnormal growths or skin changes, a visual exam of the cervix, and then an exam to evaluate the size and shape of the uterus and  ovaries.  And after 88 years of age, a rectal exam is indicated to check for rectal cancers. We will also provide age appropriate advice regarding health maintenance, like when you should have certain vaccines, screening for sexually transmitted diseases, bone density testing, colonoscopy, mammograms, etc.   MAMMOGRAMS:  All women over 52 years old should have a yearly mammogram. Many facilities now offer a "3D" mammogram, which may cost around $50 extra out of pocket. If possible,  we recommend you accept the option to have the 3D mammogram performed.  It both reduces the number of women who will be called back for extra views which then turn out to be normal, and it is better than the routine mammogram at detecting truly abnormal areas.    COLONOSCOPY:  Colonoscopy to screen for colon cancer is recommended for all women at age 33.  We know, you hate the idea of the prep.  We agree, BUT, having colon cancer and not knowing it is worse!!  Colon cancer so often starts as a polyp that can be seen and removed at colonscopy, which can quite literally save your life!  And if your first colonoscopy is normal and you have no family history of colon cancer, most women don't have to have it again for 10 years.  Once every ten years, you can do something that may end up saving your life, right?  We will be happy to help you get it scheduled when you are ready.  Be sure to check your insurance coverage so you understand how much it will cost.  It may be covered as a preventative service at no cost, but you should check your particular policy.    Calcium in Foods Calcium is a mineral in the body. Of all the minerals in your body, you have the most calcium. Most of the body's calcium supply is stored in bones and teeth. Calcium helps many parts of the body work, including: Blood and blood vessels. Nerves. Hormones. Muscles. Bones and teeth. When your calcium stores are low, you may be at risk for low bone mass, bone  loss, and broken bones. When you get enough calcium, it helps to support strong bones and teeth throughout your life. Calcium is especially important for: Children during growth spurts. Females during adolescence. Females who are pregnant or breastfeeding. Females after their menstrual cycle stops (postmenopausal). Females whose menstrual cycle has stopped because of an eating disorder or regular intense exercise. People who can't eat or digest dairy products. People who eat a vegan diet. Recommended daily amounts of calcium: Females (ages 5 to 24): 1,000 mg per day. Females (ages 42 and older): 1,200 mg per day. Males (ages 89 to 70): 1,000 mg per day. Males (ages 70 and older): 1,200 mg per day. Females (ages 40 to 31): 1,300 mg per day. Males (ages 87 to 15): 1,300 mg per day. General information Eat foods that are high in calcium. Try to get most of your calcium from food. Some people may benefit from taking calcium supplements. Check with your health care provider or an expert in healthy eating called a dietitian before starting any calcium supplements. Calcium supplements may interact with certain medicines. Too much calcium may cause other health problems, such as trouble pooping and kidney stones. For the body to absorb calcium, it needs vitamin D. Sources of vitamin D include: Skin exposure to direct sunlight. Foods, such as egg yolks, liver, mushrooms, saltwater fish, and fortified milk. Vitamin D supplements. Check with your provider or dietitian before starting any vitamin D supplements. The amount of calcium that is absorbed in the body varies with type of food. Talk to a dietitian about what foods are best for you, especially if you are eat a vegan diet or don't eat dairy. What foods are high in calcium?  Foods that are high in calcium contain more than 100 milligrams per serving. Fruits Fortified orange juice or other fruit juice, 300 mg per 8 oz (237 mL)  serving. Vegetables Collard greens, 260 mg per 1 cup (130 g) serving, cooked. Kale, 180 mg per 1 cup (118 g) serving, cooked. Bok choy, 180 mg per 1 cup (170 g) serving, cooked Grains Fortified frozen waffles, 200 mg in 2 waffles. Oatmeal, 180 mg in 1 cup (234 g) serving, cooked. Fortified white bread, 175 mg per slice. Meats and other proteins Sardines, canned with bones, 350 mg per 3.75 oz (92 g) serving. Salmon, canned with bones, 168 mg per 3 oz (85 g) serving. Canned shrimp, 125 mg per 3 oz (85 g) serving. Baked beans, 120 mg per 1 cup (266 g) serving. Tofu, firm, made with calcium sulfate, 861 mg per  cup (126 g) serving. Dairy Yogurt, plain, low-fat, 448 mg per 1 cup (245 g) serving Nonfat milk, 300 mg per 1 cup (245 g) serving. American cheese, 145 mg per 1 oz (21 g) serving or 1 slice. Cheddar cheese, 200 mg per 1 oz (28 g) serving or 1 slice. [employer]  cheese 2%, 125 mg per  cup (113 g) serving. Fortified soy, rice, or almond milk, 300 mg per 1 cup (237 mL) serving. Mozzarella, part skim, 210 mg per 1 oz (21 g) serving. The items listed above may not be a complete list of foods high in calcium. Actual amounts of calcium may be different depending on processing. Contact a dietitian for more information. What foods are lower in calcium? Foods that are lower in calcium contain 50 mg or less per serving. Fruits Apple, 1 medium, about 6 mg. Banana, 1 medium, about 12 mg. Vegetables Lettuce, 19 mg per 1 cup (35 g) serving. Tomato, 1 small, about 11 mg. Grains Rice, white, 8 mg per  cup (79 g) serving. Boiled potatoes, 14 mg per 1 cup (160 g) serving. White bread, 6 mg per slice. Meats and other proteins Egg, 24 mg per 1 egg (50 g). Red meat, 7 mg per 4 oz (80 g) serving. Chicken, 17 mg per 4 oz (113 g) serving. Fish, cod, or trout, 20 mg per 4 oz (140 g) serving. Dairy Cream cheese, regular, 14 mg per 1 Tbsp (15 g) serving. Brie cheese, 50 mg per 1 oz (32 g)  serving. The items listed above may not be a complete list of foods lower in calcium. Actual amounts of calcium may be different depending on processing. Contact a dietitian for more information. This information is not intended to replace advice given to you by your health care provider. Make sure you discuss any questions you have with your health care provider. Document Revised: 06/30/2023 Document Reviewed: 06/30/2023 [employer] Patient Education  2024 [employer].

## 2023-12-25 DIAGNOSIS — H6123 Impacted cerumen, bilateral: Secondary | ICD-10-CM | POA: Diagnosis not present

## 2024-01-02 ENCOUNTER — Encounter (HOSPITAL_BASED_OUTPATIENT_CLINIC_OR_DEPARTMENT_OTHER): Payer: PPO | Admitting: Internal Medicine

## 2024-01-02 DIAGNOSIS — R000 Tachycardia, unspecified: Secondary | ICD-10-CM | POA: Diagnosis not present

## 2024-01-02 DIAGNOSIS — I10 Essential (primary) hypertension: Secondary | ICD-10-CM | POA: Diagnosis not present

## 2024-01-02 DIAGNOSIS — E559 Vitamin D deficiency, unspecified: Secondary | ICD-10-CM | POA: Diagnosis not present

## 2024-01-02 DIAGNOSIS — E871 Hypo-osmolality and hyponatremia: Secondary | ICD-10-CM | POA: Diagnosis not present

## 2024-01-02 DIAGNOSIS — E875 Hyperkalemia: Secondary | ICD-10-CM | POA: Diagnosis not present

## 2024-01-03 ENCOUNTER — Other Ambulatory Visit: Payer: Self-pay

## 2024-01-03 ENCOUNTER — Encounter: Payer: Self-pay | Admitting: Obstetrics and Gynecology

## 2024-01-03 MED ORDER — SODIUM POLYSTYRENE SULFONATE 15 GM/60ML CO SUSP
15.0000 g | Freq: Two times a day (BID) | 0 refills | Status: DC
  Filled 2024-01-03: qty 240, 2d supply, fill #0

## 2024-01-07 ENCOUNTER — Encounter (HOSPITAL_BASED_OUTPATIENT_CLINIC_OR_DEPARTMENT_OTHER): Attending: Internal Medicine | Admitting: Internal Medicine

## 2024-01-07 DIAGNOSIS — S81801A Unspecified open wound, right lower leg, initial encounter: Secondary | ICD-10-CM | POA: Diagnosis not present

## 2024-01-07 DIAGNOSIS — I87311 Chronic venous hypertension (idiopathic) with ulcer of right lower extremity: Secondary | ICD-10-CM | POA: Diagnosis not present

## 2024-01-07 DIAGNOSIS — S91301D Unspecified open wound, right foot, subsequent encounter: Secondary | ICD-10-CM | POA: Insufficient documentation

## 2024-01-07 DIAGNOSIS — I10 Essential (primary) hypertension: Secondary | ICD-10-CM | POA: Diagnosis not present

## 2024-01-07 DIAGNOSIS — L89893 Pressure ulcer of other site, stage 3: Secondary | ICD-10-CM | POA: Diagnosis not present

## 2024-01-26 ENCOUNTER — Other Ambulatory Visit: Payer: Self-pay

## 2024-01-26 ENCOUNTER — Emergency Department (HOSPITAL_BASED_OUTPATIENT_CLINIC_OR_DEPARTMENT_OTHER)
Admission: EM | Admit: 2024-01-26 | Discharge: 2024-01-26 | Disposition: A | Discharge status: Home / Self Care | Attending: Emergency Medicine | Admitting: Emergency Medicine

## 2024-01-26 ENCOUNTER — Emergency Department (HOSPITAL_BASED_OUTPATIENT_CLINIC_OR_DEPARTMENT_OTHER)

## 2024-01-26 ENCOUNTER — Encounter (HOSPITAL_BASED_OUTPATIENT_CLINIC_OR_DEPARTMENT_OTHER): Payer: Self-pay | Admitting: Emergency Medicine

## 2024-01-26 DIAGNOSIS — R197 Diarrhea, unspecified: Secondary | ICD-10-CM | POA: Diagnosis not present

## 2024-01-26 DIAGNOSIS — R42 Dizziness and giddiness: Secondary | ICD-10-CM | POA: Insufficient documentation

## 2024-01-26 DIAGNOSIS — X58XXXA Exposure to other specified factors, initial encounter: Secondary | ICD-10-CM | POA: Insufficient documentation

## 2024-01-26 DIAGNOSIS — J432 Centrilobular emphysema: Secondary | ICD-10-CM | POA: Insufficient documentation

## 2024-01-26 DIAGNOSIS — E860 Dehydration: Secondary | ICD-10-CM | POA: Insufficient documentation

## 2024-01-26 DIAGNOSIS — R220 Localized swelling, mass and lump, head: Secondary | ICD-10-CM | POA: Diagnosis not present

## 2024-01-26 DIAGNOSIS — R55 Syncope and collapse: Secondary | ICD-10-CM | POA: Diagnosis not present

## 2024-01-26 DIAGNOSIS — N3289 Other specified disorders of bladder: Secondary | ICD-10-CM | POA: Diagnosis not present

## 2024-01-26 DIAGNOSIS — S0990XA Unspecified injury of head, initial encounter: Secondary | ICD-10-CM | POA: Insufficient documentation

## 2024-01-26 DIAGNOSIS — M5031 Other cervical disc degeneration,  high cervical region: Secondary | ICD-10-CM | POA: Diagnosis not present

## 2024-01-26 DIAGNOSIS — S0181XA Laceration without foreign body of other part of head, initial encounter: Secondary | ICD-10-CM | POA: Diagnosis not present

## 2024-01-26 DIAGNOSIS — I6782 Cerebral ischemia: Secondary | ICD-10-CM | POA: Insufficient documentation

## 2024-01-26 DIAGNOSIS — Z90710 Acquired absence of both cervix and uterus: Secondary | ICD-10-CM | POA: Diagnosis not present

## 2024-01-26 DIAGNOSIS — M50323 Other cervical disc degeneration at C6-C7 level: Secondary | ICD-10-CM | POA: Diagnosis not present

## 2024-01-26 DIAGNOSIS — S01111A Laceration without foreign body of right eyelid and periocular area, initial encounter: Secondary | ICD-10-CM | POA: Diagnosis not present

## 2024-01-26 DIAGNOSIS — S0993XA Unspecified injury of face, initial encounter: Secondary | ICD-10-CM | POA: Diagnosis present

## 2024-01-26 DIAGNOSIS — R109 Unspecified abdominal pain: Secondary | ICD-10-CM | POA: Diagnosis not present

## 2024-01-26 DIAGNOSIS — S199XXA Unspecified injury of neck, initial encounter: Secondary | ICD-10-CM | POA: Diagnosis not present

## 2024-01-26 HISTORY — DX: Essential (primary) hypertension: I10

## 2024-01-26 HISTORY — DX: Raynaud's syndrome without gangrene: I73.00

## 2024-01-26 LAB — CBC
HCT: 40.7 % (ref 36.0–46.0)
Hemoglobin: 14 g/dL (ref 12.0–15.0)
MCH: 32.6 pg (ref 26.0–34.0)
MCHC: 34.4 g/dL (ref 30.0–36.0)
MCV: 94.7 fL (ref 80.0–100.0)
Platelets: 330 10*3/uL (ref 150–400)
RBC: 4.3 MIL/uL (ref 3.87–5.11)
RDW: 13.3 % (ref 11.5–15.5)
WBC: 9.9 10*3/uL (ref 4.0–10.5)
nRBC: 0 % (ref 0.0–0.2)

## 2024-01-26 LAB — URINALYSIS, ROUTINE W REFLEX MICROSCOPIC
Bacteria, UA: NONE SEEN
Bilirubin Urine: NEGATIVE
Glucose, UA: NEGATIVE mg/dL
Ketones, ur: 15 mg/dL — AB
Leukocytes,Ua: NEGATIVE
Nitrite: NEGATIVE
Protein, ur: 30 mg/dL — AB
Specific Gravity, Urine: >1.046 — ABNORMAL HIGH (ref 1.005–1.030)
pH: 5.5 (ref 5.0–8.0)

## 2024-01-26 LAB — COMPREHENSIVE METABOLIC PANEL WITH GFR
ALT: 36 U/L (ref 0–44)
AST: 32 U/L (ref 15–41)
Albumin: 3.5 g/dL (ref 3.5–5.0)
Alkaline Phosphatase: 72 U/L (ref 38–126)
Anion gap: 12 (ref 5–15)
BUN: 50 mg/dL — ABNORMAL HIGH (ref 8–23)
CO2: 21 mmol/L — ABNORMAL LOW (ref 22–32)
Calcium: 9.1 mg/dL (ref 8.9–10.3)
Chloride: 100 mmol/L (ref 98–111)
Creatinine, Ser: 1.42 mg/dL — ABNORMAL HIGH (ref 0.44–1.00)
GFR, Estimated: 36 mL/min — ABNORMAL LOW (ref >60)
Glucose, Bld: 127 mg/dL — ABNORMAL HIGH (ref 70–99)
Potassium: 4.4 mmol/L (ref 3.5–5.1)
Sodium: 133 mmol/L — ABNORMAL LOW (ref 135–145)
Total Bilirubin: 1.2 mg/dL (ref 0.0–1.2)
Total Protein: 6.5 g/dL (ref 6.5–8.1)

## 2024-01-26 LAB — LIPASE, BLOOD: Lipase: 13 U/L (ref 11–51)

## 2024-01-26 MED ORDER — SODIUM CHLORIDE 0.9 % IV BOLUS
1000.0000 mL | Freq: Once | INTRAVENOUS | Status: AC
  Administered 2024-01-26: 1000 mL via INTRAVENOUS

## 2024-01-26 MED ORDER — SODIUM CHLORIDE 0.9 % IV BOLUS
500.0000 mL | Freq: Once | INTRAVENOUS | Status: AC
  Administered 2024-01-26: 500 mL via INTRAVENOUS

## 2024-01-26 MED ORDER — IOHEXOL 300 MG/ML  SOLN
100.0000 mL | Freq: Once | INTRAMUSCULAR | Status: AC | PRN
  Administered 2024-01-26: 75 mL via INTRAVENOUS

## 2024-01-26 MED ORDER — ONDANSETRON 4 MG PO TBDP: 4.0000 mg | ORAL_TABLET | Freq: Three times a day (TID) | ORAL | 0 refills | Status: DC | PRN

## 2024-01-26 MED ORDER — LIDOCAINE-EPINEPHRINE (PF) 2 %-1:200000 IJ SOLN
10.0000 mL | Freq: Once | INTRAMUSCULAR | Status: AC
  Administered 2024-01-26: 10 mL
  Filled 2024-01-26: qty 20

## 2024-01-26 NOTE — ED Triage Notes (Signed)
 Diarrhea since Thursday, no appetite, not eating, poor fluid intake. Nauseated. No fever

## 2024-01-26 NOTE — ED Provider Notes (Signed)
 [place] EMERGENCY DEPARTMENT AT [facility] Provider Note   CSN: [id number] Arrival date & time: 01/26/24  9562     History  No chief complaint on file.   [name] is a 89 y.o. female.  Patient to ED for evaluation of diarrhea for the past 2 days. She reports TNTC episodes yesterday. No fever. She has cramping abdominal pain that is improved after a bowel movement. Unsure whether blood is present in the stool. No vomiting but she reports nausea. Husband has not been ill. She is lightheaded when she gets up to walk and last night fell forward while at the sink, hitting her forehead on the faucet causing a laceration. She is unsure whether she passed out.   The history is provided by the patient and the spouse. No language interpreter was used.       Home Medications Prior to Admission medications   Medication Sig Start Date End Date Taking? Authorizing Provider  ondansetron (ZOFRAN-ODT) 4 MG disintegrating tablet Take 1 tablet (4 mg total) by mouth every 8 (eight) hours as needed for nausea or vomiting. 01/26/24  Yes Lanny Lipkin, PA-C  Acetaminophen (TYLENOL EXTRA STRENGTH PO) Take by mouth.    [provider]  B Complex Vitamins (B-COMPLEX/B-12 PO) Take by mouth daily.    [provider]  betamethasone dipropionate 0.05 % cream SMARTSIG:Sparingly Topical Twice Daily 10/20/21   [provider]  Biotin 10 MG TABS Take by mouth.    [provider]  Calcium Citrate (CITRACAL PO) Take by mouth.    [provider]  diclofenac sodium (VOLTAREN) 1 % GEL  10/24/16   [provider]  estradiol (VIVELLE-DOT) 0.0375 MG/24HR APPLY 1/2 PATCH TOPICALLY TO THE SKIN 2 TIMES A WEEK 12/24/23   Amundson C Silva, Blondie Burke, MD  HUMIRA, 2 PEN, 40 MG/0.4ML pen SMARTSIG:40 Milligram(s) SUB-Q Every 2 Weeks 12/06/23   [provider]  hydroxychloroquine (PLAQUENIL) 200 MG tablet Take 200 mg by mouth daily. 08/25/19   [provider]  ipratropium (ATROVENT) 0.06 % nasal spray SMARTSIG:2 Spray(s) Both Nares 3 Times Daily PRN    [provider]  metoprolol succinate (TOPROL-XL) 25 MG 24 hr tablet Takes 1/2 05/21/19   [provider]  Multiple Vitamin (MULTIVITAMIN ADULT) TABS     [provider]  OVER THE COUNTER MEDICATION Place 1 drop into both eyes 2 (two) times daily as needed (redness/ dry eyes). Over the counter eye drops    [provider]  oxyCODONE (OXY IR/ROXICODONE) 5 MG immediate release tablet Take 5 mg by mouth at bedtime as needed. 06/15/20   [provider]  pantoprazole (PROTONIX) 20 MG tablet Take 20 mg by mouth daily. 10/24/21   [provider]  RESTASIS 0.05 % ophthalmic emulsion INT 1 GTT INTO OU BID 11/12/18   [provider]  sodium polystyrene (SPS, SODIUM POLYSTYRENE SULF,) 15 GM/60ML suspension Take 60 mLs (1 bottle)  by mouth every 12 (twelve) hours for 2 days. 01/03/24     valACYclovir (VALTREX) 1000 MG tablet as needed.  01/06/17   [provider]  zolpidem (AMBIEN) 10 MG tablet Take 5-10 mg by mouth at bedtime as needed. 02/13/22   [provider]      Allergies    Sulfa antibiotics and Latex    Review of Systems   Review of Systems  Physical Exam Updated Vital Signs BP (!) 108/56   Pulse 71   Temp 97.8 F (36.6 C)  Resp (!) 21   Wt 47.6 kg   SpO2 94%   BMI 18.02 kg/m  Physical Exam Vitals and nursing note reviewed.  Constitutional:      Appearance: Normal appearance.  HENT:     Head: Normocephalic.     Nose: Nose normal.  Eyes:     Pupils: Pupils are equal, round, and reactive to light.  Neck:     Comments: No midline cervical tenderness Cardiovascular:     Rate and Rhythm: Normal rate.  Pulmonary:     Effort: Pulmonary effort is normal.  Abdominal:     Tenderness: There is abdominal tenderness (Diffuse). There is guarding.  Musculoskeletal:        General: Normal range of motion.      Cervical back: Normal range of motion and neck supple.  Skin:    General: Skin is warm and dry.     Comments: 2 cm full thickness laceration above right medial eyebrow.  Neurological:     Mental Status: She is alert and oriented to person, place, and time.     ED Results / Procedures / Treatments   Labs (all labs ordered are listed, but only abnormal results are displayed) Labs Reviewed  COMPREHENSIVE METABOLIC PANEL WITH GFR - Abnormal; Notable for the following components:      Result Value   Sodium 133 (*)    CO2 21 (*)    Glucose, Bld 127 (*)    BUN 50 (*)    Creatinine, Ser 1.42 (*)    GFR, Estimated 36 (*)    All other components within normal limits  URINALYSIS, ROUTINE W REFLEX MICROSCOPIC - Abnormal; Notable for the following components:   Specific Gravity, Urine >1.046 (*)    Hgb urine dipstick TRACE (*)    Ketones, ur 15 (*)    Protein, ur 30 (*)    All other components within normal limits  GASTROINTESTINAL PANEL BY PCR, STOOL (REPLACES STOOL CULTURE)  C DIFFICILE QUICK SCREEN W PCR REFLEX    LIPASE, BLOOD  CBC   Results for orders placed or performed during the hospital encounter of 01/26/24  Lipase, blood   Collection Time: 01/26/24  9:41 AM  Result Value Ref Range   Lipase 13 11 - 51 U/L  Comprehensive metabolic panel   Collection Time: 01/26/24  9:41 AM  Result Value Ref Range   Sodium 133 (L) 135 - 145 mmol/L   Potassium 4.4 3.5 - 5.1 mmol/L   Chloride 100 98 - 111 mmol/L   CO2 21 (L) 22 - 32 mmol/L   Glucose, Bld 127 (H) 70 - 99 mg/dL   BUN 50 (H) 8 - 23 mg/dL   Creatinine, Ser 4.09 (H) 0.44 - 1.00 mg/dL   Calcium 9.1 8.9 - 81.1 mg/dL   Total Protein 6.5 6.5 - 8.1 g/dL   Albumin 3.5 3.5 - 5.0 g/dL   AST 32 15 - 41 U/L   ALT 36 0 - 44 U/L   Alkaline Phosphatase 72 38 - 126 U/L   Total Bilirubin 1.2 0.0 - 1.2 mg/dL   GFR, Estimated 36 (L) >60 mL/min   Anion gap 12 5 - 15  CBC   Collection Time: 01/26/24  9:41 AM  Result Value Ref Range    WBC 9.9 4.0 - 10.5 K/uL   RBC 4.30 3.87 - 5.11 MIL/uL   Hemoglobin 14.0 12.0 - 15.0 g/dL   HCT 91.4 78.2 - 95.6 %   MCV 94.7 80.0 - 100.0 fL  MCH 32.6 26.0 - 34.0 pg   MCHC 34.4 30.0 - 36.0 g/dL   RDW 96.0 45.4 - 09.8 %   Platelets 330 150 - 400 K/uL   nRBC 0.0 0.0 - 0.2 %  Urinalysis, Routine w reflex microscopic -Urine, Clean Catch   Collection Time: 01/26/24  9:41 AM  Result Value Ref Range   Color, Urine YELLOW YELLOW   APPearance CLEAR CLEAR   Specific Gravity, Urine >1.046 (H) 1.005 - 1.030   pH 5.5 5.0 - 8.0   Glucose, UA NEGATIVE NEGATIVE mg/dL   Hgb urine dipstick TRACE (A) NEGATIVE   Bilirubin Urine NEGATIVE NEGATIVE   Ketones, ur 15 (A) NEGATIVE mg/dL   Protein, ur 30 (A) NEGATIVE mg/dL   Nitrite NEGATIVE NEGATIVE   Leukocytes,Ua NEGATIVE NEGATIVE   RBC / HPF 6-10 0 - 5 RBC/hpf   WBC, UA 0-5 0 - 5 WBC/hpf   Bacteria, UA NONE SEEN NONE SEEN   Squamous Epithelial / HPF 11-20 0 - 5 /HPF   Hyaline Casts, UA PRESENT    UA   EKG EKG Interpretation Date/Time:  Saturday January 26 2024 09:47:44 EDT Ventricular Rate:  77 PR Interval:  169 QRS Duration:  100 QT Interval:  380 QTC Calculation: 430 R Axis:   55  Text Interpretation: Sinus rhythm Low voltage, precordial leads Borderline repol abnrm, inferolateral leads Confirmed by Auston Blush [phone]) on 01/26/2024 2:43:07 PM  Radiology CT Head Wo Contrast Result Date: 01/26/2024 CLINICAL DATA:  Neck trauma (Age >= 65y); Head trauma, minor (Age >= 65y). EXAM: CT HEAD WITHOUT CONTRAST CT CERVICAL SPINE WITHOUT CONTRAST TECHNIQUE: Multidetector CT imaging of the head and cervical spine was performed following the standard protocol without intravenous contrast. Multiplanar CT image reconstructions of the cervical spine were also generated. RADIATION DOSE REDUCTION: This exam was performed according to the departmental dose-optimization program which includes automated exposure control, adjustment of the mA and/or kV according  to patient size and/or use of iterative reconstruction technique. COMPARISON:  CT head 04/23/2023 FINDINGS: CT HEAD FINDINGS Brain: There is no evidence of an acute infarct, intracranial hemorrhage, mass, midline shift, or extra-axial fluid collection. There is mild cerebral atrophy. Patchy hypodensities in the cerebral white matter bilaterally are similar to the prior CT and are nonspecific but compatible with moderate chronic small vessel ischemic disease. Vascular: No hyperdense vessel or unexpected calcification. Skull: No acute fracture or suspicious lesion. Sinuses/Orbits: Visualized paranasal sinuses are clear. Trace bilateral mastoid fluid. Bilateral cataract extraction. Other: Laceration and soft tissue swelling in the right supraorbital region. CT CERVICAL SPINE FINDINGS Alignment: Grade 1 anterolisthesis of C2 on C3, C7 on T1, and T2 on T3 and grade 1 retrolisthesis of C4 on C5. Skull base and vertebrae: No acute fracture or suspicious lesion. Soft tissues and spinal canal: No prevertebral fluid or swelling. No visible canal hematoma. Disc levels: Advanced disc degeneration from C3-4 through C6-7. Interbody ankylosis and partial left facet ankylosis at C5-6. At least moderate spinal stenosis at C4-5 and C5-6. Severe right neural foraminal stenosis at C4-5 and C6-7 and severe left neural foraminal stenosis at C3-4. Upper chest: Mild centrilobular emphysema. Other: None. IMPRESSION: 1. No evidence of acute intracranial abnormality or cervical spine fracture. 2. Moderate chronic small vessel ischemic disease. Electronically Signed   By: Aundra Lee M.D.   On: 01/26/2024 11:05   CT Cervical Spine Wo Contrast Result Date: 01/26/2024 CLINICAL DATA:  Neck trauma (Age >= 65y); Head trauma, minor (Age >= 65y). EXAM: CT HEAD WITHOUT  CONTRAST CT CERVICAL SPINE WITHOUT CONTRAST TECHNIQUE: Multidetector CT imaging of the head and cervical spine was performed following the standard protocol without intravenous  contrast. Multiplanar CT image reconstructions of the cervical spine were also generated. RADIATION DOSE REDUCTION: This exam was performed according to the departmental dose-optimization program which includes automated exposure control, adjustment of the mA and/or kV according to patient size and/or use of iterative reconstruction technique. COMPARISON:  CT head 04/23/2023 FINDINGS: CT HEAD FINDINGS Brain: There is no evidence of an acute infarct, intracranial hemorrhage, mass, midline shift, or extra-axial fluid collection. There is mild cerebral atrophy. Patchy hypodensities in the cerebral white matter bilaterally are similar to the prior CT and are nonspecific but compatible with moderate chronic small vessel ischemic disease. Vascular: No hyperdense vessel or unexpected calcification. Skull: No acute fracture or suspicious lesion. Sinuses/Orbits: Visualized paranasal sinuses are clear. Trace bilateral mastoid fluid. Bilateral cataract extraction. Other: Laceration and soft tissue swelling in the right supraorbital region. CT CERVICAL SPINE FINDINGS Alignment: Grade 1 anterolisthesis of C2 on C3, C7 on T1, and T2 on T3 and grade 1 retrolisthesis of C4 on C5. Skull base and vertebrae: No acute fracture or suspicious lesion. Soft tissues and spinal canal: No prevertebral fluid or swelling. No visible canal hematoma. Disc levels: Advanced disc degeneration from C3-4 through C6-7. Interbody ankylosis and partial left facet ankylosis at C5-6. At least moderate spinal stenosis at C4-5 and C5-6. Severe right neural foraminal stenosis at C4-5 and C6-7 and severe left neural foraminal stenosis at C3-4. Upper chest: Mild centrilobular emphysema. Other: None. IMPRESSION: 1. No evidence of acute intracranial abnormality or cervical spine fracture. 2. Moderate chronic small vessel ischemic disease. Electronically Signed   By: Aundra Lee M.D.   On: 01/26/2024 11:05   CT ABDOMEN PELVIS W CONTRAST Result Date:  01/26/2024 CLINICAL DATA:  Acute abdominal pain EXAM: CT ABDOMEN AND PELVIS WITH CONTRAST TECHNIQUE: Multidetector CT imaging of the abdomen and pelvis was performed using the standard protocol following bolus administration of intravenous contrast. RADIATION DOSE REDUCTION: This exam was performed according to the departmental dose-optimization program which includes automated exposure control, adjustment of the mA and/or kV according to patient size and/or use of iterative reconstruction technique. CONTRAST:  75mL OMNIPAQUE IOHEXOL 300 MG/ML  SOLN COMPARISON:  07/07/2023 FINDINGS: Lower chest: No pleural or pericardial effusion. Visualized lung bases clear. Hepatobiliary: No focal liver abnormality is seen. No gallstones, gallbladder wall thickening, or biliary dilatation. Pancreas: Unremarkable. No pancreatic ductal dilatation or surrounding inflammatory changes. Spleen: Normal in size without focal abnormality. Adrenals/Urinary Tract: No adrenal mass. Right kidney is ptotic, with dilated extrarenal collecting system as before. Symmetric renal enhancement without focal lesion or evident urolithiasis. Urinary bladder partially distended. Stomach/Bowel: Stomach is decompressed. A few gas and fluid filled small bowel loops scattered throughout the abdomen pelvis. The colon is nondistended. Vascular/Lymphatic: Scattered aortoiliac calcified atheromatous plaque without aneurysm. Portal vein patent. No abdominal or pelvic adenopathy localized. Reproductive: Status post hysterectomy. No adnexal masses. Other: Pelvic phleboliths.  Trace perihepatic ascites.  No free air. Musculoskeletal: Multilevel lumbar spondylitic change with grade 1 anterolisthesis L3-4 and L4-5. IMPRESSION: 1. No acute findings. 2. Trace perihepatic ascites. Electronically Signed   By: Nicoletta Barrier M.D.   On: 01/26/2024 11:03    Procedures .Laceration Repair  Date/Time: 01/26/2024 9:33 AM  Performed by: Mandy Second, PA-C Authorized by:  Mandy Second, PA-C   Consent:    Consent obtained:  Verbal Universal protocol:    Procedure explained and questions  answered to patient or proxy's satisfaction: yes     Patient identity confirmed:  Verbally with patient Anesthesia:    Anesthesia method:  Local infiltration   Local anesthetic:  Lidocaine 2% WITH epi Laceration details:    Location:  Face   Face location:  R eyebrow   Length (cm):  3 Exploration:    Hemostasis achieved with:  Epinephrine   Imaging outcome: foreign body not noted     Wound exploration: entire depth of wound visualized     Contaminated: no   Treatment:    Area cleansed with:  Saline and povidone-iodine   Debridement:  Minimal Skin repair:    Repair method:  Sutures   Suture size:  6-0   Suture material:  Prolene   Suture technique:  Simple interrupted   Number of sutures:  9 Approximation:    Approximation:  Close Repair type:    Repair type:  Intermediate Post-procedure details:    Procedure completion:  Tolerated well, no immediate complications .Critical Care  Performed by: Mandy Second, PA-C Authorized by: Mandy Second, PA-C   Critical care provider statement:    Critical care time (minutes):  40   Critical care was necessary to treat or prevent imminent or life-threatening deterioration of the following conditions:  Dehydration   Critical care was time spent personally by me on the following activities:  Development of treatment plan with patient or surrogate, evaluation of patient's response to treatment, examination of patient, obtaining history from patient or surrogate, ordering and review of laboratory studies, ordering and review of radiographic studies, pulse oximetry, re-evaluation of patient's condition and review of old charts     Medications Ordered in ED Medications  sodium chloride 0.9 % bolus 1,000 mL (0 mLs Intravenous Stopped 01/26/24 1154)  lidocaine-EPINEPHrine (XYLOCAINE W/EPI) 2 %-1:200000 (PF) injection 10 mL  (10 mLs Infiltration Given by Other 01/26/24 0957)  iohexol (OMNIPAQUE) 300 MG/ML solution 100 mL (75 mLs Intravenous Contrast Given 01/26/24 1045)  sodium chloride 0.9 % bolus 500 mL (0 mLs Intravenous Stopped 01/26/24 1445)    ED Course/ Medical Decision Making/ A&P                                 Medical Decision Making This patient presents to the ED for concern of syncope and diarrhea, this involves an extensive number of treatment options, and is a complaint that carries with it a high risk of complications and morbidity.  The differential diagnosis includes infectious colitis, diverticulitis, dehydration, arrhythmia,    Co morbidities that complicate the patient evaluation  HTN, arthritis on Plaquenil, GERD   Additional history obtained:  Additional history and/or information obtained from chart review, notable for salmonella poisoning 2012   Lab Tests:  I Ordered, and personally interpreted labs.  The pertinent results include:  UA sp gr >0.46 c/w significant dehydration, no evidence infection; Cr. 1.42 (elevated) BUN 50, Na 133; no leukocytosis, normal hgb   Imaging Studies ordered:  I ordered imaging studies including CT head, c-spine and abd/pel Per radiologist interpretation: no acute findings   Cardiac Monitoring:  The patient was maintained on a cardiac monitor.  I personally viewed and interpreted the cardiac monitored which showed an underlying rhythm of: sinus rhythm    Test Considered:  Na/   Critical Interventions:  N/a   Consultations Obtained:  I requested consultation with the n/a,  and discussed lab and imaging findings as well  as pertinent plan - they recommend: n/a   Problem List / ED Course:  Presents for diarrhea x 4 days ? Syncope last night, hitting head on sink - eval for head injury Abdomen tender to palpation diffusely - consider CT abd/pel as part of work up   Reevaluation:  After the interventions noted above, I  reevaluated the patient and found that they have :stayed the same   Social Determinants of Health:  Lives with husband   Disposition:  After consideration of the diagnostic results and the patients response to treatment, I feel that the patient would benefit from discharge home AMA after patient was offered/recommended admission.   Amount and/or Complexity of Data Reviewed Labs: ordered. Radiology: ordered.  Risk Prescription drug management.           Final Clinical Impression(s) / ED Diagnoses Final diagnoses:  Diarrhea, unspecified type  Dehydration  Facial laceration, initial encounter  Postural dizziness with near syncope    Rx / DC Orders ED Discharge Orders          Ordered    ondansetron (ZOFRAN-ODT) 4 MG disintegrating tablet  Every 8 hours PRN        01/26/24 1439              Mandy Second, PA-C 01/26/24 1446    Auston Blush, MD 01/31/24 [phone]

## 2024-01-26 NOTE — ED Notes (Signed)
 Pt attempted to provide urine and stool sample. Pt successful with providing urine sample, unsuccessful with providing stool sample at this time.

## 2024-01-26 NOTE — Discharge Instructions (Signed)
 The facial sutures will need to be removed in 4-5 days. Your primary care provider can do this. Go sooner with any swelling, increasing pain or redness, or drainage from the wound.   You can use Imodium for diarrhea if having more than 5 bowel movements daily. Drink lots of fluids to improve dehydration. Take Zofran if needed for nausea.   You have decided against admission for rehydration and stabilization before going home. Please return to the hospital at any time you change your mind and want further care.

## 2024-01-26 NOTE — ED Notes (Signed)
 Pt provided supplies to collect stool sample at home, instructed on how to collect. Pt and family verbalized understanding and had no further questions at time of discharge.

## 2024-01-28 ENCOUNTER — Emergency Department (HOSPITAL_BASED_OUTPATIENT_CLINIC_OR_DEPARTMENT_OTHER)
Admission: EM | Admit: 2024-01-28 | Discharge: 2024-01-28 | Disposition: A | Discharge status: Home / Self Care | Attending: Emergency Medicine | Admitting: Emergency Medicine

## 2024-01-28 ENCOUNTER — Encounter (HOSPITAL_BASED_OUTPATIENT_CLINIC_OR_DEPARTMENT_OTHER): Payer: Self-pay | Admitting: Emergency Medicine

## 2024-01-28 ENCOUNTER — Other Ambulatory Visit: Payer: Self-pay

## 2024-01-28 DIAGNOSIS — R197 Diarrhea, unspecified: Secondary | ICD-10-CM | POA: Diagnosis not present

## 2024-01-28 DIAGNOSIS — R140 Abdominal distension (gaseous): Secondary | ICD-10-CM | POA: Insufficient documentation

## 2024-01-28 DIAGNOSIS — Z91040 Latex allergy status: Secondary | ICD-10-CM | POA: Diagnosis not present

## 2024-01-28 DIAGNOSIS — Z87891 Personal history of nicotine dependence: Secondary | ICD-10-CM | POA: Diagnosis not present

## 2024-01-28 DIAGNOSIS — I10 Essential (primary) hypertension: Secondary | ICD-10-CM | POA: Diagnosis not present

## 2024-01-28 DIAGNOSIS — Z79899 Other long term (current) drug therapy: Secondary | ICD-10-CM | POA: Insufficient documentation

## 2024-01-28 LAB — COMPREHENSIVE METABOLIC PANEL WITH GFR
ALT: 29 U/L (ref 0–44)
AST: 29 U/L (ref 15–41)
Albumin: 3.3 g/dL — ABNORMAL LOW (ref 3.5–5.0)
Alkaline Phosphatase: 70 U/L (ref 38–126)
Anion gap: 11 (ref 5–15)
BUN: 45 mg/dL — ABNORMAL HIGH (ref 8–23)
CO2: 22 mmol/L (ref 22–32)
Calcium: 9.2 mg/dL (ref 8.9–10.3)
Chloride: 103 mmol/L (ref 98–111)
Creatinine, Ser: 1.24 mg/dL — ABNORMAL HIGH (ref 0.44–1.00)
GFR, Estimated: 42 mL/min — ABNORMAL LOW (ref >60)
Glucose, Bld: 99 mg/dL (ref 70–99)
Potassium: 3.8 mmol/L (ref 3.5–5.1)
Sodium: 136 mmol/L (ref 135–145)
Total Bilirubin: 0.6 mg/dL (ref 0.0–1.2)
Total Protein: 6.1 g/dL — ABNORMAL LOW (ref 6.5–8.1)

## 2024-01-28 LAB — CBC WITH DIFFERENTIAL/PLATELET
Abs Immature Granulocytes: 0.1 10*3/uL — ABNORMAL HIGH (ref 0.00–0.07)
Basophils Absolute: 0 10*3/uL (ref 0.0–0.1)
Basophils Relative: 1 %
Eosinophils Absolute: 0.1 10*3/uL (ref 0.0–0.5)
Eosinophils Relative: 2 %
HCT: 40.7 % (ref 36.0–46.0)
Hemoglobin: 14 g/dL (ref 12.0–15.0)
Immature Granulocytes: 2 %
Lymphocytes Relative: 24 %
Lymphs Abs: 1.3 10*3/uL (ref 0.7–4.0)
MCH: 32.6 pg (ref 26.0–34.0)
MCHC: 34.4 g/dL (ref 30.0–36.0)
MCV: 94.7 fL (ref 80.0–100.0)
Monocytes Absolute: 0.7 10*3/uL (ref 0.1–1.0)
Monocytes Relative: 13 %
Neutro Abs: 3.2 10*3/uL (ref 1.7–7.7)
Neutrophils Relative %: 58 %
Platelets: 360 10*3/uL (ref 150–400)
RBC: 4.3 MIL/uL (ref 3.87–5.11)
RDW: 13.3 % (ref 11.5–15.5)
WBC: 5.6 10*3/uL (ref 4.0–10.5)
nRBC: 0 % (ref 0.0–0.2)

## 2024-01-28 LAB — LIPASE, BLOOD: Lipase: 13 U/L (ref 11–51)

## 2024-01-28 MED ORDER — METOCLOPRAMIDE HCL 5 MG/ML IJ SOLN
10.0000 mg | Freq: Once | INTRAMUSCULAR | Status: AC
  Administered 2024-01-28: 10 mg via INTRAVENOUS
  Filled 2024-01-28: qty 2

## 2024-01-28 MED ORDER — LACTATED RINGERS IV BOLUS
1000.0000 mL | Freq: Once | INTRAVENOUS | Status: AC
  Administered 2024-01-28: 1000 mL via INTRAVENOUS

## 2024-01-28 MED ORDER — METOCLOPRAMIDE HCL 10 MG PO TABS: 10.0000 mg | ORAL_TABLET | Freq: Two times a day (BID) | ORAL | 0 refills | Status: DC

## 2024-01-28 MED ORDER — ONDANSETRON HCL 4 MG/2ML IJ SOLN
4.0000 mg | Freq: Once | INTRAMUSCULAR | Status: AC
  Administered 2024-01-28: 4 mg via INTRAVENOUS
  Filled 2024-01-28: qty 2

## 2024-01-28 NOTE — ED Notes (Signed)
 Pt went to bathroom to give urine sample but states she was unable to provide one due to diarrhea

## 2024-01-28 NOTE — ED Triage Notes (Signed)
 Was seen here on Saturday for same. States symptoms have not improved so wanted to get rechecked. Denies Fever.

## 2024-01-28 NOTE — ED Provider Notes (Signed)
 [name] EMERGENCY DEPARTMENT AT [facility] Provider Note  CSN: [id number] Arrival date & time: 01/28/24 1006  Chief Complaint(s) Diarrhea  HPI [name] is a 89 y.o. female who is here today for diarrhea.  Patient was seen here on the 12th after she had been having diarrhea and had a syncopal episode.  Patient elected to go home.  She has not had any repeated syncopal episodes, and states she has been having increasing episodes of diarrhea as well as vomiting.   Past Medical History Past Medical History:  Diagnosis Date   Arthritis    "osteoarthritis"   Diarrhea 08/28/2011   "for the last 17 days; from samonella poisoning"   GERD (gastroesophageal reflux disease)    Headache(784.0) 08/28/2011   "just for the last few days; releated to dehydration"   Hearing aid worn    Hypertension    Raynaud's phenomenon    Shortness of breath 08/28/2011   "hard time breathing deeply because of the pain"   Patient Active Problem List   Diagnosis Date Noted   Presbycusis of both ears 12/02/2018   S/P reverse total shoulder arthroplasty, left 04/09/2018   Chronic left shoulder pain 03/20/2018   Sensorineural hearing loss (SNHL), bilateral 01/03/2017   Bilateral impacted cerumen 07/03/2016   Rhinitis, chronic 07/03/2016   H/O thrombocytosis 01/29/2012   Neck pain, acute 08/31/2011   Arthritis of hand 08/31/2011   Supraventricular tachycardia (HCC) 08/31/2011   Salmonella enteritis 08/28/2011   Muscle spasm of back 08/28/2011   Dehydration 08/28/2011   GERD (gastroesophageal reflux disease) 08/28/2011   DJD (degenerative joint disease) 08/28/2011   Rotator cuff tear, left 08/28/2011   ARF (acute renal failure) (HCC) 08/28/2011   Home Medication(s) Prior to Admission medications   Medication Sig Start Date End Date Taking? Authorizing Provider  Acetaminophen (TYLENOL EXTRA STRENGTH PO) Take by mouth.    [provider]  B Complex Vitamins (B-COMPLEX/B-12 PO)  Take by mouth daily.    [provider]  betamethasone dipropionate 0.05 % cream SMARTSIG:Sparingly Topical Twice Daily 10/20/21   [provider]  Biotin 10 MG TABS Take by mouth.    [provider]  Calcium Citrate (CITRACAL PO) Take by mouth.    [provider]  diclofenac sodium (VOLTAREN) 1 % GEL  10/24/16   [provider]  estradiol (VIVELLE-DOT) 0.0375 MG/24HR APPLY 1/2 PATCH TOPICALLY TO THE SKIN 2 TIMES A WEEK 12/24/23   Amundson C Silva, Blondie Burke, MD  HUMIRA, 2 PEN, 40 MG/0.4ML pen SMARTSIG:40 Milligram(s) SUB-Q Every 2 Weeks 12/06/23   [provider]  hydroxychloroquine (PLAQUENIL) 200 MG tablet Take 200 mg by mouth daily. 08/25/19   [provider]  ipratropium (ATROVENT) 0.06 % nasal spray SMARTSIG:2 Spray(s) Both Nares 3 Times Daily PRN    [provider]  metoprolol succinate (TOPROL-XL) 25 MG 24 hr tablet Takes 1/2 05/21/19   [provider]  Multiple Vitamin (MULTIVITAMIN ADULT) TABS     [provider]  ondansetron (ZOFRAN-ODT) 4 MG disintegrating tablet Take 1 tablet (4 mg total) by mouth every 8 (eight) hours as needed for nausea or vomiting. 01/26/24   Upstill, Clovis Dar, PA-C  OVER THE COUNTER MEDICATION Place 1 drop into both eyes 2 (two) times daily as needed (redness/ dry eyes). Over the counter eye drops    [provider]  oxyCODONE (OXY IR/ROXICODONE) 5 MG immediate release tablet Take 5 mg by mouth at bedtime as needed. 06/15/20   [provider]  pantoprazole (PROTONIX) 20 MG tablet Take 20 mg by mouth daily. 10/24/21   [provider]  RESTASIS 0.05 % ophthalmic emulsion INT 1 GTT INTO OU BID 11/12/18   [provider]  sodium polystyrene (SPS, SODIUM POLYSTYRENE SULF,) 15 GM/60ML suspension Take 60 mLs (1 bottle)  by mouth every 12 (twelve) hours for 2 days. 01/03/24     valACYclovir (VALTREX) 1000 MG tablet as needed.  01/06/17   [provider]   zolpidem (AMBIEN) 10 MG tablet Take 5-10 mg by mouth at bedtime as needed. 02/13/22   [provider]                                                                                                                                    Past Surgical History Past Surgical History:  Procedure Laterality Date   BACK SURGERY  10/2004   "spinal stenosis"   CATARACT EXTRACTION W/ INTRAOCULAR LENS  IMPLANT, BILATERAL  Summer 2011   FACIAL COSMETIC SURGERY  ~ 2002   FINGER SURGERY  ~ 2000   "titanium rod in right pointer; from arthritis"   FOOT SURGERY Right    KNEE ARTHROSCOPY Right    4/23   SHOULDER SURGERY Left 03/2018   TONSILLECTOMY  1943   TUBAL LIGATION  1970's   VAGINAL HYSTERECTOMY  11/2002   "still have my ovaries"   Family History Family History  Problem Relation Age of Onset   Cancer Father        colon    Social History Social History   Tobacco Use   Smoking status: Former    Current packs/day: 1.50    Average packs/day: 1.5 packs/day for 30.0 years (45.0 ttl pk-yrs)    Types: Cigarettes   Smokeless tobacco: Never   Tobacco comments:    quit 30 years go   Vaping Use   Vaping status: Never Used  Substance Use Topics   Alcohol use: Yes    Alcohol/week: 7.0 standard drinks of alcohol    Types: 7 Standard drinks or equivalent per week    Comment: Occ   Drug use: No   Allergies Sulfa antibiotics and Latex  Review of Systems Review of Systems  Physical Exam Vital Signs  I have reviewed the triage vital signs BP 124/73 (BP Location: Right Arm)   Pulse 82   Temp 97.8 F (36.6 C) (Oral)   Resp 20   SpO2 96%   Physical Exam Vitals reviewed.  HENT:     Head: Normocephalic.     Nose: Nose normal.  Eyes:     Pupils: Pupils are equal, round, and reactive to light.  Cardiovascular:     Rate and Rhythm: Normal rate.  Pulmonary:     Effort: Pulmonary effort is normal.  Abdominal:     General: Abdomen is flat. There is distension.     Palpations:  Abdomen is soft.     Tenderness:  There is no abdominal tenderness. There is no guarding.  Musculoskeletal:        General: Normal range of motion.     Cervical back: Normal range of motion.  Skin:    General: Skin is warm.  Neurological:     General: No focal deficit present.     Mental Status: She is alert.     ED Results and Treatments Labs (all labs ordered are listed, but only abnormal results are displayed) Labs Reviewed  CBC WITH DIFFERENTIAL/PLATELET - Abnormal; Notable for the following components:      Result Value   Abs Immature Granulocytes 0.10 (*)    All other components within normal limits  COMPREHENSIVE METABOLIC PANEL WITH GFR - Abnormal; Notable for the following components:   BUN 45 (*)    Creatinine, Ser 1.24 (*)    Total Protein 6.1 (*)    Albumin 3.3 (*)    GFR, Estimated 42 (*)    All other components within normal limits  LIPASE, BLOOD  URINALYSIS, ROUTINE W REFLEX MICROSCOPIC                                                                                                                          Radiology No results found.  Pertinent labs & imaging results that were available during my care of the patient were reviewed by me and considered in my medical decision making (see MDM for details).  Medications Ordered in ED Medications  ondansetron (ZOFRAN) injection 4 mg (4 mg Intravenous Given 01/28/24 1140)  lactated ringers bolus 1,000 mL (0 mLs Intravenous Stopped 01/28/24 1318)  metoCLOPramide (REGLAN) injection 10 mg (10 mg Intravenous Given 01/28/24 1414)                                                                                                                                     Procedures Procedures  (including critical care time)  Medical Decision Making / ED Course   This patient presents to the ED for concern of continued diarrhea, this involves an extensive number of treatment options, and is a complaint that carries with it a high  risk of complications and morbidity.  The differential diagnosis includes enteritis, gastroenteritis, ileus, less likely obstruction, less likely diverticulitis.  MDM: On exam, patient overall looks well.  She has normal vital signs.  She had CT imaging performed 2 days ago.  Will  recheck some blood work, provide the patient with IV fluids.  Reassessment 2:30 PM-patient's renal function has improved.  I discussed additional options with the patient which included admission for IV fluids, or continued management of symptoms at home.  Patient strongly preferred to return home and continue to manage her symptoms there.  Will send patient with a prescription for Reglan.  She understands that if she has worsening pain, fever, continued inability to eat or drink she should return to the emergency room.   Additional history obtained: -Additional history obtained from husband at bedside -External records from outside source obtained and reviewed including: Chart review including previous notes, labs, imaging, consultation notes   Lab Tests: -I ordered, reviewed, and interpreted labs.   The pertinent results include:   Labs Reviewed  CBC WITH DIFFERENTIAL/PLATELET - Abnormal; Notable for the following components:      Result Value   Abs Immature Granulocytes 0.10 (*)    All other components within normal limits  COMPREHENSIVE METABOLIC PANEL WITH GFR - Abnormal; Notable for the following components:   BUN 45 (*)    Creatinine, Ser 1.24 (*)    Total Protein 6.1 (*)    Albumin 3.3 (*)    GFR, Estimated 42 (*)    All other components within normal limits  LIPASE, BLOOD  URINALYSIS, ROUTINE W REFLEX MICROSCOPIC        Medicines ordered and prescription drug management: Meds ordered this encounter  Medications   ondansetron (ZOFRAN) injection 4 mg   lactated ringers bolus 1,000 mL   metoCLOPramide (REGLAN) injection 10 mg    -I have reviewed the patients home medicines and have made  adjustments as needed   Cardiac Monitoring: The patient was maintained on a cardiac monitor.  I personally viewed and interpreted the cardiac monitored which showed an underlying rhythm of: Normal sinus rhythm  Social Determinants of Health:  Factors impacting patients care include: Advanced age   Reevaluation: After the interventions noted above, I reevaluated the patient and found that they have :improved  Co morbidities that complicate the patient evaluation  Past Medical History:  Diagnosis Date   Arthritis    "osteoarthritis"   Diarrhea 08/28/2011   "for the last 17 days; from samonella poisoning"   GERD (gastroesophageal reflux disease)    Headache(784.0) 08/28/2011   "just for the last few days; releated to dehydration"   Hearing aid worn    Hypertension    Raynaud's phenomenon    Shortness of breath 08/28/2011   "hard time breathing deeply because of the pain"      Dispostion: I considered admission for this patient, however through shared decision making, the patient preferred to go home.     Final Clinical Impression(s) / ED Diagnoses Final diagnoses:  Diarrhea, unspecified type     @PCDICTATION @    Afton Horse T, DO 01/28/24 1434

## 2024-01-28 NOTE — Discharge Instructions (Signed)
 While you were in the emergency room, you had blood work done that showed an improvement in your kidney function.  Please continue to drink fluids, eat foods that are gentle on the stomach such as soups and broths.  You can take Reglan 2 times per day for nausea.  Please follow-up with your primary care doctor this week.  Return to the emergency room if you develop worsening pain in your abdomen, continued inability to eat or drink, or fever.

## 2024-01-29 LAB — GASTROINTESTINAL PANEL BY PCR, STOOL (REPLACES STOOL CULTURE)

## 2024-01-31 ENCOUNTER — Encounter (HOSPITAL_COMMUNITY): Payer: Self-pay

## 2024-01-31 ENCOUNTER — Ambulatory Visit (HOSPITAL_COMMUNITY)
Admission: RE | Admit: 2024-01-31 | Discharge: 2024-01-31 | Disposition: A | Discharge status: Home / Self Care | Source: Ambulatory Visit

## 2024-01-31 VITALS — BP 123/80 | HR 58 | Temp 97.3°F | Resp 20

## 2024-01-31 DIAGNOSIS — Z4802 Encounter for removal of sutures: Secondary | ICD-10-CM

## 2024-01-31 NOTE — ED Provider Notes (Signed)
 [facility]    CSN: [id number] Arrival date & time: 01/31/24  4098    HISTORY   Chief Complaint  Patient presents with   Suture / Staple Removal    Entered by patient   HPI [name] is a pleasant, 89 y.o. female who presents to urgent care today. Patient presents to urgent care today to have stitches placed at [facility] on January 26, 2024 to a facial laceration removed.  Patient reports no concerns or questions at this time.  The history is provided by the patient.  Suture / Staple Removal   Past Medical History:  Diagnosis Date   Arthritis    "osteoarthritis"   Diarrhea 08/28/2011   "for the last 17 days; from samonella poisoning"   GERD (gastroesophageal reflux disease)    Headache(784.0) 08/28/2011   "just for the last few days; releated to dehydration"   Hearing aid worn    Hypertension    Raynaud's phenomenon    Shortness of breath 08/28/2011   "hard time breathing deeply because of the pain"   Patient Active Problem List   Diagnosis Date Noted   Presbycusis of both ears 12/02/2018   S/P reverse total shoulder arthroplasty, left 04/09/2018   Chronic left shoulder pain 03/20/2018   Sensorineural hearing loss (SNHL), bilateral 01/03/2017   Bilateral impacted cerumen 07/03/2016   Rhinitis, chronic 07/03/2016   H/O thrombocytosis 01/29/2012   Neck pain, acute 08/31/2011   Arthritis of hand 08/31/2011   Supraventricular tachycardia (HCC) 08/31/2011   Salmonella enteritis 08/28/2011   Muscle spasm of back 08/28/2011   Dehydration 08/28/2011   GERD (gastroesophageal reflux disease) 08/28/2011   DJD (degenerative joint disease) 08/28/2011   Rotator cuff tear, left 08/28/2011   ARF (acute renal failure) (HCC) 08/28/2011   Past Surgical History:  Procedure Laterality Date   BACK SURGERY  10/2004   "spinal stenosis"   CATARACT EXTRACTION W/ INTRAOCULAR LENS  IMPLANT, BILATERAL  Summer 2011   FACIAL COSMETIC SURGERY  ~ 2002   FINGER SURGERY   ~ 2000   "titanium rod in right pointer; from arthritis"   FOOT SURGERY Right    KNEE ARTHROSCOPY Right    4/23   SHOULDER SURGERY Left 03/2018   TONSILLECTOMY  1943   TUBAL LIGATION  1970's   VAGINAL HYSTERECTOMY  11/2002   "still have my ovaries"   OB History     Gravida  2   Para  2   Term  2   Preterm      AB      Living  1      SAB      IAB      Ectopic      Multiple      Live Births  2          Home Medications    Prior to Admission medications   Medication Sig Start Date End Date Taking? Authorizing Provider  Acetaminophen (TYLENOL EXTRA STRENGTH PO) Take by mouth.    [provider]  B Complex Vitamins (B-COMPLEX/B-12 PO) Take by mouth daily.    [provider]  betamethasone dipropionate 0.05 % cream SMARTSIG:Sparingly Topical Twice Daily 10/20/21   [provider]  Biotin 10 MG TABS Take by mouth.    [provider]  Calcium Citrate (CITRACAL PO) Take by mouth.    [provider]  diclofenac sodium (VOLTAREN) 1 % GEL  10/24/16   [provider]  estradiol (VIVELLE-DOT) 0.0375 MG/24HR  APPLY 1/2 PATCH TOPICALLY TO THE SKIN 2 TIMES A WEEK 12/24/23   Amundson C Silva, Blondie Burke, MD  HUMIRA, 2 PEN, 40 MG/0.4ML pen SMARTSIG:40 Milligram(s) SUB-Q Every 2 Weeks 12/06/23   [provider]  hydroxychloroquine (PLAQUENIL) 200 MG tablet Take 200 mg by mouth daily. 08/25/19   [provider]  ipratropium (ATROVENT) 0.06 % nasal spray SMARTSIG:2 Spray(s) Both Nares 3 Times Daily PRN    [provider]  metoCLOPramide (REGLAN) 10 MG tablet Take 1 tablet (10 mg total) by mouth 2 (two) times daily. 01/28/24   Nathanael Baker, DO  metoprolol succinate (TOPROL-XL) 25 MG 24 hr tablet Takes 1/2 05/21/19   [provider]  Multiple Vitamin (MULTIVITAMIN ADULT) TABS     [provider]  ondansetron (ZOFRAN-ODT) 4 MG disintegrating tablet Take 1 tablet (4 mg total) by mouth every 8  (eight) hours as needed for nausea or vomiting. 01/26/24   Mandy Second, PA-C  OVER THE COUNTER MEDICATION Place 1 drop into both eyes 2 (two) times daily as needed (redness/ dry eyes). Over the counter eye drops    [provider]  oxyCODONE (OXY IR/ROXICODONE) 5 MG immediate release tablet Take 5 mg by mouth at bedtime as needed. 06/15/20   [provider]  pantoprazole (PROTONIX) 20 MG tablet Take 20 mg by mouth daily. 10/24/21   [provider]  RESTASIS 0.05 % ophthalmic emulsion INT 1 GTT INTO OU BID 11/12/18   [provider]  sodium polystyrene (SPS, SODIUM POLYSTYRENE SULF,) 15 GM/60ML suspension Take 60 mLs (1 bottle)  by mouth every 12 (twelve) hours for 2 days. 01/03/24     valACYclovir (VALTREX) 1000 MG tablet as needed.  01/06/17   [provider]  zolpidem (AMBIEN) 10 MG tablet Take 5-10 mg by mouth at bedtime as needed. 02/13/22   [provider]    Family History Family History  Problem Relation Age of Onset   Cancer Father        colon   Social History Social History   Tobacco Use   Smoking status: Former    Current packs/day: 1.50    Average packs/day: 1.5 packs/day for 30.0 years (45.0 ttl pk-yrs)    Types: Cigarettes   Smokeless tobacco: Never   Tobacco comments:    quit 30 years go   Vaping Use   Vaping status: Never Used  Substance Use Topics   Alcohol use: Yes    Alcohol/week: 7.0 standard drinks of alcohol    Types: 7 Standard drinks or equivalent per week    Comment: Occ   Drug use: No   Allergies   Sulfa antibiotics and Latex  Review of Systems Review of Systems Pertinent findings revealed after performing a 14 point review of systems has been noted in the history of present illness.  Physical Exam Vital Signs BP 123/80 (BP Location: Left Arm)   Pulse (!) 58   Temp (!) 97.3 F (36.3 C) (Oral)   Resp 20   SpO2 97%   No data found.  Physical Exam Vitals and nursing note reviewed.   Constitutional:      General: She is awake. She is not in acute distress.    Appearance: Normal appearance. She is not ill-appearing.  HENT:     Head: Normocephalic and atraumatic.  Musculoskeletal:        General: Normal range of motion.  Skin:    General: Skin is warm and dry.     Findings:  Lesion (Well-healed laceration above the medial aspect of right eyebrow without signs of dehiscence, drainage, surrounding induration or erythema.  9 simple interrupted 6.0 Prolene sutures present) present.  Neurological:     General: No focal deficit present.     Mental Status: She is alert and oriented to person, place, and time. Mental status is at baseline.     Cranial Nerves: Cranial nerves 2-12 are intact.     Coordination: Coordination is intact.  Psychiatric:        Attention and Perception: Attention and perception normal.        Mood and Affect: Mood and affect normal.        Speech: Speech normal.        Behavior: Behavior normal. Behavior is cooperative.        Thought Content: Thought content normal.        Cognition and Memory: Cognition and memory normal.        Judgment: Judgment normal.     Visual Acuity Right Eye Distance:   Left Eye Distance:   Bilateral Distance:    Right Eye Near:   Left Eye Near:    Bilateral Near:     UC Couse / Diagnostics / Procedures:     Radiology No results found.  Procedures Procedures (including critical care time) EKG  Pending results:  Labs Reviewed - No data to display  Medications Ordered in UC: Medications - No data to display  UC Diagnoses / Final Clinical Impressions(s)   I have reviewed the triage vital signs and the nursing notes.  Pertinent labs & imaging results that were available during my care of the patient were reviewed by me and considered in my medical decision making (see chart for details).    Final diagnoses:  Visit for suture removal   Patient presents for suture removal. The wound is well healed  without signs of infection.  The sutures are removed. Patient provided with instructions for aftercare after suture removal.  No other intervention recommended.  Return precautions advised. Return prn.   Please see discharge instructions below for details of plan of care as provided to patient. ED Prescriptions   None    PDMP not reviewed this encounter.  Pending results:  Labs Reviewed - No data to display    Discharge Instructions      Please see the enclosed information regarding how to care for your wound after suture removal.  Thank you for visiting [facility] today.      Disposition Upon Discharge:  Condition: stable for discharge home  Patient presented with an acute illness with associated systemic symptoms and significant discomfort requiring urgent management. In my opinion, this is a condition that a prudent lay person (someone who possesses an average knowledge of health and medicine) may potentially expect to result in complications if not addressed urgently such as respiratory distress, impairment of bodily function or dysfunction of bodily organs.   Routine symptom specific, illness specific and/or disease specific instructions were discussed with the patient and/or caregiver at length.   As such, the patient has been evaluated and assessed, work-up was performed and treatment was provided in alignment with urgent care protocols and evidence based medicine.  Patient/parent/caregiver has been advised that the patient may require follow up for further testing and treatment if the symptoms continue in spite of treatment, as clinically indicated and appropriate.  Patient/parent/caregiver has been advised to return to the [facility] or PCP if no better; to PCP  or the Emergency Department if new signs and symptoms develop, or if the current signs or symptoms continue to change or worsen for further workup, evaluation and treatment as clinically indicated and  appropriate  The patient will follow up with their current PCP if and as advised. If the patient does not currently have a PCP we will assist them in obtaining one.   The patient may need specialty follow up if the symptoms continue, in spite of conservative treatment and management, for further workup, evaluation, consultation and treatment as clinically indicated and appropriate.  Patient/parent/caregiver verbalized understanding and agreement of plan as discussed.  All questions were addressed during visit.  Please see discharge instructions below for further details of plan.  This office note has been dictated using Teaching laboratory technician.  Unfortunately, this method of dictation can sometimes lead to typographical or grammatical errors.  I apologize for your inconvenience in advance if this occurs.  Please do not hesitate to reach out to me if clarification is needed.      Eloise Hake Scales, PA-C 01/31/24 1059

## 2024-01-31 NOTE — Discharge Instructions (Addendum)
 Please see the enclosed information regarding how to care for your wound after suture removal.  Thank you for visiting [facility] today.

## 2024-01-31 NOTE — ED Notes (Signed)
 Removed 9 sutures to rt eyebrow.

## 2024-01-31 NOTE — ED Triage Notes (Signed)
 Pt here for suture removal. States placed last Saturday at [facility]. Denies any redness or drainage.

## 2024-02-04 ENCOUNTER — Encounter (HOSPITAL_BASED_OUTPATIENT_CLINIC_OR_DEPARTMENT_OTHER): Attending: Internal Medicine | Admitting: Internal Medicine

## 2024-02-04 DIAGNOSIS — I87332 Chronic venous hypertension (idiopathic) with ulcer and inflammation of left lower extremity: Secondary | ICD-10-CM | POA: Insufficient documentation

## 2024-02-04 DIAGNOSIS — L89893 Pressure ulcer of other site, stage 3: Secondary | ICD-10-CM | POA: Diagnosis not present

## 2024-02-04 DIAGNOSIS — S91301A Unspecified open wound, right foot, initial encounter: Secondary | ICD-10-CM | POA: Diagnosis not present

## 2024-02-04 DIAGNOSIS — S91301D Unspecified open wound, right foot, subsequent encounter: Secondary | ICD-10-CM | POA: Diagnosis not present

## 2024-02-04 DIAGNOSIS — I87311 Chronic venous hypertension (idiopathic) with ulcer of right lower extremity: Secondary | ICD-10-CM

## 2024-02-04 DIAGNOSIS — L97822 Non-pressure chronic ulcer of other part of left lower leg with fat layer exposed: Secondary | ICD-10-CM | POA: Insufficient documentation

## 2024-02-04 DIAGNOSIS — X58XXXA Exposure to other specified factors, initial encounter: Secondary | ICD-10-CM | POA: Insufficient documentation

## 2024-02-05 DIAGNOSIS — Z79899 Other long term (current) drug therapy: Secondary | ICD-10-CM | POA: Diagnosis not present

## 2024-02-05 DIAGNOSIS — M069 Rheumatoid arthritis, unspecified: Secondary | ICD-10-CM | POA: Diagnosis not present

## 2024-02-05 DIAGNOSIS — G894 Chronic pain syndrome: Secondary | ICD-10-CM | POA: Diagnosis not present

## 2024-02-25 DIAGNOSIS — L905 Scar conditions and fibrosis of skin: Secondary | ICD-10-CM | POA: Diagnosis not present

## 2024-02-25 DIAGNOSIS — Z85828 Personal history of other malignant neoplasm of skin: Secondary | ICD-10-CM | POA: Diagnosis not present

## 2024-03-03 DIAGNOSIS — I87312 Chronic venous hypertension (idiopathic) with ulcer of left lower extremity: Secondary | ICD-10-CM | POA: Diagnosis not present

## 2024-03-04 ENCOUNTER — Ambulatory Visit (HOSPITAL_BASED_OUTPATIENT_CLINIC_OR_DEPARTMENT_OTHER): Admitting: Internal Medicine

## 2024-03-07 ENCOUNTER — Ambulatory Visit (HOSPITAL_BASED_OUTPATIENT_CLINIC_OR_DEPARTMENT_OTHER): Admitting: Internal Medicine

## 2024-03-12 DIAGNOSIS — L409 Psoriasis, unspecified: Secondary | ICD-10-CM | POA: Diagnosis not present

## 2024-03-12 DIAGNOSIS — M459 Ankylosing spondylitis of unspecified sites in spine: Secondary | ICD-10-CM | POA: Diagnosis not present

## 2024-03-12 DIAGNOSIS — M154 Erosive (osteo)arthritis: Secondary | ICD-10-CM | POA: Diagnosis not present

## 2024-03-12 DIAGNOSIS — Z1589 Genetic susceptibility to other disease: Secondary | ICD-10-CM | POA: Diagnosis not present

## 2024-03-12 DIAGNOSIS — Z79899 Other long term (current) drug therapy: Secondary | ICD-10-CM | POA: Diagnosis not present

## 2024-03-12 DIAGNOSIS — Z681 Body mass index (BMI) 19 or less, adult: Secondary | ICD-10-CM | POA: Diagnosis not present

## 2024-03-18 DIAGNOSIS — H903 Sensorineural hearing loss, bilateral: Secondary | ICD-10-CM | POA: Diagnosis not present

## 2024-03-20 DIAGNOSIS — R1310 Dysphagia, unspecified: Secondary | ICD-10-CM | POA: Diagnosis not present

## 2024-03-20 DIAGNOSIS — K219 Gastro-esophageal reflux disease without esophagitis: Secondary | ICD-10-CM | POA: Diagnosis not present

## 2024-03-25 ENCOUNTER — Encounter (HOSPITAL_BASED_OUTPATIENT_CLINIC_OR_DEPARTMENT_OTHER): Attending: Internal Medicine | Admitting: Internal Medicine

## 2024-03-25 DIAGNOSIS — X58XXXA Exposure to other specified factors, initial encounter: Secondary | ICD-10-CM | POA: Insufficient documentation

## 2024-03-25 DIAGNOSIS — L89893 Pressure ulcer of other site, stage 3: Secondary | ICD-10-CM | POA: Diagnosis not present

## 2024-03-25 DIAGNOSIS — S81801A Unspecified open wound, right lower leg, initial encounter: Secondary | ICD-10-CM | POA: Diagnosis not present

## 2024-03-25 DIAGNOSIS — I10 Essential (primary) hypertension: Secondary | ICD-10-CM | POA: Diagnosis not present

## 2024-03-25 DIAGNOSIS — S91301D Unspecified open wound, right foot, subsequent encounter: Secondary | ICD-10-CM | POA: Diagnosis not present

## 2024-03-25 DIAGNOSIS — I87311 Chronic venous hypertension (idiopathic) with ulcer of right lower extremity: Secondary | ICD-10-CM | POA: Diagnosis not present

## 2024-04-02 DIAGNOSIS — Z23 Encounter for immunization: Secondary | ICD-10-CM | POA: Diagnosis not present

## 2024-04-02 DIAGNOSIS — E559 Vitamin D deficiency, unspecified: Secondary | ICD-10-CM | POA: Diagnosis not present

## 2024-04-02 DIAGNOSIS — G479 Sleep disorder, unspecified: Secondary | ICD-10-CM | POA: Diagnosis not present

## 2024-04-02 DIAGNOSIS — M79672 Pain in left foot: Secondary | ICD-10-CM | POA: Diagnosis not present

## 2024-04-02 DIAGNOSIS — M0600 Rheumatoid arthritis without rheumatoid factor, unspecified site: Secondary | ICD-10-CM | POA: Diagnosis not present

## 2024-04-02 DIAGNOSIS — Z1331 Encounter for screening for depression: Secondary | ICD-10-CM | POA: Diagnosis not present

## 2024-04-02 DIAGNOSIS — E875 Hyperkalemia: Secondary | ICD-10-CM | POA: Diagnosis not present

## 2024-04-02 DIAGNOSIS — M79671 Pain in right foot: Secondary | ICD-10-CM | POA: Diagnosis not present

## 2024-04-02 DIAGNOSIS — N1831 Chronic kidney disease, stage 3a: Secondary | ICD-10-CM | POA: Diagnosis not present

## 2024-04-02 DIAGNOSIS — E871 Hypo-osmolality and hyponatremia: Secondary | ICD-10-CM | POA: Diagnosis not present

## 2024-04-02 DIAGNOSIS — Z0000 Encounter for general adult medical examination without abnormal findings: Secondary | ICD-10-CM | POA: Diagnosis not present

## 2024-04-02 DIAGNOSIS — K219 Gastro-esophageal reflux disease without esophagitis: Secondary | ICD-10-CM | POA: Diagnosis not present

## 2024-04-02 DIAGNOSIS — I10 Essential (primary) hypertension: Secondary | ICD-10-CM | POA: Diagnosis not present

## 2024-04-16 DIAGNOSIS — B3781 Candidal esophagitis: Secondary | ICD-10-CM | POA: Diagnosis not present

## 2024-04-16 DIAGNOSIS — R1310 Dysphagia, unspecified: Secondary | ICD-10-CM | POA: Diagnosis not present

## 2024-04-16 DIAGNOSIS — K449 Diaphragmatic hernia without obstruction or gangrene: Secondary | ICD-10-CM | POA: Diagnosis not present

## 2024-04-16 DIAGNOSIS — K2289 Other specified disease of esophagus: Secondary | ICD-10-CM | POA: Diagnosis not present

## 2024-04-21 ENCOUNTER — Other Ambulatory Visit: Payer: Self-pay

## 2024-04-21 ENCOUNTER — Encounter (HOSPITAL_BASED_OUTPATIENT_CLINIC_OR_DEPARTMENT_OTHER): Payer: Self-pay | Admitting: Emergency Medicine

## 2024-04-21 ENCOUNTER — Emergency Department (HOSPITAL_BASED_OUTPATIENT_CLINIC_OR_DEPARTMENT_OTHER)

## 2024-04-21 ENCOUNTER — Emergency Department (HOSPITAL_BASED_OUTPATIENT_CLINIC_OR_DEPARTMENT_OTHER)
Admission: EM | Admit: 2024-04-21 | Discharge: 2024-04-21 | Disposition: A | Discharge status: Home / Self Care | Attending: Emergency Medicine | Admitting: Emergency Medicine

## 2024-04-21 DIAGNOSIS — R220 Localized swelling, mass and lump, head: Secondary | ICD-10-CM | POA: Diagnosis not present

## 2024-04-21 DIAGNOSIS — W01198A Fall on same level from slipping, tripping and stumbling with subsequent striking against other object, initial encounter: Secondary | ICD-10-CM | POA: Insufficient documentation

## 2024-04-21 DIAGNOSIS — S0990XA Unspecified injury of head, initial encounter: Secondary | ICD-10-CM | POA: Diagnosis present

## 2024-04-21 DIAGNOSIS — M47811 Spondylosis without myelopathy or radiculopathy, occipito-atlanto-axial region: Secondary | ICD-10-CM | POA: Diagnosis not present

## 2024-04-21 DIAGNOSIS — W19XXXA Unspecified fall, initial encounter: Secondary | ICD-10-CM

## 2024-04-21 DIAGNOSIS — R001 Bradycardia, unspecified: Secondary | ICD-10-CM | POA: Insufficient documentation

## 2024-04-21 DIAGNOSIS — M5020 Other cervical disc displacement, unspecified cervical region: Secondary | ICD-10-CM | POA: Diagnosis not present

## 2024-04-21 DIAGNOSIS — Z79899 Other long term (current) drug therapy: Secondary | ICD-10-CM | POA: Diagnosis not present

## 2024-04-21 DIAGNOSIS — S0083XA Contusion of other part of head, initial encounter: Secondary | ICD-10-CM | POA: Insufficient documentation

## 2024-04-21 DIAGNOSIS — I6782 Cerebral ischemia: Secondary | ICD-10-CM | POA: Diagnosis not present

## 2024-04-21 DIAGNOSIS — Z91040 Latex allergy status: Secondary | ICD-10-CM | POA: Diagnosis not present

## 2024-04-21 LAB — COMPREHENSIVE METABOLIC PANEL WITH GFR
ALT: 21 U/L (ref 0–44)
AST: 28 U/L (ref 15–41)
Albumin: 3.8 g/dL (ref 3.5–5.0)
Alkaline Phosphatase: 96 U/L (ref 38–126)
Anion gap: 8 (ref 5–15)
BUN: 19 mg/dL (ref 8–23)
CO2: 25 mmol/L (ref 22–32)
Calcium: 9.1 mg/dL (ref 8.9–10.3)
Chloride: 102 mmol/L (ref 98–111)
Creatinine, Ser: 0.84 mg/dL (ref 0.44–1.00)
GFR, Estimated: >60 mL/min (ref >60)
Glucose, Bld: 88 mg/dL (ref 70–99)
Potassium: 4.8 mmol/L (ref 3.5–5.1)
Sodium: 135 mmol/L (ref 135–145)
Total Bilirubin: 0.6 mg/dL (ref 0.0–1.2)
Total Protein: 6.4 g/dL — ABNORMAL LOW (ref 6.5–8.1)

## 2024-04-21 LAB — PROTIME-INR
INR: 1 (ref 0.8–1.2)
Prothrombin Time: 13.3 s (ref 11.4–15.2)

## 2024-04-21 LAB — CBC WITH DIFFERENTIAL/PLATELET
Abs Immature Granulocytes: 0.02 K/uL (ref 0.00–0.07)
Basophils Absolute: 0.1 K/uL (ref 0.0–0.1)
Basophils Relative: 1 %
Eosinophils Absolute: 0.1 K/uL (ref 0.0–0.5)
Eosinophils Relative: 2 %
HCT: 38.5 % (ref 36.0–46.0)
Hemoglobin: 13.1 g/dL (ref 12.0–15.0)
Immature Granulocytes: 0 %
Lymphocytes Relative: 21 %
Lymphs Abs: 1.6 K/uL (ref 0.7–4.0)
MCH: 32.6 pg (ref 26.0–34.0)
MCHC: 34 g/dL (ref 30.0–36.0)
MCV: 95.8 fL (ref 80.0–100.0)
Monocytes Absolute: 0.5 K/uL (ref 0.1–1.0)
Monocytes Relative: 7 %
Neutro Abs: 5.5 K/uL (ref 1.7–7.7)
Neutrophils Relative %: 69 %
Platelets: 294 K/uL (ref 150–400)
RBC: 4.02 MIL/uL (ref 3.87–5.11)
RDW: 13.5 % (ref 11.5–15.5)
WBC: 7.9 K/uL (ref 4.0–10.5)
nRBC: 0 % (ref 0.0–0.2)

## 2024-04-21 LAB — URINALYSIS, ROUTINE W REFLEX MICROSCOPIC
Bilirubin Urine: NEGATIVE
Glucose, UA: NEGATIVE mg/dL
Hgb urine dipstick: NEGATIVE
Ketones, ur: NEGATIVE mg/dL
Leukocytes,Ua: NEGATIVE
Nitrite: NEGATIVE
Protein, ur: NEGATIVE mg/dL
Specific Gravity, Urine: 1.009 (ref 1.005–1.030)
pH: 6.5 (ref 5.0–8.0)

## 2024-04-21 LAB — TROPONIN T, HIGH SENSITIVITY
Troponin T High Sensitivity: <15 ng/L (ref <19)
Troponin T High Sensitivity: <15 ng/L (ref <19)

## 2024-04-21 LAB — CBG MONITORING, ED: Glucose-Capillary: 83 mg/dL (ref 70–99)

## 2024-04-21 NOTE — ED Notes (Signed)
 Patient roomed after CT. No need to activate Stroke per provider.

## 2024-04-21 NOTE — ED Notes (Signed)
 Pt dizzy and off-balance upon standing with tech to go to restroom to provide urine sample. EDP notified

## 2024-04-21 NOTE — ED Notes (Signed)
 Pt d/c instructions, medications, and follow-up care reviewed with pt and pt's husband. Pt and pt's husband verbalized understanding and had no further questions at time of d/c. Pt CA&Ox4 and in NAD at time of d/c

## 2024-04-21 NOTE — ED Provider Notes (Signed)
  EMERGENCY DEPARTMENT AT [facility] Provider Note   CSN: [id number] Arrival date & time: 04/21/24  1013     Patient presents with: [name] is a 89 y.o. female.   Patient reports that she fell striking her head.  He reports he was in another room and heard patient fall.  He reports he saw her get up and she fell again.  The second time that she fell she struck the front of her head.  She did not lose consciousness.  Patient complains of feeling unbalanced.  Patient states she has had some issues with balance in the past.  Patient's husband reports that patient had an episode of slurred speech after the fall.  He reports that this has resolved.  He states that patient was normal at 530 a.m. he states that the fall occurred around 6:30 AM.  Patient's speech is back to normal.  Patient reports difficulty with hearing however this is her baseline she states that she has hearing aids.  Patient denies any visual changes she denies any weakness.  Patient reports she has been able to ambulate since the fall.  Patient denies any chest pain she has not had any abdominal pain.  Patient denies any recent illness.  She is not currently having any pain  The history is provided by the patient and the spouse. No language interpreter was used.  Fall Pertinent negatives include no chest pain and no abdominal pain.       Prior to Admission medications   Medication Sig Start Date End Date Taking? Authorizing Provider  famotidine (PEPCID) 40 MG tablet Take 40 mg by mouth 2 (two) times daily as needed. 04/02/24  Yes [provider]  nystatin (MYCOSTATIN) 100000 UNIT/ML suspension Take 4 mLs by mouth 4 (four) times daily. 04/16/24  Yes [provider]  Acetaminophen  (TYLENOL  EXTRA STRENGTH PO) Take by mouth.    [provider]  B Complex Vitamins (B-COMPLEX/B-12 PO) Take by mouth daily.    [provider]  betamethasone  dipropionate 0.05 % cream  SMARTSIG:Sparingly Topical Twice Daily 10/20/21   [provider]  Biotin 10 MG TABS Take by mouth.    [provider]  Calcium  Citrate (CITRACAL PO) Take by mouth.    [provider]  diclofenac sodium (VOLTAREN) 1 % GEL  10/24/16   [provider]  estradiol  (VIVELLE -DOT) 0.0375 MG/24HR APPLY 1/2 PATCH TOPICALLY TO THE SKIN 2 TIMES A WEEK 12/24/23   Amundson C Silva, Bobie BRAVO, MD  HUMIRA, 2 PEN, 40 MG/0.4ML pen SMARTSIG:40 Milligram(s) SUB-Q Every 2 Weeks 12/06/23   [provider]  hydroxychloroquine (PLAQUENIL) 200 MG tablet Take 200 mg by mouth daily. 08/25/19   [provider]  ipratropium (ATROVENT) 0.06 % nasal spray SMARTSIG:2 Spray(s) Both Nares 3 Times Daily PRN    [provider]  metoCLOPramide  (REGLAN ) 10 MG tablet Take 1 tablet (10 mg total) by mouth 2 (two) times daily. 01/28/24   Mannie Fairy DASEN, DO  metoprolol succinate (TOPROL-XL) 25 MG 24 hr tablet Takes 1/2 05/21/19   [provider]  Multiple Vitamin (MULTIVITAMIN ADULT) TABS     [provider]  ondansetron  (ZOFRAN -ODT) 4 MG disintegrating tablet Take 1 tablet (4 mg total) by mouth every 8 (eight) hours as needed for nausea or vomiting. 01/26/24   Odell Balls, PA-C  OVER THE COUNTER MEDICATION Place 1 drop into both eyes 2 (two) times daily as needed (redness/ dry eyes). Over the counter  eye drops    [provider]  oxyCODONE (OXY IR/ROXICODONE) 5 MG immediate release tablet Take 5 mg by mouth at bedtime as needed. 06/15/20   [provider]  pantoprazole  (PROTONIX ) 20 MG tablet Take 20 mg by mouth daily. 10/24/21   [provider]  RESTASIS 0.05 % ophthalmic emulsion INT 1 GTT INTO OU BID 11/12/18   [provider]  sodium polystyrene (SPS, SODIUM POLYSTYRENE SULF,) 15 GM/60ML suspension Take 60 mLs (1 bottle)  by mouth every 12 (twelve) hours for 2 days. 01/03/24     valACYclovir (VALTREX) 1000 MG tablet as needed.   01/06/17   [provider]  zolpidem  (AMBIEN ) 10 MG tablet Take 5-10 mg by mouth at bedtime as needed. 02/13/22   [provider]    Allergies: Sulfa  antibiotics and Latex    Review of Systems  HENT:  Negative for hearing loss.   Eyes:  Negative for visual disturbance.  Cardiovascular:  Negative for chest pain.  Gastrointestinal:  Negative for abdominal pain.  Genitourinary:  Negative for dysuria.  Musculoskeletal:  Negative for back pain and neck pain.  Skin:  Negative for color change.  All other systems reviewed and are negative.   Updated Vital Signs BP (!) 158/56   Pulse (!) 45   Temp 97.8 F (36.6 C) (Oral)   Resp 12   SpO2 100%   Physical Exam Vitals and nursing note reviewed.  Constitutional:      Appearance: She is well-developed.  HENT:     Head: Normocephalic.     Right Ear: External ear normal.     Left Ear: External ear normal.     Nose: Nose normal.     Mouth/Throat:     Mouth: Mucous membranes are moist.  Eyes:     Extraocular Movements: Extraocular movements intact.     Pupils: Pupils are equal, round, and reactive to light.  Cardiovascular:     Rate and Rhythm: Bradycardia present.     Pulses: Normal pulses.  Pulmonary:     Effort: Pulmonary effort is normal.  Abdominal:     General: Abdomen is flat. There is no distension.     Palpations: Abdomen is soft.  Musculoskeletal:        General: Normal range of motion.     Cervical back: Normal range of motion.  Neurological:     General: No focal deficit present.     Mental Status: She is alert and oriented to person, place, and time.     Cranial Nerves: No cranial nerve deficit.     Sensory: No sensory deficit.     Motor: No weakness.     Coordination: Coordination normal.     Gait: Gait normal.  Psychiatric:        Mood and Affect: Mood normal.     (all labs ordered are listed, but only abnormal results are displayed) Labs Reviewed  COMPREHENSIVE METABOLIC PANEL WITH GFR -  Abnormal; Notable for the following components:      Result Value   Total Protein 6.4 (*)    All other components within normal limits  URINALYSIS, ROUTINE W REFLEX MICROSCOPIC - Abnormal; Notable for the following components:   Color, Urine COLORLESS (*)    All other components within normal limits  CBC WITH DIFFERENTIAL/PLATELET  PROTIME-INR  CBG MONITORING, ED  TROPONIN T, HIGH SENSITIVITY  TROPONIN T, HIGH SENSITIVITY    EKG: None  Radiology: CT Head Wo Contrast Result Date: 04/21/2024 CLINICAL DATA:  Provided history: Fall. EXAM: CT HEAD WITHOUT CONTRAST CT CERVICAL SPINE WITHOUT CONTRAST TECHNIQUE: Multidetector CT imaging of the head and cervical spine was performed following the standard protocol without intravenous contrast. Multiplanar CT image reconstructions of the cervical spine were also generated. RADIATION DOSE REDUCTION: This exam was performed according to the departmental dose-optimization program which includes automated exposure control, adjustment of the mA and/or kV according to patient size and/or use of iterative reconstruction technique. COMPARISON:  Head CT 01/26/2024.  Cervical spine CT 01/26/2024. FINDINGS: CT HEAD FINDINGS Brain: Generalized cerebral atrophy. Patchy and ill-defined hypoattenuation within the cerebral white matter, nonspecific but compatible with moderate chronic small vessel ischemic disease. There is no acute intracranial hemorrhage. No demarcated cortical infarct. No extra-axial fluid collection. No evidence of an intracranial mass. No midline shift. Vascular: No hyperdense vessel. Atherosclerotic calcifications. Skull: No calvarial fracture or aggressive osseous lesion. Sinuses/Orbits: No mass or acute finding within the imaged orbits. No significant paranasal sinus disease at the imaged levels. Other: Left forehead soft tissue swelling. Trace fluid within bilateral mastoid air cells. CT CERVICAL SPINE FINDINGS Alignment: Nonspecific reversal of the  expected cervical lordosis. Levocurvature of the cervical spine. 3 mm grade 1 anterolisthesis at C2-C3 and C7-T1. 2 mm T2-T3 grade 1 anterolisthesis. Skull base and vertebrae: The basion-dental and atlanto-dental intervals are maintained.No evidence of acute fracture to the cervical spine. C5-C6 vertebral ankylosis. Bilateral facet ankylosis at T2-T3 and T3-T4 Soft tissues and spinal canal: No prevertebral fluid or swelling. No visible canal hematoma. Subcentimeter nodule within the left thyroid  lobe not meeting consensus criteria for ultrasound follow-up based on size. No follow-up imaging recommended. Reference: J Am Coll Radiol. 2015 Feb;12(2): 143-50. Disc levels: Cervical spondylosis with multilevel disc space narrowing, disc bulges/central disc protrusions, posterior disc osteophyte complexes, uncovertebral hypertrophy and facet arthropathy. At the non-fused levels, disc space narrowing is greatest at C3-C4, C4-C5, C6-C7 and T1-T2 (advanced at these levels). Multilevel spinal canal stenosis. Most notably at C4-C5 and C5-C6, posterior disc osteophyte complexes contribute to suspected moderate spinal canal stenosis. Multilevel bony neural foraminal narrowing. Multilevel ventral osteophytes. Degenerative changes also present at the C1-C2 articulation. Upper chest: No consolidation within the imaged lung apices. No visible pneumothorax. Emphysema. IMPRESSION: CT head: 1. No evidence of an acute intracranial abnormality. 2. Parenchymal atrophy and chronic small vessel ischemic disease. 3. Left forehead soft tissue swelling. 4. Trace fluid within bilateral mastoid air cells. CT cervical spine: 1. No evidence of acute cervical spine fracture. 2. Grade 1 anterolisthesis at C2-C3, C7-T1 and T2-T3, similar to the prior cervical spine CT of 01/26/2024. 3. Nonspecific reversal of the expected cervical lordosis. 4. Levocurvature of the cervical spine. 5. Cervical spondylosis as described. 6. C5-C6 vertebral ankylosis. 7.  Bilateral facet ankylosis at T2-T3 and T3-T4. 8. Aortic Atherosclerosis (ICD10-I70.0). Electronically Signed   By: Rockey Childs D.O.   On: 04/21/2024 10:54   CT Cervical Spine Wo Contrast Result Date: 04/21/2024 CLINICAL DATA:  Provided history: Fall. EXAM: CT HEAD WITHOUT CONTRAST CT CERVICAL SPINE WITHOUT CONTRAST TECHNIQUE: Multidetector CT imaging of the head and cervical spine was performed following the standard protocol without intravenous contrast. Multiplanar CT image reconstructions of the cervical spine were also generated. RADIATION DOSE REDUCTION: This exam was performed according to the departmental dose-optimization program which includes automated exposure control, adjustment of the mA and/or kV according to patient size and/or use of iterative reconstruction technique. COMPARISON:  Head CT 01/26/2024.  Cervical spine CT 01/26/2024. FINDINGS: CT HEAD FINDINGS Brain: Generalized cerebral atrophy.  Patchy and ill-defined hypoattenuation within the cerebral white matter, nonspecific but compatible with moderate chronic small vessel ischemic disease. There is no acute intracranial hemorrhage. No demarcated cortical infarct. No extra-axial fluid collection. No evidence of an intracranial mass. No midline shift. Vascular: No hyperdense vessel. Atherosclerotic calcifications. Skull: No calvarial fracture or aggressive osseous lesion. Sinuses/Orbits: No mass or acute finding within the imaged orbits. No significant paranasal sinus disease at the imaged levels. Other: Left forehead soft tissue swelling. Trace fluid within bilateral mastoid air cells. CT CERVICAL SPINE FINDINGS Alignment: Nonspecific reversal of the expected cervical lordosis. Levocurvature of the cervical spine. 3 mm grade 1 anterolisthesis at C2-C3 and C7-T1. 2 mm T2-T3 grade 1 anterolisthesis. Skull base and vertebrae: The basion-dental and atlanto-dental intervals are maintained.No evidence of acute fracture to the cervical spine. C5-C6  vertebral ankylosis. Bilateral facet ankylosis at T2-T3 and T3-T4 Soft tissues and spinal canal: No prevertebral fluid or swelling. No visible canal hematoma. Subcentimeter nodule within the left thyroid  lobe not meeting consensus criteria for ultrasound follow-up based on size. No follow-up imaging recommended. Reference: J Am Coll Radiol. 2015 Feb;12(2): 143-50. Disc levels: Cervical spondylosis with multilevel disc space narrowing, disc bulges/central disc protrusions, posterior disc osteophyte complexes, uncovertebral hypertrophy and facet arthropathy. At the non-fused levels, disc space narrowing is greatest at C3-C4, C4-C5, C6-C7 and T1-T2 (advanced at these levels). Multilevel spinal canal stenosis. Most notably at C4-C5 and C5-C6, posterior disc osteophyte complexes contribute to suspected moderate spinal canal stenosis. Multilevel bony neural foraminal narrowing. Multilevel ventral osteophytes. Degenerative changes also present at the C1-C2 articulation. Upper chest: No consolidation within the imaged lung apices. No visible pneumothorax. Emphysema. IMPRESSION: CT head: 1. No evidence of an acute intracranial abnormality. 2. Parenchymal atrophy and chronic small vessel ischemic disease. 3. Left forehead soft tissue swelling. 4. Trace fluid within bilateral mastoid air cells. CT cervical spine: 1. No evidence of acute cervical spine fracture. 2. Grade 1 anterolisthesis at C2-C3, C7-T1 and T2-T3, similar to the prior cervical spine CT of 01/26/2024. 3. Nonspecific reversal of the expected cervical lordosis. 4. Levocurvature of the cervical spine. 5. Cervical spondylosis as described. 6. C5-C6 vertebral ankylosis. 7. Bilateral facet ankylosis at T2-T3 and T3-T4. 8. Aortic Atherosclerosis (ICD10-I70.0). Electronically Signed   By: Rockey Childs D.O.   On: 04/21/2024 10:54     Procedures   Medications Ordered in the ED - No data to display                                  Medical Decision  Making Patient fell this a.m.  She was able to stand and fell a second time striking her head.  Patient did not lose consciousness.  Amount and/or Complexity of Data Reviewed Independent Historian: spouse    Details: Patient is here with her husband he reports that after patient struck her head she had some slurred speech.  He reports this has resolved. External Data Reviewed: notes.    Details: Primary care notes reviewed Labs: ordered. Decision-making details documented in ED Course.    Details: Labs ordered reviewed and interpreted troponin is negative x 2.  CBC and chemistry are normal UA is negative Radiology: ordered and independent interpretation performed. Decision-making details documented in ED Course.    Details: CT head and CT cervical spine ordered reviewed and interpreted.  No evidence of acute intracranial process.  CT cervical spine shows no acute fracture. ECG/medicine tests: ordered  and independent interpretation performed. Decision-making details documented in ED Course.    Details: EKG sinus bradycardia  Risk Risk Details: Patient is on metoprolol 12.5 mg daily.  Patient has had her dosage today.  Patient observed an extended period of time.  She is able to ambulate.  I discussed results with patient and her husband.  Patient wants to go home.  Patient's husband wants her to go home.  Patient offered option of hospitalization and observation.  She declines.  She feels like she is normal and has been feels good about taking her home.  I discussed bradycardia.  I advised stopping the metoprolol until further evaluation by her primary care physician.  Patient is discharged in stable condition.        Final diagnoses:  Contusion of forehead, initial encounter  Fall, initial encounter    ED Discharge Orders     None      An After Visit Summary was printed and given to the patient.     Flint Sonny POUR, PA-C 04/21/24 1759    Dreama Longs, MD 04/21/24  [phone]

## 2024-04-21 NOTE — Discharge Instructions (Addendum)
 Your heart rate was low today.  Stop taking metoprolol until rechecked  by your Physician.  Return if any problems.

## 2024-04-21 NOTE — ED Notes (Signed)
 RN brought patient to CT.

## 2024-04-21 NOTE — ED Triage Notes (Signed)
 Husband states patient has had trouble walking and multiple falls this morning. Reports intermittent slurred speech.  Woke up around 0530 this morning. Husband noticed symptoms around 0630.  Large knot on forehead.  No noticeable deficits.   Provider in triage.

## 2024-04-21 NOTE — ED Notes (Signed)
 Pt stood to go to the bathroom, but was unsteady on her feet. Pt walked with her husband and I to the bathroom. Pt did appear dizzy going to and from bathroom. Pt's BP was checked when we returned, BP 180/64 after ambulating.

## 2024-04-22 ENCOUNTER — Other Ambulatory Visit: Payer: Self-pay

## 2024-04-22 ENCOUNTER — Encounter (HOSPITAL_COMMUNITY): Payer: Self-pay | Admitting: *Deleted

## 2024-04-22 ENCOUNTER — Encounter (HOSPITAL_BASED_OUTPATIENT_CLINIC_OR_DEPARTMENT_OTHER): Attending: Internal Medicine | Admitting: Internal Medicine

## 2024-04-22 ENCOUNTER — Emergency Department (HOSPITAL_COMMUNITY)
Admission: EM | Admit: 2024-04-22 | Discharge: 2024-04-22 | Disposition: A | Discharge status: Home / Self Care | Source: Other Acute Inpatient Hospital | Attending: Emergency Medicine | Admitting: Emergency Medicine

## 2024-04-22 ENCOUNTER — Emergency Department (HOSPITAL_COMMUNITY)

## 2024-04-22 DIAGNOSIS — Z09 Encounter for follow-up examination after completed treatment for conditions other than malignant neoplasm: Secondary | ICD-10-CM | POA: Diagnosis not present

## 2024-04-22 DIAGNOSIS — S91301D Unspecified open wound, right foot, subsequent encounter: Secondary | ICD-10-CM

## 2024-04-22 DIAGNOSIS — S91301A Unspecified open wound, right foot, initial encounter: Secondary | ICD-10-CM | POA: Insufficient documentation

## 2024-04-22 DIAGNOSIS — I87311 Chronic venous hypertension (idiopathic) with ulcer of right lower extremity: Secondary | ICD-10-CM | POA: Diagnosis not present

## 2024-04-22 DIAGNOSIS — X58XXXA Exposure to other specified factors, initial encounter: Secondary | ICD-10-CM | POA: Diagnosis not present

## 2024-04-22 DIAGNOSIS — I471 Supraventricular tachycardia, unspecified: Secondary | ICD-10-CM | POA: Diagnosis not present

## 2024-04-22 DIAGNOSIS — L89893 Pressure ulcer of other site, stage 3: Secondary | ICD-10-CM | POA: Diagnosis not present

## 2024-04-22 DIAGNOSIS — Z96612 Presence of left artificial shoulder joint: Secondary | ICD-10-CM | POA: Diagnosis not present

## 2024-04-22 DIAGNOSIS — R000 Tachycardia, unspecified: Secondary | ICD-10-CM | POA: Insufficient documentation

## 2024-04-22 DIAGNOSIS — I959 Hypotension, unspecified: Secondary | ICD-10-CM | POA: Diagnosis not present

## 2024-04-22 DIAGNOSIS — I10 Essential (primary) hypertension: Secondary | ICD-10-CM | POA: Diagnosis not present

## 2024-04-22 DIAGNOSIS — I517 Cardiomegaly: Secondary | ICD-10-CM | POA: Diagnosis not present

## 2024-04-22 DIAGNOSIS — Z91040 Latex allergy status: Secondary | ICD-10-CM | POA: Insufficient documentation

## 2024-04-22 DIAGNOSIS — R002 Palpitations: Secondary | ICD-10-CM | POA: Diagnosis present

## 2024-04-22 LAB — BASIC METABOLIC PANEL WITH GFR
Anion gap: 8 (ref 5–15)
BUN: 23 mg/dL (ref 8–23)
CO2: 24 mmol/L (ref 22–32)
Calcium: 9 mg/dL (ref 8.9–10.3)
Chloride: 101 mmol/L (ref 98–111)
Creatinine, Ser: 0.9 mg/dL (ref 0.44–1.00)
GFR, Estimated: >60 mL/min (ref >60)
Glucose, Bld: 129 mg/dL — ABNORMAL HIGH (ref 70–99)
Potassium: 4.3 mmol/L (ref 3.5–5.1)
Sodium: 133 mmol/L — ABNORMAL LOW (ref 135–145)

## 2024-04-22 LAB — CBC WITH DIFFERENTIAL/PLATELET
Abs Immature Granulocytes: 0.01 K/uL (ref 0.00–0.07)
Basophils Absolute: 0 K/uL (ref 0.0–0.1)
Basophils Relative: 1 %
Eosinophils Absolute: 0.1 K/uL (ref 0.0–0.5)
Eosinophils Relative: 2 %
HCT: 38.7 % (ref 36.0–46.0)
Hemoglobin: 12.9 g/dL (ref 12.0–15.0)
Immature Granulocytes: 0 %
Lymphocytes Relative: 19 %
Lymphs Abs: 1.3 K/uL (ref 0.7–4.0)
MCH: 32.1 pg (ref 26.0–34.0)
MCHC: 33.3 g/dL (ref 30.0–36.0)
MCV: 96.3 fL (ref 80.0–100.0)
Monocytes Absolute: 0.6 K/uL (ref 0.1–1.0)
Monocytes Relative: 10 %
Neutro Abs: 4.5 K/uL (ref 1.7–7.7)
Neutrophils Relative %: 68 %
Platelets: 293 K/uL (ref 150–400)
RBC: 4.02 MIL/uL (ref 3.87–5.11)
RDW: 13.3 % (ref 11.5–15.5)
WBC: 6.5 K/uL (ref 4.0–10.5)
nRBC: 0 % (ref 0.0–0.2)

## 2024-04-22 LAB — TSH: TSH: 2.924 u[IU]/mL (ref 0.350–4.500)

## 2024-04-22 LAB — D-DIMER, QUANTITATIVE: D-Dimer, Quant: 0.34 ug{FEU}/mL (ref 0.00–0.50)

## 2024-04-22 LAB — TROPONIN I (HIGH SENSITIVITY)
Troponin I (High Sensitivity): 10 ng/L (ref <18)
Troponin I (High Sensitivity): 25 ng/L — ABNORMAL HIGH (ref <18)

## 2024-04-22 LAB — T4, FREE: Free T4: 1.24 ng/dL — ABNORMAL HIGH (ref 0.61–1.12)

## 2024-04-22 MED ORDER — LACTATED RINGERS IV BOLUS
1000.0000 mL | Freq: Once | INTRAVENOUS | Status: AC
  Administered 2024-04-22: 1000 mL via INTRAVENOUS

## 2024-04-22 NOTE — Discharge Instructions (Addendum)
 You presented in SVT and spontaneously converted without intervention.  You are fluid resuscitated and your labs were overall reassuring.  Recommend that you resume your oral metoprolol at a lower dose of 12.5 mg daily (this is 1/2 tablet daily).  A referral has been placed for outpatient follow-up with a cardiologist.  Return for any uncontrolled heart rate or near syncope or syncopal episodes.

## 2024-04-22 NOTE — ED Provider Notes (Addendum)
 [place] EMERGENCY DEPARTMENT AT [facility] Provider Note   CSN: [id number] Arrival date & time: 04/22/24  1414     Patient presents with: No chief complaint on file.   [name] is a 89 y.o. female.   HPI    89 year old female with medical history significant for GERD, SVT on metoprolol, recent presentation to the emergency department yesterday for an episode of syncope who presents to the emergency department today with palpitations and chest pressure.  The patient was seen yesterday in the emergency department and had a workup for syncope in which she had fallen and struck her forehead.  CT had been negative at that time and laboratory workup has been reassuring.  She had been offered admission but declined admission and preferred discharge.  It was advised that she stop her metoprolol.  She had been for a long time managed on 12.5 mg a day but had been taking 25 mg.  She completely stopped her metoprolol and today presents in SVT.  She endorses chest tightness and palpitations, denies any other symptoms. Prior to Admission medications   Medication Sig Start Date End Date Taking? Authorizing Provider  Acetaminophen  (TYLENOL  EXTRA STRENGTH PO) Take by mouth.    [provider]  B Complex Vitamins (B-COMPLEX/B-12 PO) Take by mouth daily.    [provider]  betamethasone  dipropionate 0.05 % cream SMARTSIG:Sparingly Topical Twice Daily 10/20/21   [provider]  Biotin 10 MG TABS Take by mouth.    [provider]  Calcium  Citrate (CITRACAL PO) Take by mouth.    [provider]  diclofenac sodium (VOLTAREN) 1 % GEL  10/24/16   [provider]  estradiol  (VIVELLE -DOT) 0.0375 MG/24HR APPLY 1/2 PATCH TOPICALLY TO THE SKIN 2 TIMES A WEEK 12/24/23   Amundson C Silva, Brook E, MD  famotidine (PEPCID) 40 MG tablet Take 40 mg by mouth 2 (two) times daily as needed. 04/02/24   [provider]  HUMIRA, 2 PEN, 40 MG/0.4ML  pen SMARTSIG:40 Milligram(s) SUB-Q Every 2 Weeks 12/06/23   [provider]  hydroxychloroquine (PLAQUENIL) 200 MG tablet Take 200 mg by mouth daily. 08/25/19   [provider]  ipratropium (ATROVENT) 0.06 % nasal spray SMARTSIG:2 Spray(s) Both Nares 3 Times Daily PRN    [provider]  metoCLOPramide  (REGLAN ) 10 MG tablet Take 1 tablet (10 mg total) by mouth 2 (two) times daily. 01/28/24   Mannie Fairy DASEN, DO  metoprolol succinate (TOPROL-XL) 25 MG 24 hr tablet Takes 1/2 05/21/19   [provider]  Multiple Vitamin (MULTIVITAMIN ADULT) TABS     [provider]  nystatin (MYCOSTATIN) 100000 UNIT/ML suspension Take 4 mLs by mouth 4 (four) times daily. 04/16/24   [provider]  ondansetron  (ZOFRAN -ODT) 4 MG disintegrating tablet Take 1 tablet (4 mg total) by mouth every 8 (eight) hours as needed for nausea or vomiting. 01/26/24   Odell Balls, PA-C  OVER THE COUNTER MEDICATION Place 1 drop into both eyes 2 (two) times daily as needed (redness/ dry eyes). Over the counter eye drops    [provider]  oxyCODONE (OXY IR/ROXICODONE) 5 MG immediate release tablet Take 5 mg by mouth at bedtime as needed. 06/15/20   [provider]  pantoprazole  (PROTONIX ) 20 MG tablet Take 20 mg by mouth daily. 10/24/21   [provider]  RESTASIS 0.05 % ophthalmic emulsion INT 1 GTT INTO OU BID 11/12/18   [provider]  sodium polystyrene (SPS, SODIUM POLYSTYRENE  SULF,) 15 GM/60ML suspension Take 60 mLs (1 bottle)  by mouth every 12 (twelve) hours for 2 days. 01/03/24     valACYclovir (VALTREX) 1000 MG tablet as needed.  01/06/17   [provider]  zolpidem  (AMBIEN ) 10 MG tablet Take 5-10 mg by mouth at bedtime as needed. 02/13/22   [provider]    Allergies: Sulfa  antibiotics and Latex    Review of Systems  All other systems reviewed and are negative.   Updated Vital Signs BP 108/62   Pulse (!) 50   Temp 97.6  F (36.4 C) (Oral)   Resp (!) 22   Wt 47.6 kg   SpO2 100%   BMI 18.02 kg/m   Physical Exam Vitals and nursing note reviewed.  Constitutional:      General: She is not in acute distress.    Appearance: She is well-developed.  HENT:     Head: Normocephalic and atraumatic.  Eyes:     Conjunctiva/sclera: Conjunctivae normal.  Cardiovascular:     Rate and Rhythm: Regular rhythm. Tachycardia present.     Pulses: Normal pulses.  Pulmonary:     Effort: Pulmonary effort is normal. No respiratory distress.     Breath sounds: Normal breath sounds.  Abdominal:     Palpations: Abdomen is soft.     Tenderness: There is no abdominal tenderness.  Musculoskeletal:        General: No swelling.     Cervical back: Neck supple.  Skin:    General: Skin is warm and dry.     Capillary Refill: Capillary refill takes less than 2 seconds.  Neurological:     Mental Status: She is alert.  Psychiatric:        Mood and Affect: Mood normal.     (all labs ordered are listed, but only abnormal results are displayed) Labs Reviewed  BASIC METABOLIC PANEL WITH GFR - Abnormal; Notable for the following components:      Result Value   Sodium 133 (*)    Glucose, Bld 129 (*)    All other components within normal limits  CBC WITH DIFFERENTIAL/PLATELET  D-DIMER, QUANTITATIVE  TSH  T4, FREE  TROPONIN I (HIGH SENSITIVITY)  TROPONIN I (HIGH SENSITIVITY)    EKG: None   Radiology: DG Chest Portable 1 View Result Date: 04/22/2024 CLINICAL DATA:  Tachycardia.  Hypotension. EXAM: PORTABLE CHEST 1 VIEW COMPARISON:  09/18/2011 FINDINGS: Mildly enlarged cardiac silhouette. Clear lungs with normal vascularity. Interval left shoulder prosthesis. IMPRESSION: Mild cardiomegaly.  No acute abnormality. Electronically Signed   By: Elspeth Bathe M.D.   On: 04/22/2024 15:33   CT Head Wo Contrast Result Date: 04/21/2024 CLINICAL DATA:  Provided history: Fall. EXAM: CT HEAD WITHOUT CONTRAST CT CERVICAL SPINE WITHOUT  CONTRAST TECHNIQUE: Multidetector CT imaging of the head and cervical spine was performed following the standard protocol without intravenous contrast. Multiplanar CT image reconstructions of the cervical spine were also generated. RADIATION DOSE REDUCTION: This exam was performed according to the departmental dose-optimization program which includes automated exposure control, adjustment of the mA and/or kV according to patient size and/or use of iterative reconstruction technique. COMPARISON:  Head CT 01/26/2024.  Cervical spine CT 01/26/2024. FINDINGS: CT HEAD FINDINGS Brain: Generalized cerebral atrophy. Patchy and ill-defined hypoattenuation within the cerebral white matter, nonspecific but compatible with moderate chronic small vessel ischemic disease. There is no acute intracranial hemorrhage. No demarcated cortical infarct. No extra-axial fluid collection. No evidence of an intracranial mass. No midline shift. Vascular: No hyperdense  vessel. Atherosclerotic calcifications. Skull: No calvarial fracture or aggressive osseous lesion. Sinuses/Orbits: No mass or acute finding within the imaged orbits. No significant paranasal sinus disease at the imaged levels. Other: Left forehead soft tissue swelling. Trace fluid within bilateral mastoid air cells. CT CERVICAL SPINE FINDINGS Alignment: Nonspecific reversal of the expected cervical lordosis. Levocurvature of the cervical spine. 3 mm grade 1 anterolisthesis at C2-C3 and C7-T1. 2 mm T2-T3 grade 1 anterolisthesis. Skull base and vertebrae: The basion-dental and atlanto-dental intervals are maintained.No evidence of acute fracture to the cervical spine. C5-C6 vertebral ankylosis. Bilateral facet ankylosis at T2-T3 and T3-T4 Soft tissues and spinal canal: No prevertebral fluid or swelling. No visible canal hematoma. Subcentimeter nodule within the left thyroid  lobe not meeting consensus criteria for ultrasound follow-up based on size. No follow-up imaging  recommended. Reference: J Am Coll Radiol. 2015 Feb;12(2): 143-50. Disc levels: Cervical spondylosis with multilevel disc space narrowing, disc bulges/central disc protrusions, posterior disc osteophyte complexes, uncovertebral hypertrophy and facet arthropathy. At the non-fused levels, disc space narrowing is greatest at C3-C4, C4-C5, C6-C7 and T1-T2 (advanced at these levels). Multilevel spinal canal stenosis. Most notably at C4-C5 and C5-C6, posterior disc osteophyte complexes contribute to suspected moderate spinal canal stenosis. Multilevel bony neural foraminal narrowing. Multilevel ventral osteophytes. Degenerative changes also present at the C1-C2 articulation. Upper chest: No consolidation within the imaged lung apices. No visible pneumothorax. Emphysema. IMPRESSION: CT head: 1. No evidence of an acute intracranial abnormality. 2. Parenchymal atrophy and chronic small vessel ischemic disease. 3. Left forehead soft tissue swelling. 4. Trace fluid within bilateral mastoid air cells. CT cervical spine: 1. No evidence of acute cervical spine fracture. 2. Grade 1 anterolisthesis at C2-C3, C7-T1 and T2-T3, similar to the prior cervical spine CT of 01/26/2024. 3. Nonspecific reversal of the expected cervical lordosis. 4. Levocurvature of the cervical spine. 5. Cervical spondylosis as described. 6. C5-C6 vertebral ankylosis. 7. Bilateral facet ankylosis at T2-T3 and T3-T4. 8. Aortic Atherosclerosis (ICD10-I70.0). Electronically Signed   By: Rockey Childs D.O.   On: 04/21/2024 10:54   CT Cervical Spine Wo Contrast Result Date: 04/21/2024 CLINICAL DATA:  Provided history: Fall. EXAM: CT HEAD WITHOUT CONTRAST CT CERVICAL SPINE WITHOUT CONTRAST TECHNIQUE: Multidetector CT imaging of the head and cervical spine was performed following the standard protocol without intravenous contrast. Multiplanar CT image reconstructions of the cervical spine were also generated. RADIATION DOSE REDUCTION: This exam was performed  according to the departmental dose-optimization program which includes automated exposure control, adjustment of the mA and/or kV according to patient size and/or use of iterative reconstruction technique. COMPARISON:  Head CT 01/26/2024.  Cervical spine CT 01/26/2024. FINDINGS: CT HEAD FINDINGS Brain: Generalized cerebral atrophy. Patchy and ill-defined hypoattenuation within the cerebral white matter, nonspecific but compatible with moderate chronic small vessel ischemic disease. There is no acute intracranial hemorrhage. No demarcated cortical infarct. No extra-axial fluid collection. No evidence of an intracranial mass. No midline shift. Vascular: No hyperdense vessel. Atherosclerotic calcifications. Skull: No calvarial fracture or aggressive osseous lesion. Sinuses/Orbits: No mass or acute finding within the imaged orbits. No significant paranasal sinus disease at the imaged levels. Other: Left forehead soft tissue swelling. Trace fluid within bilateral mastoid air cells. CT CERVICAL SPINE FINDINGS Alignment: Nonspecific reversal of the expected cervical lordosis. Levocurvature of the cervical spine. 3 mm grade 1 anterolisthesis at C2-C3 and C7-T1. 2 mm T2-T3 grade 1 anterolisthesis. Skull base and vertebrae: The basion-dental and atlanto-dental intervals are maintained.No evidence of acute fracture to the cervical spine. C5-C6  vertebral ankylosis. Bilateral facet ankylosis at T2-T3 and T3-T4 Soft tissues and spinal canal: No prevertebral fluid or swelling. No visible canal hematoma. Subcentimeter nodule within the left thyroid  lobe not meeting consensus criteria for ultrasound follow-up based on size. No follow-up imaging recommended. Reference: J Am Coll Radiol. 2015 Feb;12(2): 143-50. Disc levels: Cervical spondylosis with multilevel disc space narrowing, disc bulges/central disc protrusions, posterior disc osteophyte complexes, uncovertebral hypertrophy and facet arthropathy. At the non-fused levels, disc  space narrowing is greatest at C3-C4, C4-C5, C6-C7 and T1-T2 (advanced at these levels). Multilevel spinal canal stenosis. Most notably at C4-C5 and C5-C6, posterior disc osteophyte complexes contribute to suspected moderate spinal canal stenosis. Multilevel bony neural foraminal narrowing. Multilevel ventral osteophytes. Degenerative changes also present at the C1-C2 articulation. Upper chest: No consolidation within the imaged lung apices. No visible pneumothorax. Emphysema. IMPRESSION: CT head: 1. No evidence of an acute intracranial abnormality. 2. Parenchymal atrophy and chronic small vessel ischemic disease. 3. Left forehead soft tissue swelling. 4. Trace fluid within bilateral mastoid air cells. CT cervical spine: 1. No evidence of acute cervical spine fracture. 2. Grade 1 anterolisthesis at C2-C3, C7-T1 and T2-T3, similar to the prior cervical spine CT of 01/26/2024. 3. Nonspecific reversal of the expected cervical lordosis. 4. Levocurvature of the cervical spine. 5. Cervical spondylosis as described. 6. C5-C6 vertebral ankylosis. 7. Bilateral facet ankylosis at T2-T3 and T3-T4. 8. Aortic Atherosclerosis (ICD10-I70.0). Electronically Signed   By: Rockey Childs D.O.   On: 04/21/2024 10:54     Procedures   Medications Ordered in the ED  lactated ringers  bolus 1,000 mL (1,000 mLs Intravenous New Bag/Given 04/22/24 1520)    Clinical Course as of 04/22/24 1713  Tue Apr 22, 2024  1710 DG Chest Portable 1 View [JL]    Clinical Course User Index [JL] Jerrol Agent, MD                                 Medical Decision Making Amount and/or Complexity of Data Reviewed Labs: ordered. Radiology: ordered. Decision-making details documented in ED Course.   89 year old female with medical history significant for GERD, SVT on metoprolol, recent presentation to the emergency department yesterday for an episode of syncope who presents to the emergency department today with palpitations and chest pressure.   The patient was seen yesterday in the emergency department and had a workup for syncope in which she had fallen and struck her forehead.  CT had been negative at that time and laboratory workup has been reassuring.  She had been offered admission but declined admission and preferred discharge.  It was advised that she stop her metoprolol.  She had been for a long time managed on 12.5 mg a day but had been taking 25 mg.  She completely stopped her metoprolol and today presents in SVT.  She endorses chest tightness and palpitations, denies any other symptoms.  On arrival, the patient was afebrile, notably in SVT, confirmed on EKG with SVT noted, heart rate 146, borderline rate related repolarization abnormality noted, no STEMI.  The patient was evaluated initially in triage, found to be in SVT and emergently bedded.  After the patient was bedded and placed on cardiac monitoring, the patient spontaneously converted to sinus rhythm and then subsequently sinus bradycardia.  She has not taken her metoprolol today as she had been holding it after her presentation to the Emergency Department yesterday.  Chest x-ray was performed which was  unremarkable for acute abnormality on the mild cardiomegaly noted.  No pulmonary edema.  Initial cardiac troponin was 10, TSH was normal, D-dimer negative, CBC without a leukocytosis or anemia, BMP without significant electrolyte abnormality, normal renal function.  The patient was administered a 1 L LR bolus.  Repeat assessment after multiple hours in the emergency department, the patient remained in sinus rhythm to sinus bradycardia on cardiac telemetry.  Her chest pressure has resolved with conversion out of SVT.  Considered admission for observation versus discharge, the patient strongly prefers discharge at this time.  She is pending a delta troponin at this time.  I advised that she restart her metoprolol at a lower dose, 1/2 tablet at 12.5 mg XL daily and to follow-up with  her PCP if needed, return to the emergency room in the event of any worsening symptoms of syncope or uncontrolled heart rate, outpatient referral to cardiology places patient currently does not have a cardiologist.  Plan at time of signout to discharge the patient pending results of delta troponin, signout given to Dr. Mannie at 1700.     Final diagnoses:  SVT (supraventricular tachycardia) [facility])    ED Discharge Orders          Ordered    Ambulatory referral to Cardiology       Comments: If you have not heard from the Cardiology office within the next 72 hours please call [phone].   04/22/24 1712               Jerrol Agent, MD 04/22/24 1715    Jerrol Agent, MD 04/22/24 [phone]

## 2024-04-22 NOTE — ED Triage Notes (Signed)
 Here by POV from home with husband, here from wound care center, sent for low BP, endorses transient visual changes, sob, feeling faint, and dizziness. Denies pain, NVD, fever, or HA. Recent syncope yesterday and subsequently seen at [facility] ED yesterday and d/c'd. Describes as episode at wound center. Alert, NAD, calm, interactive.

## 2024-04-22 NOTE — ED Provider Notes (Signed)
  Physical Exam  BP 108/62   Pulse (!) 50   Temp 97.6 F (36.4 C) (Oral)   Resp (!) 22   Wt 47.6 kg   SpO2 100%   BMI 18.02 kg/m   Physical Exam  Procedures  Procedures  ED Course / MDM   Clinical Course as of 04/22/24 1750  Tue Apr 22, 2024  1710 DG Chest Portable 1 View [JL]    Clinical Course User Index [JL] Jerrol Agent, MD   Medical Decision Making Amount and/or Complexity of Data Reviewed Labs: ordered. Radiology: ordered. Decision-making details documented in ED Course.   LILLETTE Fairy Gravely, assumed care for this patient.  In brief 89 year old female, syncopal episode yesterday, stopped her metoprolol and subsequently developed SVT today that did spontaneously convert.  Patient was a signout for delta troponin.  Patient's troponin did increase from 10-25.  She has not any chest pain.  Believe that this is likely a rate related rise given that the patient was in SVT for an extended period of time.  Patient was offered admission, she preferred outpatient follow-up with cardiology.  Return precautions were discussed with the patient and she was agreeable with this plan.       Gravely Fairy T, DO 04/22/24 1751

## 2024-04-22 NOTE — ED Notes (Signed)
 EDP at [facility]

## 2024-04-24 DIAGNOSIS — K2289 Other specified disease of esophagus: Secondary | ICD-10-CM | POA: Diagnosis not present

## 2024-04-24 DIAGNOSIS — B3781 Candidal esophagitis: Secondary | ICD-10-CM | POA: Diagnosis not present

## 2024-04-28 DIAGNOSIS — Z85828 Personal history of other malignant neoplasm of skin: Secondary | ICD-10-CM | POA: Diagnosis not present

## 2024-04-28 DIAGNOSIS — L03011 Cellulitis of right finger: Secondary | ICD-10-CM | POA: Diagnosis not present

## 2024-04-28 DIAGNOSIS — L820 Inflamed seborrheic keratosis: Secondary | ICD-10-CM | POA: Diagnosis not present

## 2024-05-01 DIAGNOSIS — R001 Bradycardia, unspecified: Secondary | ICD-10-CM | POA: Diagnosis not present

## 2024-05-01 DIAGNOSIS — E871 Hypo-osmolality and hyponatremia: Secondary | ICD-10-CM | POA: Diagnosis not present

## 2024-05-01 DIAGNOSIS — I10 Essential (primary) hypertension: Secondary | ICD-10-CM | POA: Diagnosis not present

## 2024-05-01 DIAGNOSIS — E559 Vitamin D deficiency, unspecified: Secondary | ICD-10-CM | POA: Diagnosis not present

## 2024-05-01 DIAGNOSIS — M0600 Rheumatoid arthritis without rheumatoid factor, unspecified site: Secondary | ICD-10-CM | POA: Diagnosis not present

## 2024-05-01 DIAGNOSIS — I471 Supraventricular tachycardia, unspecified: Secondary | ICD-10-CM | POA: Diagnosis not present

## 2024-05-05 DIAGNOSIS — M069 Rheumatoid arthritis, unspecified: Secondary | ICD-10-CM | POA: Diagnosis not present

## 2024-05-05 DIAGNOSIS — G894 Chronic pain syndrome: Secondary | ICD-10-CM | POA: Diagnosis not present

## 2024-05-05 DIAGNOSIS — Z79899 Other long term (current) drug therapy: Secondary | ICD-10-CM | POA: Diagnosis not present

## 2024-05-08 DIAGNOSIS — I471 Supraventricular tachycardia, unspecified: Secondary | ICD-10-CM | POA: Diagnosis not present

## 2024-05-08 DIAGNOSIS — Z87898 Personal history of other specified conditions: Secondary | ICD-10-CM | POA: Diagnosis not present

## 2024-05-22 DIAGNOSIS — I471 Supraventricular tachycardia, unspecified: Secondary | ICD-10-CM | POA: Diagnosis not present

## 2024-05-23 DIAGNOSIS — I471 Supraventricular tachycardia, unspecified: Secondary | ICD-10-CM | POA: Diagnosis not present

## 2024-07-09 DIAGNOSIS — H00011 Hordeolum externum right upper eyelid: Secondary | ICD-10-CM | POA: Diagnosis not present

## 2024-07-16 DIAGNOSIS — H0011 Chalazion right upper eyelid: Secondary | ICD-10-CM | POA: Diagnosis not present

## 2024-07-18 ENCOUNTER — Ambulatory Visit: Attending: Cardiology | Admitting: Cardiology

## 2024-07-18 ENCOUNTER — Telehealth: Payer: Self-pay | Admitting: Cardiology

## 2024-07-18 ENCOUNTER — Encounter: Payer: Self-pay | Admitting: Cardiology

## 2024-07-18 ENCOUNTER — Encounter: Payer: Self-pay | Admitting: *Deleted

## 2024-07-18 VITALS — BP 159/70 | HR 58 | Ht 62.0 in | Wt 104.6 lb

## 2024-07-18 DIAGNOSIS — R011 Cardiac murmur, unspecified: Secondary | ICD-10-CM

## 2024-07-18 DIAGNOSIS — I471 Supraventricular tachycardia, unspecified: Secondary | ICD-10-CM

## 2024-07-18 NOTE — Progress Notes (Signed)
 Cardiology Office Note:    Date:  07/18/2024   ID:  TERREE GAULTNEY, DOB Aug 14, 1933, MRN [id number]  PCP:  Creig Late, NP-C  Cardiologist:  None  Electrophysiologist:  None   Referring MD: Jerrol Agent, MD   Chief Complaint  Patient presents with   Tachycardia    History of Present Illness:    CLAUDEEN LEASON is a 89 y.o. female with a hx of hypertension, GERD, SVT who presents as an ED follow-up for SVT.  She was seen in the ED 04/2024 with SVT.  She had stopped taking her metoprolol.  Heart rate on EKG was 146.  She spontaneously converted to sinus rhythm.  She was discharged on metoprolol.  She reports she was dehydrated and went to ED and went into SVT while she was there.  States that she has episode of SVT every year or so.  Has always resolved spontaneously but has lasted up to 3.5 hours.  She denies any episodes since she went to the ED.  She denies any chest pain, dyspnea, lightheadedness, or syncope.  Reports BP in 130s when she checks at home.    Past Medical History:  Diagnosis Date   Arthritis    osteoarthritis   Diarrhea 08/28/2011   for the last 17 days; from samonella poisoning   GERD (gastroesophageal reflux disease)    Headache(784.0) 08/28/2011   just for the last few days; releated to dehydration   Hearing aid worn    Hypertension    Raynaud's phenomenon    Shortness of breath 08/28/2011   hard time breathing deeply because of the pain    Past Surgical History:  Procedure Laterality Date   BACK SURGERY  10/2004   spinal stenosis   CATARACT EXTRACTION W/ INTRAOCULAR LENS  IMPLANT, BILATERAL  Summer 2011   FACIAL COSMETIC SURGERY  ~ 2002   FINGER SURGERY  ~ 2000   titanium rod in right pointer; from arthritis   FOOT SURGERY Right    KNEE ARTHROSCOPY Right    4/23   SHOULDER SURGERY Left 03/2018   TONSILLECTOMY  1943   TUBAL LIGATION  1970's   VAGINAL HYSTERECTOMY  11/2002   still have my ovaries    Current Medications: Current Meds   Medication Sig   Acetaminophen  (TYLENOL  EXTRA STRENGTH PO) Take by mouth.   B Complex Vitamins (B-COMPLEX/B-12 PO) Take by mouth daily.   betamethasone  dipropionate 0.05 % cream SMARTSIG:Sparingly Topical Twice Daily   Biotin 10 MG TABS Take by mouth.   Calcium  Citrate (CITRACAL PO) Take by mouth.   diclofenac sodium (VOLTAREN) 1 % GEL    estradiol  (VIVELLE -DOT) 0.0375 MG/24HR APPLY 1/2 PATCH TOPICALLY TO THE SKIN 2 TIMES A WEEK   HUMIRA, 2 PEN, 40 MG/0.4ML pen SMARTSIG:40 Milligram(s) SUB-Q Every 2 Weeks   hydroxychloroquine (PLAQUENIL) 200 MG tablet Take 200 mg by mouth daily.   ipratropium (ATROVENT) 0.06 % nasal spray SMARTSIG:2 Spray(s) Both Nares 3 Times Daily PRN   metoprolol succinate (TOPROL-XL) 25 MG 24 hr tablet Takes 1/2   Multiple Vitamin (MULTIVITAMIN ADULT) TABS    OVER THE COUNTER MEDICATION Place 1 drop into both eyes 2 (two) times daily as needed (redness/ dry eyes). Over the counter eye drops   oxyCODONE (OXY IR/ROXICODONE) 5 MG immediate release tablet Take 5 mg by mouth at bedtime as needed.   RESTASIS 0.05 % ophthalmic emulsion INT 1 GTT INTO OU BID   valACYclovir (VALTREX) 1000 MG tablet as needed.    zolpidem  (  AMBIEN ) 10 MG tablet Take 5-10 mg by mouth at bedtime as needed.     Allergies:   Sulfa  antibiotics and Latex   Social History   Socioeconomic History   Marital status: Married    Spouse name: Not on file   Number of children: Not on file   Years of education: Not on file   Highest education level: Not on file  Occupational History   Not on file  Tobacco Use   Smoking status: Former    Current packs/day: 1.50    Average packs/day: 1.5 packs/day for 30.0 years (45.0 ttl pk-yrs)    Types: Cigarettes   Smokeless tobacco: Never   Tobacco comments:    quit 30 years go   Vaping Use   Vaping status: Never Used  Substance and Sexual Activity   Alcohol use: Yes    Alcohol/week: 7.0 standard drinks of alcohol    Types: 7 Standard drinks or equivalent  per week    Comment: Occ   Drug use: No   Sexual activity: Not Currently    Partners: Male    Birth control/protection: Surgical    Comment: hysterectomy, more than 5, IC after 18, no STD, no abnormal pap, no DES  Other Topics Concern   Not on file  Social History Narrative   Not on file   Social Drivers of Health   Financial Resource Strain: Not on file  Food Insecurity: Low Risk  (12/25/2023)   Received from [facility]   Hunger Vital Sign    Within the past 12 months, you worried that your food would run out before you got money to buy more: Never true    Within the past 12 months, the food you bought just didn't last and you didn't have money to get more. : Never true  Transportation Needs: No Transportation Needs (12/25/2023)   Received from [facility]    In the past 12 months, has lack of reliable transportation kept you from medical appointments, meetings, work or from getting things needed for daily living? : No  Physical Activity: Not on file  Stress: Not on file  Social Connections: Not on file     Family History: The patient's family history includes Cancer in her father.  ROS:   Please see the history of present illness.     All other systems reviewed and are negative.  EKGs/Labs/Other Studies Reviewed:    The following studies were reviewed today:   EKG:   07/18/2024: Sinus bradycardia, rate 58, low voltage, poor R wave progression  Recent Labs: 04/21/2024: ALT 21 04/22/2024: BUN 23; Creatinine, Ser 0.90; Hemoglobin 12.9; Platelets 293; Potassium 4.3; Sodium 133; TSH 2.924  Recent Lipid Panel No results found for: CHOL, TRIG, HDL, CHOLHDL, VLDL, LDLCALC, LDLDIRECT  Physical Exam:    VS:  BP (!) 159/70   Pulse (!) 58   Ht 5' 2 (1.575 m)   Wt 104 lb 9.6 oz (47.4 kg)   SpO2 96%   BMI 19.13 kg/m     Wt Readings from Last 3 Encounters:  07/18/24 104 lb 9.6 oz (47.4 kg)  04/22/24 105 lb (47.6 kg)  01/26/24 105 lb  (47.6 kg)    GEN:  Well nourished, well developed in no acute distress HEENT: Normal NECK: No JVD; No carotid bruits LYMPHATICS: No lymphadenopathy CARDIAC: Bradycardic, regular, 2 out of 6 systolic murmur RESPIRATORY:  Clear to auscultation without rales, wheezing or rhonchi  ABDOMEN: Soft, non-tender, non-distended MUSCULOSKELETAL:  No edema; No deformity  SKIN: Warm and dry NEUROLOGIC:  Alert and oriented x 3 PSYCHIATRIC:  Normal affect   ASSESSMENT:    1. Supraventricular tachycardia   2. Heart murmur    PLAN:    SVT: She was seen in the ED 04/2024 with SVT.  She had stopped taking her metoprolol.  Heart rate on EKG was 146.  She spontaneously converted to sinus rhythm.  She was discharged on metoprolol.  Reports has episode of SVT every 1 to 2 years. - Continue Toprol-XL 12.5 mg daily - Reports she wore Zio patch through PCP, will obtain records  Heart murmur: 2 out of 6 systolic murmur, likely aortic sclerosis versus mild stenosis.  Will check echocardiogram  RTC in 6 months   Medication Adjustments/Labs and Tests Ordered: Current medicines are reviewed at length with the patient today.  Concerns regarding medicines are outlined above.  Orders Placed This Encounter  Procedures   EKG 12-Lead   ECHOCARDIOGRAM COMPLETE   No orders of the defined types were placed in this encounter.   Patient Instructions  Medication Instructions:  Your physician recommends that you continue on your current medications as directed. Please refer to the Current Medication list given to you today.  *If you need a refill on your cardiac medications before your next appointment, please call your pharmacy*  Lab Work: none If you have labs (blood work) drawn today and your tests are completely normal, you will receive your results only by: MyChart Message (if you have MyChart) OR A paper copy in the mail If you have any lab test that is abnormal or we need to change your treatment, we will  call you to review the results.  Testing/Procedures: Echo  Your physician has requested that you have an echocardiogram. Echocardiography is a painless test that uses sound waves to create images of your heart. It provides your doctor with information about the size and shape of your heart and how well your heart's chambers and valves are working. This procedure takes approximately one hour. There are no restrictions for this procedure. Please do NOT wear cologne, perfume, aftershave, or lotions (deodorant is allowed). Please arrive 15 minutes prior to your appointment time.  Please note: We ask at that you not bring children with you during ultrasound (echo/ vascular) testing. Due to room size and safety concerns, children are not allowed in the ultrasound rooms during exams. Our front office staff cannot provide observation of children in our lobby area while testing is being conducted. An adult accompanying a patient to their appointment will only be allowed in the ultrasound room at the discretion of the ultrasound technician under special circumstances. We apologize for any inconvenience.   Follow-Up: At [facility], you and your health needs are our priority.  As part of our continuing mission to provide you with exceptional heart care, our providers are all part of one team.  This team includes your primary Cardiologist (physician) and Advanced Practice Providers or APPs (Physician Assistants and Nurse Practitioners) who all work together to provide you with the care you need, when you need it.  Your next appointment:   6 month  Provider:   Dr. Kate  We recommend signing up for the patient portal called MyChart.  Sign up information is provided on this After Visit Summary.  MyChart is used to connect with patients for Virtual Visits (Telemedicine).  Patients are able to view lab/test results, encounter notes, upcoming appointments, etc.  Non-urgent messages  can be sent to  your provider as well.   To learn more about what you can do with MyChart, go to [email or web address].   Other Instructions Please check blood pressure twice a day for week and send those reading          Signed, Lonni LITTIE Nanas, MD  07/18/2024 5:36 PM    [facility]

## 2024-07-18 NOTE — Patient Instructions (Addendum)
 Medication Instructions:  Your physician recommends that you continue on your current medications as directed. Please refer to the Current Medication list given to you today.  *If you need a refill on your cardiac medications before your next appointment, please call your pharmacy*  Lab Work: none If you have labs (blood work) drawn today and your tests are completely normal, you will receive your results only by: MyChart Message (if you have MyChart) OR A paper copy in the mail If you have any lab test that is abnormal or we need to change your treatment, we will call you to review the results.  Testing/Procedures: Echo  Your physician has requested that you have an echocardiogram. Echocardiography is a painless test that uses sound waves to create images of your heart. It provides your doctor with information about the size and shape of your heart and how well your heart's chambers and valves are working. This procedure takes approximately one hour. There are no restrictions for this procedure. Please do NOT wear cologne, perfume, aftershave, or lotions (deodorant is allowed). Please arrive 15 minutes prior to your appointment time.  Please note: We ask at that you not bring children with you during ultrasound (echo/ vascular) testing. Due to room size and safety concerns, children are not allowed in the ultrasound rooms during exams. Our front office staff cannot provide observation of children in our lobby area while testing is being conducted. An adult accompanying a patient to their appointment will only be allowed in the ultrasound room at the discretion of the ultrasound technician under special circumstances. We apologize for any inconvenience.   Follow-Up: At [facility], you and your health needs are our priority.  As part of our continuing mission to provide you with exceptional heart care, our providers are all part of one team.  This team includes your primary  Cardiologist (physician) and Advanced Practice Providers or APPs (Physician Assistants and Nurse Practitioners) who all work together to provide you with the care you need, when you need it.  Your next appointment:   6 month  Provider:   Dr. Kate  We recommend signing up for the patient portal called MyChart.  Sign up information is provided on this After Visit Summary.  MyChart is used to connect with patients for Virtual Visits (Telemedicine).  Patients are able to view lab/test results, encounter notes, upcoming appointments, etc.  Non-urgent messages can be sent to your provider as well.   To learn more about what you can do with MyChart, go to [email or web address].   Other Instructions Please check blood pressure twice a day for week and send those reading

## 2024-07-18 NOTE — Telephone Encounter (Signed)
 Returned patient called and made her aware that Zio results  per DR. Kate request will be requested from PCP at [facility]. Pt verbalized an understanding.

## 2024-07-18 NOTE — Telephone Encounter (Signed)
 Patient returned RN Latonia's call.

## 2024-07-22 NOTE — Telephone Encounter (Signed)
 Faxed sent to [facility] at [place] for ZIO results with as requested per Dr. Kate

## 2024-07-27 ENCOUNTER — Ambulatory Visit: Payer: Self-pay | Admitting: Cardiology

## 2024-07-30 DIAGNOSIS — H1789 Other corneal scars and opacities: Secondary | ICD-10-CM | POA: Diagnosis not present

## 2024-07-30 DIAGNOSIS — Z961 Presence of intraocular lens: Secondary | ICD-10-CM | POA: Diagnosis not present

## 2024-07-30 DIAGNOSIS — H52203 Unspecified astigmatism, bilateral: Secondary | ICD-10-CM | POA: Diagnosis not present

## 2024-07-30 DIAGNOSIS — Z79899 Other long term (current) drug therapy: Secondary | ICD-10-CM | POA: Diagnosis not present

## 2024-07-30 DIAGNOSIS — H5000 Unspecified esotropia: Secondary | ICD-10-CM | POA: Diagnosis not present

## 2024-07-30 DIAGNOSIS — H353131 Nonexudative age-related macular degeneration, bilateral, early dry stage: Secondary | ICD-10-CM | POA: Diagnosis not present

## 2024-08-01 DIAGNOSIS — M7061 Trochanteric bursitis, right hip: Secondary | ICD-10-CM | POA: Diagnosis not present

## 2024-08-04 DIAGNOSIS — Z79899 Other long term (current) drug therapy: Secondary | ICD-10-CM | POA: Diagnosis not present

## 2024-08-04 DIAGNOSIS — G894 Chronic pain syndrome: Secondary | ICD-10-CM | POA: Diagnosis not present

## 2024-08-04 DIAGNOSIS — M069 Rheumatoid arthritis, unspecified: Secondary | ICD-10-CM | POA: Diagnosis not present

## 2024-08-06 DIAGNOSIS — L821 Other seborrheic keratosis: Secondary | ICD-10-CM | POA: Diagnosis not present

## 2024-08-06 DIAGNOSIS — L03011 Cellulitis of right finger: Secondary | ICD-10-CM | POA: Diagnosis not present

## 2024-08-06 DIAGNOSIS — Z85828 Personal history of other malignant neoplasm of skin: Secondary | ICD-10-CM | POA: Diagnosis not present

## 2024-08-06 DIAGNOSIS — L603 Nail dystrophy: Secondary | ICD-10-CM | POA: Diagnosis not present

## 2024-08-13 DIAGNOSIS — H6123 Impacted cerumen, bilateral: Secondary | ICD-10-CM | POA: Diagnosis not present

## 2024-08-13 DIAGNOSIS — Z974 Presence of external hearing-aid: Secondary | ICD-10-CM | POA: Diagnosis not present

## 2024-08-15 DIAGNOSIS — M1711 Unilateral primary osteoarthritis, right knee: Secondary | ICD-10-CM | POA: Diagnosis not present

## 2024-08-28 DIAGNOSIS — I471 Supraventricular tachycardia, unspecified: Secondary | ICD-10-CM | POA: Diagnosis not present

## 2024-08-28 DIAGNOSIS — E7800 Pure hypercholesterolemia, unspecified: Secondary | ICD-10-CM | POA: Diagnosis not present

## 2024-08-28 DIAGNOSIS — R001 Bradycardia, unspecified: Secondary | ICD-10-CM | POA: Diagnosis not present

## 2024-08-28 DIAGNOSIS — I10 Essential (primary) hypertension: Secondary | ICD-10-CM | POA: Diagnosis not present

## 2024-08-28 DIAGNOSIS — E871 Hypo-osmolality and hyponatremia: Secondary | ICD-10-CM | POA: Diagnosis not present

## 2024-08-28 DIAGNOSIS — E559 Vitamin D deficiency, unspecified: Secondary | ICD-10-CM | POA: Diagnosis not present

## 2024-08-29 ENCOUNTER — Ambulatory Visit (HOSPITAL_COMMUNITY)
Admission: RE | Admit: 2024-08-29 | Discharge: 2024-08-29 | Disposition: A | Discharge status: Home / Self Care | Source: Ambulatory Visit | Attending: Cardiology | Admitting: Cardiology

## 2024-08-29 DIAGNOSIS — R011 Cardiac murmur, unspecified: Secondary | ICD-10-CM | POA: Diagnosis not present

## 2024-08-29 LAB — ECHOCARDIOGRAM COMPLETE
AR max vel: 1.79 cm2
AV Area VTI: 1.77 cm2
AV Area mean vel: 1.65 cm2
AV Mean grad: 10.6 mmHg
AV Peak grad: 19.5 mmHg
Ao pk vel: 2.21 m/s
Area-P 1/2: 2.45 cm2
P 1/2 time: 574 ms
S' Lateral: 2.3 cm

## 2024-09-01 ENCOUNTER — Ambulatory Visit: Payer: Self-pay | Admitting: Cardiology

## 2024-09-08 DIAGNOSIS — Z79899 Other long term (current) drug therapy: Secondary | ICD-10-CM | POA: Diagnosis not present

## 2024-09-08 DIAGNOSIS — K529 Noninfective gastroenteritis and colitis, unspecified: Secondary | ICD-10-CM | POA: Diagnosis not present

## 2024-09-08 DIAGNOSIS — Z681 Body mass index (BMI) 19 or less, adult: Secondary | ICD-10-CM | POA: Diagnosis not present

## 2024-09-08 DIAGNOSIS — L409 Psoriasis, unspecified: Secondary | ICD-10-CM | POA: Diagnosis not present

## 2024-09-08 DIAGNOSIS — M154 Erosive (osteo)arthritis: Secondary | ICD-10-CM | POA: Diagnosis not present

## 2024-09-08 DIAGNOSIS — R5383 Other fatigue: Secondary | ICD-10-CM | POA: Diagnosis not present

## 2024-09-08 DIAGNOSIS — M459 Ankylosing spondylitis of unspecified sites in spine: Secondary | ICD-10-CM | POA: Diagnosis not present

## 2024-09-16 ENCOUNTER — Encounter (HOSPITAL_BASED_OUTPATIENT_CLINIC_OR_DEPARTMENT_OTHER): Attending: Internal Medicine | Admitting: Internal Medicine

## 2024-09-16 DIAGNOSIS — T798XXA Other early complications of trauma, initial encounter: Secondary | ICD-10-CM | POA: Insufficient documentation

## 2024-09-16 DIAGNOSIS — I87312 Chronic venous hypertension (idiopathic) with ulcer of left lower extremity: Secondary | ICD-10-CM | POA: Insufficient documentation

## 2024-09-16 DIAGNOSIS — L97828 Non-pressure chronic ulcer of other part of left lower leg with other specified severity: Secondary | ICD-10-CM | POA: Diagnosis not present

## 2024-09-16 DIAGNOSIS — X58XXXA Exposure to other specified factors, initial encounter: Secondary | ICD-10-CM | POA: Diagnosis not present

## 2024-09-23 ENCOUNTER — Encounter (HOSPITAL_BASED_OUTPATIENT_CLINIC_OR_DEPARTMENT_OTHER): Admitting: Internal Medicine

## 2024-09-30 ENCOUNTER — Encounter (HOSPITAL_BASED_OUTPATIENT_CLINIC_OR_DEPARTMENT_OTHER): Admitting: Internal Medicine

## 2024-09-30 DIAGNOSIS — I87312 Chronic venous hypertension (idiopathic) with ulcer of left lower extremity: Secondary | ICD-10-CM | POA: Diagnosis not present

## 2024-09-30 DIAGNOSIS — L97828 Non-pressure chronic ulcer of other part of left lower leg with other specified severity: Secondary | ICD-10-CM

## 2024-09-30 DIAGNOSIS — T798XXA Other early complications of trauma, initial encounter: Secondary | ICD-10-CM | POA: Diagnosis not present

## 2024-12-30 ENCOUNTER — Encounter: Admitting: Obstetrics and Gynecology
